# Patient Record
Sex: Female | Born: 1950 | Race: Black or African American | Hispanic: No | Marital: Single | Smoking: Never smoker
Health system: Southern US, Community
[De-identification: ages and names within clinical notes are randomized; demographics above are authoritative.]

## PROBLEM LIST (undated history)

## (undated) DIAGNOSIS — Z923 Personal history of irradiation: Secondary | ICD-10-CM

## (undated) DIAGNOSIS — E7800 Pure hypercholesterolemia, unspecified: Secondary | ICD-10-CM

## (undated) DIAGNOSIS — I10 Essential (primary) hypertension: Secondary | ICD-10-CM

## (undated) DIAGNOSIS — D649 Anemia, unspecified: Secondary | ICD-10-CM

## (undated) DIAGNOSIS — M8580 Other specified disorders of bone density and structure, unspecified site: Secondary | ICD-10-CM

## (undated) DIAGNOSIS — Z803 Family history of malignant neoplasm of breast: Secondary | ICD-10-CM

## (undated) DIAGNOSIS — Z87898 Personal history of other specified conditions: Secondary | ICD-10-CM

## (undated) DIAGNOSIS — F419 Anxiety disorder, unspecified: Secondary | ICD-10-CM

## (undated) DIAGNOSIS — C801 Malignant (primary) neoplasm, unspecified: Secondary | ICD-10-CM

## (undated) DIAGNOSIS — M1990 Unspecified osteoarthritis, unspecified site: Secondary | ICD-10-CM

## (undated) DIAGNOSIS — N809 Endometriosis, unspecified: Secondary | ICD-10-CM

## (undated) DIAGNOSIS — T7840XA Allergy, unspecified, initial encounter: Secondary | ICD-10-CM

## (undated) DIAGNOSIS — K529 Noninfective gastroenteritis and colitis, unspecified: Secondary | ICD-10-CM

## (undated) DIAGNOSIS — IMO0002 Reserved for concepts with insufficient information to code with codable children: Secondary | ICD-10-CM

## (undated) DIAGNOSIS — R32 Unspecified urinary incontinence: Secondary | ICD-10-CM

## (undated) DIAGNOSIS — K219 Gastro-esophageal reflux disease without esophagitis: Secondary | ICD-10-CM

## (undated) HISTORY — DX: Endometriosis, unspecified: N80.9

## (undated) HISTORY — DX: Reserved for concepts with insufficient information to code with codable children: IMO0002

## (undated) HISTORY — DX: Gastro-esophageal reflux disease without esophagitis: K21.9

## (undated) HISTORY — DX: Noninfective gastroenteritis and colitis, unspecified: K52.9

## (undated) HISTORY — DX: Other specified disorders of bone density and structure, unspecified site: M85.80

## (undated) HISTORY — DX: Malignant (primary) neoplasm, unspecified: C80.1

## (undated) HISTORY — DX: Allergy, unspecified, initial encounter: T78.40XA

## (undated) HISTORY — DX: Anemia, unspecified: D64.9

## (undated) HISTORY — DX: Personal history of other specified conditions: Z87.898

## (undated) HISTORY — DX: Family history of malignant neoplasm of breast: Z80.3

## (undated) HISTORY — DX: Unspecified urinary incontinence: R32

## (undated) HISTORY — DX: Anxiety disorder, unspecified: F41.9

## (undated) HISTORY — PX: BILATERAL SALPINGOOPHORECTOMY: SHX1223

---

## 1978-11-11 HISTORY — PX: CERVIX LESION DESTRUCTION: SHX591

## 1979-07-13 DIAGNOSIS — Z87898 Personal history of other specified conditions: Secondary | ICD-10-CM

## 1979-07-13 HISTORY — DX: Personal history of other specified conditions: Z87.898

## 2005-06-06 ENCOUNTER — Emergency Department (HOSPITAL_COMMUNITY): Admission: EM | Admit: 2005-06-06 | Discharge: 2005-06-06 | Payer: Self-pay | Admitting: Emergency Medicine

## 2006-01-01 ENCOUNTER — Encounter: Admission: RE | Admit: 2006-01-01 | Discharge: 2006-01-01 | Payer: Self-pay | Admitting: Obstetrics and Gynecology

## 2006-01-16 ENCOUNTER — Other Ambulatory Visit: Admission: RE | Admit: 2006-01-16 | Discharge: 2006-01-16 | Payer: Self-pay | Admitting: Obstetrics and Gynecology

## 2006-03-10 ENCOUNTER — Encounter: Admission: RE | Admit: 2006-03-10 | Discharge: 2006-03-10 | Payer: Self-pay | Admitting: Family Medicine

## 2007-08-11 ENCOUNTER — Other Ambulatory Visit: Admission: RE | Admit: 2007-08-11 | Discharge: 2007-08-11 | Payer: Self-pay | Admitting: Family Medicine

## 2007-08-17 ENCOUNTER — Encounter: Admission: RE | Admit: 2007-08-17 | Discharge: 2007-08-17 | Payer: Self-pay | Admitting: Family Medicine

## 2007-12-10 ENCOUNTER — Encounter: Admission: RE | Admit: 2007-12-10 | Discharge: 2007-12-10 | Payer: Self-pay | Admitting: Family Medicine

## 2008-09-07 ENCOUNTER — Encounter: Admission: RE | Admit: 2008-09-07 | Discharge: 2008-09-07 | Payer: Self-pay | Admitting: Obstetrics and Gynecology

## 2008-09-11 HISTORY — PX: BLADDER SUSPENSION: SHX72

## 2008-09-11 HISTORY — PX: ABDOMINAL HYSTERECTOMY: SHX81

## 2008-09-14 ENCOUNTER — Encounter: Admission: RE | Admit: 2008-09-14 | Discharge: 2008-09-14 | Payer: Self-pay | Admitting: Obstetrics and Gynecology

## 2008-09-20 ENCOUNTER — Ambulatory Visit (HOSPITAL_COMMUNITY): Admission: RE | Admit: 2008-09-20 | Discharge: 2008-09-21 | Payer: Self-pay | Admitting: Obstetrics and Gynecology

## 2008-09-20 ENCOUNTER — Encounter (INDEPENDENT_AMBULATORY_CARE_PROVIDER_SITE_OTHER): Payer: Self-pay | Admitting: Obstetrics and Gynecology

## 2008-11-17 ENCOUNTER — Encounter: Admission: RE | Admit: 2008-11-17 | Discharge: 2008-11-17 | Payer: Self-pay | Admitting: Family Medicine

## 2008-12-01 ENCOUNTER — Ambulatory Visit: Payer: Self-pay | Admitting: Diagnostic Radiology

## 2008-12-01 ENCOUNTER — Emergency Department (HOSPITAL_BASED_OUTPATIENT_CLINIC_OR_DEPARTMENT_OTHER): Admission: EM | Admit: 2008-12-01 | Discharge: 2008-12-01 | Payer: Self-pay | Admitting: Emergency Medicine

## 2009-03-17 ENCOUNTER — Encounter: Admission: RE | Admit: 2009-03-17 | Discharge: 2009-03-17 | Payer: Self-pay | Admitting: Family Medicine

## 2009-03-27 ENCOUNTER — Emergency Department (HOSPITAL_COMMUNITY): Admission: EM | Admit: 2009-03-27 | Discharge: 2009-03-27 | Payer: Self-pay | Admitting: Emergency Medicine

## 2009-12-05 ENCOUNTER — Encounter: Admission: RE | Admit: 2009-12-05 | Discharge: 2009-12-05 | Payer: Self-pay | Admitting: Family Medicine

## 2010-11-11 LAB — HM COLONOSCOPY

## 2010-11-11 LAB — HM PAP SMEAR

## 2010-12-02 ENCOUNTER — Encounter: Payer: Self-pay | Admitting: Family Medicine

## 2011-01-15 ENCOUNTER — Emergency Department (HOSPITAL_COMMUNITY)
Admission: EM | Admit: 2011-01-15 | Discharge: 2011-01-15 | Disposition: A | Discharge status: Home / Self Care | Payer: Federal, State, Local not specified - PPO | Attending: Emergency Medicine | Admitting: Emergency Medicine

## 2011-01-15 DIAGNOSIS — R197 Diarrhea, unspecified: Secondary | ICD-10-CM | POA: Insufficient documentation

## 2011-01-15 DIAGNOSIS — R112 Nausea with vomiting, unspecified: Secondary | ICD-10-CM | POA: Insufficient documentation

## 2011-01-15 DIAGNOSIS — R42 Dizziness and giddiness: Secondary | ICD-10-CM | POA: Insufficient documentation

## 2011-01-15 LAB — CBC
HCT: 36.9 % (ref 36.0–46.0)
Hemoglobin: 11.2 g/dL — ABNORMAL LOW (ref 12.0–15.0)
MCH: 25.7 pg — ABNORMAL LOW (ref 26.0–34.0)
MCHC: 30.4 g/dL (ref 30.0–36.0)
MCV: 84.6 fL (ref 78.0–100.0)
Platelets: 218 10*3/uL (ref 150–400)
RBC: 4.36 MIL/uL (ref 3.87–5.11)
RDW: 15 % (ref 11.5–15.5)
WBC: 6.5 10*3/uL (ref 4.0–10.5)

## 2011-01-15 LAB — BASIC METABOLIC PANEL
BUN: 7 mg/dL (ref 6–23)
CO2: 29 mEq/L (ref 19–32)
Calcium: 8.8 mg/dL (ref 8.4–10.5)
Chloride: 103 mEq/L (ref 96–112)
Creatinine, Ser: 0.55 mg/dL (ref 0.4–1.2)
GFR calc Af Amer: >60 mL/min (ref >60)
GFR calc non Af Amer: >60 mL/min (ref >60)
Glucose, Bld: 101 mg/dL — ABNORMAL HIGH (ref 70–99)
Potassium: 4 mEq/L (ref 3.5–5.1)
Sodium: 137 mEq/L (ref 135–145)

## 2011-02-19 LAB — COMPREHENSIVE METABOLIC PANEL
ALT: 12 U/L (ref 0–35)
AST: 20 U/L (ref 0–37)
Albumin: 3.6 g/dL (ref 3.5–5.2)
Alkaline Phosphatase: 99 U/L (ref 39–117)
BUN: 5 mg/dL — ABNORMAL LOW (ref 6–23)
CO2: 31 mEq/L (ref 19–32)
Calcium: 9.6 mg/dL (ref 8.4–10.5)
Chloride: 105 mEq/L (ref 96–112)
Creatinine, Ser: 0.64 mg/dL (ref 0.4–1.2)
GFR calc Af Amer: >60 mL/min (ref >60)
GFR calc non Af Amer: >60 mL/min (ref >60)
Glucose, Bld: 98 mg/dL (ref 70–99)
Potassium: 4.2 mEq/L (ref 3.5–5.1)
Sodium: 142 mEq/L (ref 135–145)
Total Bilirubin: 0.3 mg/dL (ref 0.3–1.2)
Total Protein: 7.3 g/dL (ref 6.0–8.3)

## 2011-02-19 LAB — CBC
HCT: 35.3 % — ABNORMAL LOW (ref 36.0–46.0)
Hemoglobin: 11.6 g/dL — ABNORMAL LOW (ref 12.0–15.0)
MCHC: 33 g/dL (ref 30.0–36.0)
MCV: 82 fL (ref 78.0–100.0)
Platelets: 217 10*3/uL (ref 150–400)
RBC: 4.31 MIL/uL (ref 3.87–5.11)
RDW: 15.1 % (ref 11.5–15.5)
WBC: 4.4 10*3/uL (ref 4.0–10.5)

## 2011-02-19 LAB — POCT CARDIAC MARKERS
CKMB, poc: <1 ng/mL — ABNORMAL LOW (ref 1.0–8.0)
Myoglobin, poc: 52.5 ng/mL (ref 12–200)
Troponin i, poc: <0.05 ng/mL (ref 0.00–0.09)

## 2011-02-19 LAB — MAGNESIUM: Magnesium: 2.2 mg/dL (ref 1.5–2.5)

## 2011-02-19 LAB — LIPASE, BLOOD: Lipase: 28 U/L (ref 11–59)

## 2011-02-25 LAB — CBC
HCT: 37.5 % (ref 36.0–46.0)
Hemoglobin: 12.1 g/dL (ref 12.0–15.0)
MCHC: 32.1 g/dL (ref 30.0–36.0)
MCV: 83 fL (ref 78.0–100.0)
Platelets: 226 10*3/uL (ref 150–400)
RBC: 4.52 MIL/uL (ref 3.87–5.11)
RDW: 14.1 % (ref 11.5–15.5)
WBC: 6.5 10*3/uL (ref 4.0–10.5)

## 2011-02-25 LAB — DIFFERENTIAL
Basophils Absolute: 0.1 10*3/uL (ref 0.0–0.1)
Basophils Relative: 1 % (ref 0–1)
Eosinophils Absolute: 0.2 10*3/uL (ref 0.0–0.7)
Eosinophils Relative: 4 % (ref 0–5)
Lymphocytes Relative: 61 % — ABNORMAL HIGH (ref 12–46)
Lymphs Abs: 3.9 10*3/uL (ref 0.7–4.0)
Monocytes Absolute: 0.5 10*3/uL (ref 0.1–1.0)
Monocytes Relative: 7 % (ref 3–12)
Neutro Abs: 1.8 10*3/uL (ref 1.7–7.7)
Neutrophils Relative %: 28 % — ABNORMAL LOW (ref 43–77)

## 2011-02-25 LAB — BASIC METABOLIC PANEL
BUN: 10 mg/dL (ref 6–23)
CO2: 32 mEq/L (ref 19–32)
Calcium: 10 mg/dL (ref 8.4–10.5)
Chloride: 100 mEq/L (ref 96–112)
Creatinine, Ser: 0.7 mg/dL (ref 0.4–1.2)
GFR calc Af Amer: >60 mL/min (ref >60)
GFR calc non Af Amer: >60 mL/min (ref >60)
Glucose, Bld: 93 mg/dL (ref 70–99)
Potassium: 3.6 mEq/L (ref 3.5–5.1)
Sodium: 141 mEq/L (ref 135–145)

## 2011-02-25 LAB — POCT CARDIAC MARKERS
CKMB, poc: <1 ng/mL — ABNORMAL LOW (ref 1.0–8.0)
CKMB, poc: <1 ng/mL — ABNORMAL LOW (ref 1.0–8.0)
Myoglobin, poc: 21.6 ng/mL (ref 12–200)
Myoglobin, poc: 28.9 ng/mL (ref 12–200)
Troponin i, poc: <0.05 ng/mL (ref 0.00–0.09)
Troponin i, poc: <0.05 ng/mL (ref 0.00–0.09)

## 2011-02-25 LAB — D-DIMER, QUANTITATIVE: D-Dimer, Quant: 0.83 ug/mL-FEU — ABNORMAL HIGH (ref 0.00–0.48)

## 2011-03-26 NOTE — Op Note (Signed)
NAME:  Margaret [name], Margaret [name] NO.:  [id number]   MEDICAL RECORD NO.:  [id number]          PATIENT TYPE:  OIB   LOCATION:  9318                          FACILITY:  [facility]   PHYSICIAN:  Randye Lobo, M.D.   DATE OF BIRTH:  1950/11/22   DATE OF PROCEDURE:  09/20/2008  DATE OF DISCHARGE:                               OPERATIVE REPORT   PREOPERATIVE DIAGNOSIS:  Incomplete uterovaginal prolapse.   POSTOPERATIVE DIAGNOSIS:  Incomplete uterovaginal prolapse.   PROCEDURE:  Laparoscopically-assisted vaginal hysterectomy with  bilateral salpingo-oophorectomy and McCall culdoplasty.   SURGEON:  Randye Lobo, MD   ASSISTANT:  Gretchen Short, PA-C   ANESTHESIA:  General endotracheal, local with 0.25 Marcaine and 0.5%  lidocaine with epinephrine 1:200,000.   ESTIMATED BLOOD LOSS:  50 mL.   URINE OUTPUT:  Quantity sufficient.   COMPLICATIONS:  None.   INDICATIONS FOR THE PROCEDURE:  The patient is a 60 year old gravida 3,  para 3 African American female, status post bilateral tubal ligation,  who presents with a 3-year history of bladder prolapse and vaginal  heaviness.  The patient has had progressive problems with this prolapse  which was documentable over the last 3 years' time.  She has done  physical therapy which has not been adequate for her and she declines a  pessary use.  She has also consulted with Dr. Alfredo Martinez, who will  be performing an anterior colporrhaphy and a possible posterior  colporrhaphy.  The plan is made to proceed with a concurrent  hysterectomy with removal of tubes and ovaries.  A plan is now made to  proceed with a laparoscopically-assisted vaginal hysterectomy with  bilateral salpingo-oophorectomy and vaginal cuff suspension after risks,  benefits, and alternatives are reviewed.   FINDINGS:  Examination under anesthesia revealed a second-degree  cystocele.  There is first-degree uterine prolapse.  There is no  appreciable rectocele.   Laparoscopy demonstrated normal uterus, tubes, and ovaries.  There were  some small adhesions which were located in the left posterior cul-de-sac  and the left pelvic sidewall where the rectosigmoid colon was attached  with filmy adhesions.  The appendix and liver and bowel surfaces  appeared to be unremarkable.  The gallbladder was not visualized.  There  was no evidence of any adhesive disease in the upper abdomen.   The uterus, tubes, and ovaries were unremarkable with the exception of  evidence of a prior bilateral tubal ligation.   SPECIMENS:  The uterus, tubes, and ovaries were sent to pathology  together.   PROCEDURE:  The patient was re-identified in the preoperative hold area.  The patient did receive IV antibiotics prior to the procedure.  She did  receive both TED hose and PAS stockings for DVT prophylaxis.   In the operating room, general endotracheal anesthesia was induced and  the patient was then placed in the dorsal lithotomy position.  The  abdomen and vagina were sterilely prepped and draped.  A Foley catheter  was placed inside the bladder and left to gravity drainage throughout  the procedure.   The patient was noted to have evidence of cervical stenosis.  A small  dilator  could not pass through the cervical os, and a sponge stick was  therefore placed in the vagina during the hysterectomy portion of the  procedure.   The procedure began by making a 1-cm umbilical incision with the  scalpel.  The 10-mm trocar was inserted directly into the peritoneal  cavity without difficulty.  The laparoscope confirmed proper placement.  A CO2 pneumoperitoneum was achieved and the patient was then placed in  the Trendelenburg position.  The 5-mm incisions were created in the  right and left lower quadrants and 5-mm trocars were placed under direct  visualization of the laparoscope.   An inspection of the pelvic and abdominal organs was performed and the  findings were as  noted above.  Sharp dissection was used to take down  the adhesions of the left pelvic sidewall and this was performed without  difficulty.   The EndoSeal instrument by [employer] was then used to perform the  laparoscopic portion of the hysterectomy procedure.  The left ureter was  identified.  The left infundibulopelvic ligament was then cauterized 3  times sequentially prior to bisecting the pedicles.  The dissection was  then carried through the left broad ligament using the same instrument.  The left round ligament was cauterized and cut.  The dissection was  carried to the anterior leaf of the left broad ligament.  The same  procedure that was performed on the left-hand side was then repeated on  the right-hand side after the right ureter was identified.  All of the  pedicle sites were examined.  There was a small amount of bleeding noted  along the anterior leaves of the broad ligament on the patient's left-  hand side and this was cauterized again with the EndoSeal instrument.  There was satisfactory hemostasis.  The rest of the procedure was then  performed vaginally.   The patient was placed in the high Trendelenburg position.  The  tenaculum was placed on the anterior and posterior cervical lips.  The  cervix was circumferentially injected with 0.5% lidocaine with 1:200,000  of epinephrine.  The cervix was then scored with a scalpel.  The  posterior cul-de-sac was identified and the Mayo scissors was used to  enter the posterior cul-de-sac sharply.  This was performed without  difficulty.  Digital exam confirmed proper entry into this location.  A  long-weighted speculum was then placed inside the posterior cul-de-sac.  The uterosacral ligaments were sequentially clamped, sharply divided,  and suture ligated with transfixing sutures of 0 Vicryl.  The bladder  was dissected off of the anterior lower uterine segment.  The bladder  pillars were then each clamped, cut, and sharply  divided, and suture  ligated with 0 Vicryl bilaterally.  The anterior cul-de-sac was entered  sharply at this time and without difficulty.  The cardinal ligaments  were then sequentially clamped, sharply divided, and suture ligated with  0 Vicryl suture.  There were 2 bites which were taken along each of the  cardinal ligaments bilaterally.  The uterine fundus was then flipped and  the remaining broad ligament tissue on each side was clamped and the  specimen was sharply excised and sent to pathology.  The remaining  pedicles were then each tied with a free tie of 0 Vicryl followed by a  suture ligature of the same.   The posterior vaginal cuff was then whipstitched with a running locked  suture of 0 Vicryl to create good hemostasis.  There was a small amount  of bleeding noted along the right uterosacral ligament and a figure-of-  eight suture of 0 Vicryl was placed in this region which provided good  hemostasis.   At this time, the case was turned over to Dr. Alfredo Martinez, who  performed the anterior colporrhaphy and cystoscopy.  Please refer to  this dictation separately.   When Dr. Sherron Monday completed his portion of the procedure, I then  performed a McCall culdoplasty.  The suture of 0 Vicryl was brought  through the posterior cul-de-sac at the 6 o'clock position, down through  the distal left uterosacral ligament, across the posterior cul-de-sac in  a pursestring fashion, down to the distal right uterosacral ligament and  then out through the cul-de-sac and into the vagina again at the 6  o'clock position.   The vaginal cuff was then closed vertically with a running locked suture  of 0 Vicryl.  The McCall culdoplasty suture was tied and there was good  elevation and support of the posterior vaginal cuff.   Dr. Sherron Monday placed a vaginal packing and I then performed final  laparoscopy.  The CO2 pneumoperitoneum was re-achieved and the pelvis  was irrigated and suctioned.   All of the pedicle sites in the vaginal  cuff were re-examined and hemostasis was excellent.  The procedure was  therefore concluded.   The lower 5-mm trocars were removed under visualization of a  laparoscope.  Pneumoperitoneum was released, and the 10-mm laparoscope  and the umbilical trocar were removed simultaneously.   All skin incisions were closed with subcuticular sutures of 4-0 Vicryl  and Dermabond was placed over the abdominal incisions.   The skin incisions were all injected locally with 0.25% Marcaine.   This concluded the patient's procedure.  There were no complications.  All needle, instrument, and sponge counts were correct.  The patient was  escorted to the recovery room in stable and awake condition.      Randye Lobo, M.D.  Electronically Signed     BES/MEDQ  D:  09/20/2008  T:  09/21/2008  Job:  [id number]   cc:   Martina Sinner, MD  Fax: [phone]

## 2011-03-26 NOTE — Op Note (Signed)
NAME:  Margaret [name], WAHLER NO.:  [id number]   MEDICAL RECORD NO.:  [id number]          PATIENT TYPE:  OIB   LOCATION:  9318                          FACILITY:  [facility]   PHYSICIAN:  Martina Sinner, MD DATE OF BIRTH:  1950-12-23   DATE OF PROCEDURE:  09/20/2008  DATE OF DISCHARGE:                               OPERATIVE REPORT   PREOPERATIVE DIAGNOSIS:  Cystocele.   POSTOPERATIVE DIAGNOSIS:  Cystocele.   SURGERY:  Cystocele repair plus graft plus cystoscopy.   [name] has mild loss of uterine support, a mild rectocele,  and a small grade 3 or large grade 2 cystocele primarily with a central  defect.  These were the findings also under anesthesia.  It was obvious  that she did not require a rectocele repair at the beginning of the case  and also at the end of the case.   SURGEON:  Martina Sinner, MD   ASSISTANT:  Randye Lobo, MD   Dr. Edward Jolly performed the laparoscopically assisted transvaginal  hysterectomy and ran the posterior cuff with a running suture.  At this  point, I entered the room.  The uterosacral ligaments had been tagged.  She had been given preoperative antibiotics.  Preoperative laboratory  tests were normal.  Her leg position was good at the beginning of the  case.   I initially instilled 20 mL of lidocaine epinephrine mixture  submucosally to help with hemostasis in my dissection plane.  I used the  lidocaine all the way to pelvic sidewall.  I placed two Allis clamps at  the vaginal cuff anteriorly after inserting the weighted vaginal  speculum and dissected sharply using my lead Allis just to the proximal  urethra.  I dissected sharply and bluntly to the pelvic sidewall  bilaterally.  I was able to easily dissect the pubocervical fascia from  the vaginal wall nearly to the white line bilaterally.   I did anterior repair with a running 2-0 Vicryl not encroaching up on  the bladder neck.  I imbricated two layers of central  defect.   The patient was then cystoscoped with a 21-French scope.  She had  excellent blue jets bilaterally.  There is no distortion of anatomy.  There was nice reduction of the cystocele.   I dissected little bit further up, almost to the white line, but I did  not break through it.  I placed a 0-Vicryl on a CT-2 needle in the  direction of the ischial spine to the ileococcygeus muscle and also near  the urethrovesical angle.  I was very happy with the sturdiness of the  four sutures.  I tailored a 4 x 7 graft to approximately 5 x 3 graft.  It somewhat cut like a trapezoid.  It sewed in very nicely, not under  tension.  I then trimmed approximately 1 cm of anterior vaginal wall  bilaterally.  I closed the anterior vaginal wall with running 0-Vicryl  on a CT-1 needle.   Following this, Dr. Edward Jolly did a Rogelio Seen culdoplasty at the level of the  vaginal cuff.  She then closed the cuff.   At the end  of the case, there was very good reduction of her cystocele  with good vaginal length.  She had very minimal posterior defect.  Hemostasis was good.   Leg position was good.  Vaginal pack with Estrace cream was inserted.   Dr. Edward Jolly then checked laparoscopically the cuff to make certain there  was no bleeding.  The patient was taken to recovery room.  Hopefully,  this procedure will reach her treatment goal.           ______________________________  Martina Sinner, MD  Electronically Signed     SAM/MEDQ  D:  09/20/2008  T:  09/21/2008  Job:  [id number]

## 2011-08-13 LAB — BASIC METABOLIC PANEL
BUN: 6
BUN: 8
CO2: 27
CO2: 29
Calcium: 8.3 — ABNORMAL LOW
Calcium: 9.2
Chloride: 103
Chloride: 104
Creatinine, Ser: 0.62
Creatinine, Ser: 0.63
GFR calc Af Amer: >60
GFR calc Af Amer: >60
GFR calc non Af Amer: >60
GFR calc non Af Amer: >60
Glucose, Bld: 134 — ABNORMAL HIGH
Glucose, Bld: 95
Potassium: 3.5
Potassium: 4.1
Sodium: 135
Sodium: 139

## 2011-08-13 LAB — URINE MICROSCOPIC-ADD ON

## 2011-08-13 LAB — CBC
HCT: 30.2 — ABNORMAL LOW
HCT: 37.5
Hemoglobin: 11.9 — ABNORMAL LOW
Hemoglobin: 9.6 — ABNORMAL LOW
MCHC: 31.8
MCHC: 31.9
MCV: 83.2
MCV: 83.3
Platelets: 191
Platelets: 248
RBC: 3.63 — ABNORMAL LOW
RBC: 4.5
RDW: 14.8
RDW: 15.3
WBC: 5.5
WBC: 7.5

## 2011-08-13 LAB — URINALYSIS, ROUTINE W REFLEX MICROSCOPIC
Glucose, UA: NEGATIVE
Hgb urine dipstick: NEGATIVE
Ketones, ur: 15 — AB
Nitrite: NEGATIVE
Protein, ur: NEGATIVE
Specific Gravity, Urine: 1.025
Urobilinogen, UA: 0.2
pH: 6

## 2011-08-13 LAB — PROTIME-INR
INR: 1
Prothrombin Time: 13.1

## 2011-08-13 LAB — RENAL FUNCTION PANEL
Albumin: 3.1 — ABNORMAL LOW
BUN: 7
CO2: 28
Calcium: 8.3 — ABNORMAL LOW
Chloride: 97
Creatinine, Ser: 0.61
GFR calc Af Amer: >60
GFR calc non Af Amer: >60
Glucose, Bld: 112 — ABNORMAL HIGH
Phosphorus: 2.2 — ABNORMAL LOW
Potassium: 3.3 — ABNORMAL LOW
Sodium: 131 — ABNORMAL LOW

## 2011-08-13 LAB — TYPE AND SCREEN
ABO/RH(D): A POS
Antibody Screen: NEGATIVE

## 2011-08-13 LAB — ABO/RH: ABO/RH(D): A POS

## 2011-08-13 LAB — APTT: aPTT: 25

## 2011-11-12 LAB — HM DEXA SCAN

## 2012-02-19 ENCOUNTER — Ambulatory Visit: Payer: Federal, State, Local not specified - PPO | Attending: Family Medicine | Admitting: Physical Therapy

## 2012-02-19 DIAGNOSIS — M6281 Muscle weakness (generalized): Secondary | ICD-10-CM | POA: Insufficient documentation

## 2012-02-19 DIAGNOSIS — M25569 Pain in unspecified knee: Secondary | ICD-10-CM | POA: Insufficient documentation

## 2012-02-19 DIAGNOSIS — IMO0001 Reserved for inherently not codable concepts without codable children: Secondary | ICD-10-CM | POA: Insufficient documentation

## 2012-02-19 DIAGNOSIS — R609 Edema, unspecified: Secondary | ICD-10-CM | POA: Insufficient documentation

## 2012-02-19 DIAGNOSIS — R262 Difficulty in walking, not elsewhere classified: Secondary | ICD-10-CM | POA: Insufficient documentation

## 2012-02-26 ENCOUNTER — Ambulatory Visit: Payer: Federal, State, Local not specified - PPO | Admitting: Physical Therapy

## 2012-02-28 ENCOUNTER — Encounter: Payer: Federal, State, Local not specified - PPO | Admitting: Physical Therapy

## 2012-03-04 ENCOUNTER — Encounter: Payer: Federal, State, Local not specified - PPO | Admitting: Physical Therapy

## 2012-03-06 ENCOUNTER — Ambulatory Visit: Payer: Federal, State, Local not specified - PPO | Admitting: Physical Therapy

## 2012-03-10 ENCOUNTER — Ambulatory Visit: Payer: Federal, State, Local not specified - PPO | Admitting: Physical Therapy

## 2012-03-12 ENCOUNTER — Ambulatory Visit: Payer: Federal, State, Local not specified - PPO | Attending: Family Medicine | Admitting: Physical Therapy

## 2012-03-12 DIAGNOSIS — IMO0001 Reserved for inherently not codable concepts without codable children: Secondary | ICD-10-CM | POA: Insufficient documentation

## 2012-03-12 DIAGNOSIS — M6281 Muscle weakness (generalized): Secondary | ICD-10-CM | POA: Insufficient documentation

## 2012-03-12 DIAGNOSIS — R262 Difficulty in walking, not elsewhere classified: Secondary | ICD-10-CM | POA: Insufficient documentation

## 2012-03-12 DIAGNOSIS — R609 Edema, unspecified: Secondary | ICD-10-CM | POA: Insufficient documentation

## 2012-03-12 DIAGNOSIS — M25569 Pain in unspecified knee: Secondary | ICD-10-CM | POA: Insufficient documentation

## 2012-03-17 ENCOUNTER — Ambulatory Visit: Payer: Federal, State, Local not specified - PPO | Admitting: Physical Therapy

## 2012-03-20 ENCOUNTER — Ambulatory Visit: Payer: Federal, State, Local not specified - PPO | Admitting: Physical Therapy

## 2012-03-20 ENCOUNTER — Encounter: Payer: Federal, State, Local not specified - PPO | Admitting: Physical Therapy

## 2012-03-24 ENCOUNTER — Encounter: Payer: Federal, State, Local not specified - PPO | Admitting: Physical Therapy

## 2012-03-25 ENCOUNTER — Encounter: Payer: Federal, State, Local not specified - PPO | Admitting: Physical Therapy

## 2012-03-27 ENCOUNTER — Encounter: Payer: Federal, State, Local not specified - PPO | Admitting: Physical Therapy

## 2012-03-27 ENCOUNTER — Ambulatory Visit: Payer: Federal, State, Local not specified - PPO | Admitting: Physical Therapy

## 2012-04-01 ENCOUNTER — Ambulatory Visit: Payer: Federal, State, Local not specified - PPO | Admitting: Physical Therapy

## 2012-04-03 ENCOUNTER — Ambulatory Visit: Payer: Federal, State, Local not specified - PPO | Admitting: Physical Therapy

## 2012-04-08 ENCOUNTER — Ambulatory Visit: Payer: Federal, State, Local not specified - PPO | Admitting: Physical Therapy

## 2012-04-10 ENCOUNTER — Ambulatory Visit: Payer: Federal, State, Local not specified - PPO | Admitting: Physical Therapy

## 2012-07-16 ENCOUNTER — Ambulatory Visit (INDEPENDENT_AMBULATORY_CARE_PROVIDER_SITE_OTHER): Payer: Federal, State, Local not specified - PPO | Admitting: Orthopedic Surgery

## 2012-07-16 ENCOUNTER — Encounter: Payer: Self-pay | Admitting: Orthopedic Surgery

## 2012-07-16 VITALS — BP 140/80 | Ht 66.5 in | Wt 172.0 lb

## 2012-07-16 DIAGNOSIS — IMO0002 Reserved for concepts with insufficient information to code with codable children: Secondary | ICD-10-CM

## 2012-07-16 DIAGNOSIS — M1712 Unilateral primary osteoarthritis, left knee: Secondary | ICD-10-CM | POA: Insufficient documentation

## 2012-07-16 DIAGNOSIS — M1710 Unilateral primary osteoarthritis, unspecified knee: Secondary | ICD-10-CM

## 2012-07-16 NOTE — Patient Instructions (Addendum)
GLUCOSAMINE  CHONDROITIN   Chondroitin; Glucosamine tablets or capsules What is this medicine? CHONDROITIN; GLUCOSAMINE is a dietary supplement. It is promoted for its ability to reduce the symptoms of osteoarthritis by maintaining healthy joint cartilage. The FDA has not approved this supplement for any medical use. This medicine may be used for other purposes; ask your health care provider or pharmacist if you have questions. What should I tell my health care provider before I take this medicine? They need to know if you have any of these conditions: -diabetes -heart disease -kidney disease -liver disease -stomach or intestinal problems -an unusual or allergic reaction to chondroitin, glucosamine, sulfonamides, other medicines, foods, dyes, or preservatives -pregnant or trying to get pregnant -breast-feeding How should I use this medicine? Take this supplement by mouth with a glass of water. Follow the directions on the package labeling, or take as directed by your health care professional. Take your medicine at regular intervals. Do not take this supplement more often than directed. Talk to your pediatrician regarding the use of this medicine in children. Special care may be needed. Overdosage: If you think you have taken too much of this medicine contact a poison control center or emergency room at once. NOTE: This medicine is only for you. Do not share this medicine with others. What if I miss a dose? If you miss a dose, take it as soon as you can. If it is almost time for your next dose, take only that dose. Do not take double or extra doses. What may interact with this medicine? Check with your doctor or healthcare professional if you are taking any of the following medications: -warfarin This list may not describe all possible interactions. Give your health care provider a list of all the medicines, herbs, non-prescription drugs, or dietary supplements you use. Also tell them if  you smoke, drink alcohol, or use illegal drugs. Some items may interact with your medicine. What should I watch for while using this medicine? Tell your doctor or healthcare professional if your symptoms do not start to get better or if they get worse. This supplement may take several weeks to work for you. If you are scheduled for any medical or dental procedure, tell your healthcare provider that you are taking this supplement. You may need to stop taking this supplement before the procedure. This supplement may affect blood sugar levels. If you have diabetes, check with your doctor or health care professional before you change your diet or the dose of your diabetic medicine. Herbal or dietary supplements are not regulated like medicines. Rigid quality control standards are not required for dietary supplements. The purity and strength of these products can vary. The safety and effect of this dietary supplement for a certain disease or illness is not well known. This product is not intended to diagnose, treat, cure or prevent any disease. The Food and Drug Administration suggests the following to help consumers protect themselves: -Always read product labels and follow directions. -Natural does not mean a product is safe for humans to take. -Look for products that include USP after the ingredient name. This means that the manufacturer followed the standards of the [place] Pharmacopoeia. -Supplements made or sold by a nationally known food or drug company are more likely to be made under tight controls. You can write to the company for more information about how the product was made. What side effects may I notice from receiving this medicine? Side effects that you should report to your  doctor or health care professional as soon as possible: -allergic reactions like skin rash, itching or hives, swelling of the face, lips, or tongue -breathing problems -constipation -diarrhea -difficulty  sleeping -drowsiness -hair loss -headache -loss of appetite -stomach pain -swelling of the ankles or feet Side effects that usually do not require medical attention (report to your doctor or health care professional if they continue or are bothersome): -gas -nausea -upset stomach This list may not describe all possible side effects. Call your doctor for medical advice about side effects. You may report side effects to FDA at 1-800-FDA-1088. Where should I keep my medicine? Keep out of the reach of children. Store at room temperature between 15 and 30 degrees C (59 and 86 degrees F) or as directed on the package label. Protect from moisture. Throw away any unused supplement after the expiration date. NOTE: This sheet is a summary. It may not cover all possible information. If you have questions about this medicine, talk to your doctor, pharmacist, or health care provider.  2012, Elsevier/Gold Standard. (04/29/2006 6:23:00 PM)

## 2012-07-16 NOTE — Progress Notes (Signed)
Subjective:     Patient ID: Margaret Margaret [name], female   DOB: [birth year], 61 y.o.   MRN: [id number]  HPI Comments: The patient has transferred her care from the [facility]. They have given excellent care of reviewed their notes. She's had an injection of cortisone she's had an x-ray she's had an MRI which showed osteoarthritis primarily of the medial compartment.    Knee Pain  Incident onset: 6 months. There was no injury mechanism. The pain is present in the Margaret [name] knee. The pain is mild. The pain has been intermittent since onset. Associated symptoms include a loss of motion. Associated symptoms comments: Swelling.   History recorded and reviewed  Review of Systems Fatigue watering of the eyes occasional tingling in the lower extremity seasonal allergies all other systems were reviewed and were normal    Objective:   Physical Exam BP 140/80  Ht 5' 6.5" (1.689 m)  Wt 172 lb (78.019 kg)  BMI 27.35 kg/m2  Her overall appearance is normal she has a fairly normal body mass index. She is oriented x3 Her mood and affect are normal She is angulatory without assistive device and there is no evidence of antalgic gait  Her upper extremities are without deformity, without contracture, without subluxation, without atrophy or tremor and the skin is normal  Her lower extremities on the right normal range. Ligaments normal. No atrophy or tremor skin normal. No deformity.  Margaret [name] knee full range of motion, moderate medial joint line tenderness, negative McMurray sign. No deformity. No joint effusion. Ligament stable. Muscle strength and muscle tone normal. Skin normal.  Distal pulses are intact there is no lymphadenopathy reflexes were excellent sensation was normal and balance was normal  X-rays and MRI including reports have been included with the patient's data from the other group and I agree with their treatment I agree with the reports she has osteoarthritis and she  does not need surgery at this time      Assessment:     Osteoarthritis Margaret [name] knee  We discussed multiple treatment options and long-term history of osteoarthritis of the Margaret [name] knee. She will take cartilage supplements keep her weight under control. She will take over-the-counter anti-inflammatories. When her knee and cannot flex past 90 she will come in for aspiration and injection. We will x-ray her sometime in the spring and then every 6-12 months as deemed necessary depending on the progression of her disease.    Plan:     As stated above

## 2012-11-11 LAB — HM MAMMOGRAPHY

## 2012-11-27 ENCOUNTER — Other Ambulatory Visit: Payer: Self-pay | Admitting: Family Medicine

## 2012-11-27 DIAGNOSIS — Z1231 Encounter for screening mammogram for malignant neoplasm of breast: Secondary | ICD-10-CM

## 2012-12-25 ENCOUNTER — Ambulatory Visit: Payer: Federal, State, Local not specified - PPO

## 2013-01-22 ENCOUNTER — Ambulatory Visit: Payer: Federal, State, Local not specified - PPO

## 2013-02-12 ENCOUNTER — Ambulatory Visit: Payer: Federal, State, Local not specified - PPO

## 2013-02-15 ENCOUNTER — Ambulatory Visit: Payer: Federal, State, Local not specified - PPO | Attending: Orthopedic Surgery | Admitting: Physical Therapy

## 2013-02-15 DIAGNOSIS — M542 Cervicalgia: Secondary | ICD-10-CM | POA: Insufficient documentation

## 2013-02-15 DIAGNOSIS — M25519 Pain in unspecified shoulder: Secondary | ICD-10-CM | POA: Insufficient documentation

## 2013-02-15 DIAGNOSIS — IMO0001 Reserved for inherently not codable concepts without codable children: Secondary | ICD-10-CM | POA: Insufficient documentation

## 2013-02-17 ENCOUNTER — Ambulatory Visit: Payer: Federal, State, Local not specified - PPO | Admitting: Physical Therapy

## 2013-02-19 ENCOUNTER — Ambulatory Visit
Admission: RE | Admit: 2013-02-19 | Discharge: 2013-02-19 | Disposition: A | Discharge status: Home / Self Care | Payer: Federal, State, Local not specified - PPO | Source: Ambulatory Visit | Attending: Family Medicine | Admitting: Family Medicine

## 2013-02-19 DIAGNOSIS — Z1231 Encounter for screening mammogram for malignant neoplasm of breast: Secondary | ICD-10-CM

## 2013-02-23 ENCOUNTER — Ambulatory Visit: Payer: Federal, State, Local not specified - PPO | Admitting: Physical Therapy

## 2013-02-24 ENCOUNTER — Ambulatory Visit: Payer: Federal, State, Local not specified - PPO | Admitting: Orthopedic Surgery

## 2013-03-02 ENCOUNTER — Ambulatory Visit: Payer: Federal, State, Local not specified - PPO | Admitting: Physical Therapy

## 2013-03-04 ENCOUNTER — Ambulatory Visit: Payer: Federal, State, Local not specified - PPO | Admitting: Orthopedic Surgery

## 2013-03-05 ENCOUNTER — Ambulatory Visit: Payer: Federal, State, Local not specified - PPO | Admitting: Physical Therapy

## 2013-03-08 ENCOUNTER — Ambulatory Visit: Payer: Federal, State, Local not specified - PPO | Admitting: Physical Therapy

## 2013-03-12 ENCOUNTER — Ambulatory Visit: Payer: Federal, State, Local not specified - PPO | Admitting: Physical Therapy

## 2013-03-17 ENCOUNTER — Ambulatory Visit: Payer: Federal, State, Local not specified - PPO | Attending: Orthopedic Surgery | Admitting: Physical Therapy

## 2013-03-17 DIAGNOSIS — M542 Cervicalgia: Secondary | ICD-10-CM | POA: Insufficient documentation

## 2013-03-17 DIAGNOSIS — M25519 Pain in unspecified shoulder: Secondary | ICD-10-CM | POA: Insufficient documentation

## 2013-03-17 DIAGNOSIS — IMO0001 Reserved for inherently not codable concepts without codable children: Secondary | ICD-10-CM | POA: Insufficient documentation

## 2013-03-22 ENCOUNTER — Ambulatory Visit: Payer: Federal, State, Local not specified - PPO | Admitting: Physical Therapy

## 2013-03-23 ENCOUNTER — Ambulatory Visit (INDEPENDENT_AMBULATORY_CARE_PROVIDER_SITE_OTHER): Payer: Federal, State, Local not specified - PPO

## 2013-03-23 ENCOUNTER — Encounter: Payer: Self-pay | Admitting: Orthopedic Surgery

## 2013-03-23 ENCOUNTER — Ambulatory Visit (INDEPENDENT_AMBULATORY_CARE_PROVIDER_SITE_OTHER): Payer: Federal, State, Local not specified - PPO | Admitting: Orthopedic Surgery

## 2013-03-23 VITALS — BP 138/68 | Ht 66.5 in | Wt 172.0 lb

## 2013-03-23 DIAGNOSIS — M25562 Pain in left knee: Secondary | ICD-10-CM

## 2013-03-23 DIAGNOSIS — M1710 Unilateral primary osteoarthritis, unspecified knee: Secondary | ICD-10-CM

## 2013-03-23 DIAGNOSIS — M25569 Pain in unspecified knee: Secondary | ICD-10-CM

## 2013-03-23 DIAGNOSIS — IMO0002 Reserved for concepts with insufficient information to code with codable children: Secondary | ICD-10-CM

## 2013-03-23 DIAGNOSIS — M1712 Unilateral primary osteoarthritis, left knee: Secondary | ICD-10-CM

## 2013-03-23 NOTE — Progress Notes (Signed)
Patient ID: Margaret [name], female   DOB: [birth year], 62 y.o.   MRN: [id number] Chief Complaint  Patient presents with  . Follow-up    7 month recheck left knee    Followup x-ray status post evaluation for left knee with MRI and x-rays which showed mild early degenerative arthrosis and some subchondral bone edema in the proximal tibia of the left knee  The patient is doing well has some mild anterior knee pain bilaterally is questioning whether she can take supplements and or needs medication  She denies any GI reflux type disease does complain of some occasional constipation  General appearance is normal, the patient is alert and oriented x3 with normal mood and affect. BP 138/68  Ht 5' 6.5" (1.689 m)  Wt 172 lb (78.019 kg)  BMI 27.35 kg/m2  She is ambulatory without assistive device there is no limp her right knee shows some mild tenderness over the patellar tendon with no joint effusion full range of motion stability tests are normal muscle strength and tone are normal as well  Left knee looks normal as well she does have some tenderness over the patellar tendon no crepitance on the patellofemoral range of motion test. Overall knee range of motion is normal stability tests normal strength normal  X-ray left knee shows mild degenerative change over the medial compartment otherwise normal  Impression osteoarthritis plan over-the-counter medication at this time Aleve should be fine. We recommended glucosamine and chondroitin as well  Followup in a year for x-ray

## 2013-03-23 NOTE — Patient Instructions (Signed)
Chondroitin and glucosamin supplements   Over the counter aleve for pain

## 2013-03-29 ENCOUNTER — Ambulatory Visit: Payer: Federal, State, Local not specified - PPO | Admitting: Physical Therapy

## 2013-04-02 ENCOUNTER — Ambulatory Visit: Payer: Federal, State, Local not specified - PPO | Admitting: Physical Therapy

## 2013-05-07 ENCOUNTER — Encounter: Payer: Self-pay | Admitting: Obstetrics and Gynecology

## 2013-05-07 ENCOUNTER — Ambulatory Visit (INDEPENDENT_AMBULATORY_CARE_PROVIDER_SITE_OTHER): Payer: Federal, State, Local not specified - PPO | Admitting: Obstetrics and Gynecology

## 2013-05-07 VITALS — BP 122/84 | HR 68 | Ht 66.0 in | Wt 176.0 lb

## 2013-05-07 DIAGNOSIS — N952 Postmenopausal atrophic vaginitis: Secondary | ICD-10-CM

## 2013-05-07 NOTE — Patient Instructions (Signed)
Atrophic Vaginitis Atrophic vaginitis is a problem of low levels of estrogen in women. This problem can happen at any age. It is most common in women who have gone through menopause ("the change").  HOW WILL I KNOW IF I HAVE THIS PROBLEM? You may have:  Trouble with peeing (urinating), such as:  Going to the bathroom often.  A hard time holding your pee until you reach a bathroom.  Leaking pee.  Having pain when you pee.  Itching or a burning feeling.  Vaginal bleeding and spotting.  Pain during sex.  Dryness of the vagina.  A yellow, bad-smelling fluid (discharge) coming from the vagina. HOW WILL MY DOCTOR CHECK FOR THIS PROBLEM?  During your exam, your doctor will likely find the problem.  If there is a vaginal fluid, it may be checked for infection. HOW WILL THIS PROBLEM BE TREATED? Keep the vulvar skin as clean as possible. Moisturizers and lubricants can help with some of the symptoms. Estrogen replacement can help. There are 2 ways to take estrogen:  Systemic estrogen gets estrogen to your whole body. It takes many weeks or months before the symptoms get better.  You take an estrogen pill.  You use a skin patch. This is a patch that you put on your skin.  If you still have your uterus, your doctor may ask you to take a hormone. Talk to your doctor about the right medicine for you.  Estrogen cream.  This puts estrogen only at the part of your body where you apply it. The cream is put into the vagina or put on the vulvar skin. For some women, estrogen cream works faster than pills or the patch. CAN ALL WOMEN WITH THIS PROBLEM USE ESTROGEN? No. Women with certain types of cancer, liver problems, or problems with blood clots should not take estrogen. Your doctor can help you decide the best treatment for your symptoms. Document Released: 04/15/2008 Document Revised: 01/20/2012 Document Reviewed: 04/15/2008 [employer] Patient Information 2014 [employer].  

## 2013-05-07 NOTE — Progress Notes (Signed)
Patient ID: Margaret [name], female   DOB: [birth year], 62 y.o.   MRN: [id number]  61 y.o.   Divorced    [place]   female   [phone]   here for annual exam.   Status post TVH/BSO/TVT November 2009 with Dr. Edward Jolly doing the hysterectomy and Dr. Sherron Monday doing the anterior colporrhaphy  With a biological mesh and the TVT. Patient with vaginal pain and itching. Patient feels like there is a small pocket that is painful during intercourse.  Dr. Uvaldo Rising prescribed Estrace cream for patient. Feels breast sensitivity.  Some tinglingin the back of left left. Has a history of joint tap due to fluid on the left side.  No consistent leakage with cough, laugh or sneeze.  This is quite rare.   No pad use.  Has UTI currently, started Macrobid last week.  Feels better now.     No LMP recorded. Patient has had a hysterectomy.          Sexually active: yes  The current method of family planning is status post hysterectomy.    Exercising:  Last [facility]:  [facility] Last pap smear: History of abnormal pap:  Smoking: Alcohol: Last colonoscopy: Last Bone Density:   Last tetanus shot: Last cholesterol check:   Hgb:                Urine:   Family History  Problem Relation Age of Onset  . Arthritis    . Diabetes    . Diabetes Mother   . Hypertension Mother   . Thyroid disease Sister   . Seizures Brother   . Diabetes Brother   . Hypertension Brother   . Diabetes Brother     Patient Active Problem List   Diagnosis Date Noted  . Osteoarthritis of left knee 07/16/2012    Past Medical History  Diagnosis Date  . Anemia   . Dyspareunia   . Endometriosis   . Osteopenia   . Urinary incontinence   . Hx of abnormal Pap smear 1980's    Past Surgical History  Procedure Laterality Date  . Abdominal hysterectomy  09/2008    TVH/BSO--Dr. Edward Jolly with TVT  . Bladder suspension  09/2008    Dr. Edward Jolly  . Cervix lesion destruction  1980    hx abnormal  pap--dysplasia    Allergies: Ciprofloxacin and Sulfa antibiotics  Current Outpatient Prescriptions  Medication Sig Dispense Refill  . Cetirizine HCl (ZYRTEC PO) Take by mouth.      . Cholecalciferol (VITAMIN D PO) Take by mouth.      . estradiol (ESTRACE) 0.1 MG/GM vaginal cream Place 2 g vaginally 3 (three) times a week.      . nitrofurantoin, macrocrystal-monohydrate, (MACROBID) 100 MG capsule Take 100 mg by mouth 2 (two) times daily.       No current facility-administered medications for this visit.    ROS: Pertinent items are noted in HPI.  Social Hx:    Exam:    BP 122/84  Pulse 68  Ht 5\' 6"  (1.676 m)  Wt 176 lb (79.833 kg)  BMI 28.42 kg/m2   Wt Readings from Last 3 Encounters:  05/07/13 176 lb (79.833 kg)  03/23/13 172 lb (78.019 kg)  07/16/12 172 lb (78.019 kg)     Ht Readings from Last 3 Encounters:  05/07/13 5\' 6"  (1.676 m)  03/23/13 5' 6.5" (1.689 m)  07/16/12 5' 6.5" (1.689 m)     Neurologic: Grossly normal   Pelvic: External genitalia:  no lesions              Urethra:  normal appearing urethra with no masses, tenderness or lesions              Bartholins and Skenes: normal                 Vagina: normal appearing vagina with normal color and discharge, no lesions.  Excellent support.  No mesh or graft visible.              Cervix: absent                    Bimanual Exam:  Uterus:   Absent.  Anterior vaginal wall - outline of graft palpable, nontender to touch.                                      Adnexa:  no masses                                      Rectovaginal: Confirms                                      Anus:  normal sphincter tone, no lesions  A: Atrophic vaginitis Status post TVH Status post cystocele repair with biological graft, status post midurethral sling with permanent mesh Recent UTI.  Negative urine dip today.     P:  Continue vagina estrogen 2 grams twice a week at bedtime Discussed vaginal vitamin E suppositories twice a  week Finish abx Rx. Will follow up with Dr. Sherron Monday as well. I do not recommend removal of the graft at this time.  I believe that atrophy of the vagina is causing a significant increase in symptoms currently. Follow up in 4 - 6 weeks for recheck   An After Visit Summary was printed and given to the patient.

## 2013-05-13 ENCOUNTER — Ambulatory Visit (INDEPENDENT_AMBULATORY_CARE_PROVIDER_SITE_OTHER): Payer: Federal, State, Local not specified - PPO | Admitting: Family Medicine

## 2013-05-13 ENCOUNTER — Encounter: Payer: Self-pay | Admitting: Obstetrics and Gynecology

## 2013-05-13 VITALS — BP 180/98 | HR 96 | Temp 98.5°F | Resp 16 | Ht 66.5 in | Wt 172.0 lb

## 2013-05-13 DIAGNOSIS — L299 Pruritus, unspecified: Secondary | ICD-10-CM

## 2013-05-13 DIAGNOSIS — R209 Unspecified disturbances of skin sensation: Secondary | ICD-10-CM

## 2013-05-13 DIAGNOSIS — Z87440 Personal history of urinary (tract) infections: Secondary | ICD-10-CM

## 2013-05-13 DIAGNOSIS — D709 Neutropenia, unspecified: Secondary | ICD-10-CM

## 2013-05-13 DIAGNOSIS — R202 Paresthesia of skin: Secondary | ICD-10-CM

## 2013-05-13 LAB — POCT UA - MICROSCOPIC ONLY
Casts, Ur, LPF, POC: NEGATIVE
Crystals, Ur, HPF, POC: NEGATIVE
Mucus, UA: POSITIVE
Yeast, UA: NEGATIVE

## 2013-05-13 LAB — POCT URINALYSIS DIPSTICK
Bilirubin, UA: NEGATIVE
Blood, UA: NEGATIVE
Glucose, UA: NEGATIVE
Ketones, UA: NEGATIVE
Leukocytes, UA: NEGATIVE
Nitrite, UA: NEGATIVE
Protein, UA: NEGATIVE
Spec Grav, UA: 1.02
Urobilinogen, UA: 0.2
pH, UA: 6

## 2013-05-13 LAB — POCT CBC
Granulocyte percent: 32.3 %G — AB (ref 37–80)
HCT, POC: 41.9 % (ref 37.7–47.9)
Hemoglobin: 12.8 g/dL (ref 12.2–16.2)
Lymph, poc: 1.5 (ref 0.6–3.4)
MCH, POC: 26.4 pg — AB (ref 27–31.2)
MCHC: 30.5 g/dL — AB (ref 31.8–35.4)
MCV: 86.4 fL (ref 80–97)
MID (cbc): 0.3 (ref 0–0.9)
MPV: 8.3 fL (ref 0–99.8)
POC Granulocyte: 0.8 — AB (ref 2–6.9)
POC LYMPH PERCENT: 56.6 %L — AB (ref 10–50)
POC MID %: 11.1 %M (ref 0–12)
Platelet Count, POC: 228 10*3/uL (ref 142–424)
RBC: 4.85 M/uL (ref 4.04–5.48)
RDW, POC: 15.8 %
WBC: 2.6 10*3/uL — AB (ref 4.6–10.2)

## 2013-05-13 LAB — POCT SEDIMENTATION RATE: POCT SED RATE: 25 mm/hr — AB (ref 0–22)

## 2013-05-13 MED ORDER — CYPROHEPTADINE HCL 4 MG PO TABS: ORAL_TABLET | ORAL | Status: DC

## 2013-05-13 NOTE — Addendum Note (Signed)
Addended by: Sheppard Plumber A on: 05/13/2013 02:03 PM   Modules accepted: Orders

## 2013-05-13 NOTE — Patient Instructions (Signed)
Try Periactin if needed for itching and burning sensation.  Repeat CBC in one week  Return if worse or not improving

## 2013-05-13 NOTE — Progress Notes (Signed)
Subjective: The last several days patient has had a burning and itching sensation in her back and trunk. It has gone down into a feeling that way into her thighs. Little discomfort in her left leg. She feels like she has a rash but she doesn't. She's worried about shingles. She has not been under any undue stresses. Has not had this before. She has not any regular medicines changed lately. She took a hydroxyzine last night and felt like it made things worse.  Objective: Pleasant lady in no major distress. Skin of trunk looks fine. Chest is clear. Heart regular without murmurs. Abdomen soft nontender. No back tenderness. Back full range of motion. Straight leg raise test negative.  Assessment: Back itching and burning sensation, unexplained paresthesias Recent UTI  Plan: Check a UA, CBC, sedimentation rate, C. met on her. I do not know what is going on.  Results for orders placed in visit on 05/13/13  POCT CBC      Result Value Range   WBC 2.6 (*) 4.6 - 10.2 K/uL   Lymph, poc 1.5  0.6 - 3.4   POC LYMPH PERCENT 56.6 (*) 10 - 50 %L   MID (cbc) 0.3  0 - 0.9   POC MID % 11.1  0 - 12 %M   POC Granulocyte 0.8 (*) 2 - 6.9   Granulocyte percent 32.3 (*) 37 - 80 %G   RBC 4.85  4.04 - 5.48 M/uL   Hemoglobin 12.8  12.2 - 16.2 g/dL   HCT, POC 16.1  09.6 - 47.9 %   MCV 86.4  80 - 97 fL   MCH, POC 26.4 (*) 27 - 31.2 pg   MCHC 30.5 (*) 31.8 - 35.4 g/dL   RDW, POC 04.5     Platelet Count, POC 228  142 - 424 K/uL   MPV 8.3  0 - 99.8 fL  POCT UA - MICROSCOPIC ONLY      Result Value Range   WBC, Ur, HPF, POC 2-5     RBC, urine, microscopic 0-1     Bacteria, U Microscopic 1+     Mucus, UA positive     Epithelial cells, urine per micros 3-6     Crystals, Ur, HPF, POC neg     Casts, Ur, LPF, POC neg     Yeast, UA neg    POCT URINALYSIS DIPSTICK      Result Value Range   Color, UA yellow     Clarity, UA clear     Glucose, UA neg     Bilirubin, UA neg     Ketones, UA neg     Spec Grav, UA  1.020     Blood, UA neg     pH, UA 6.0     Protein, UA neg     Urobilinogen, UA 0.2     Nitrite, UA neg     Leukocytes, UA Negative     I do not know why she had a little bit of a low white blood count. Will repeat that in one week.  Try Periactin for the itching and burning sensation  Return if worse

## 2013-05-14 LAB — COMPREHENSIVE METABOLIC PANEL
ALT: 14 U/L (ref 0–35)
AST: 19 U/L (ref 0–37)
Albumin: 4.2 g/dL (ref 3.5–5.2)
Alkaline Phosphatase: 117 U/L (ref 39–117)
BUN: 8 mg/dL (ref 6–23)
CO2: 30 mEq/L (ref 19–32)
Calcium: 9.2 mg/dL (ref 8.4–10.5)
Chloride: 101 mEq/L (ref 96–112)
Creat: 0.69 mg/dL (ref 0.50–1.10)
Glucose, Bld: 90 mg/dL (ref 70–99)
Potassium: 3.8 mEq/L (ref 3.5–5.3)
Sodium: 137 mEq/L (ref 135–145)
Total Bilirubin: 0.3 mg/dL (ref 0.3–1.2)
Total Protein: 7.5 g/dL (ref 6.0–8.3)

## 2013-05-17 ENCOUNTER — Encounter: Payer: Self-pay | Admitting: Family Medicine

## 2013-05-21 ENCOUNTER — Other Ambulatory Visit (INDEPENDENT_AMBULATORY_CARE_PROVIDER_SITE_OTHER): Payer: Federal, State, Local not specified - PPO

## 2013-05-21 DIAGNOSIS — D709 Neutropenia, unspecified: Secondary | ICD-10-CM

## 2013-05-21 LAB — CBC
HCT: 36 % (ref 36.0–46.0)
Hemoglobin: 11.8 g/dL — ABNORMAL LOW (ref 12.0–15.0)
MCH: 25.3 pg — ABNORMAL LOW (ref 26.0–34.0)
MCHC: 32.8 g/dL (ref 30.0–36.0)
MCV: 77.1 fL — ABNORMAL LOW (ref 78.0–100.0)
Platelets: 217 10*3/uL (ref 150–400)
RBC: 4.67 MIL/uL (ref 3.87–5.11)
RDW: 15.8 % — ABNORMAL HIGH (ref 11.5–15.5)
WBC: 3.2 10*3/uL — ABNORMAL LOW (ref 4.0–10.5)

## 2013-06-11 ENCOUNTER — Ambulatory Visit: Payer: Self-pay | Admitting: Obstetrics and Gynecology

## 2013-06-21 ENCOUNTER — Ambulatory Visit: Payer: Federal, State, Local not specified - PPO

## 2013-06-21 ENCOUNTER — Ambulatory Visit (INDEPENDENT_AMBULATORY_CARE_PROVIDER_SITE_OTHER): Payer: Federal, State, Local not specified - PPO | Admitting: Family Medicine

## 2013-06-21 VITALS — BP 132/74 | HR 79 | Temp 97.8°F | Resp 18 | Ht 66.0 in | Wt 174.0 lb

## 2013-06-21 DIAGNOSIS — J302 Other seasonal allergic rhinitis: Secondary | ICD-10-CM

## 2013-06-21 DIAGNOSIS — R079 Chest pain, unspecified: Secondary | ICD-10-CM

## 2013-06-21 DIAGNOSIS — J309 Allergic rhinitis, unspecified: Secondary | ICD-10-CM

## 2013-06-21 DIAGNOSIS — R0602 Shortness of breath: Secondary | ICD-10-CM

## 2013-06-21 LAB — POCT CBC
Granulocyte percent: 32.2 % — AB (ref 37–80)
HCT, POC: 37.1 % — AB (ref 37.7–47.9)
Hemoglobin: 11.5 g/dL — AB (ref 12.2–16.2)
Lymph, poc: 2.6 (ref 0.6–3.4)
MCH, POC: 26 pg — AB (ref 27–31.2)
MCHC: 31 g/dL — AB (ref 31.8–35.4)
MCV: 83.8 fL (ref 80–97)
MID (cbc): 0.3 (ref 0–0.9)
MPV: 8.1 fL (ref 0–99.8)
POC Granulocyte: 1.4 — AB (ref 2–6.9)
POC LYMPH PERCENT: 60 % — AB (ref 10–50)
POC MID %: 7.8 % (ref 0–12)
Platelet Count, POC: 230 10*3/uL (ref 142–424)
RBC: 4.43 M/uL (ref 4.04–5.48)
RDW, POC: 15.6 %
WBC: 4.3 10*3/uL — AB (ref 4.6–10.2)

## 2013-06-21 MED ORDER — IPRATROPIUM BROMIDE 0.03 % NA SOLN: 2.0000 | Freq: Two times a day (BID) | NASAL | Status: DC

## 2013-06-21 NOTE — Progress Notes (Signed)
[facility]:  Office Visit  Chief Complaint:  Chief Complaint  Patient presents with  . Chest Pain    x2 days   . Follow-up    low wbc     HPI: Margaret [name] is a 62 y.o. female who complains of  Midsubsternal CP x 2 day, labored, she was in the house, thought it was indigestion, has been constant today,  She has been feeling "weird" this past week. She has allergies and throat has been bothering her, when she goes outside she feels her chest is heavy, she feels throat closes [place].  She denies full outright SOB but does feel like it, no wheezing. No history of asthma.  Currently just takes zyrtec intermittently, has flonase but has not tried it  She had some clamminess + tension HA , localized to frontal ha Denies abdominal pain, jaw pain, numbness, weakness or tingling Denies cough , sputum, fevers, chills, URI sxs Has had acid reflux in the past She has taken asa and aleve.  She deneis HTN, XOL, DM, she denies smoking   Past Medical History  Diagnosis Date  . Anemia   . Dyspareunia   . Endometriosis   . Osteopenia   . Urinary incontinence   . Hx of abnormal Pap smear 1980's  . Allergy   . Ulcer    Past Surgical History  Procedure Laterality Date  . Abdominal hysterectomy  09/2008    TVH/BSO--Dr. Edward Jolly with TVT  . Bladder suspension  09/2008    Dr. Edward Jolly  . Cervix lesion destruction  1980    hx abnormal pap--dysplasia   History   Social History  . Marital Status: Divorced    Spouse Name: N/A    Number of Children: N/A  . Years of Education: college   Social History Main Topics  . Smoking status: Never Smoker   . Smokeless tobacco: None  . Alcohol Use: Yes     Comment: occasional  . Drug Use: No  . Sexually Active: Yes    Birth Control/ Protection: Surgical     Comment: TAH/BSO   Other Topics Concern  . None   Social History Narrative  . None   Family History  Problem Relation Age of Onset  . Arthritis    . Diabetes     . Diabetes Mother   . Hypertension Mother   . Thyroid disease Sister   . Seizures Brother   . Diabetes Brother   . Hypertension Brother   . Diabetes Brother    Allergies  Allergen Reactions  . Ciprofloxacin   . Sulfa Antibiotics    Prior to Admission medications   Medication Sig Start Date End Date Taking? Authorizing Provider  Cetirizine HCl (ZYRTEC PO) Take by mouth.   Yes Historical Provider, MD  Cholecalciferol (VITAMIN D PO) Take by mouth.   Yes Historical Provider, MD  cyproheptadine (PERIACTIN) 4 MG tablet Take one half to one 3 times daily as needed for itching and burning sensation 05/13/13  Yes Peyton Najjar, MD  estradiol (ESTRACE) 0.1 MG/GM vaginal cream Place 2 g vaginally 3 (three) times a week.   Yes Historical Provider, MD  nitrofurantoin, macrocrystal-monohydrate, (MACROBID) 100 MG capsule Take 100 mg by mouth 2 (two) times daily.    Historical Provider, MD     ROS: The patient denies fevers, chills, night sweats, unintentional weight loss,  palpitations, wheezing, dyspnea on exertion, nausea, vomiting, abdominal pain, dysuria, hematuria, melena, numbness, weakness, or tingling.   All  other systems have been reviewed and were otherwise negative with the exception of those mentioned in the HPI and as above.    PHYSICAL EXAM: Filed Vitals:   06/21/13 1937  BP: 132/74  Pulse: 79  Temp: 97.8 F (36.6 C)  Resp: 18   Filed Vitals:   06/21/13 1937  Height: 5\' 6"  (1.676 m)  Weight: 174 lb (78.926 kg)   Body mass index is 28.1 kg/(m^2).  General: Alert, no acute distress HEENT:  Normocephalic, atraumatic, oropharynx patent. Tm nl, no sinus tenderness, + boggy nares, + nasal congestion Cardiovascular:  Regular rate and rhythm, no rubs murmurs or gallops.  No Carotid bruits, radial pulse intact. No pedal edema.  Respiratory: Clear to auscultation bilaterally.  No wheezes, rales, or rhonchi.  No cyanosis, no use of accessory musculature GI: No organomegaly,  abdomen is soft and non-tender, positive bowel sounds.  No masses. Skin: No rashes. Neurologic: Facial musculature symmetric. Psychiatric: Patient is appropriate throughout our interaction. Lymphatic: No cervical lymphadenopathy Musculoskeletal: Gait intact.   LABS: Results for orders placed in visit on 06/21/13  POCT CBC      Result Value Range   WBC 4.3 (*) 4.6 - 10.2 K/uL   Lymph, poc 2.6  0.6 - 3.4   POC LYMPH PERCENT 60.0 (*) 10 - 50 %L   MID (cbc) 0.3  0 - 0.9   POC MID % 7.8  0 - 12 %M   POC Granulocyte 1.4 (*) 2 - 6.9   Granulocyte percent 32.2 (*) 37 - 80 %G   RBC 4.43  4.04 - 5.48 M/uL   Hemoglobin 11.5 (*) 12.2 - 16.2 g/dL   HCT, POC 04.5 (*) 40.9 - 47.9 %   MCV 83.8  80 - 97 fL   MCH, POC 26.0 (*) 27 - 31.2 pg   MCHC 31.0 (*) 31.8 - 35.4 g/dL   RDW, POC 81.1     Platelet Count, POC 230  142 - 424 K/uL   MPV 8.1  0 - 99.8 fL     EKG/XRAY:   Primary read interpreted by Dr. Conley Rolls at [facility]. Normal chest  EKG NSR  ASSESSMENT/PLAN: Encounter Diagnoses  Name Primary?  . Chest pain Yes  . SOB (shortness of breath)   . Seasonal allergies   . Allergic rhinitis    CP not cardio or pulmonary related She has a hx of allergies. This may be all allergy related since she has sxs when she goes outside and/or exposed to allergen, she feels her chest gets tight and she has labored breathing I would consider eosniophilic esophagitis if she actually has difficulty swallowing but she does not.  Tropnin I pending Rx Atrovent NS for rhinorrhea, she should try her flonase first and if that does nto help then consider the atrovent Take allegra and Zyrtex PM and AM respectively I would consider a trial of singulair if the anithistamines do not work. F/u with allergist We discussed her CBC and how her numbers are looking better than they did in the past.  Go to ER prn   Damel Querry PHUONG, DO 06/21/2013 9:29 PM

## 2013-06-22 ENCOUNTER — Telehealth: Payer: Self-pay | Admitting: Family Medicine

## 2013-06-22 LAB — COMPREHENSIVE METABOLIC PANEL
ALT: 24 U/L (ref 0–35)
AST: 28 U/L (ref 0–37)
Albumin: 4.3 g/dL (ref 3.5–5.2)
Alkaline Phosphatase: 100 U/L (ref 39–117)
BUN: 11 mg/dL (ref 6–23)
CO2: 29 mEq/L (ref 19–32)
Calcium: 9.2 mg/dL (ref 8.4–10.5)
Chloride: 103 mEq/L (ref 96–112)
Creat: 0.74 mg/dL (ref 0.50–1.10)
Glucose, Bld: 78 mg/dL (ref 70–99)
Potassium: 3.8 mEq/L (ref 3.5–5.3)
Sodium: 138 mEq/L (ref 135–145)
Total Bilirubin: 0.4 mg/dL (ref 0.3–1.2)
Total Protein: 7.4 g/dL (ref 6.0–8.3)

## 2013-06-22 LAB — TROPONIN I: Troponin I: 0.02 ng/mL (ref <0.06)

## 2013-06-22 NOTE — Telephone Encounter (Signed)
Spoke with patient regardig labs. Sh is doing th esame, she went outside and had siumilar sxs. I advise her to do the trial of allegra, zyrtec and flonase consistently. See her allergist.

## 2013-07-14 ENCOUNTER — Ambulatory Visit: Payer: Federal, State, Local not specified - PPO | Admitting: Obstetrics and Gynecology

## 2013-07-26 ENCOUNTER — Other Ambulatory Visit: Payer: Self-pay | Admitting: Gastroenterology

## 2013-07-26 DIAGNOSIS — R1310 Dysphagia, unspecified: Secondary | ICD-10-CM

## 2013-07-30 ENCOUNTER — Ambulatory Visit
Admission: RE | Admit: 2013-07-30 | Discharge: 2013-07-30 | Disposition: A | Discharge status: Home / Self Care | Payer: Federal, State, Local not specified - PPO | Source: Ambulatory Visit | Attending: Gastroenterology | Admitting: Gastroenterology

## 2013-07-30 DIAGNOSIS — R1310 Dysphagia, unspecified: Secondary | ICD-10-CM

## 2013-09-02 ENCOUNTER — Other Ambulatory Visit: Payer: Self-pay | Admitting: Gastroenterology

## 2013-11-27 ENCOUNTER — Emergency Department (HOSPITAL_COMMUNITY)
Admission: EM | Admit: 2013-11-27 | Discharge: 2013-11-27 | Disposition: A | Discharge status: Home / Self Care | Payer: Federal, State, Local not specified - PPO | Attending: Emergency Medicine | Admitting: Emergency Medicine

## 2013-11-27 ENCOUNTER — Ambulatory Visit (INDEPENDENT_AMBULATORY_CARE_PROVIDER_SITE_OTHER): Payer: Federal, State, Local not specified - PPO | Admitting: Emergency Medicine

## 2013-11-27 ENCOUNTER — Emergency Department (HOSPITAL_COMMUNITY): Payer: Federal, State, Local not specified - PPO

## 2013-11-27 ENCOUNTER — Encounter (HOSPITAL_COMMUNITY): Payer: Self-pay | Admitting: Emergency Medicine

## 2013-11-27 VITALS — BP 162/62 | HR 88 | Temp 99.1°F | Resp 18 | Ht 65.5 in | Wt 172.0 lb

## 2013-11-27 DIAGNOSIS — IMO0002 Reserved for concepts with insufficient information to code with codable children: Secondary | ICD-10-CM | POA: Insufficient documentation

## 2013-11-27 DIAGNOSIS — Z8742 Personal history of other diseases of the female genital tract: Secondary | ICD-10-CM | POA: Insufficient documentation

## 2013-11-27 DIAGNOSIS — N809 Endometriosis, unspecified: Secondary | ICD-10-CM | POA: Insufficient documentation

## 2013-11-27 DIAGNOSIS — L98499 Non-pressure chronic ulcer of skin of other sites with unspecified severity: Secondary | ICD-10-CM | POA: Insufficient documentation

## 2013-11-27 DIAGNOSIS — D649 Anemia, unspecified: Secondary | ICD-10-CM | POA: Insufficient documentation

## 2013-11-27 DIAGNOSIS — M949 Disorder of cartilage, unspecified: Secondary | ICD-10-CM

## 2013-11-27 DIAGNOSIS — R079 Chest pain, unspecified: Secondary | ICD-10-CM

## 2013-11-27 DIAGNOSIS — Z882 Allergy status to sulfonamides status: Secondary | ICD-10-CM | POA: Insufficient documentation

## 2013-11-27 DIAGNOSIS — R0789 Other chest pain: Secondary | ICD-10-CM

## 2013-11-27 DIAGNOSIS — M899 Disorder of bone, unspecified: Secondary | ICD-10-CM | POA: Insufficient documentation

## 2013-11-27 DIAGNOSIS — I10 Essential (primary) hypertension: Secondary | ICD-10-CM

## 2013-11-27 DIAGNOSIS — Z881 Allergy status to other antibiotic agents status: Secondary | ICD-10-CM | POA: Insufficient documentation

## 2013-11-27 DIAGNOSIS — M25519 Pain in unspecified shoulder: Secondary | ICD-10-CM

## 2013-11-27 HISTORY — DX: Essential (primary) hypertension: I10

## 2013-11-27 LAB — URINALYSIS, ROUTINE W REFLEX MICROSCOPIC
Bilirubin Urine: NEGATIVE
Glucose, UA: NEGATIVE mg/dL
Hgb urine dipstick: NEGATIVE
Ketones, ur: NEGATIVE mg/dL
Leukocytes, UA: NEGATIVE
Nitrite: NEGATIVE
Protein, ur: NEGATIVE mg/dL
Specific Gravity, Urine: 1.006 (ref 1.005–1.030)
Urobilinogen, UA: 0.2 mg/dL (ref 0.0–1.0)
pH: 7 (ref 5.0–8.0)

## 2013-11-27 LAB — POCT I-STAT, CHEM 8
BUN: 8 mg/dL (ref 6–23)
Calcium, Ion: 1.24 mmol/L (ref 1.13–1.30)
Chloride: 103 mEq/L (ref 96–112)
Creatinine, Ser: 0.7 mg/dL (ref 0.50–1.10)
Glucose, Bld: 87 mg/dL (ref 70–99)
HCT: 38 % (ref 36.0–46.0)
Hemoglobin: 12.9 g/dL (ref 12.0–15.0)
Potassium: 4 mEq/L (ref 3.7–5.3)
Sodium: 142 mEq/L (ref 137–147)
TCO2: 28 mmol/L (ref 0–100)

## 2013-11-27 LAB — CBC WITH DIFFERENTIAL/PLATELET
Basophils Absolute: 0 10*3/uL (ref 0.0–0.1)
Basophils Relative: 0 % (ref 0–1)
Eosinophils Absolute: 0.1 10*3/uL (ref 0.0–0.7)
Eosinophils Relative: 3 % (ref 0–5)
HCT: 35.8 % — ABNORMAL LOW (ref 36.0–46.0)
Hemoglobin: 11.4 g/dL — ABNORMAL LOW (ref 12.0–15.0)
Lymphocytes Relative: 56 % — ABNORMAL HIGH (ref 12–46)
Lymphs Abs: 2 10*3/uL (ref 0.7–4.0)
MCH: 26.1 pg (ref 26.0–34.0)
MCHC: 31.8 g/dL (ref 30.0–36.0)
MCV: 82.1 fL (ref 78.0–100.0)
Monocytes Absolute: 0.4 10*3/uL (ref 0.1–1.0)
Monocytes Relative: 11 % (ref 3–12)
Neutro Abs: 1.1 10*3/uL — ABNORMAL LOW (ref 1.7–7.7)
Neutrophils Relative %: 30 % — ABNORMAL LOW (ref 43–77)
Platelets: 238 10*3/uL (ref 150–400)
RBC: 4.36 MIL/uL (ref 3.87–5.11)
RDW: 14.4 % (ref 11.5–15.5)
WBC: 3.6 10*3/uL — ABNORMAL LOW (ref 4.0–10.5)

## 2013-11-27 LAB — POCT I-STAT TROPONIN I: Troponin i, poc: 0 ng/mL (ref 0.00–0.08)

## 2013-11-27 NOTE — ED Notes (Signed)
Pt transported from [facility] with reported CP since Thursday. Pt reports a tightness in chest and SHOB . Pt took 324 of ASA today . Pt is Pain free on arrival to [facility].

## 2013-11-27 NOTE — Discharge Instructions (Signed)
Chest Pain (Nonspecific) Stop doxycycline. Contact Dr. Addison Lank in 3 days to arrange for an outpatient cardiac evaluation as soon as possible. Tell her office that you were seen here today. Return if your condition worsens for any reason. It is often hard to give a specific diagnosis for the cause of chest pain. There is always a chance that your pain could be related to something serious, such as a heart attack or a blood clot in the lungs. You need to follow up with your caregiver for further evaluation. CAUSES   Heartburn.  Pneumonia or bronchitis.  Anxiety or stress.  Inflammation around your heart (pericarditis) or lung (pleuritis or pleurisy).  A blood clot in the lung.  A collapsed lung (pneumothorax). It can develop suddenly on its own (spontaneous pneumothorax) or from injury (trauma) to the chest.  Shingles infection (herpes zoster virus). The chest wall is composed of bones, muscles, and cartilage. Any of these can be the source of the pain.  The bones can be bruised by injury.  The muscles or cartilage can be strained by coughing or overwork.  The cartilage can be affected by inflammation and become sore (costochondritis). DIAGNOSIS  Lab tests or other studies, such as X-rays, electrocardiography, stress testing, or cardiac imaging, may be needed to find the cause of your pain.  TREATMENT   Treatment depends on what may be causing your chest pain. Treatment may include:  Acid blockers for heartburn.  Anti-inflammatory medicine.  Pain medicine for inflammatory conditions.  Antibiotics if an infection is present.  You may be advised to change lifestyle habits. This includes stopping smoking and avoiding alcohol, caffeine, and chocolate.  You may be advised to keep your head raised (elevated) when sleeping. This reduces the chance of acid going backward from your stomach into your esophagus.  Most of the time, nonspecific chest pain will improve within 2 to 3 days  with rest and mild pain medicine. HOME CARE INSTRUCTIONS   If antibiotics were prescribed, take your antibiotics as directed. Finish them even if you start to feel better.  For the next few days, avoid physical activities that bring on chest pain. Continue physical activities as directed.  Do not smoke.  Avoid drinking alcohol.  Only take over-the-counter or prescription medicine for pain, discomfort, or fever as directed by your caregiver.  Follow your caregiver's suggestions for further testing if your chest pain does not go away.  Keep any follow-up appointments you made. If you do not go to an appointment, you could develop lasting (chronic) problems with pain. If there is any problem keeping an appointment, you must call to reschedule. SEEK MEDICAL CARE IF:   You think you are having problems from the medicine you are taking. Read your medicine instructions carefully.  Your chest pain does not go away, even after treatment.  You develop a rash with blisters on your chest. SEEK IMMEDIATE MEDICAL CARE IF:   You have increased chest pain or pain that spreads to your arm, neck, jaw, back, or abdomen.  You develop shortness of breath, an increasing cough, or you are coughing up blood.  You have severe back or abdominal pain, feel nauseous, or vomit.  You develop severe weakness, fainting, or chills.  You have a fever. THIS IS AN EMERGENCY. Do not wait to see if the pain will go away. Get medical help at once. Call your local emergency services (911 in [place] Do not drive yourself to the hospital. MAKE SURE YOU:   Understand  these instructions.  Will watch your condition.  Will get help right away if you are not doing well or get worse. Document Released: 08/07/2005 Document Revised: 01/20/2012 Document Reviewed: 06/02/2008 [facility] Patient Information 2014 [place].

## 2013-11-27 NOTE — Progress Notes (Addendum)
Subjective:   This chart was scribed for Margaret [name] MD by Forrestine Him, ED Scribe. This patient was seen in room Room 11 and the patient's care was started 8:48 AM. '   Patient ID: Margaret [name], female    DOB: July 30, 1951, 63 y.o.   MRN: [id number]  HPI HPI Comments: Margaret [name] is a 63 y.o. female who presents to [facility] complaining of gradual onset, intermittent, mild CP with mild radiation into her neck that initially started 2 days ago. She describes the pain as "tight", and states her first episode lasted for about 15-20 minutes. She says she experienced a second episode last night around midnight with similar symptoms. However, she states she also experienced pain into her shoulder and says it lasted a little longer. She says she took an Aleve at the onset of her symptoms with improvement for her shoulder pain. She denies any diaphoresis, SOB, nausea, or emesis at the onset of her CP episodes. She reports taking 2 baby aspirin this morning. She denies currently being a smoker. She states her mother and brother have a family history of HTN. She is currently on her second dose of doxycycline.  PCP: Dr. Cari Caraway   Review of Systems  Respiratory: Negative for shortness of breath.   Cardiovascular: Positive for chest pain.  Gastrointestinal: Negative for nausea and vomiting.    Past Medical History  Diagnosis Date  . Anemia   . Dyspareunia   . Endometriosis   . Osteopenia   . Urinary incontinence   . Hx of abnormal Pap smear 1980's  . Allergy   . Ulcer     History   Social History  . Marital Status: Divorced    Spouse Name: N/A    Number of Children: N/A  . Years of Education: college   Occupational History  . Not on file.   Social History Main Topics  . Smoking status: Never Smoker   . Smokeless tobacco: Not on file  . Alcohol Use: Yes     Comment: occasional  . Drug Use: No  . Sexual Activity: Yes    Birth Control/ Protection: Surgical   Comment: TAH/BSO   Other Topics Concern  . Not on file   Social History Narrative  . No narrative on file    Past Surgical History  Procedure Laterality Date  . Abdominal hysterectomy  09/2008    TVH/BSO--Dr. Quincy Simmonds with TVT  . Bladder suspension  09/2008    Dr. Quincy Simmonds  . Cervix lesion destruction  1980    hx abnormal pap--dysplasia    Triage Vitals: BP 162/62  Pulse 88  Temp(Src) 99.1 F (37.3 C) (Oral)  Resp 18  Ht 5' 5.5" (1.664 m)  Wt 172 lb (78.019 kg)  BMI 28.18 kg/m2  SpO2 99%   Objective:   Physical Exam CONSTITUTIONAL: Well developed/well nourished HEAD: Normocephalic/atraumatic EYES: EOMI/PERRL ENMT: Mucous membranes moist NECK: supple no meningeal signs SPINE:entire spine nontender CV: S1/S2 noted, no murmurs/rubs/gallops noted LUNGS: Lungs are clear to auscultation bilaterally, no apparent distress ABDOMEN: soft, nontender, no rebound or guarding GU:no cva tenderness NEURO: Pt is awake/alert, moves all extremitiesx4 EXTREMITIES: pulses normal, full ROM SKIN: warm, color normal PSYCH: no abnormalities of mood noted   EKG normal sinus rhythm there is about 1 mm of ST elevation in aVL very similar from previous Assessment & Plan:  Patient having intermittent episodes of chest tightness. This could be GI related since she is on doxycycline. The computer reading on her EKG  is normal but there is about 1 mm of ST elevation in aVL and her pressure is elevated in a  woman greater than  60.  (2) 81mg  Aspirin given to pt at 9 am

## 2013-11-27 NOTE — ED Provider Notes (Signed)
CSN: [id number]     Arrival date & time 11/27/13  1761 History   First MD Initiated Contact with Patient 11/27/13 1000     Chief Complaint  Patient presents with  . Chest Pain   (Consider location/radiation/quality/duration/timing/severity/associated sxs/prior Treatment) HPI Presents with anterior chest pressure onset 2 days ago. Initial episode lasted 10 minutes. She also had an episode yesterday lasting approximate 10 minutes at 2 AM today the episode awakened her from sleep. She treated herself with 2 baby aspirin with posturally. Presently discomfort is minimal. Nothing makes discomfort worse.exertional does not affect the discomfort. Symptoms improved after treatment she treated herself with 2 baby aspirins this morning no associated shortness of breath nausea or sweatiness. She was seen at [facility] urgent care center this morning sent here for further evaluation. Past Medical History  Diagnosis Date  . Anemia   . Dyspareunia   . Endometriosis   . Osteopenia   . Urinary incontinence   . Hx of abnormal Pap smear 1980's  . Allergy   . Ulcer    Past Surgical History  Procedure Laterality Date  . Abdominal hysterectomy  09/2008    TVH/BSO--Dr. Quincy Simmonds with TVT  . Bladder suspension  09/2008    Dr. Quincy Simmonds  . Cervix lesion destruction  1980    hx abnormal pap--dysplasia   Family History  Problem Relation Age of Onset  . Arthritis    . Diabetes    . Diabetes Mother   . Hypertension Mother   . Thyroid disease Sister   . Seizures Brother   . Diabetes Brother   . Hypertension Brother   . Diabetes Brother    History  Substance Use Topics  . Smoking status: Never Smoker   . Smokeless tobacco: Not on file  . Alcohol Use: Yes     Comment: occasional   OB History   Grav Para Term Preterm Abortions TAB SAB Ect Mult Living   3 3 3       3      Review of Systems  Constitutional: Negative.   HENT: Negative.   Respiratory: Negative.   Cardiovascular: Positive for chest pain.   Gastrointestinal: Negative.   Genitourinary: Positive for dysuria.       Patient had dysuria several days ago for which he was placed on doxycycline. Dysuria has resolved.  Musculoskeletal: Negative.   Skin: Negative.   Neurological: Negative.   Psychiatric/Behavioral: Negative.   All other systems reviewed and are negative.    Allergies  Ciprofloxacin and Sulfa antibiotics  Home Medications   Current Outpatient Rx  Name  Route  Sig  Dispense  Refill  . cetirizine (ZYRTEC) 10 MG tablet   Oral   Take 10 mg by mouth daily.         . Cholecalciferol (VITAMIN D) 2000 UNITS CAPS   Oral   Take 4,000 Units by mouth daily.         . cyproheptadine (PERIACTIN) 4 MG tablet      Take one half to one 3 times daily as needed for itching and burning sensation   30 tablet   0   . doxycycline (VIBRA-TABS) 100 MG tablet   Oral   Take 100 mg by mouth 2 (two) times daily. For 10 days. Started 11/19/13         . ipratropium (ATROVENT) 0.03 % nasal spray   Nasal   Place 2 sprays into the nose every 12 (twelve) hours.   30 mL   2   .  meloxicam (MOBIC) 7.5 MG tablet   Oral   Take 7.5 mg by mouth daily.          BP 168/95  Pulse 76  Temp(Src) 98.3 F (36.8 C) (Oral)  Resp 11  SpO2 100% Physical Exam  Nursing note and vitals reviewed. Constitutional: She appears well-developed and well-nourished.  HENT:  Head: Normocephalic and atraumatic.  Eyes: Conjunctivae are normal. Pupils are equal, round, and reactive to light.  Neck: Neck supple. No tracheal deviation present. No thyromegaly present.  Cardiovascular: Normal rate and regular rhythm.   No murmur heard. Pulmonary/Chest: Effort normal and breath sounds normal.  Abdominal: Soft. Bowel sounds are normal. She exhibits no distension. There is no tenderness.  Musculoskeletal: Normal range of motion. She exhibits no edema and no tenderness.  Neurological: She is alert. Coordination normal.  Skin: Skin is warm and dry.  No rash noted.  Psychiatric: She has a normal mood and affect.    ED Course  Procedures (including critical care time) Labs Review Labs Reviewed  CBC WITH DIFFERENTIAL  TROPONIN I  URINALYSIS, ROUTINE W REFLEX MICROSCOPIC   Imaging Review No results found.  EKG Interpretation   None       Date: 11/27/2013  Rate: 70  Rhythm: normal sinus rhythm  QRS Axis: normal  Intervals: normal  ST/T Wave abnormalities: early repolarization  Conduction Disutrbances:none  Narrative Interpretation:   Old EKG Reviewed: No significant change from 06/21/2013 interpreted by me Chest xray viewed by me Results for orders placed during the hospital encounter of 11/27/13  CBC WITH DIFFERENTIAL      Result Value Range   WBC 3.6 (*) 4.0 - 10.5 K/uL   RBC 4.36  3.87 - 5.11 MIL/uL   Hemoglobin 11.4 (*) 12.0 - 15.0 g/dL   HCT 35.8 (*) 36.0 - 46.0 %   MCV 82.1  78.0 - 100.0 fL   MCH 26.1  26.0 - 34.0 pg   MCHC 31.8  30.0 - 36.0 g/dL   RDW 14.4  11.5 - 15.5 %   Platelets 238  150 - 400 K/uL   Neutrophils Relative % 30 (*) 43 - 77 %   Neutro Abs 1.1 (*) 1.7 - 7.7 K/uL   Lymphocytes Relative 56 (*) 12 - 46 %   Lymphs Abs 2.0  0.7 - 4.0 K/uL   Monocytes Relative 11  3 - 12 %   Monocytes Absolute 0.4  0.1 - 1.0 K/uL   Eosinophils Relative 3  0 - 5 %   Eosinophils Absolute 0.1  0.0 - 0.7 K/uL   Basophils Relative 0  0 - 1 %   Basophils Absolute 0.0  0.0 - 0.1 K/uL  URINALYSIS, ROUTINE W REFLEX MICROSCOPIC      Result Value Range   Color, Urine YELLOW  YELLOW   APPearance CLEAR  CLEAR   Specific Gravity, Urine 1.006  1.005 - 1.030   pH 7.0  5.0 - 8.0   Glucose, UA NEGATIVE  NEGATIVE mg/dL   Hgb urine dipstick NEGATIVE  NEGATIVE   Bilirubin Urine NEGATIVE  NEGATIVE   Ketones, ur NEGATIVE  NEGATIVE mg/dL   Protein, ur NEGATIVE  NEGATIVE mg/dL   Urobilinogen, UA 0.2  0.0 - 1.0 mg/dL   Nitrite NEGATIVE  NEGATIVE   Leukocytes, UA NEGATIVE  NEGATIVE  POCT I-STAT, CHEM 8      Result Value Range    Sodium 142  137 - 147 mEq/L   Potassium 4.0  3.7 - 5.3 mEq/L  Chloride 103  96 - 112 mEq/L   BUN 8  6 - 23 mg/dL   Creatinine, Ser 0.70  0.50 - 1.10 mg/dL   Glucose, Bld 87  70 - 99 mg/dL   Calcium, Ion 1.24  1.13 - 1.30 mmol/L   TCO2 28  0 - 100 mmol/L   Hemoglobin 12.9  12.0 - 15.0 g/dL   HCT 38.0  36.0 - 46.0 %  POCT I-STAT TROPONIN I      Result Value Range   Troponin i, poc 0.00  0.00 - 0.08 ng/mL   Comment 3            Dg Chest Portable 1 View  11/27/2013   CLINICAL DATA:  Chest pressure behind the sternum for 3 days  EXAM: PORTABLE CHEST - 1 VIEW  COMPARISON:  06/21/2013  FINDINGS: The heart size and mediastinal contours are within normal limits. Both lungs are clear. The visualized skeletal structures are unremarkable.  IMPRESSION: No active disease.   Electronically Signed   By: Skipper Cliche M.D.   On: 11/27/2013 10:58    MDM  No diagnosis found. Pain felt to be atypical for acute coronary syndrome, nonexertional, nonacute EKG, negative troponin after several hours of symptoms. We will discontinue doxycycline as urine is clear patient no longer has urinary symptoms. Patient advised contact Dr.Ncneill in 3 days for followup and outpatient cardiac workup Diagnosis atypical chest pain    Orlie Dakin, MD 11/27/13 1308

## 2013-12-03 ENCOUNTER — Other Ambulatory Visit (HOSPITAL_COMMUNITY): Payer: Self-pay | Admitting: Family Medicine

## 2013-12-03 DIAGNOSIS — E0590 Thyrotoxicosis, unspecified without thyrotoxic crisis or storm: Secondary | ICD-10-CM

## 2013-12-27 ENCOUNTER — Encounter (HOSPITAL_COMMUNITY): Payer: Federal, State, Local not specified - PPO

## 2013-12-28 ENCOUNTER — Encounter (HOSPITAL_COMMUNITY): Payer: Federal, State, Local not specified - PPO

## 2014-02-03 ENCOUNTER — Other Ambulatory Visit: Payer: Self-pay

## 2014-02-03 DIAGNOSIS — Z1231 Encounter for screening mammogram for malignant neoplasm of breast: Secondary | ICD-10-CM

## 2014-03-04 ENCOUNTER — Ambulatory Visit: Payer: Federal, State, Local not specified - PPO

## 2014-03-22 ENCOUNTER — Ambulatory Visit: Payer: Federal, State, Local not specified - PPO | Admitting: Orthopedic Surgery

## 2014-03-25 ENCOUNTER — Ambulatory Visit
Admission: RE | Admit: 2014-03-25 | Discharge: 2014-03-25 | Disposition: A | Discharge status: Home / Self Care | Payer: Federal, State, Local not specified - PPO | Source: Ambulatory Visit

## 2014-03-25 ENCOUNTER — Encounter (INDEPENDENT_AMBULATORY_CARE_PROVIDER_SITE_OTHER): Payer: Self-pay

## 2014-03-25 DIAGNOSIS — Z1231 Encounter for screening mammogram for malignant neoplasm of breast: Secondary | ICD-10-CM

## 2014-03-29 ENCOUNTER — Other Ambulatory Visit: Payer: Self-pay | Admitting: Family Medicine

## 2014-03-29 DIAGNOSIS — R928 Other abnormal and inconclusive findings on diagnostic imaging of breast: Secondary | ICD-10-CM

## 2014-03-31 ENCOUNTER — Ambulatory Visit: Payer: Federal, State, Local not specified - PPO | Admitting: Orthopedic Surgery

## 2014-04-12 ENCOUNTER — Ambulatory Visit (INDEPENDENT_AMBULATORY_CARE_PROVIDER_SITE_OTHER): Payer: Federal, State, Local not specified - PPO

## 2014-04-12 ENCOUNTER — Encounter: Payer: Self-pay | Admitting: Orthopedic Surgery

## 2014-04-12 ENCOUNTER — Ambulatory Visit (INDEPENDENT_AMBULATORY_CARE_PROVIDER_SITE_OTHER): Payer: Federal, State, Local not specified - PPO | Admitting: Orthopedic Surgery

## 2014-04-12 VITALS — BP 172/93 | Ht 65.5 in | Wt 175.0 lb

## 2014-04-12 DIAGNOSIS — M549 Dorsalgia, unspecified: Secondary | ICD-10-CM

## 2014-04-12 DIAGNOSIS — M129 Arthropathy, unspecified: Secondary | ICD-10-CM

## 2014-04-12 DIAGNOSIS — M25569 Pain in unspecified knee: Secondary | ICD-10-CM | POA: Insufficient documentation

## 2014-04-12 DIAGNOSIS — M1990 Unspecified osteoarthritis, unspecified site: Secondary | ICD-10-CM | POA: Insufficient documentation

## 2014-04-12 MED ORDER — DICLOFENAC POTASSIUM 50 MG PO TABS: 50.0000 mg | ORAL_TABLET | Freq: Two times a day (BID) | ORAL | Status: DC

## 2014-04-12 NOTE — Patient Instructions (Signed)
Call to arrange therapy 

## 2014-04-12 NOTE — Progress Notes (Signed)
Patient ID: Margaret [name], female   DOB: Mar 04, 1951, 63 y.o.   MRN: [id number]  Chief Complaint  Patient presents with  . Follow-up    one year follow up left knee osteoarthritis    63-year-old female for reevaluation left knee pain and arthritis after x-rays show degenerative changes and MRI shows no meniscal tear  She is having some discomfort in her left knee at this time which is diffuse. She also has tingling and numbness in the anterior compartment of her left leg with radiating pain from her knee up towards her hip she denies any back pain. She says she sits a lot at work and it hurts and aches in the knee after long periods of sitting.  She denies any weakness in the limb at this time. There are no red flags on review of systems  BP 172/93  Ht 5' 5.5" (1.664 m)  Wt 175 lb (79.379 kg)  BMI 28.67 kg/m2 General appearance is normal, the patient is alert and oriented x3 with normal mood and affect. She still ambulates without assistive devices  Mild medial joint line tenderness in the left knee no effusion full range of motion ligaments are stable skin is warm dry and intact distal pulses are normal shows good reflexes at the ankle and knee straight leg raise is negative she has tenderness in her lower back and in the left buttock  Balance is normal  Impression.dx Encounter Diagnoses  Name Primary?  . Knee pain   . Arthritis   . Back pain Yes   New-onset back pain continued knee pain  Pertinent physical therapy for her back come back in 6 weeks  Recommend anti-inflammatories for her knee milligrams twice day diclofenac.  Meds ordered this encounter  Medications  . diclofenac (CATAFLAM) 50 MG tablet    Sig: Take 1 tablet (50 mg total) by mouth 2 (two) times daily.    Dispense:  90 tablet    Refill:  3    Orders Placed This Encounter  Procedures  . DG Knee AP/LAT W/Sunrise Left    Standing Status: Future     Number of Occurrences: 1     Standing Expiration  Date: 06/13/2015    Order Specific Question:  Reason for Exam (SYMPTOM  OR DIAGNOSIS REQUIRED)    Answer:  knee pain    Order Specific Question:  Is the patient pregnant?    Answer:  No    Order Specific Question:  Preferred imaging location?    Answer:  External  . Ambulatory referral to Physical Therapy    Referral Priority:  Routine    Referral Type:  Physical Medicine    Referral Reason:  Specialty Services Required    Requested Specialty:  Physical Therapy    Number of Visits Requested:  1

## 2014-04-22 ENCOUNTER — Ambulatory Visit: Payer: Federal, State, Local not specified - PPO | Attending: Orthopedic Surgery | Admitting: Physical Therapy

## 2014-04-22 DIAGNOSIS — M545 Low back pain, unspecified: Secondary | ICD-10-CM | POA: Insufficient documentation

## 2014-04-22 DIAGNOSIS — IMO0001 Reserved for inherently not codable concepts without codable children: Secondary | ICD-10-CM | POA: Insufficient documentation

## 2014-04-27 ENCOUNTER — Ambulatory Visit: Payer: Federal, State, Local not specified - PPO | Admitting: Physical Therapy

## 2014-04-28 ENCOUNTER — Encounter (INDEPENDENT_AMBULATORY_CARE_PROVIDER_SITE_OTHER): Payer: Self-pay

## 2014-04-28 ENCOUNTER — Ambulatory Visit
Admission: RE | Admit: 2014-04-28 | Discharge: 2014-04-28 | Disposition: A | Discharge status: Home / Self Care | Payer: Federal, State, Local not specified - PPO | Source: Ambulatory Visit | Attending: Family Medicine | Admitting: Family Medicine

## 2014-04-28 DIAGNOSIS — R928 Other abnormal and inconclusive findings on diagnostic imaging of breast: Secondary | ICD-10-CM

## 2014-05-02 ENCOUNTER — Ambulatory Visit: Payer: Federal, State, Local not specified - PPO | Admitting: Physical Therapy

## 2014-05-02 ENCOUNTER — Encounter: Payer: Federal, State, Local not specified - PPO | Admitting: Physical Therapy

## 2014-05-04 ENCOUNTER — Ambulatory Visit: Payer: Federal, State, Local not specified - PPO | Admitting: Physical Therapy

## 2014-05-06 ENCOUNTER — Encounter: Payer: Federal, State, Local not specified - PPO | Admitting: Physical Therapy

## 2014-05-06 ENCOUNTER — Ambulatory Visit: Payer: Federal, State, Local not specified - PPO | Admitting: Physical Therapy

## 2014-05-10 ENCOUNTER — Ambulatory Visit: Payer: Federal, State, Local not specified - PPO | Admitting: Physical Therapy

## 2014-05-17 ENCOUNTER — Ambulatory Visit: Payer: Federal, State, Local not specified - PPO | Attending: Orthopedic Surgery | Admitting: Physical Therapy

## 2014-05-17 DIAGNOSIS — IMO0001 Reserved for inherently not codable concepts without codable children: Secondary | ICD-10-CM | POA: Insufficient documentation

## 2014-05-17 DIAGNOSIS — M545 Low back pain, unspecified: Secondary | ICD-10-CM | POA: Insufficient documentation

## 2014-06-02 ENCOUNTER — Ambulatory Visit (INDEPENDENT_AMBULATORY_CARE_PROVIDER_SITE_OTHER): Payer: Federal, State, Local not specified - PPO | Admitting: Orthopedic Surgery

## 2014-06-02 VITALS — BP 152/87 | Ht 65.5 in | Wt 175.0 lb

## 2014-06-02 DIAGNOSIS — M545 Low back pain, unspecified: Secondary | ICD-10-CM

## 2014-06-02 DIAGNOSIS — M25569 Pain in unspecified knee: Secondary | ICD-10-CM

## 2014-06-02 DIAGNOSIS — M129 Arthropathy, unspecified: Secondary | ICD-10-CM

## 2014-06-02 DIAGNOSIS — M1990 Unspecified osteoarthritis, unspecified site: Secondary | ICD-10-CM

## 2014-06-02 DIAGNOSIS — M25562 Pain in left knee: Secondary | ICD-10-CM

## 2014-06-02 MED ORDER — OMEPRAZOLE 20 MG PO CPDR: 20.0000 mg | DELAYED_RELEASE_CAPSULE | Freq: Every day | ORAL | Status: DC

## 2014-06-02 NOTE — Progress Notes (Signed)
Subjective:     Patient ID: Margaret [name], female   DOB: [birth year], 63 y.o.   MRN: [id number]  HPI Chief Complaint  Patient presents with  . Follow-up    recheck back following therapy   The patient in physical therapy her lumbar spine and that seems to have improved along with her exercises at home. She finally got her chair to help with her back pain and it would be beneficial for her to get ergonomic keyboard and keyboard tray.  We discussed the ongoing problems with her knee she's had an injection, she's had anti-inflammatories. She's concerned about her history of ulcer long time ago she's not on any proton pump inhibitor she is on diclofenac.  Review of Systems Nothing other than stated    Objective:   Physical Exam No new findings on physical exam. 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

## 2014-06-02 NOTE — Patient Instructions (Signed)
Medication has been sent to your pharmacy Call us when you are ready to start series of injections (orthovisc)

## 2014-07-25 ENCOUNTER — Telehealth: Payer: Self-pay | Admitting: Orthopedic Surgery

## 2014-07-25 NOTE — Telephone Encounter (Signed)
ADVISED PATIENT, SHE WOULD LIKE TO SCHEDULE APPOINTMENT TO DISCUSS ORTHOVISC BEFORE I ORDER, PLEASE CALL HER WITH APPOINTMENT

## 2014-07-25 NOTE — Telephone Encounter (Signed)
Routing to dr harrison 

## 2014-07-25 NOTE — Telephone Encounter (Signed)
When she comes back from [place] she will call us if the knee is still hurting and we order Orthovisc injection       This is what I saw on her on the last note so  if she still having difficulty  order Orthovisc for her

## 2014-07-25 NOTE — Telephone Encounter (Signed)
Margaret [name] says for the last week her left knee has been hurting worse.  She is asking if you need to see her or do you think a knee sleeve or knee brace would help?  Please advise

## 2014-07-27 NOTE — Telephone Encounter (Signed)
Left a message to call to schedule appointment

## 2014-08-11 NOTE — Telephone Encounter (Signed)
No response from patient to messages left to offer appointment.

## 2014-09-12 ENCOUNTER — Encounter: Payer: Self-pay | Admitting: Orthopedic Surgery

## 2014-09-16 ENCOUNTER — Telehealth: Payer: Self-pay

## 2014-09-16 NOTE — Telephone Encounter (Signed)
Pt states she is having vaginal pain that has been going on for about a week and a half. She has set up an appt for 11/19 but canceled it due to her pain increasing. Pt would like a nurse to call her.

## 2014-09-16 NOTE — Telephone Encounter (Signed)
Spoke with patient. Patient states that she is having vaginal itching and irritation that is painful. States that it has progressively gotten worse over the last week. Patient has tried OTC Monistat with slight relief. Advised patient will need to be seen for evaluation. Patient is agreeable. Patient is agreeable to seeing a NP if Dr.Silva is available in the office if anything is needed. Appointment scheduled for Monday 11/9 at 2:30pm with Regina Eck CNM. Patient is agreeable to date and time. Advised patient to use OTC hydrocortisone ointment and Aveeno Sitz bath for relief of symptoms until appointment. Patient is agreeable. Patient will call back if she would like an earlier appointment on Monday.  Routing to provider for final review. Patient agreeable to disposition. Will close encounter

## 2014-09-16 NOTE — Telephone Encounter (Signed)
Spoke with patient. Patient states that she thought she had a yeast infection which she treated with OTC Monistat. Patient had slight relief with treatment. Patient's phone disconnected. Returned call to patient. No answer. Left message to call [place] at [phone].

## 2014-09-19 ENCOUNTER — Encounter: Payer: Self-pay | Admitting: Certified Nurse Midwife

## 2014-09-19 ENCOUNTER — Ambulatory Visit: Payer: Federal, State, Local not specified - PPO | Admitting: Certified Nurse Midwife

## 2014-09-23 ENCOUNTER — Telehealth: Payer: Self-pay | Admitting: Orthopedic Surgery

## 2014-09-23 NOTE — Telephone Encounter (Signed)
Patient called, states had received our messages but had been away, offering appointment for knee; she states knee is better, as she has been going through some massage therapy.  She is asking if you can recommend a chiropractor in the [place] area, regarding her gait, which had helped her "in the past a long time ago."  Please advise.  Her ph# is [phone].

## 2014-09-26 NOTE — Telephone Encounter (Signed)
Called back to patient; left voice mail message; was going to also mention that [facility] referral line at [phone] may be able to assist, as per Dr Aline Brochure, he does not know any personally in [place],

## 2014-09-26 NOTE — Telephone Encounter (Signed)
i dont know of chiropractors in [place]   sorry

## 2014-09-29 ENCOUNTER — Ambulatory Visit: Payer: Self-pay | Admitting: Obstetrics and Gynecology

## 2014-09-29 NOTE — Telephone Encounter (Signed)
Patient called back and is aware of the message.

## 2014-10-24 ENCOUNTER — Ambulatory Visit (INDEPENDENT_AMBULATORY_CARE_PROVIDER_SITE_OTHER): Payer: Federal, State, Local not specified - PPO | Admitting: Physician Assistant

## 2014-10-24 VITALS — BP 138/84 | HR 60 | Temp 98.3°F | Resp 18

## 2014-10-24 DIAGNOSIS — M62830 Muscle spasm of back: Secondary | ICD-10-CM

## 2014-10-24 DIAGNOSIS — R079 Chest pain, unspecified: Secondary | ICD-10-CM

## 2014-10-24 MED ORDER — CYCLOBENZAPRINE HCL 10 MG PO TABS: 5.0000 mg | ORAL_TABLET | Freq: Three times a day (TID) | ORAL | Status: DC | PRN

## 2014-10-24 NOTE — Progress Notes (Signed)
Subjective:    Patient ID: Margaret [name], female    DOB: [birth year], 63 y.o.   MRN: [id number]  PCP: Cari Caraway, MD  Chief Complaint  Patient presents with  . Chest Pain    x 2days   Patient Active Problem List   Diagnosis Date Noted  . Back pain 04/12/2014  . Arthritis 04/12/2014  . Knee pain 04/12/2014  . Unspecified essential hypertension 11/27/2013  . Osteoarthritis of left knee 07/16/2012   Prior to Admission medications   Medication Sig Start Date End Date Taking? Authorizing Provider  cetirizine (ZYRTEC) 10 MG tablet Take 10 mg by mouth daily.   Yes Historical Provider, MD  Cholecalciferol (VITAMIN D) 2000 UNITS CAPS Take 4,000 Units by mouth daily.   Yes Historical Provider, MD  cyproheptadine (PERIACTIN) 4 MG tablet Take one half to one 3 times daily as needed for itching and burning sensation 05/13/13  Yes Posey Boyer, MD  hydrochlorothiazide (MICROZIDE) 12.5 MG capsule  09/19/14  Yes Historical Provider, MD  ipratropium (ATROVENT) 0.03 % nasal spray Place 2 sprays into the nose every 12 (twelve) hours. 06/21/13  Yes Thao P Le, DO   Medications, allergies, past medical history, surgical history, family history, social history and problem list reviewed and updated.  HPI  15 yof with PMH gerd and allergies presents with 5 day h/o chest pain, shoulder pain, upper back pain, and occasional lightheadedness.   She was seen by Margaret [name] in 8/14 with chest pain. CP was attributed to allergies at that time. She was then seen again in 1/15 for chest pain and sent to the ED at that time. Work up in ED was negative. She states that at one point in the past couple yrs she had a stress test which she states was negative.  Today pt presents with chest heaviness over past 5 days. CP has been intermittent over this time. It is typically worst in the morning when she wakes and improves as she gets ready for work. The CP gets worse with stretching. She has a sedentary job and has the  CP intermittently while seated at work. CP can last from 2-15 mins when it comes on, resolves on its own. She occasionally also has chest tightness with exertion over past 5 days, but not always. When she does get the pain it is substernal and 6/10.   She denies any recent leg pain or swelling. Denies any recent immobilization or hx clots.   She also c/o neck and upper back pain over past 5 days. This is worst with certain movements or positions. She cannot recall any recent increase in manual work or trauma. She was working in the yard raking 2 wks ago.   She denies any uri sx such as cough, rhinorrhea, otalgia, sore throat. She has had occasional episodes sob over past few days, but thinks she mostly gets these while she is seated and thinking about the chest pain.   Also has had occasional episodes LH over past few days. She denies syncope. Thinks that these episodes moreso occur when she is feeling the soreness in her shoulders and upper back.    Has hx gerd. Not on any current medications for it. She is not having any bad taste in her mouth at night but has had occasional heartburn sx after meals past few months.   Review of Systems No fever, chills.     Objective:   Physical Exam  Constitutional: She is oriented to person, place, and time.  She appears well-developed and well-nourished.  Non-toxic appearance. She does not have a sickly appearance. She does not appear ill. No distress.  BP 138/84 mmHg  Pulse 60  Temp(Src) 98.3 F (36.8 C) (Oral)  Resp 18  SpO2 100%   Neck: Muscular tenderness present. No spinous process tenderness present. Decreased range of motion present. No Brudzinski's sign noted.  Cardiovascular: Normal rate, regular rhythm, S1 normal, S2 normal and normal heart sounds.  Exam reveals no gallop.   No murmur heard. Pulmonary/Chest: Effort normal and breath sounds normal. No tachypnea. She has no decreased breath sounds. She has no wheezes. She has no rhonchi. She  has no rales. She exhibits tenderness.    TTP over left sternal chest wall. Pt describes pain as similar to the discomfort she's been feeling.   Musculoskeletal:       Arms: Muscle spasm located left cervical paraspinal muscles. Very TTP. No cervical spinous TTP. Tenderness with neck flexion and extension.   Negative homan's sign bilaterally.   Neurological: She is alert and oriented to person, place, and time.  Psychiatric: She has a normal mood and affect. Her speech is normal.   EKG read by Dr Ouida Sills. Findings: Normal sinus rhythm with mild ST elevations that are unchanged from ekg on 1/15.     Assessment & Plan:   13 yof with PMH gerd and allergies presents with 5 day h/o chest pain, shoulder pain, upper back pain, and occasional lightheadedness.   Chest pain, unspecified chest pain type - Plan: EKG 12-Lead --unlikely to be cardiac in nature with unchanged ekg after 5 days of intermittent cp, chest pain occurs at rest, also occurs 1st thing in morning and eases off with movement, stretching makes cp worse, and chest wall tenderness on exam --unlikely to be pulm embolism as vitals are normal, no hemoptysis, and no pleuritic component to cp, no recent immobilization, no recent leg pain or swelling, and negative homan's on exam --pt declines ER this evening, stating she does not want to go there --referral to cardiology to be seen asap as this is the 3rd time we've seen pt for cp in the past 1.5 yrs.   Muscle spasm of back - Plan: cyclobenzaprine (FLEXERIL) 10 MG tablet --could be contributing to chest wall tenderness as she is sore and tense across neck and upper back --flexeril tid prn  Julieta Gutting, PA-C Physician Assistant-Certified [facility]  10/24/2014 8:46 PM

## 2014-10-24 NOTE — Patient Instructions (Addendum)
Your symptoms are most likely not due to a heart issue. The fact that the pain is present when you wake and improves later, also the fact that it is intermittent throughout the day even while you're seated are all reassuring. Your EKG also looks ok today.  Since this is your 3rd chest pain episode in the past 1.5 years, we've referred you to see a cardiologist. They will be in contact with you to get that scheduled. We would like for you to be seen tomorrow if that's possible.  In the meantime, if your chest pain worsens, if you feel like your heart is racing, if you get short of breath or otherwise just don't feel right, please go to the ED asap to be evaluated.  You do have a muscle spasm along your upper back which could be contributing to these symptoms. Please take the flexeril 5mg  up to three times per day as needed for the spasm.

## 2015-01-19 ENCOUNTER — Ambulatory Visit
Admission: RE | Admit: 2015-01-19 | Discharge: 2015-01-19 | Disposition: A | Discharge status: Home / Self Care | Payer: Federal, State, Local not specified - PPO | Source: Ambulatory Visit | Attending: Cardiology | Admitting: Cardiology

## 2015-01-19 ENCOUNTER — Other Ambulatory Visit: Payer: Self-pay | Admitting: Cardiology

## 2015-01-19 DIAGNOSIS — R0602 Shortness of breath: Secondary | ICD-10-CM

## 2015-03-31 ENCOUNTER — Other Ambulatory Visit: Payer: Self-pay

## 2015-03-31 DIAGNOSIS — Z1231 Encounter for screening mammogram for malignant neoplasm of breast: Secondary | ICD-10-CM

## 2015-05-01 ENCOUNTER — Ambulatory Visit
Admission: RE | Admit: 2015-05-01 | Discharge: 2015-05-01 | Disposition: A | Discharge status: Home / Self Care | Payer: Federal, State, Local not specified - PPO | Source: Ambulatory Visit

## 2015-05-01 DIAGNOSIS — Z1231 Encounter for screening mammogram for malignant neoplasm of breast: Secondary | ICD-10-CM

## 2015-05-03 ENCOUNTER — Other Ambulatory Visit: Payer: Self-pay | Admitting: Family Medicine

## 2015-05-03 DIAGNOSIS — R928 Other abnormal and inconclusive findings on diagnostic imaging of breast: Secondary | ICD-10-CM

## 2015-05-05 ENCOUNTER — Other Ambulatory Visit: Payer: Federal, State, Local not specified - PPO

## 2015-05-05 ENCOUNTER — Other Ambulatory Visit: Payer: Self-pay | Admitting: Family Medicine

## 2015-05-05 DIAGNOSIS — R928 Other abnormal and inconclusive findings on diagnostic imaging of breast: Secondary | ICD-10-CM

## 2015-05-08 ENCOUNTER — Ambulatory Visit
Admission: RE | Admit: 2015-05-08 | Discharge: 2015-05-08 | Disposition: A | Discharge status: Home / Self Care | Payer: Federal, State, Local not specified - PPO | Source: Ambulatory Visit | Attending: Family Medicine | Admitting: Family Medicine

## 2015-05-08 DIAGNOSIS — R928 Other abnormal and inconclusive findings on diagnostic imaging of breast: Secondary | ICD-10-CM

## 2015-05-29 ENCOUNTER — Emergency Department (HOSPITAL_COMMUNITY)
Admission: EM | Admit: 2015-05-29 | Discharge: 2015-05-29 | Disposition: A | Discharge status: Home / Self Care | Payer: Federal, State, Local not specified - PPO | Attending: Emergency Medicine | Admitting: Emergency Medicine

## 2015-05-29 ENCOUNTER — Encounter (HOSPITAL_COMMUNITY): Payer: Self-pay | Admitting: Emergency Medicine

## 2015-05-29 DIAGNOSIS — S80861A Insect bite (nonvenomous), right lower leg, initial encounter: Secondary | ICD-10-CM | POA: Diagnosis not present

## 2015-05-29 DIAGNOSIS — I10 Essential (primary) hypertension: Secondary | ICD-10-CM | POA: Insufficient documentation

## 2015-05-29 DIAGNOSIS — S80862A Insect bite (nonvenomous), left lower leg, initial encounter: Secondary | ICD-10-CM | POA: Diagnosis not present

## 2015-05-29 DIAGNOSIS — Z8639 Personal history of other endocrine, nutritional and metabolic disease: Secondary | ICD-10-CM | POA: Insufficient documentation

## 2015-05-29 DIAGNOSIS — Y9389 Activity, other specified: Secondary | ICD-10-CM | POA: Insufficient documentation

## 2015-05-29 DIAGNOSIS — Z8742 Personal history of other diseases of the female genital tract: Secondary | ICD-10-CM | POA: Diagnosis not present

## 2015-05-29 DIAGNOSIS — Y92007 Garden or yard of unspecified non-institutional (private) residence as the place of occurrence of the external cause: Secondary | ICD-10-CM | POA: Insufficient documentation

## 2015-05-29 DIAGNOSIS — Z8739 Personal history of other diseases of the musculoskeletal system and connective tissue: Secondary | ICD-10-CM | POA: Diagnosis not present

## 2015-05-29 DIAGNOSIS — Z862 Personal history of diseases of the blood and blood-forming organs and certain disorders involving the immune mechanism: Secondary | ICD-10-CM | POA: Diagnosis not present

## 2015-05-29 DIAGNOSIS — W57XXXA Bitten or stung by nonvenomous insect and other nonvenomous arthropods, initial encounter: Secondary | ICD-10-CM | POA: Insufficient documentation

## 2015-05-29 DIAGNOSIS — Z79899 Other long term (current) drug therapy: Secondary | ICD-10-CM | POA: Diagnosis not present

## 2015-05-29 DIAGNOSIS — Y999 Unspecified external cause status: Secondary | ICD-10-CM | POA: Diagnosis not present

## 2015-05-29 MED ORDER — HYDROCORTISONE 2.5 % EX LOTN: TOPICAL_LOTION | Freq: Two times a day (BID) | CUTANEOUS | Status: DC

## 2015-05-29 MED ORDER — DIPHENHYDRAMINE HCL 25 MG PO TABS: 25.0000 mg | ORAL_TABLET | Freq: Four times a day (QID) | ORAL | Status: DC

## 2015-05-29 MED ORDER — CEPHALEXIN 250 MG PO CAPS
500.0000 mg | ORAL_CAPSULE | Freq: Once | ORAL | Status: AC
  Administered 2015-05-29: 500 mg via ORAL
  Filled 2015-05-29: qty 2

## 2015-05-29 MED ORDER — CEPHALEXIN 500 MG PO CAPS: 500.0000 mg | ORAL_CAPSULE | Freq: Four times a day (QID) | ORAL | Status: DC

## 2015-05-29 NOTE — Discharge Instructions (Signed)

## 2015-05-29 NOTE — ED Provider Notes (Signed)
CSN: [id number]     Arrival date & time 05/29/15  0003 History   First MD Initiated Contact with Patient 05/29/15 0022     Chief Complaint  Patient presents with  . Insect Bite     (Consider location/radiation/quality/duration/timing/severity/associated sxs/prior Treatment) The history is provided by the patient. No language interpreter was used.  [name] is a 64 year old year-old female with a history of hypertension and hyperlipidemia who states she was cutting the grass yesterday and was bitten by some insects on both of her legs. She states she put alcohol lidocaine on the red areas but it did not help and is continuing to itch. She states the pain is worse and now her legs are red and swollen. She denies any fever, chills.  Past Medical History  Diagnosis Date  . Anemia   . Dyspareunia   . Endometriosis   . Osteopenia   . Urinary incontinence   . Hx of abnormal Pap smear 1980's  . Allergy   . Ulcer    Past Surgical History  Procedure Laterality Date  . Abdominal hysterectomy  09/2008    TVH/BSO--Dr. Quincy Simmonds with TVT  . Bladder suspension  09/2008    Dr. Quincy Simmonds  . Cervix lesion destruction  1980    hx abnormal pap--dysplasia   Family History  Problem Relation Age of Onset  . Arthritis    . Diabetes    . Diabetes Mother   . Hypertension Mother   . Thyroid disease Sister   . Seizures Brother   . Diabetes Brother   . Hypertension Brother   . Diabetes Brother    History  Substance Use Topics  . Smoking status: Never Smoker   . Smokeless tobacco: Not on file  . Alcohol Use: Yes     Comment: occasional   OB History    Gravida Para Term Preterm AB TAB SAB Ectopic Multiple Living   3 3 3       3      Review of Systems  Constitutional: Negative for fever.  Skin: Positive for rash.      Allergies  Ciprofloxacin and Sulfa antibiotics  Home Medications   Prior to Admission medications   Medication Sig Start Date End Date Taking? Authorizing  Provider  cephALEXin (KEFLEX) 500 MG capsule Take 1 capsule (500 mg total) by mouth 4 (four) times daily. 05/29/15   Fitzgerald Dunne Patel-Mills, PA-C  cetirizine (ZYRTEC) 10 MG tablet Take 10 mg by mouth daily.    Historical Provider, MD  Cholecalciferol (VITAMIN D) 2000 UNITS CAPS Take 4,000 Units by mouth daily.    Historical Provider, MD  cyclobenzaprine (FLEXERIL) 10 MG tablet Take 0.5 tablets (5 mg total) by mouth 3 (three) times daily as needed for muscle spasms. 10/24/14   Araceli Bouche, PA  cyproheptadine (PERIACTIN) 4 MG tablet Take one half to one 3 times daily as needed for itching and burning sensation 05/13/13   Posey Boyer, MD  diphenhydrAMINE (BENADRYL) 25 MG tablet Take 1 tablet (25 mg total) by mouth every 6 (six) hours. 05/29/15   Harpreet Pompey Patel-Mills, PA-C  hydrochlorothiazide (MICROZIDE) 12.5 MG capsule  09/19/14   Historical Provider, MD  hydrocortisone 2.5 % lotion Apply topically 2 (two) times daily. 05/29/15   Chaley Castellanos Patel-Mills, PA-C  ipratropium (ATROVENT) 0.03 % nasal spray Place 2 sprays into the nose every 12 (twelve) hours. 06/21/13   Thao P Le, DO   BP 104/68 mmHg  Pulse 75  Temp(Src) 98.2 F (36.8 C)  Resp 16  Ht 5\' 6"  (1.676 m)  Wt 169 lb (76.658 kg)  BMI 27.29 kg/m2  SpO2 98% Physical Exam  Constitutional: She is oriented to person, place, and time. She appears well-developed and well-nourished.  HENT:  Head: Normocephalic and atraumatic.  Eyes: Conjunctivae are normal.  Neck: Normal range of motion. Neck supple.  Cardiovascular: Normal rate.   Pulmonary/Chest: Effort normal. No respiratory distress.  Musculoskeletal: Normal range of motion.  Neurological: She is alert and oriented to person, place, and time.  Skin: Skin is warm and dry. Rash noted.  2 large areas of erythema, edema, warmth to the right lower extremity without fluctuance or drainage. She also has one small 2 cm erythematous area on the left lower leg without signs of infection.  Psychiatric: She has a  normal mood and affect. Her behavior is normal.    ED Course  Procedures (including critical care time) Labs Review Labs Reviewed - No data to display  Imaging Review No results found.   EKG Interpretation None      MDM   Final diagnoses:  Insect bites   Rash is consistent with insect bites. Patient states she has been scratching them which may be the cause of early right leg infection. This does not appear to be cellulitis. I placed the patient on Keflex, Benadryl, and hydrocortisone cream. I discussed return precautions with her. She can follow-up with her primary care physician and verbally agrees with the plan.     Ottie Glazier, PA-C 05/29/15 [place], MD 05/29/15 [phone]

## 2015-05-29 NOTE — ED Notes (Signed)
Patient here with complaint of bilateral lower leg insect bites. States she was mowing the yard last night and felt some bites, but didn't see what caused them. Currently has several red swollen areas on right leg. Areas are itchy and painful. Left leg has some red areas, but they're not bothering her as much.

## 2015-08-03 ENCOUNTER — Encounter (HOSPITAL_COMMUNITY): Payer: Self-pay | Admitting: Emergency Medicine

## 2015-08-03 ENCOUNTER — Emergency Department (HOSPITAL_COMMUNITY)
Admission: EM | Admit: 2015-08-03 | Discharge: 2015-08-04 | Disposition: A | Discharge status: Home / Self Care | Payer: Federal, State, Local not specified - PPO | Attending: Emergency Medicine | Admitting: Emergency Medicine

## 2015-08-03 ENCOUNTER — Emergency Department (HOSPITAL_COMMUNITY): Payer: Federal, State, Local not specified - PPO

## 2015-08-03 DIAGNOSIS — Y9389 Activity, other specified: Secondary | ICD-10-CM | POA: Insufficient documentation

## 2015-08-03 DIAGNOSIS — Y998 Other external cause status: Secondary | ICD-10-CM | POA: Diagnosis not present

## 2015-08-03 DIAGNOSIS — Z79899 Other long term (current) drug therapy: Secondary | ICD-10-CM | POA: Diagnosis not present

## 2015-08-03 DIAGNOSIS — M25562 Pain in left knee: Secondary | ICD-10-CM

## 2015-08-03 DIAGNOSIS — Y9289 Other specified places as the place of occurrence of the external cause: Secondary | ICD-10-CM | POA: Insufficient documentation

## 2015-08-03 DIAGNOSIS — S299XXA Unspecified injury of thorax, initial encounter: Secondary | ICD-10-CM | POA: Insufficient documentation

## 2015-08-03 DIAGNOSIS — Z7951 Long term (current) use of inhaled steroids: Secondary | ICD-10-CM | POA: Diagnosis not present

## 2015-08-03 DIAGNOSIS — M25532 Pain in left wrist: Secondary | ICD-10-CM

## 2015-08-03 DIAGNOSIS — S6992XA Unspecified injury of left wrist, hand and finger(s), initial encounter: Secondary | ICD-10-CM | POA: Insufficient documentation

## 2015-08-03 DIAGNOSIS — Z8742 Personal history of other diseases of the female genital tract: Secondary | ICD-10-CM | POA: Diagnosis not present

## 2015-08-03 DIAGNOSIS — S8992XA Unspecified injury of left lower leg, initial encounter: Secondary | ICD-10-CM | POA: Insufficient documentation

## 2015-08-03 DIAGNOSIS — R0789 Other chest pain: Secondary | ICD-10-CM

## 2015-08-03 DIAGNOSIS — Z862 Personal history of diseases of the blood and blood-forming organs and certain disorders involving the immune mechanism: Secondary | ICD-10-CM | POA: Diagnosis not present

## 2015-08-03 DIAGNOSIS — Z8739 Personal history of other diseases of the musculoskeletal system and connective tissue: Secondary | ICD-10-CM | POA: Diagnosis not present

## 2015-08-03 MED ORDER — HYDROCODONE-ACETAMINOPHEN 5-325 MG PO TABS: 1.0000 | ORAL_TABLET | Freq: Four times a day (QID) | ORAL | Status: DC | PRN

## 2015-08-03 MED ORDER — HYDROCODONE-ACETAMINOPHEN 5-325 MG PO TABS
2.0000 | ORAL_TABLET | Freq: Once | ORAL | Status: AC
  Administered 2015-08-03: 2 via ORAL
  Filled 2015-08-03: qty 2

## 2015-08-03 MED ORDER — METHOCARBAMOL 500 MG PO TABS: 500.0000 mg | ORAL_TABLET | Freq: Two times a day (BID) | ORAL | Status: DC
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
## 2015-08-03 NOTE — ED Notes (Signed)
EKG given to EDP,Mesner,MD., for review. 

## 2015-08-04 DIAGNOSIS — S6992XA Unspecified injury of left wrist, hand and finger(s), initial encounter: Secondary | ICD-10-CM | POA: Diagnosis not present

## 2015-08-22 ENCOUNTER — Ambulatory Visit: Payer: Federal, State, Local not specified - PPO | Admitting: Physical Therapy

## 2015-08-22 ENCOUNTER — Encounter (HOSPITAL_COMMUNITY): Payer: Self-pay | Admitting: Emergency Medicine

## 2015-08-22 ENCOUNTER — Emergency Department (HOSPITAL_COMMUNITY)
Admission: EM | Admit: 2015-08-22 | Discharge: 2015-08-22 | Disposition: A | Discharge status: Home / Self Care | Payer: Federal, State, Local not specified - PPO | Attending: Emergency Medicine | Admitting: Emergency Medicine

## 2015-08-22 ENCOUNTER — Emergency Department (HOSPITAL_COMMUNITY): Payer: Federal, State, Local not specified - PPO

## 2015-08-22 DIAGNOSIS — Z7951 Long term (current) use of inhaled steroids: Secondary | ICD-10-CM | POA: Insufficient documentation

## 2015-08-22 DIAGNOSIS — Z87448 Personal history of other diseases of urinary system: Secondary | ICD-10-CM | POA: Insufficient documentation

## 2015-08-22 DIAGNOSIS — R197 Diarrhea, unspecified: Secondary | ICD-10-CM

## 2015-08-22 DIAGNOSIS — Z8742 Personal history of other diseases of the female genital tract: Secondary | ICD-10-CM | POA: Insufficient documentation

## 2015-08-22 DIAGNOSIS — Z862 Personal history of diseases of the blood and blood-forming organs and certain disorders involving the immune mechanism: Secondary | ICD-10-CM | POA: Insufficient documentation

## 2015-08-22 DIAGNOSIS — K529 Noninfective gastroenteritis and colitis, unspecified: Secondary | ICD-10-CM

## 2015-08-22 DIAGNOSIS — Z8739 Personal history of other diseases of the musculoskeletal system and connective tissue: Secondary | ICD-10-CM | POA: Insufficient documentation

## 2015-08-22 DIAGNOSIS — Z79899 Other long term (current) drug therapy: Secondary | ICD-10-CM | POA: Insufficient documentation

## 2015-08-22 DIAGNOSIS — R42 Dizziness and giddiness: Secondary | ICD-10-CM | POA: Diagnosis not present

## 2015-08-22 LAB — CBG MONITORING, ED: Glucose-Capillary: 91 mg/dL (ref 65–99)

## 2015-08-22 LAB — URINALYSIS, ROUTINE W REFLEX MICROSCOPIC
Bilirubin Urine: NEGATIVE
Glucose, UA: NEGATIVE mg/dL
Hgb urine dipstick: NEGATIVE
Ketones, ur: NEGATIVE mg/dL
Leukocytes, UA: NEGATIVE
Nitrite: NEGATIVE
Protein, ur: NEGATIVE mg/dL
Specific Gravity, Urine: 1.015 (ref 1.005–1.030)
Urobilinogen, UA: 1 mg/dL (ref 0.0–1.0)
pH: 5.5 (ref 5.0–8.0)

## 2015-08-22 LAB — I-STAT TROPONIN, ED: Troponin i, poc: 0 ng/mL (ref 0.00–0.08)

## 2015-08-22 LAB — COMPREHENSIVE METABOLIC PANEL
ALT: 17 U/L (ref 14–54)
AST: 21 U/L (ref 15–41)
Albumin: 4 g/dL (ref 3.5–5.0)
Alkaline Phosphatase: 101 U/L (ref 38–126)
Anion gap: 9 (ref 5–15)
BUN: 14 mg/dL (ref 6–20)
CO2: 26 mmol/L (ref 22–32)
Calcium: 9 mg/dL (ref 8.9–10.3)
Chloride: 104 mmol/L (ref 101–111)
Creatinine, Ser: 0.72 mg/dL (ref 0.44–1.00)
GFR calc Af Amer: >60 mL/min (ref >60)
GFR calc non Af Amer: >60 mL/min (ref >60)
Glucose, Bld: 104 mg/dL — ABNORMAL HIGH (ref 65–99)
Potassium: 3.3 mmol/L — ABNORMAL LOW (ref 3.5–5.1)
Sodium: 139 mmol/L (ref 135–145)
Total Bilirubin: 0.6 mg/dL (ref 0.3–1.2)
Total Protein: 7.3 g/dL (ref 6.5–8.1)

## 2015-08-22 LAB — CBC
HCT: 36.3 % (ref 36.0–46.0)
Hemoglobin: 11.2 g/dL — ABNORMAL LOW (ref 12.0–15.0)
MCH: 26.2 pg (ref 26.0–34.0)
MCHC: 30.9 g/dL (ref 30.0–36.0)
MCV: 85 fL (ref 78.0–100.0)
Platelets: 235 10*3/uL (ref 150–400)
RBC: 4.27 MIL/uL (ref 3.87–5.11)
RDW: 15.1 % (ref 11.5–15.5)
WBC: 5 10*3/uL (ref 4.0–10.5)

## 2015-08-22 LAB — LIPASE, BLOOD: Lipase: 26 U/L (ref 22–51)

## 2015-08-22 MED ORDER — ONDANSETRON HCL 4 MG PO TABS: 4.0000 mg | ORAL_TABLET | Freq: Three times a day (TID) | ORAL | Status: DC | PRN

## 2015-08-22 MED ORDER — SODIUM CHLORIDE 0.9 % IV BOLUS (SEPSIS)
1000.0000 mL | Freq: Once | INTRAVENOUS | Status: AC
  Administered 2015-08-22: 1000 mL via INTRAVENOUS

## 2015-08-22 MED ORDER — IOHEXOL 300 MG/ML  SOLN
100.0000 mL | Freq: Once | INTRAMUSCULAR | Status: AC | PRN
  Administered 2015-08-22: 100 mL via INTRAVENOUS

## 2015-08-22 MED ORDER — ONDANSETRON HCL 4 MG/2ML IJ SOLN
4.0000 mg | Freq: Once | INTRAMUSCULAR | Status: AC
  Administered 2015-08-22: 4 mg via INTRAVENOUS
  Filled 2015-08-22: qty 2

## 2015-08-22 MED ORDER — IOHEXOL 300 MG/ML  SOLN
25.0000 mL | Freq: Once | INTRAMUSCULAR | Status: AC | PRN
  Administered 2015-08-22: 25 mL via ORAL

## 2015-08-22 MED ORDER — ACETAMINOPHEN 325 MG PO TABS
650.0000 mg | ORAL_TABLET | Freq: Once | ORAL | Status: AC
  Administered 2015-08-22: 650 mg via ORAL
  Filled 2015-08-22: qty 2
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
## 2015-08-22 NOTE — ED Notes (Signed)
Patient was alert, oriented and stable upon discharge. RN went over AVS and patient had no further questions.  

## 2015-08-22 NOTE — ED Notes (Signed)
Pt able to keep down crackers and peanut butter with no complaint at this time.

## 2015-08-22 NOTE — ED Notes (Signed)
UNABLE TO COLLECT LABS AT THIS TIME PATIENT IS IN THE BATHROOM.

## 2015-08-22 NOTE — Discharge Instructions (Signed)
Return without fail for worsening symptoms, including worsening pain, fever, vomiting unable to keep down food or fluids, bloody stools, or any other symptoms concerning to you.  Diarrhea Diarrhea is frequent loose and watery bowel movements. It can cause you to feel weak and dehydrated. Dehydration can cause you to become tired and thirsty, have a dry mouth, and have decreased urination that often is dark yellow. Diarrhea is a sign of another problem, most often an infection that will not last long. In most cases, diarrhea typically lasts 2-3 days. However, it can last longer if it is a sign of something more serious. It is important to treat your diarrhea as directed by your caregiver to lessen or prevent future episodes of diarrhea. CAUSES  Some common causes include:  Gastrointestinal infections caused by viruses, bacteria, or parasites.  Food poisoning or food allergies.  Certain medicines, such as antibiotics, chemotherapy, and laxatives.  Artificial sweeteners and fructose.  Digestive disorders. HOME CARE INSTRUCTIONS  Ensure adequate fluid intake (hydration): Have 1 cup (8 oz) of fluid for each diarrhea episode. Avoid fluids that contain simple sugars or sports drinks, fruit juices, whole milk products, and sodas. Your urine should be clear or pale yellow if you are drinking enough fluids. Hydrate with an oral rehydration solution that you can purchase at pharmacies, retail stores, and online. You can prepare an oral rehydration solution at home by mixing the following ingredients together:   - tsp table salt.   tsp baking soda.   tsp salt substitute containing potassium chloride.  1  tablespoons sugar.  1 L (34 oz) of water.  Certain foods and beverages may increase the speed at which food moves through the gastrointestinal (GI) tract. These foods and beverages should be avoided and include:  Caffeinated and alcoholic beverages.  High-fiber foods, such as raw fruits and  vegetables, nuts, seeds, and whole grain breads and cereals.  Foods and beverages sweetened with sugar alcohols, such as xylitol, sorbitol, and mannitol.  Some foods may be well tolerated and may help thicken stool including:  Starchy foods, such as rice, toast, pasta, low-sugar cereal, oatmeal, grits, baked potatoes, crackers, and bagels.  Bananas.  Applesauce.  Add probiotic-rich foods to help increase healthy bacteria in the GI tract, such as yogurt and fermented milk products.  Wash your hands well after each diarrhea episode.  Only take over-the-counter or prescription medicines as directed by your caregiver.  Take a warm bath to relieve any burning or pain from frequent diarrhea episodes. SEEK IMMEDIATE MEDICAL CARE IF:   You are unable to keep fluids down.  You have persistent vomiting.  You have blood in your stool, or your stools are black and tarry.  You do not urinate in 6-8 hours, or there is only a small amount of very dark urine.  You have abdominal pain that increases or localizes.  You have weakness, dizziness, confusion, or light-headedness.  You have a severe headache.  Your diarrhea gets worse or does not get better.  You have a fever or persistent symptoms for more than 2-3 days.  You have a fever and your symptoms suddenly get worse. MAKE SURE YOU:   Understand these instructions.  Will watch your condition.  Will get help right away if you are not doing well or get worse.   This information is not intended to replace advice given to you by your health care provider. Make sure you discuss any questions you have with your health care provider.  Document Released: 10/18/2002 Document Revised: 11/18/2014 Document Reviewed: 07/05/2012 Elsevier Interactive Patient Education 2016 Else[employer]  Colitis Colitis is inflammation of the colon. Colitis may last a short time (acute) or it may last a long time (chronic). CAUSES This condition may be  caused by:  Viruses.  Bacteria.  Reactions to medicine.  Certain autoimmune diseases, such as Crohn disease or ulcerative colitis. SYMPTOMS Symptoms of this condition include:  Diarrhea.  Passing bloody or tarry stool.  Pain.  Fever.  Vomiting.  Tiredness (fatigue).  Weight loss.  Bloating.  Sudden increase in abdominal pain.  Having fewer bowel movements than usual. DIAGNOSIS This condition is diagnosed with a stool test or a blood test. You may also have other tests, including X-rays, a CT scan, or a colonoscopy. TREATMENT Treatment may include:  Resting the bowel. This involves not eating or drinking for a period of time.  Fluids that are given through an IV tube.  Medicine for pain and diarrhea.  Antibiotic medicines.  Cortisone medicines.  Surgery. HOME CARE INSTRUCTIONS Eating and Drinking  Follow instructions from your health care provider about eating or drinking restrictions.  Drink enough fluid to keep your urine clear or pale yellow.  Work with a dietitian to determine which foods cause your condition to flare up.  Avoid foods that cause flare-ups.  Eat a well-balanced diet. Medicines  Take over-the-counter and prescription medicines only as told by your health care provider.  If you were prescribed an antibiotic medicine, take it as told by your health care provider. Do not stop taking the antibiotic even if you start to feel better. General Instructions  Keep all follow-up visits as told by your health care provider. This is important. SEEK MEDICAL CARE IF:  Your symptoms do not go away.  You develop new symptoms. SEEK IMMEDIATE MEDICAL CARE IF:  You have a fever that does not go away with treatment.  You develop chills.  You have extreme weakness, fainting, or dehydration.  You have repeated vomiting.  You develop severe pain in your abdomen.  You pass bloody or tarry stool.   This information is not intended to  replace advice given to you by your health care provider. Make sure you discuss any questions you have with your health care provider.   Document Released: 12/05/2004 Document Revised: 07/19/2015 Document Reviewed: 02/20/2015 Elsevier Interactive Patient Education [employer].

## 2015-08-22 NOTE — ED Notes (Signed)
Patient transported to CT 

## 2015-08-22 NOTE — ED Notes (Signed)
Bed: IR67 Expected date:  Expected time:  Means of arrival:  Comments: Hold triage

## 2015-08-22 NOTE — ED Notes (Addendum)
Per EMS pt began yesterday with chills and massive amounts of diarrhea. Today had a witnessed syncopal episode where she fell back onto a couch and was out for a couple seconds. EMS states they gave her 500 ml IVF in route. Pt only complaint of lower/upper abdominal pain, weakness, and nausea.

## 2015-09-05 ENCOUNTER — Encounter: Payer: Self-pay | Admitting: Physical Therapy

## 2015-09-05 ENCOUNTER — Ambulatory Visit: Payer: Federal, State, Local not specified - PPO | Attending: Family Medicine | Admitting: Physical Therapy

## 2015-09-05 DIAGNOSIS — M542 Cervicalgia: Secondary | ICD-10-CM | POA: Diagnosis not present

## 2015-09-05 DIAGNOSIS — R0789 Other chest pain: Secondary | ICD-10-CM | POA: Diagnosis present

## 2015-09-05 DIAGNOSIS — M545 Low back pain, unspecified: Secondary | ICD-10-CM
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
## 2015-09-05 NOTE — Patient Instructions (Signed)
AROM: Lateral Neck Flexion   Slowly tilt head toward one shoulder, then the other. Hold each position _3__ seconds. Repeat __10__ times per set. Do _2___ sets per session. Do _2___ sessions per day.  AROM: Neck Flexion   Bend head forward. Hold _3___ seconds. Repeat _10___ times per set. Do _2___ sets per session. Do _2___ sessions per day.  Levator Stretch   Grasp seat or sit on hand on side to be stretched. Turn head toward other side and look down. Use hand on head to gently stretch neck in that position. Hold _20___ seconds. Repeat on other side. Repeat __3__ times. Do __2__ sessions per day.  Elevation / Depression   While slowly inhaling, bring shoulders toward ears. Hold _2__ seconds. Slowly exhale while relaxing shoulders backward and downward. Repeat _10__ times. Do _2__ times per day.SCAPULA: Retraction   Hold cane with both hands. Pinch shoulder blades together. Do not shrug shoulders. Hold ___ seconds. Use___ lb weight on cane. ___ reps per set, ___ sets per day, ___ days per week  NECK TENSION: Assisted Stretch   Reach right arm around head and hold slightly above ear. Gently bring right ear toward right shoulder. Hold position for ___ breaths. Repeat with other arm. Repeat ___ times, alternating arms. Do ___ times per day.  Flexibility: Neck Retraction   Pull head straight back, keeping eyes and jaw level. Repeat ____ times per set. Do ____ sets per session. Do ____ sessions per day.  

## 2015-09-07 ENCOUNTER — Encounter: Payer: Self-pay | Admitting: Physical Therapy

## 2015-09-07 ENCOUNTER — Ambulatory Visit: Payer: Federal, State, Local not specified - PPO | Admitting: Physical Therapy

## 2015-09-07 DIAGNOSIS — M545 Low back pain, unspecified: Secondary | ICD-10-CM

## 2015-09-07 DIAGNOSIS — R0789 Other chest pain: Secondary | ICD-10-CM

## 2015-09-07 DIAGNOSIS — M542 Cervicalgia: Secondary | ICD-10-CM | POA: Diagnosis not present

## 2015-09-07 NOTE — Therapy (Signed)
[address] [place], [place], [address] Phone: [phone]   Fax:  [phone]  Physical Therapy Treatment  Patient Details  Name: Margaret [name] MRN: [id number] Date of Birth: 1950/12/05 Referring Provider: Cari Caraway  Encounter Date: 09/07/2015      PT End of Session - 09/07/15 0842    Visit Number 2   Date for PT Re-Evaluation 11/05/15   PT Start Time 0804   PT Stop Time 0852   PT Time Calculation (min) 48 min      Past Medical History  Diagnosis Date  . Anemia   . Dyspareunia   . Endometriosis   . Osteopenia   . Urinary incontinence   . Hx of abnormal Pap smear 1980's  . Allergy   . Ulcer     Past Surgical History  Procedure Laterality Date  . Abdominal hysterectomy  09/2008    TVH/BSO--Dr. Quincy Simmonds with TVT  . Bladder suspension  09/2008    Dr. Quincy Simmonds  . Cervix lesion destruction  1980    hx abnormal pap--dysplasia    There were no vitals filed for this visit.  Visit Diagnosis:  Neck pain  Chest wall pain  Midline low back pain without sciatica      Subjective Assessment - 09/07/15 0845    Subjective Patient reports that she has felt a little more pain in her chest since the last visit, only because she was guarding herself with her rounded shoulders. Her left side hurts a little worse than the right.  Felt like the E stim did help, but would like to try it on the chest.  Pt reported she returned to work yesterday after being off for a month,  noticed increased stiffness in shoulders and low back.                           [facility] Adult PT Treatment/Exercise - 09/07/15 0001    Exercises   Exercises Shoulder;Neck   Neck Exercises: Standing   Other Standing Exercises Chin tucks 2 sets of 10 with over pressure   Shoulder Exercises: ROM/Strengthening   UBE (Upper Arm Bike) Level 2 4 mins 2 forward/2back   Cybex Row Limitations 15lbs 2 sets 15   "W" Arms  red theraband 2 sets 15   Other ROM/Strengthening Exercises Yellow Theraband 3 ways 2 sets 10   Modalities   Modalities Moist Heat;Electrical Stimulation   Moist Heat Therapy   Number Minutes Moist Heat 15 Minutes   Moist Heat Location Lumbar Spine;Cervical;Other (comment)   Electrical Stimulation   Electrical Stimulation Location chest, lumbar   Electrical Stimulation Action premod    Electrical Stimulation Parameters tolerance    Electrical Stimulation Goals Pain                PT Education - 09/07/15 [phone]    Education provided Yes   Education Details Postural education, Guarding    Person(s) Educated Patient   Methods Explanation;Demonstration;Tactile cues;Verbal cues   Comprehension Verbalized understanding;Returned demonstration          PT Short Term Goals - 09/07/15 0846    PT SHORT TERM GOAL #1   Title independent with initial HEP   Status Achieved           PT Long Term Goals - 09/05/15 1644    PT LONG TERM GOAL #1   Title understand proper posture and body mechanics   Time  8   Period Weeks   Status New   PT LONG TERM GOAL #2   Title decrease pain 50%   Time 8   Period Weeks   Status New   PT LONG TERM GOAL #3   Title increase ROM 50%   Time 8   Period Weeks   Status New   PT LONG TERM GOAL #4   Title tolerate work > 6 hours   Time 8   Period Weeks   Status New               Plan - 09/07/15 0177    Clinical Impression Statement Patient tolerated exercises well. Did complain of increased strain in chest with scapular exercises.  Pt required verbal and tactile cue to maintain scapular retraction with exercises.    PT Next Visit Plan Continue to work on scapular stabilizer exercises, increase neck stretching and strengthening.         Problem List Patient Active Problem List   Diagnosis Date Noted  . Back pain 04/12/2014  . Arthritis 04/12/2014  . Knee pain 04/12/2014  . Unspecified essential hypertension 11/27/2013  .  Osteoarthritis of left knee 07/16/2012    Crist Fat, SPTA 09/07/2015, 8:47 AM  [address] [place], [place], [address] Phone: [phone]   Fax:  [phone]  Name: Admire Bunnell MRN: [id number] Date of Birth: 1951-02-02

## 2015-09-12 ENCOUNTER — Encounter: Payer: Self-pay | Admitting: Physical Therapy

## 2015-09-12 ENCOUNTER — Ambulatory Visit: Payer: Federal, State, Local not specified - PPO | Attending: Family Medicine | Admitting: Physical Therapy

## 2015-09-12 DIAGNOSIS — M542 Cervicalgia: Secondary | ICD-10-CM | POA: Diagnosis not present

## 2015-09-12 DIAGNOSIS — M545 Low back pain: Secondary | ICD-10-CM | POA: Diagnosis present

## 2015-09-12 DIAGNOSIS — R0789 Other chest pain: Secondary | ICD-10-CM | POA: Insufficient documentation

## 2015-09-12 NOTE — Therapy (Signed)
[address] [place], [place], [address] Phone: [phone]   Fax:  [phone]  Physical Therapy Treatment  Patient Details  Name: Margaret [name] MRN: [id number] Date of Birth: [birth year] Referring Provider: Cari Caraway  Encounter Date: 09/12/2015      PT End of Session - 09/12/15 1657    Visit Number 3   Date for PT Re-Evaluation 11/05/15   PT Start Time 1615   PT Stop Time 7628   PT Time Calculation (min) 60 min      Past Medical History  Diagnosis Date  . Anemia   . Dyspareunia   . Endometriosis   . Osteopenia   . Urinary incontinence   . Hx of abnormal Pap smear 1980's  . Allergy   . Ulcer     Past Surgical History  Procedure Laterality Date  . Abdominal hysterectomy  09/2008    TVH/BSO--Dr. Quincy Simmonds with TVT  . Bladder suspension  09/2008    Dr. Quincy Simmonds  . Cervix lesion destruction  1980    hx abnormal pap--dysplasia    There were no vitals filed for this visit.  Visit Diagnosis:  Neck pain  Chest wall pain      Subjective Assessment - 09/12/15 1616    Subjective overall feeling much better esp neck and back, chest still painful esp with certain mvmts   Currently in Pain? Yes   Pain Score 3    Pain Location Neck   Multiple Pain Sites Yes   Pain Score 6   Pain Location Chest                         [facility] Adult PT Treatment/Exercise - 09/12/15 0001    Neck Exercises: Machines for Strengthening   UBE (Upper Arm Bike) 3 fwd/3 back L 2   Cybex Row 15# 2 sets 10   Cybex Chest Press 10# 10 times  5# 10 times   Other Machines for Strengthening lat pull 15# 2 sets 10   Neck Exercises: Standing   Wall Push Ups 15 reps   Other Standing Exercises Chin tucks 2 sets of 10 with over pressure   Shoulder Exercises: Standing   Other Standing Exercises 5# pulley 3 way scap stab 10 reps   Moist Heat Therapy   Number Minutes Moist Heat 15 Minutes   Moist Heat  Location Lumbar Spine;Cervical;Other (comment)   Electrical Stimulation   Electrical Stimulation Location chest wall   Electrical Stimulation Action IFC   Electrical Stimulation Goals Pain                  PT Short Term Goals - 09/07/15 0846    PT SHORT TERM GOAL #1   Title independent with initial HEP   Status Achieved           PT Long Term Goals - 09/05/15 1644    PT LONG TERM GOAL #1   Title understand proper posture and body mechanics   Time 8   Period Weeks   Status New   PT LONG TERM GOAL #2   Title decrease pain 50%   Time 8   Period Weeks   Status New   PT LONG TERM GOAL #3   Title increase ROM 50%   Time 8   Period Weeks   Status New   PT LONG TERM GOAL #4   Title tolerate work > 6 hours   Time  8   Period Weeks   Status New               Plan - 09/12/15 1657    Clinical Impression Statement pt with increased tolerance to ther ex with improved ROM. Improved posture with decreased rounded shld.   PT Next Visit Plan Continue to work on scapular stabilizer exercises, increase neck stretching and strengthening.         Problem List Patient Active Problem List   Diagnosis Date Noted  . Back pain 04/12/2014  . Arthritis 04/12/2014  . Knee pain 04/12/2014  . Unspecified essential hypertension 11/27/2013  . Osteoarthritis of left knee 07/16/2012    Dawnielle Christiana,ANGIE PTA 09/12/2015, 4:58 PM  [address] [place], [place], [address] Phone: [phone]   Fax:  [phone]  Name: Margaret [name] MRN: [id number] Date of Birth: 1951/09/29

## 2015-09-14 ENCOUNTER — Ambulatory Visit: Payer: Federal, State, Local not specified - PPO | Admitting: Physical Therapy

## 2015-09-14 ENCOUNTER — Encounter: Payer: Federal, State, Local not specified - PPO | Admitting: Physical Therapy

## 2015-09-14 ENCOUNTER — Encounter: Payer: Self-pay | Admitting: Physical Therapy

## 2015-09-14 DIAGNOSIS — M542 Cervicalgia: Secondary | ICD-10-CM

## 2015-09-14 DIAGNOSIS — M545 Low back pain, unspecified: Secondary | ICD-10-CM

## 2015-09-14 DIAGNOSIS — R0789 Other chest pain: Secondary | ICD-10-CM
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
## 2015-09-15 ENCOUNTER — Ambulatory Visit: Payer: Federal, State, Local not specified - PPO | Admitting: Physical Therapy

## 2015-09-18 ENCOUNTER — Encounter: Payer: Self-pay | Admitting: Physical Therapy

## 2015-09-18 ENCOUNTER — Ambulatory Visit: Payer: Federal, State, Local not specified - PPO | Admitting: Physical Therapy

## 2015-09-18 DIAGNOSIS — M542 Cervicalgia: Secondary | ICD-10-CM

## 2015-09-18 DIAGNOSIS — R0789 Other chest pain: Secondary | ICD-10-CM

## 2015-09-18 NOTE — Therapy (Signed)
[place], [place], [address] Phone: [phone]   Fax:  [phone]  Physical Therapy Treatment  Patient Details  Name: Margaret [name] MRN: [id number] Date of Birth: February 04, 1951 Referring Provider: Cari Caraway  Encounter Date: 09/18/2015      PT End of Session - 09/18/15 0856    Visit Number 5   Date for PT Re-Evaluation 11/05/15   PT Start Time 0806   PT Stop Time 0904   PT Time Calculation (min) 58 min   Activity Tolerance Patient tolerated treatment well   Behavior During Therapy [facility] for tasks assessed/performed      Past Medical History  Diagnosis Date  . Anemia   . Dyspareunia   . Endometriosis   . Osteopenia   . Urinary incontinence   . Hx of abnormal Pap smear 1980's  . Allergy   . Ulcer     Past Surgical History  Procedure Laterality Date  . Abdominal hysterectomy  09/2008    TVH/BSO--Dr. Quincy Simmonds with TVT  . Bladder suspension  09/2008    Dr. Quincy Simmonds  . Cervix lesion destruction  1980    hx abnormal pap--dysplasia    There were no vitals filed for this visit.  Visit Diagnosis:  Neck pain  Chest wall pain      Subjective Assessment - 09/18/15 0806    Subjective Pt reports being sore after last PT treatment and a few days afterwards. Once soreness went away she felt fine, still has discomfort in chest area.    Patient Stated Goals have no pain   Currently in Pain? No/denies   Pain Score 0-No pain                         [facility] Adult PT Treatment/Exercise - 09/18/15 0001    Neck Exercises: Machines for Strengthening   UBE (Upper Arm Bike) L3 69fd/2rev   [employer] for Upper Extremity Motion NuStep L4 x6    Cybex Row 20# 2 sets 15   Cybex Chest Press 10# 2x10    Other Machines for Strengthening lat pull 20# 2 sets 15   Neck Exercises: Standing   Wall Push Ups 15 reps  2 sets   Other Standing Exercises Chin tucks 2 sets of 10 against ball     Shoulder Exercises: Standing   Horizontal ABduction 12 reps;Both;Theraband  2 sets    Theraband Level (Shoulder Horizontal ABduction) Level 2 (Red)   External Rotation 15 reps;Theraband;Both  2 sets    Theraband Level (Shoulder External Rotation) Level 1 (Yellow)   Extension Both;Theraband;10 reps  3 sets   Theraband Level (Shoulder Extension) Level 3 (Green)   Moist Heat Therapy   Number Minutes Moist Heat 10 Minutes   Moist Heat Location Lumbar Spine;Cervical;Other (comment)   Electrical Stimulation   Electrical Stimulation Location chest wall   Electrical Stimulation Action IFC   Electrical Stimulation Goals Pain                  PT Short Term Goals - 09/07/15 0846    PT SHORT TERM GOAL #1   Title independent with initial HEP   Status Achieved           PT Long Term Goals - 09/18/15 0856    PT LONG TERM GOAL #1   Title understand proper posture and body mechanics   Status Partially Met   PT LONG TERM GOAL #2  Title decrease pain 50%   Status Partially Met               Plan - 09/18/15 0857    Clinical Impression Statement Pt 6 minutes late for Pt session this date, Reports soreness after last treatment do some interventions adjusted in regards to weights and reps. Can be hypo verbal at times and needs cures to redirect. Does not reports chest pain with seated rows this date.   Pt will benefit from skilled therapeutic intervention in order to improve on the following deficits Decreased mobility;Decreased range of motion;Decreased strength;Increased muscle spasms;Pain   Rehab Potential Good   PT Duration 8 weeks   PT Treatment/Interventions Electrical Stimulation;Moist Heat;Therapeutic exercise;Manual techniques;Ultrasound;Patient/family education   PT Next Visit Plan Continue to work on scapular stabilizer exercises, increase neck stretching and strengthening.         Problem List Patient Active Problem List   Diagnosis Date Noted  . Back  pain 04/12/2014  . Arthritis 04/12/2014  . Knee pain 04/12/2014  . Unspecified essential hypertension 11/27/2013  . Osteoarthritis of left knee 07/16/2012    Scot Jun, PTA  09/18/2015, 8:59 AM  [place], [place], [address] Phone: [phone]   Fax:  [phone]  Name: Margaret [name] MRN: [id number] Date of Birth: 1951-03-29

## 2015-09-19 ENCOUNTER — Encounter: Payer: Self-pay | Admitting: Orthopedic Surgery

## 2015-09-19 ENCOUNTER — Ambulatory Visit (INDEPENDENT_AMBULATORY_CARE_PROVIDER_SITE_OTHER): Payer: Federal, State, Local not specified - PPO | Admitting: Orthopedic Surgery

## 2015-09-19 VITALS — BP 109/66 | Ht 66.0 in | Wt 164.0 lb

## 2015-09-19 DIAGNOSIS — M545 Low back pain, unspecified: Secondary | ICD-10-CM

## 2015-09-19 DIAGNOSIS — S8012XA Contusion of left lower leg, initial encounter: Secondary | ICD-10-CM | POA: Diagnosis not present

## 2015-09-19 NOTE — Patient Instructions (Addendum)
Wear knee sleeve when walking, continue therapy
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
## 2015-09-21 ENCOUNTER — Ambulatory Visit: Payer: Federal, State, Local not specified - PPO | Admitting: Physical Therapy

## 2015-09-21 ENCOUNTER — Encounter: Payer: Self-pay | Admitting: Physical Therapy

## 2015-09-21 DIAGNOSIS — R0789 Other chest pain: Secondary | ICD-10-CM

## 2015-09-21 DIAGNOSIS — M545 Low back pain, unspecified: Secondary | ICD-10-CM

## 2015-09-21 DIAGNOSIS — M542 Cervicalgia: Secondary | ICD-10-CM | POA: Diagnosis not present

## 2015-09-21 NOTE — Therapy (Signed)
[place], [place], [address] Phone: [phone]   Fax:  [phone]  Physical Therapy Treatment  Patient Details  Name: Margaret [name] MRN: [id number] Date of Birth: 1951-02-16 Referring Provider: Cari Caraway  Encounter Date: 09/21/2015      PT End of Session - 09/21/15 1716    Visit Number 6   PT Start Time 0426   PT Stop Time 0524   PT Time Calculation (min) 58 min      Past Medical History  Diagnosis Date  . Anemia   . Dyspareunia   . Endometriosis   . Osteopenia   . Urinary incontinence   . Hx of abnormal Pap smear 1980's  . Allergy   . Ulcer     Past Surgical History  Procedure Laterality Date  . Abdominal hysterectomy  09/2008    TVH/BSO--Dr. Quincy Simmonds with TVT  . Bladder suspension  09/2008    Dr. Quincy Simmonds  . Cervix lesion destruction  1980    hx abnormal pap--dysplasia    There were no vitals filed for this visit.  Visit Diagnosis:  Neck pain  Chest wall pain  Midline low back pain without sciatica      Subjective Assessment - 09/21/15 1631    Subjective Pt reports saw the MD, reported increased soreness in low back and L knee. Still c/o of deep pain in the chest.    Currently in Pain? Yes   Pain Score 4    Pain Location Chest   Multiple Pain Sites Yes   Pain Score 2   Pain Location Back   Pain Orientation Lower;Left   Pain Descriptors / Indicators Aching;Dull                         [facility] Adult PT Treatment/Exercise - 09/21/15 0001    Exercises   Exercises Lumbar   Neck Exercises: Machines for Strengthening   UBE (Upper Arm Bike) L3 26fd/2rev   [employer] for Upper Extremity Motion NuStep L4 x5   Cybex Row 20# 2 sets 15   Other Machines for Strengthening lat pull 20# 2 sets 15   Lumbar Exercises: Machines for Strengthening   Cybex Lumbar Extension 10# Extension and abduction    Lumbar Exercises: Seated   Other Seated Lumbar  Exercises dyna disc lumbar stabilization, flex/ext , CW, CCW    Lumbar Exercises: Supine   Bridge 5 reps;5 seconds;Compliant   Moist Heat Therapy   Number Minutes Moist Heat 15 Minutes   Moist Heat Location Lumbar Spine   Electrical Stimulation   Electrical Stimulation Location chest/ low back    Electrical Stimulation Action premod   Electrical Stimulation Goals Pain                PT Education - 09/21/15 1708    Education Details Educated pt on proper body mechanics, posture, and workspace ergonomics to decrease LBP from sitting for long periods of time    Person(s) Educated Patient   Methods Explanation;Demonstration   Comprehension Verbalized understanding;Returned demonstration          PT Short Term Goals - 09/07/15 0846    PT SHORT TERM GOAL #1   Title independent with initial HEP   Status Achieved           PT Long Term Goals - 09/18/15 0856    PT LONG TERM GOAL #1   Title understand proper posture and body mechanics  Status Partially Met   PT LONG TERM GOAL #2   Title decrease pain 50%   Status Partially Met               Plan - 09/21/15 1657    Clinical Impression Statement Pt tolerated addition of LE and lumbar exercises with c/o of slight increase in pain in low back and hip.  Educated patient on posture, body mechanics, and ergonomics of work station to improve LBP that occurs with sitting for long periods of time.    PT Next Visit Plan continue to add LE and lumbar exercises         Problem List Patient Active Problem List   Diagnosis Date Noted  . Back pain 04/12/2014  . Arthritis 04/12/2014  . Knee pain 04/12/2014  . Unspecified essential hypertension 11/27/2013  . Osteoarthritis of left knee 07/16/2012    Crist Fat, SPTA 09/21/2015, 5:23 PM  [place], [place], [address] Phone: [phone]   Fax:  [phone]  Name: Natisha Trzcinski MRN: [id number] Date of Birth: Jun 14, 1951

## 2015-09-25 ENCOUNTER — Ambulatory Visit: Payer: Federal, State, Local not specified - PPO | Admitting: Physical Therapy

## 2015-09-26 ENCOUNTER — Encounter: Payer: Self-pay | Admitting: Physical Therapy

## 2015-09-26 ENCOUNTER — Ambulatory Visit: Payer: Federal, State, Local not specified - PPO | Admitting: Physical Therapy

## 2015-09-26 DIAGNOSIS — M545 Low back pain, unspecified: Secondary | ICD-10-CM

## 2015-09-26 DIAGNOSIS — R0789 Other chest pain: Secondary | ICD-10-CM

## 2015-09-26 DIAGNOSIS — M542 Cervicalgia: Secondary | ICD-10-CM

## 2015-09-26 NOTE — Therapy (Signed)
[place], [place], [address] Phone: [phone]   Fax:  [phone]  Physical Therapy Treatment  Patient Details  Name: Margaret [name] MRN: [id number] Date of Birth: 02-Oct-1951 Referring Provider: Cari Caraway  Encounter Date: 09/26/2015      PT End of Session - 09/26/15 0845    Visit Number 7   Date for PT Re-Evaluation 11/05/15   PT Start Time 0800   PT Stop Time 0855   PT Time Calculation (min) 55 min   Activity Tolerance Patient tolerated treatment well   Behavior During Therapy [facility] for tasks assessed/performed      Past Medical History  Diagnosis Date  . Anemia   . Dyspareunia   . Endometriosis   . Osteopenia   . Urinary incontinence   . Hx of abnormal Pap smear 1980's  . Allergy   . Ulcer     Past Surgical History  Procedure Laterality Date  . Abdominal hysterectomy  09/2008    TVH/BSO--Dr. Quincy Simmonds with TVT  . Bladder suspension  09/2008    Dr. Quincy Simmonds  . Cervix lesion destruction  1980    hx abnormal pap--dysplasia    There were no vitals filed for this visit.  Visit Diagnosis:  Midline low back pain without sciatica  Chest wall pain  Neck pain                       [facility] Adult PT Treatment/Exercise - 09/26/15 0001    Neck Exercises: Machines for Strengthening   UBE (Upper Arm Bike) L3.5 87frd/2rev   [employer] for Upper Extremity Motion NuStep L4 x6 minutes   Cybex Row 20# 2 sets 15   Other Machines for Strengthening lat pull 20# 2 sets 15   Neck Exercises: Standing   Wall Push Ups 15 reps  2 sets   Shoulder Exercises: Standing   Horizontal ABduction 12 reps;Both;Theraband  2 sets    Theraband Level (Shoulder Horizontal ABduction) Level 2 (Red)   Extension Both;Theraband;15 reps  2 sets   Theraband Level (Shoulder Extension) Level 3 (Green)   Moist Heat Therapy   Number Minutes Moist Heat 10 Minutes   Moist Heat Location Lumbar  Spine   Electrical Stimulation   Electrical Stimulation Location chest/ low back    Electrical Stimulation Action premod   Electrical Stimulation Goals Pain                  PT Short Term Goals - 09/07/15 0846    PT SHORT TERM GOAL #1   Title independent with initial HEP   Status Achieved           PT Long Term Goals - 09/18/15 0856    PT LONG TERM GOAL #1   Title understand proper posture and body mechanics   Status Partially Met   PT LONG TERM GOAL #2   Title decrease pain 50%   Status Partially Met               Plan - 09/26/15 0845    Clinical Impression Statement Continues to report pain in chest area. Pt reports difficulty breathing with seated rows but able to complete all sets. Completed all interventions this date occasional pain in upper traps areas. Elected only 10 minutes of E- Stim and heat because she had to get to work.   Pt will benefit from skilled therapeutic intervention in order to improve on the following  deficits Decreased mobility;Decreased range of motion;Decreased strength;Increased muscle spasms;Pain   Rehab Potential Good   PT Frequency 2x / week   PT Duration 8 weeks   PT Treatment/Interventions Electrical Stimulation;Moist Heat;Therapeutic exercise;Manual techniques;Ultrasound;Patient/family education   PT Next Visit Plan continue to add LE and lumbar exercises         Problem List Patient Active Problem List   Diagnosis Date Noted  . Back pain 04/12/2014  . Arthritis 04/12/2014  . Knee pain 04/12/2014  . Unspecified essential hypertension 11/27/2013  . Osteoarthritis of left knee 07/16/2012    Scot Jun, PTA  09/26/2015, 8:48 AM  [place], [place], [address] Phone: [phone]   Fax:  [phone]  Name: Margaret [name] MRN: [id number] Date of Birth: [birth year]

## 2015-09-28 DIAGNOSIS — L669 Cicatricial alopecia, unspecified: Secondary | ICD-10-CM | POA: Insufficient documentation

## 2015-09-29 ENCOUNTER — Ambulatory Visit: Payer: Federal, State, Local not specified - PPO | Admitting: Physical Therapy

## 2015-10-04 ENCOUNTER — Ambulatory Visit: Payer: Federal, State, Local not specified - PPO | Admitting: Physical Therapy

## 2015-10-04 ENCOUNTER — Encounter: Payer: Self-pay | Admitting: Physical Therapy

## 2015-10-04 DIAGNOSIS — M542 Cervicalgia: Secondary | ICD-10-CM | POA: Diagnosis not present

## 2015-10-04 DIAGNOSIS — M545 Low back pain, unspecified: Secondary | ICD-10-CM

## 2015-10-04 DIAGNOSIS — R0789 Other chest pain: Secondary | ICD-10-CM

## 2015-10-04 NOTE — Therapy (Signed)
[place], [place], [address] Phone: [phone]   Fax:  [phone]  Physical Therapy Treatment  Patient Details  Name: Margaret [name] MRN: [id number] Date of Birth: Jan 24, 1951 Referring Provider: Cari Caraway  Encounter Date: 10/04/2015      PT End of Session - 10/04/15 1554    Visit Number 8   Date for PT Re-Evaluation 11/05/15   PT Start Time 6333   PT Stop Time 1556   PT Time Calculation (min) 39 min   Activity Tolerance Patient tolerated treatment well   Behavior During Therapy [facility] for tasks assessed/performed      Past Medical History  Diagnosis Date  . Anemia   . Dyspareunia   . Endometriosis   . Osteopenia   . Urinary incontinence   . Hx of abnormal Pap smear 1980's  . Allergy   . Ulcer     Past Surgical History  Procedure Laterality Date  . Abdominal hysterectomy  09/2008    TVH/BSO--Dr. Quincy Simmonds with TVT  . Bladder suspension  09/2008    Dr. Quincy Simmonds  . Cervix lesion destruction  1980    hx abnormal pap--dysplasia    There were no vitals filed for this visit.  Visit Diagnosis:  Neck pain  Chest wall pain  Midline low back pain without sciatica      Subjective Assessment - 10/04/15 1517    Subjective Pt reports that her ergonomic has been adjusted at work. Overall improvement still has chest and back pain occasionally.    Currently in Pain? Yes   Pain Score 4    Pain Location Back                         [facility] Adult PT Treatment/Exercise - 10/04/15 0001    Neck Exercises: Machines for Strengthening   UBE (Upper Arm Bike) L3.5 49fd/3rev   Cybex Row 25# 2 sets 15   Other Machines for Strengthening lat pull 25# 2 sets 15   Neck Exercises: Standing   Wall Push Ups 15 reps  2 sets    Shoulder Exercises: Standing   Horizontal ABduction Both;Theraband;15 reps  2 sets    Theraband Level (Shoulder Horizontal ABduction) Level 2 (Red)    External Rotation 15 reps;Theraband;Both  2 sets    Theraband Level (Shoulder External Rotation) Level 2 (Red)   Internal Rotation 15 reps;Theraband   Theraband Level (Shoulder Internal Rotation) Level 2 (Red)   Extension Both;Theraband;15 reps  2 sets    Theraband Level (Shoulder Extension) Level 3 (Nyoka Cowden                  PT Short Term Goals - 09/07/15 0846    PT SHORT TERM GOAL #1   Title independent with initial HEP   Status Achieved           PT Long Term Goals - 09/18/15 0856    PT LONG TERM GOAL #1   Title understand proper posture and body mechanics   Status Partially Met   PT LONG TERM GOAL #2   Title decrease pain 50%   Status Partially Met               Plan - 10/04/15 1555    Clinical Impression Statement Pt reports improvement overall. Able to complete all exercises this date. Does report chest discomfort with seated ROWS. Pt also reports posterior discomfort with horizontal abduction.  Pt will benefit from skilled therapeutic intervention in order to improve on the following deficits Decreased mobility;Decreased range of motion;Decreased strength;Increased muscle spasms;Pain   Rehab Potential Good   PT Frequency 2x / week   PT Duration 8 weeks   PT Treatment/Interventions Electrical Stimulation;Moist Heat;Therapeutic exercise;Manual techniques;Ultrasound;Patient/family education   PT Next Visit Plan continue to add LE and lumbar exercises         Problem List Patient Active Problem List   Diagnosis Date Noted  . Back pain 04/12/2014  . Arthritis 04/12/2014  . Knee pain 04/12/2014  . Unspecified essential hypertension 11/27/2013  . Osteoarthritis of left knee 07/16/2012    Scot Jun, PTA  10/04/2015, 3:57 PM  [place], [place], [address] Phone: [phone]   Fax:  [phone]  Name: Margaret [name] MRN: [id number] Date of  Birth: 11-Feb-1951

## 2015-10-09 ENCOUNTER — Encounter: Payer: Self-pay | Admitting: Physical Therapy

## 2015-10-09 ENCOUNTER — Ambulatory Visit: Payer: Federal, State, Local not specified - PPO | Admitting: Physical Therapy

## 2015-10-09 DIAGNOSIS — R0789 Other chest pain: Secondary | ICD-10-CM

## 2015-10-09 DIAGNOSIS — M542 Cervicalgia: Secondary | ICD-10-CM

## 2015-10-09 DIAGNOSIS — M545 Low back pain, unspecified: Secondary | ICD-10-CM

## 2015-10-09 NOTE — Therapy (Signed)
[place], [place], [address] Phone: [phone]   Fax:  [phone]  Physical Therapy Treatment  Patient Details  Name: Margaret [name] MRN: [id number] Date of Birth: January 15, 1951 Referring Provider: Cari Caraway  Encounter Date: 10/09/2015      PT End of Session - 10/09/15 0843    Visit Number 9   PT Start Time 0806   PT Stop Time 0859   PT Time Calculation (min) 53 min      Past Medical History  Diagnosis Date  . Anemia   . Dyspareunia   . Endometriosis   . Osteopenia   . Urinary incontinence   . Hx of abnormal Pap smear 1980's  . Allergy   . Ulcer     Past Surgical History  Procedure Laterality Date  . Abdominal hysterectomy  09/2008    TVH/BSO--Dr. Quincy Simmonds with TVT  . Bladder suspension  09/2008    Dr. Quincy Simmonds  . Cervix lesion destruction  1980    hx abnormal pap--dysplasia    There were no vitals filed for this visit.  Visit Diagnosis:  Neck pain  Chest wall pain  Midline low back pain without sciatica      Subjective Assessment - 10/09/15 0812    Subjective Pt reports that her chest pain continues to be the most bothersome. Her low back pain comes and goes. Reports she helped her daughter move a tv over the holiday which seemed to irritate her shoulder and back some.     Currently in Pain? Yes   Pain Score 3    Pain Location Chest   Pain Orientation Mid   Pain Descriptors / Indicators Aching   Pain Type Acute pain   Pain Onset 1 to 4 weeks ago                         [facility] Adult PT Treatment/Exercise - 10/09/15 0001    Neck Exercises: Machines for Strengthening   UBE (Upper Arm Bike) L3.5 77frd/3rev   [employer] for Upper Extremity Motion NuStep L6 x4 minutes   Neck Exercises: Standing   Wall Push Ups 15 reps  2 sets    Other Standing Exercises Corner stretch 3 levels 30 sec x 2    Shoulder Exercises: Standing   Horizontal ABduction  Both;Theraband;15 reps  2 sets    Extension Both;Theraband;15 reps  2 sets    Theraband Level (Shoulder Extension) Level 3 (Green)   Other Standing Exercises ball on wall push up CC, CCW, ;ball up wall with lift off    Moist Heat Therapy   Number Minutes Moist Heat 15 Minutes   Moist Heat Location Cervical;Lumbar Spine   Electrical Stimulation   Electrical Stimulation Location chest/ low back    Electrical Stimulation Action premod   Electrical Stimulation Goals Pain                PT Education - 10/09/15 0843    Education Details Educated pt on corner stretch for chest    Person(s) Educated Patient   Methods Explanation;Demonstration;Tactile cues   Comprehension Verbalized understanding;Returned demonstration          PT Short Term Goals - 09/07/15 0846    PT SHORT TERM GOAL #1   Title independent with initial HEP   Status Achieved           PT Long Term Goals - 10/09/15 [id number]    PT  LONG TERM GOAL #1   Title understand proper posture and body mechanics   Status Achieved   PT LONG TERM GOAL #2   Title decrease pain 50%   Status Achieved   PT LONG TERM GOAL #3   Title increase ROM 50%   Status On-going   PT LONG TERM GOAL #4   Title tolerate work > 6 hours   Status On-going               Plan - 10/09/15 0844    Clinical Impression Statement Pt was challenged with corner stretch to open up chest, reported inreased tightness she was able to feel in chest, neck and shoulders. Pt reported increase in low back pain with aerobic exerices that decreased with time, and seemed to "loosen her up." Pt instructed on stretches to add and perform daily.    PT Next Visit Plan Work on lumbar exercises and stretches to open up chest         Problem List Patient Active Problem List   Diagnosis Date Noted  . Back pain 04/12/2014  . Arthritis 04/12/2014  . Knee pain 04/12/2014  . Unspecified essential hypertension 11/27/2013  . Osteoarthritis of left knee  07/16/2012    Crist Fat, SPTA 10/09/2015, 8:50 AM  [place], [place], [address] Phone: [phone]   Fax:  [phone]  Name: Margaret [name] MRN: [id number] Date of Birth: [birth year]

## 2015-10-12 ENCOUNTER — Ambulatory Visit: Payer: Federal, State, Local not specified - PPO | Attending: Family Medicine | Admitting: Physical Therapy

## 2015-10-12 DIAGNOSIS — M542 Cervicalgia: Secondary | ICD-10-CM | POA: Diagnosis not present

## 2015-10-12 DIAGNOSIS — R0789 Other chest pain: Secondary | ICD-10-CM | POA: Insufficient documentation

## 2015-10-12 DIAGNOSIS — M545 Low back pain, unspecified: Secondary | ICD-10-CM

## 2015-10-12 NOTE — Patient Instructions (Signed)
Flexibility: Corner Stretch    Standing in corner with hands just above shoulder level and feet ____ inches from corner, lean forward until a comfortable stretch is felt across chest. Hold ____ seconds. Repeat ____ times per set. Do ____ sets per session. Do ____ sessions per day.  http://orth.exer.us/343   Copyright  VHI. All rights reserved.  Pectoral Stretch, Supine on Foam Roller    Lie on back along length of 6 inch full roller and allow arms to spread wide, keeping elbows bent 90. To increase stretch, reach arms over head. Hold ___ seconds. Repeat ___ times per session. Do ___ sessions per day.  Copyright  VHI. All rights reserved.

## 2015-10-12 NOTE — Therapy (Signed)
[place], [place], [address] Phone: [phone]   Fax:  [phone]  Physical Therapy Treatment  Patient Details  Name: Margaret [name] MRN: [id number] Date of Birth: 05-18-1951 Referring Provider: Cari Caraway  Encounter Date: 10/12/2015      PT End of Session - 10/12/15 1658    Visit Number 10   PT Start Time [id number]   PT Stop Time 1700   PT Time Calculation (min) 41 min      Past Medical History  Diagnosis Date  . Anemia   . Dyspareunia   . Endometriosis   . Osteopenia   . Urinary incontinence   . Hx of abnormal Pap smear 1980's  . Allergy   . Ulcer     Past Surgical History  Procedure Laterality Date  . Abdominal hysterectomy  09/2008    TVH/BSO--Dr. Quincy Simmonds with TVT  . Bladder suspension  09/2008    Dr. Quincy Simmonds  . Cervix lesion destruction  1980    hx abnormal pap--dysplasia    There were no vitals filed for this visit.  Visit Diagnosis:  Neck pain  Chest wall pain  Midline low back pain without sciatica      Subjective Assessment - 10/12/15 1637    Subjective Pt reports pain in her chest is inhibiting her breathing, she has made an appointment next week to meet with her primarcy care physician to look at it. Reports her low back pain comes and goes but her knee is feeling much better.    Currently in Pain? Yes   Pain Score 7    Pain Location Chest   Pain Orientation Mid   Pain Descriptors / Indicators Aching   Pain Type Acute pain   Pain Onset 1 to 4 weeks ago                         [facility] Adult PT Treatment/Exercise - 10/12/15 0001    Neck Exercises: Machines for Strengthening   Cybex Row 25# 2 sets 15   Other Machines for Strengthening lat pull 25# 2 sets 15   Neck Exercises: Standing   Other Standing Exercises Corner stretch 3 levels 30 sec x 2    Other Standing Exercises internal rotation door stretch,  star gazer stretch    Manual  Therapy   Manual Therapy Passive ROM   Manual therapy comments lumbar and chest                 PT Education - 10/12/15 1703    Education Details Provided pt with chest stretch exercises          PT Short Term Goals - 09/07/15 0846    PT SHORT TERM GOAL #1   Title independent with initial HEP   Status Achieved           PT Long Term Goals - 10/09/15 0846    PT LONG TERM GOAL #1   Title understand proper posture and body mechanics   Status Achieved   PT LONG TERM GOAL #2   Title decrease pain 50%   Status Achieved   PT LONG TERM GOAL #3   Title increase ROM 50%   Status On-going   PT LONG TERM GOAL #4   Title tolerate work > 6 hours   Status On-going               Plan - 10/12/15 1703  Clinical Impression Statement Pt arrived late to therapy. Pt reported increased pain with scapular retraction that caused shortness of breath due to pain in the chest. Focused on stretches and exercises to open up chest. Pt declined modalities and asked to continue stretches.    PT Next Visit Plan Work on lumbar and chest flexibiility         Problem List Patient Active Problem List   Diagnosis Date Noted  . Back pain 04/12/2014  . Arthritis 04/12/2014  . Knee pain 04/12/2014  . Unspecified essential hypertension 11/27/2013  . Osteoarthritis of left knee 07/16/2012    Crist Fat, SPTA 10/12/2015, 5:07 PM  [place], [place], [address] Phone: [phone]   Fax:  [phone]  Name: Margaret [name] MRN: [id number] Date of Birth: Nov 29, 1950

## 2015-10-17 ENCOUNTER — Ambulatory Visit: Payer: Federal, State, Local not specified - PPO | Admitting: Physical Therapy

## 2015-10-19 ENCOUNTER — Encounter: Payer: Self-pay | Admitting: Physical Therapy

## 2015-10-19 ENCOUNTER — Ambulatory Visit: Payer: Federal, State, Local not specified - PPO | Admitting: Physical Therapy

## 2015-10-19 DIAGNOSIS — M545 Low back pain, unspecified: Secondary | ICD-10-CM

## 2015-10-19 DIAGNOSIS — R0789 Other chest pain: Secondary | ICD-10-CM

## 2015-10-19 DIAGNOSIS — M542 Cervicalgia: Secondary | ICD-10-CM

## 2015-10-19 NOTE — Therapy (Signed)
[place], [place], [address] Phone: [phone]   Fax:  [phone]  Physical Therapy Treatment  Patient Details  Name: Margaret [name] MRN: [id number] Date of Birth: 09-Apr-1951 Referring Provider: Cari Caraway  Encounter Date: 10/19/2015      PT End of Session - 10/19/15 0928    Visit Number 11   Date for PT Re-Evaluation 11/05/15   PT Start Time 0852   PT Stop Time 0930   PT Time Calculation (min) 38 min   Activity Tolerance Patient tolerated treatment well   Behavior During Therapy [facility] for tasks assessed/performed      Past Medical History  Diagnosis Date  . Anemia   . Dyspareunia   . Endometriosis   . Osteopenia   . Urinary incontinence   . Hx of abnormal Pap smear 1980's  . Allergy   . Ulcer     Past Surgical History  Procedure Laterality Date  . Abdominal hysterectomy  09/2008    TVH/BSO--Dr. Quincy Simmonds with TVT  . Bladder suspension  09/2008    Dr. Quincy Simmonds  . Cervix lesion destruction  1980    hx abnormal pap--dysplasia    There were no vitals filed for this visit.  Visit Diagnosis:  Midline low back pain without sciatica  Chest wall pain  Neck pain      Subjective Assessment - 10/19/15 0854    Subjective Pt reports that she is going to the MD for her chest pain. L shoulder is getting better.    Currently in Pain? No/denies   Pain Score 0-No pain            [facility] PT Assessment - 10/19/15 0001    AROM   Overall AROM Comments Shoulder AROM WNL, Cervical and lumbar ROM WNL, Some pulling in L shoulder area with cervical rotation to R                       [facility] Adult PT Treatment/Exercise - 10/19/15 0001    Neck Exercises: Machines for Strengthening   UBE (Upper Arm Bike) L3.5 9frd/3rev   Cybex Row 25# 2 sets 15   Other Machines for Strengthening lat pull 25# 2 sets 15   Neck Exercises: Standing   Wall Push Ups 20 reps   Other Standing  Exercises Corner stretch 5 reps 10 sec    Lumbar Exercises: Standing   Other Standing Lumbar Exercises Standing shoulder horizontal Abd x 20 yellow Tband; Shoulder ER yellow Tband x20   Shoulder Exercises: Standing   Flexion 10 reps;Both   Shoulder Flexion Weight (lbs) 2   ABduction 10 reps;Weights   Shoulder ABduction Weight (lbs) 2                  PT Short Term Goals - 09/07/15 0846    PT SHORT TERM GOAL #1   Title independent with initial HEP   Status Achieved           PT Long Term Goals - 10/19/15 0929    PT LONG TERM GOAL #3   Title increase ROM 50%   Status Achieved               Plan - 10/19/15 0928    Clinical Impression Statement Pt 7 minutes late fro PT treatment, Reports overall improvement but continues to have chest wall pain. Cervical and lumbar ROM has improved. Hypo verbal during treatment requires min cues to  redirect.    Pt will benefit from skilled therapeutic intervention in order to improve on the following deficits Decreased mobility;Decreased range of motion;Decreased strength;Increased muscle spasms;Pain   Rehab Potential Good   PT Frequency 2x / week   PT Duration 8 weeks   PT Treatment/Interventions Electrical Stimulation;Moist Heat;Therapeutic exercise;Manual techniques;Ultrasound;Patient/family education   PT Next Visit Plan Work on lumbar and chest flexibiility         Problem List Patient Active Problem List   Diagnosis Date Noted  . Back pain 04/12/2014  . Arthritis 04/12/2014  . Knee pain 04/12/2014  . Unspecified essential hypertension 11/27/2013  . Osteoarthritis of left knee 07/16/2012    Scot Jun, PTA  10/19/2015, 9:31 AM  [place], [place], [address] Phone: [phone]   Fax:  [phone]  Name: Margaret [name] MRN: [id number] Date of Birth: Mar 29, 1951

## 2015-10-24 ENCOUNTER — Ambulatory Visit: Payer: Federal, State, Local not specified - PPO | Admitting: Physical Therapy

## 2015-10-24 ENCOUNTER — Encounter: Payer: Self-pay | Admitting: Physical Therapy

## 2015-10-24 DIAGNOSIS — M545 Low back pain, unspecified: Secondary | ICD-10-CM

## 2015-10-24 DIAGNOSIS — R0789 Other chest pain: Secondary | ICD-10-CM

## 2015-10-24 DIAGNOSIS — M542 Cervicalgia: Secondary | ICD-10-CM

## 2015-10-24 NOTE — Therapy (Signed)
[place], [place], [address] Phone: [phone]   Fax:  [phone]  Physical Therapy Treatment  Patient Details  Name: Margaret [name] MRN: [id number] Date of Birth: [birth year] Referring Provider: Cari Caraway  Encounter Date: 10/24/2015      PT End of Session - 10/24/15 0927    Visit Number 12   Date for PT Re-Evaluation 11/05/15   PT Start Time 0850   PT Stop Time 0929   PT Time Calculation (min) 39 min   Activity Tolerance Patient tolerated treatment well   Behavior During Therapy [facility] for tasks assessed/performed      Past Medical History  Diagnosis Date  . Anemia   . Dyspareunia   . Endometriosis   . Osteopenia   . Urinary incontinence   . Hx of abnormal Pap smear 1980's  . Allergy   . Ulcer     Past Surgical History  Procedure Laterality Date  . Abdominal hysterectomy  09/2008    TVH/BSO--Dr. Quincy Simmonds with TVT  . Bladder suspension  09/2008    Dr. Quincy Simmonds  . Cervix lesion destruction  1980    hx abnormal pap--dysplasia    There were no vitals filed for this visit.  Visit Diagnosis:  Neck pain  Chest wall pain  Midline low back pain without sciatica      Subjective Assessment - 10/24/15 0849    Subjective Pt reports that she is feeling better, Her chest pain is not as bad.    Currently in Pain? No/denies   Pain Score 0-No pain                         [facility] Adult PT Treatment/Exercise - 10/24/15 0001    Neck Exercises: Machines for Strengthening   UBE (Upper Arm Bike) L3.5 69fd/3rev   Cybex Row 25# 2 sets 15   Other Machines for Strengthening lat pull 25# 2 sets 15   Neck Exercises: Standing   Wall Push Ups 20 reps   Other Standing Exercises Corner stretch 5 reps 10 sec    Lumbar Exercises: Standing   Other Standing Lumbar Exercises Standing shoulder horizontal Abd x 20 yellow Tband; Shoulder ER yellow Tband x20   Shoulder Exercises:  Standing   Flexion 10 reps;Both  2 sets    Shoulder Flexion Weight (lbs) 2   ABduction 10 reps;Weights   Shoulder ABduction Weight (lbs) 2   Extension 15 reps;Theraband   Theraband Level (Shoulder Extension) Level 3 ([place]                  PT Short Term Goals - 09/07/15 0846    PT SHORT TERM GOAL #1   Title independent with initial HEP   Status Achieved           PT Long Term Goals - 10/24/15 0927    PT LONG TERM GOAL #4   Title tolerate work > 6 hours   Status Partially Met               Plan - 10/24/15 0928    Clinical Impression Statement Pt 5 minutes late for PT treatment, Reports decrease chest pain and able to complete all interventions. Continues to require cues to redirect due to being hypo verbal during treatment.   Pt will benefit from skilled therapeutic intervention in order to improve on the following deficits Decreased mobility;Decreased range of motion;Decreased strength;Increased muscle spasms;Pain  Rehab Potential Good   PT Frequency 2x / week   PT Duration 8 weeks   PT Treatment/Interventions Electrical Stimulation;Moist Heat;Therapeutic exercise;Manual techniques;Ultrasound;Patient/family education   PT Next Visit Plan D/C if pt continues to do well,        Problem List Patient Active Problem List   Diagnosis Date Noted  . Back pain 04/12/2014  . Arthritis 04/12/2014  . Knee pain 04/12/2014  . Unspecified essential hypertension 11/27/2013  . Osteoarthritis of left knee 07/16/2012    Scot Jun 10/24/2015, 9:32 AM  [place], [place], [address] Phone: [phone]   Fax:  [phone]  Name: Margaret [name] MRN: [id number] Date of Birth: [birth year]

## 2015-10-26 ENCOUNTER — Ambulatory Visit: Payer: Federal, State, Local not specified - PPO | Admitting: Physical Therapy

## 2015-10-26 ENCOUNTER — Encounter: Payer: Self-pay | Admitting: Physical Therapy

## 2015-10-26 DIAGNOSIS — M542 Cervicalgia: Secondary | ICD-10-CM

## 2015-10-26 DIAGNOSIS — M545 Low back pain, unspecified: Secondary | ICD-10-CM

## 2015-10-26 DIAGNOSIS — R0789 Other chest pain: Secondary | ICD-10-CM

## 2015-10-26 NOTE — Therapy (Signed)
[place], [place], [address] Phone: [phone]   Fax:  [phone]  Physical Therapy Treatment  Patient Details  Name: Margaret [name] MRN: [id number] Date of Birth: [birth year] Referring Provider: Cari Caraway  Encounter Date: 10/26/2015      PT End of Session - 10/26/15 0927    Visit Number 13   Date for PT Re-Evaluation 11/05/15   PT Start Time 0848   PT Stop Time 0927   PT Time Calculation (min) 39 min   Activity Tolerance Patient tolerated treatment well   Behavior During Therapy [name] for tasks assessed/performed      Past Medical History  Diagnosis Date  . Anemia   . Dyspareunia   . Endometriosis   . Osteopenia   . Urinary incontinence   . Hx of abnormal Pap smear 1980's  . Allergy   . Ulcer     Past Surgical History  Procedure Laterality Date  . Abdominal hysterectomy  09/2008    TVH/BSO--Dr. Quincy Simmonds with TVT  . Bladder suspension  09/2008    Dr. Quincy Simmonds  . Cervix lesion destruction  1980    hx abnormal pap--dysplasia    There were no vitals filed for this visit.  Visit Diagnosis:  Midline low back pain without sciatica  Chest wall pain  Neck pain      Subjective Assessment - 10/26/15 0852    Subjective Pt report that her chest pain comes and goes. Aching pain in upper back area and chest pain.   Currently in Pain? Yes   Pain Score 3    Pain Location Chest  and back                         [facility] Adult PT Treatment/Exercise - 10/26/15 0001    Neck Exercises: Machines for Strengthening   UBE (Upper Arm Bike) L3.5 45fd/3rev   Cybex Row 25# 2 sets 15   Other Machines for Strengthening lat pull 25# 2 sets 15   Neck Exercises: Standing   Wall Push Ups 20 reps   Lumbar Exercises: Standing   Other Standing Lumbar Exercises Standing shoulder horizontal Abd x 20 yellow Tband; Shoulder ER yellow Tband x20   Shoulder Exercises: Standing   Flexion Both;15 reps;Weights   Shoulder Flexion Weight (lbs) 2   ABduction 15 reps;Weights;Both   Shoulder ABduction Weight (lbs) 2   Extension Theraband;20 reps   Theraband Level (Shoulder Extension) Level 3 (Green)   Shoulder Exercises: Stretch   Corner Stretch 5 reps;10 seconds                  PT Short Term Goals - 09/07/15 0846    PT SHORT TERM GOAL #1   Title independent with initial HEP   Status Achieved           PT Long Term Goals - 10/24/15 0927    PT LONG TERM GOAL #4   Title tolerate work > 6 hours   Status Partially Met               Plan - 10/26/15 0927    Clinical Impression Statement Performed all interventions well, does reports increase discomfort compared to previous treatment. Pt reports that she will be seeing the MD next week and will be on vacation.   Pt will benefit from skilled therapeutic intervention in order to improve on the following deficits Decreased mobility;Decreased range of motion;Decreased strength;Increased  muscle spasms;Pain   Rehab Potential Good   PT Frequency 2x / week   PT Duration 8 weeks   PT Treatment/Interventions Electrical Stimulation;Moist Heat;Therapeutic exercise;Manual techniques;Ultrasound;Patient/family education   PT Next Visit Plan Will place pt on a 2 week hold. See MD next week will call back to schedule more appointments if needed.        Problem List Patient Active Problem List   Diagnosis Date Noted  . Back pain 04/12/2014  . Arthritis 04/12/2014  . Knee pain 04/12/2014  . Unspecified essential hypertension 11/27/2013  . Osteoarthritis of left knee 07/16/2012    Scot Jun, PTA   10/26/2015, 9:31 AM  [place], [place], [address] Phone: [phone]   Fax:  [phone]  Name: Margaret [name] MRN: [id number] Date of Birth: [birth year]

## 2015-10-31 ENCOUNTER — Ambulatory Visit (INDEPENDENT_AMBULATORY_CARE_PROVIDER_SITE_OTHER): Payer: Federal, State, Local not specified - PPO | Admitting: Orthopedic Surgery

## 2015-10-31 VITALS — BP 115/68 | Ht 64.0 in | Wt 164.0 lb

## 2015-10-31 DIAGNOSIS — M791 Myalgia: Secondary | ICD-10-CM

## 2015-10-31 DIAGNOSIS — M25562 Pain in left knee: Secondary | ICD-10-CM | POA: Diagnosis not present

## 2015-10-31 DIAGNOSIS — M7918 Myalgia, other site: Secondary | ICD-10-CM
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
## 2015-11-17 ENCOUNTER — Other Ambulatory Visit: Payer: Self-pay | Admitting: Gastroenterology

## 2016-01-04 ENCOUNTER — Ambulatory Visit: Payer: Federal, State, Local not specified - PPO | Admitting: Orthopedic Surgery

## 2016-01-23 ENCOUNTER — Ambulatory Visit (INDEPENDENT_AMBULATORY_CARE_PROVIDER_SITE_OTHER): Payer: Federal, State, Local not specified - PPO | Admitting: Orthopedic Surgery

## 2016-01-23 DIAGNOSIS — M545 Low back pain, unspecified: Secondary | ICD-10-CM

## 2016-01-23 DIAGNOSIS — S8012XA Contusion of left lower leg, initial encounter: Secondary | ICD-10-CM

## 2016-01-23 DIAGNOSIS — M791 Myalgia: Secondary | ICD-10-CM | POA: Diagnosis not present

## 2016-01-23 DIAGNOSIS — M7918 Myalgia, other site: Secondary | ICD-10-CM
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
## 2016-02-20 ENCOUNTER — Other Ambulatory Visit: Payer: Self-pay

## 2016-02-20 DIAGNOSIS — Z1231 Encounter for screening mammogram for malignant neoplasm of breast: Secondary | ICD-10-CM

## 2016-04-03 ENCOUNTER — Emergency Department (HOSPITAL_BASED_OUTPATIENT_CLINIC_OR_DEPARTMENT_OTHER)
Admission: EM | Admit: 2016-04-03 | Discharge: 2016-04-03 | Disposition: A | Discharge status: Home / Self Care | Payer: Federal, State, Local not specified - PPO | Attending: Emergency Medicine | Admitting: Emergency Medicine

## 2016-04-03 ENCOUNTER — Emergency Department (HOSPITAL_BASED_OUTPATIENT_CLINIC_OR_DEPARTMENT_OTHER): Payer: Federal, State, Local not specified - PPO

## 2016-04-03 ENCOUNTER — Encounter (HOSPITAL_BASED_OUTPATIENT_CLINIC_OR_DEPARTMENT_OTHER): Payer: Self-pay | Admitting: Emergency Medicine

## 2016-04-03 DIAGNOSIS — Z79899 Other long term (current) drug therapy: Secondary | ICD-10-CM | POA: Insufficient documentation

## 2016-04-03 DIAGNOSIS — M549 Dorsalgia, unspecified: Secondary | ICD-10-CM | POA: Insufficient documentation

## 2016-04-03 DIAGNOSIS — R0789 Other chest pain: Secondary | ICD-10-CM | POA: Insufficient documentation

## 2016-04-03 DIAGNOSIS — R079 Chest pain, unspecified: Secondary | ICD-10-CM

## 2016-04-03 LAB — CBC
HCT: 38 % (ref 36.0–46.0)
Hemoglobin: 12.1 g/dL (ref 12.0–15.0)
MCH: 26.6 pg (ref 26.0–34.0)
MCHC: 31.8 g/dL (ref 30.0–36.0)
MCV: 83.5 fL (ref 78.0–100.0)
Platelets: 291 10*3/uL (ref 150–400)
RBC: 4.55 MIL/uL (ref 3.87–5.11)
RDW: 14.9 % (ref 11.5–15.5)
WBC: 5.1 10*3/uL (ref 4.0–10.5)

## 2016-04-03 LAB — BASIC METABOLIC PANEL
Anion gap: 8 (ref 5–15)
BUN: 11 mg/dL (ref 6–20)
CO2: 30 mmol/L (ref 22–32)
Calcium: 9.4 mg/dL (ref 8.9–10.3)
Chloride: 100 mmol/L — ABNORMAL LOW (ref 101–111)
Creatinine, Ser: 0.67 mg/dL (ref 0.44–1.00)
GFR calc Af Amer: >60 mL/min (ref >60)
GFR calc non Af Amer: >60 mL/min (ref >60)
Glucose, Bld: 92 mg/dL (ref 65–99)
Potassium: 3.4 mmol/L — ABNORMAL LOW (ref 3.5–5.1)
Sodium: 138 mmol/L (ref 135–145)

## 2016-04-03 LAB — TROPONIN I: Troponin I: <0.03 ng/mL (ref <0.031)

## 2016-04-03 NOTE — Discharge Instructions (Signed)
Nonspecific Chest Pain  °Chest pain can be caused by many different conditions. There is always a chance that your pain could be related to something serious, such as a heart attack or a blood clot in your lungs. Chest pain can also be caused by conditions that are not life-threatening. If you have chest pain, it is very important to follow up with your health care provider. °CAUSES  °Chest pain can be caused by: °· Heartburn. °· Pneumonia or bronchitis. °· Anxiety or stress. °· Inflammation around your heart (pericarditis) or lung (pleuritis or pleurisy). °· A blood clot in your lung. °· A collapsed lung (pneumothorax). It can develop suddenly on its own (spontaneous pneumothorax) or from trauma to the chest. °· Shingles infection (varicella-zoster virus). °· Heart attack. °· Damage to the bones, muscles, and cartilage that make up your chest wall. This can include: °¨ Bruised bones due to injury. °¨ Strained muscles or cartilage due to frequent or repeated coughing or overwork. °¨ Fracture to one or more ribs. °¨ Sore cartilage due to inflammation (costochondritis). °RISK FACTORS  °Risk factors for chest pain may include: °· Activities that increase your risk for trauma or injury to your chest. °· Respiratory infections or conditions that cause frequent coughing. °· Medical conditions or overeating that can cause heartburn. °· Heart disease or family history of heart disease. °· Conditions or health behaviors that increase your risk of developing a blood clot. °· Having had chicken pox (varicella zoster). °SIGNS AND SYMPTOMS °Chest pain can feel like: °· Burning or tingling on the surface of your chest or deep in your chest. °· Crushing, pressure, aching, or squeezing pain. °· Dull or sharp pain that is worse when you move, cough, or take a deep breath. °· Pain that is also felt in your back, neck, shoulder, or arm, or pain that spreads to any of these areas. °Your chest pain may come and go, or it may stay  constant. °DIAGNOSIS °Lab tests or other studies may be needed to find the cause of your pain. Your health care provider may have you take a test called an ambulatory ECG (electrocardiogram). An ECG records your heartbeat patterns at the time the test is performed. You may also have other tests, such as: °· Transthoracic echocardiogram (TTE). During echocardiography, sound waves are used to create a picture of all of the heart structures and to look at how blood flows through your heart. °· Transesophageal echocardiogram (TEE). This is a more advanced imaging test that obtains images from inside your body. It allows your health care provider to see your heart in finer detail. °· Cardiac monitoring. This allows your health care provider to monitor your heart rate and rhythm in real time. °· Holter monitor. This is a portable device that records your heartbeat and can help to diagnose abnormal heartbeats. It allows your health care provider to track your heart activity for several days, if needed. °· Stress tests. These can be done through exercise or by taking medicine that makes your heart beat more quickly. °· Blood tests. °· Imaging tests. °TREATMENT  °Your treatment depends on what is causing your chest pain. Treatment may include: °· Medicines. These may include: °¨ Acid blockers for heartburn. °¨ Anti-inflammatory medicine. °¨ Pain medicine for inflammatory conditions. °¨ Antibiotic medicine, if an infection is present. °¨ Medicines to dissolve blood clots. °¨ Medicines to treat coronary artery disease. °· Supportive care for conditions that do not require medicines. This may include: °¨ Resting. °¨ Applying heat   or cold packs to injured areas. °¨ Limiting activities until pain decreases. °HOME CARE INSTRUCTIONS °· If you were prescribed an antibiotic medicine, finish it all even if you start to feel better. °· Avoid any activities that bring on chest pain. °· Do not use any tobacco products, including  cigarettes, chewing tobacco, or electronic cigarettes. If you need help quitting, ask your health care provider. °· Do not drink alcohol. °· Take medicines only as directed by your health care provider. °· Keep all follow-up visits as directed by your health care provider. This is important. This includes any further testing if your chest pain does not go away. °· If heartburn is the cause for your chest pain, you may be told to keep your head raised (elevated) while sleeping. This reduces the chance that acid will go from your stomach into your esophagus. °· Make lifestyle changes as directed by your health care provider. These may include: °¨ Getting regular exercise. Ask your health care provider to suggest some activities that are safe for you. °¨ Eating a heart-healthy diet. A registered dietitian can help you to learn healthy eating options. °¨ Maintaining a healthy weight. °¨ Managing diabetes, if necessary. °¨ Reducing stress. °SEEK MEDICAL CARE IF: °· Your chest pain does not go away after treatment. °· You have a rash with blisters on your chest. °· You have a fever. °SEEK IMMEDIATE MEDICAL CARE IF:  °· Your chest pain is worse. °· You have an increasing cough, or you cough up blood. °· You have severe abdominal pain. °· You have severe weakness. °· You faint. °· You have chills. °· You have sudden, unexplained chest discomfort. °· You have sudden, unexplained discomfort in your arms, back, neck, or jaw. °· You have shortness of breath at any time. °· You suddenly start to sweat, or your skin gets clammy. °· You feel nauseous or you vomit. °· You suddenly feel light-headed or dizzy. °· Your heart begins to beat quickly, or it feels like it is skipping beats. °These symptoms may represent a serious problem that is an emergency. Do not wait to see if the symptoms will go away. Get medical help right away. Call your local emergency services (911 in the [place] Do not drive yourself to the hospital. °  °This  information is not intended to replace advice given to you by your health care provider. Make sure you discuss any questions you have with your health care provider. °  °Document Released: 08/07/2005 Document Revised: 11/18/2014 Document Reviewed: 06/03/2014 °Elsevier Interactive Patient Education ©2016 [employer] ° °
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
## 2016-04-06 ENCOUNTER — Emergency Department (HOSPITAL_BASED_OUTPATIENT_CLINIC_OR_DEPARTMENT_OTHER)
Admission: EM | Admit: 2016-04-06 | Discharge: 2016-04-06 | Disposition: A | Discharge status: Home / Self Care | Payer: Federal, State, Local not specified - PPO | Attending: Emergency Medicine | Admitting: Emergency Medicine

## 2016-04-06 ENCOUNTER — Encounter (HOSPITAL_BASED_OUTPATIENT_CLINIC_OR_DEPARTMENT_OTHER): Payer: Self-pay | Admitting: Emergency Medicine

## 2016-04-06 ENCOUNTER — Emergency Department (HOSPITAL_BASED_OUTPATIENT_CLINIC_OR_DEPARTMENT_OTHER): Payer: Federal, State, Local not specified - PPO

## 2016-04-06 DIAGNOSIS — R0789 Other chest pain: Secondary | ICD-10-CM | POA: Diagnosis present

## 2016-04-06 DIAGNOSIS — Z7982 Long term (current) use of aspirin: Secondary | ICD-10-CM | POA: Insufficient documentation

## 2016-04-06 DIAGNOSIS — Z79899 Other long term (current) drug therapy: Secondary | ICD-10-CM | POA: Diagnosis not present

## 2016-04-06 DIAGNOSIS — I10 Essential (primary) hypertension: Secondary | ICD-10-CM | POA: Diagnosis not present

## 2016-04-06 DIAGNOSIS — K219 Gastro-esophageal reflux disease without esophagitis: Secondary | ICD-10-CM | POA: Diagnosis not present

## 2016-04-06 HISTORY — DX: Pure hypercholesterolemia, unspecified: E78.00

## 2016-04-06 HISTORY — DX: Essential (primary) hypertension: I10

## 2016-04-06 LAB — COMPREHENSIVE METABOLIC PANEL
ALT: 13 U/L — ABNORMAL LOW (ref 14–54)
AST: 20 U/L (ref 15–41)
Albumin: 4 g/dL (ref 3.5–5.0)
Alkaline Phosphatase: 96 U/L (ref 38–126)
Anion gap: 7 (ref 5–15)
BUN: 11 mg/dL (ref 6–20)
CO2: 30 mmol/L (ref 22–32)
Calcium: 9.3 mg/dL (ref 8.9–10.3)
Chloride: 101 mmol/L (ref 101–111)
Creatinine, Ser: 0.67 mg/dL (ref 0.44–1.00)
GFR calc Af Amer: >60 mL/min (ref >60)
GFR calc non Af Amer: >60 mL/min (ref >60)
Glucose, Bld: 94 mg/dL (ref 65–99)
Potassium: 3.5 mmol/L (ref 3.5–5.1)
Sodium: 138 mmol/L (ref 135–145)
Total Bilirubin: 0.6 mg/dL (ref 0.3–1.2)
Total Protein: 7.9 g/dL (ref 6.5–8.1)

## 2016-04-06 LAB — LIPASE, BLOOD: Lipase: 21 U/L (ref 11–51)

## 2016-04-06 LAB — CBC
HCT: 37.4 % (ref 36.0–46.0)
Hemoglobin: 12 g/dL (ref 12.0–15.0)
MCH: 26.8 pg (ref 26.0–34.0)
MCHC: 32.1 g/dL (ref 30.0–36.0)
MCV: 83.5 fL (ref 78.0–100.0)
Platelets: 278 10*3/uL (ref 150–400)
RBC: 4.48 MIL/uL (ref 3.87–5.11)
RDW: 14.9 % (ref 11.5–15.5)
WBC: 4.7 10*3/uL (ref 4.0–10.5)

## 2016-04-06 LAB — TROPONIN I
Troponin I: <0.03 ng/mL (ref <0.031)
Troponin I: <0.03 ng/mL (ref <0.031)

## 2016-04-06 MED ORDER — PANTOPRAZOLE SODIUM 20 MG PO TBEC
20.0000 mg | DELAYED_RELEASE_TABLET | Freq: Every day | ORAL | Status: DC
Start: 2016-04-06 — End: 2017-06-11

## 2016-04-06 MED ORDER — PANTOPRAZOLE SODIUM 40 MG IV SOLR
40.0000 mg | Freq: Once | INTRAVENOUS | Status: AC
  Administered 2016-04-06: 40 mg via INTRAVENOUS
  Filled 2016-04-06: qty 40

## 2016-04-06 MED ORDER — GI COCKTAIL ~~LOC~~
30.0000 mL | Freq: Once | ORAL | Status: AC
  Administered 2016-04-06: 30 mL via ORAL
  Filled 2016-04-06: qty 30

## 2016-04-06 NOTE — ED Notes (Signed)
EBP made aware that ultrasound is in at 1400.
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
## 2016-04-06 NOTE — ED Provider Notes (Signed)
Complains of intermittent chest pain for the past 3 days points to subxiphoid area pain lasts 1-2 minutes at a time, nonradiating. Is improved with exertion. Other associated symptoms include generalized weakness for 3 days no nausea or sweatiness. Denies shortness of breath. No other associated symptoms. Presently asymptomatic on exam no distress lungs clear auscultation heart regular rate and rhythm abdomen nondistended nontender  Orlie Dakin, MD 04/06/16 1436

## 2016-04-06 NOTE — Discharge Instructions (Signed)
Read the information below.  Use the prescribed medication as directed.  Please discuss all new medications with your pharmacist.  You may return to the Emergency Department at any time for worsening condition or any new symptoms that concern you.    If you develop worsening chest pain, shortness of breath, fever, you pass out, or become weak or dizzy, return to the ER for a recheck.      Nonspecific Chest Pain  Chest pain can be caused by many different conditions. There is always a chance that your pain could be related to something serious, such as a heart attack or a blood clot in your lungs. Chest pain can also be caused by conditions that are not life-threatening. If you have chest pain, it is very important to follow up with your health care provider. CAUSES  Chest pain can be caused by:  Heartburn.  Pneumonia or bronchitis.  Anxiety or stress.  Inflammation around your heart (pericarditis) or lung (pleuritis or pleurisy).  A blood clot in your lung.  A collapsed lung (pneumothorax). It can develop suddenly on its own (spontaneous pneumothorax) or from trauma to the chest.  Shingles infection (varicella-zoster virus).  Heart attack.  Damage to the bones, muscles, and cartilage that make up your chest wall. This can include:  Bruised bones due to injury.  Strained muscles or cartilage due to frequent or repeated coughing or overwork.  Fracture to one or more ribs.  Sore cartilage due to inflammation (costochondritis). RISK FACTORS  Risk factors for chest pain may include:  Activities that increase your risk for trauma or injury to your chest.  Respiratory infections or conditions that cause frequent coughing.  Medical conditions or overeating that can cause heartburn.  Heart disease or family history of heart disease.  Conditions or health behaviors that increase your risk of developing a blood clot.  Having had chicken pox (varicella zoster). SIGNS AND  SYMPTOMS Chest pain can feel like:  Burning or tingling on the surface of your chest or deep in your chest.  Crushing, pressure, aching, or squeezing pain.  Dull or sharp pain that is worse when you move, cough, or take a deep breath.  Pain that is also felt in your back, neck, shoulder, or arm, or pain that spreads to any of these areas. Your chest pain may come and go, or it may stay constant. DIAGNOSIS Lab tests or other studies may be needed to find the cause of your pain. Your health care provider may have you take a test called an ambulatory ECG (electrocardiogram). An ECG records your heartbeat patterns at the time the test is performed. You may also have other tests, such as:  Transthoracic echocardiogram (TTE). During echocardiography, sound waves are used to create a picture of all of the heart structures and to look at how blood flows through your heart.  Transesophageal echocardiogram (TEE).This is a more advanced imaging test that obtains images from inside your body. It allows your health care provider to see your heart in finer detail.  Cardiac monitoring. This allows your health care provider to monitor your heart rate and rhythm in real time.  Holter monitor. This is a portable device that records your heartbeat and can help to diagnose abnormal heartbeats. It allows your health care provider to track your heart activity for several days, if needed.  Stress tests. These can be done through exercise or by taking medicine that makes your heart beat more quickly.  Blood tests.  Imaging  tests. TREATMENT  Your treatment depends on what is causing your chest pain. Treatment may include:  Medicines. These may include:  Acid blockers for heartburn.  Anti-inflammatory medicine.  Pain medicine for inflammatory conditions.  Antibiotic medicine, if an infection is present.  Medicines to dissolve blood clots.  Medicines to treat coronary artery disease.  Supportive  care for conditions that do not require medicines. This may include:  Resting.  Applying heat or cold packs to injured areas.  Limiting activities until pain decreases. HOME CARE INSTRUCTIONS  If you were prescribed an antibiotic medicine, finish it all even if you start to feel better.  Avoid any activities that bring on chest pain.  Do not use any tobacco products, including cigarettes, chewing tobacco, or electronic cigarettes. If you need help quitting, ask your health care provider.  Do not drink alcohol.  Take medicines only as directed by your health care provider.  Keep all follow-up visits as directed by your health care provider. This is important. This includes any further testing if your chest pain does not go away.  If heartburn is the cause for your chest pain, you may be told to keep your head raised (elevated) while sleeping. This reduces the chance that acid will go from your stomach into your esophagus.  Make lifestyle changes as directed by your health care provider. These may include:  Getting regular exercise. Ask your health care provider to suggest some activities that are safe for you.  Eating a heart-healthy diet. A registered dietitian can help you to learn healthy eating options.  Maintaining a healthy weight.  Managing diabetes, if necessary.  Reducing stress. SEEK MEDICAL CARE IF:  Your chest pain does not go away after treatment.  You have a rash with blisters on your chest.  You have a fever. SEEK IMMEDIATE MEDICAL CARE IF:   Your chest pain is worse.  You have an increasing cough, or you cough up blood.  You have severe abdominal pain.  You have severe weakness.  You faint.  You have chills.  You have sudden, unexplained chest discomfort.  You have sudden, unexplained discomfort in your arms, back, neck, or jaw.  You have shortness of breath at any time.  You suddenly start to sweat, or your skin gets clammy.  You feel  nauseous or you vomit.  You suddenly feel light-headed or dizzy.  Your heart begins to beat quickly, or it feels like it is skipping beats. These symptoms may represent a serious problem that is an emergency. Do not wait to see if the symptoms will go away. Get medical help right away. Call your local emergency services (911 in the [place] Do not drive yourself to the hospital.   This information is not intended to replace advice given to you by your health care provider. Make sure you discuss any questions you have with your health care provider.   Document Released: 08/07/2005 Document Revised: 11/18/2014 Document Reviewed: 06/03/2014 Elsevier Interactive Patient Education 2016 [place] for Gastroesophageal Reflux Disease, Adult When you have gastroesophageal reflux disease (GERD), the foods you eat and your eating habits are very important. Choosing the right foods can help ease the discomfort of GERD. WHAT GENERAL GUIDELINES DO I NEED TO FOLLOW?  Choose fruits, vegetables, whole grains, low-fat dairy products, and low-fat meat, fish, and poultry.  Limit fats such as oils, salad dressings, butter, nuts, and avocado.  Keep a food diary to identify foods that cause symptoms.  Avoid foods  that cause reflux. These may be different for different people.  Eat frequent small meals instead of three large meals each day.  Eat your meals slowly, in a relaxed setting.  Limit fried foods.  Cook foods using methods other than frying.  Avoid drinking alcohol.  Avoid drinking large amounts of liquids with your meals.  Avoid bending over or lying down until 2-3 hours after eating. WHAT FOODS ARE NOT RECOMMENDED? The following are some foods and drinks that may worsen your symptoms: Vegetables Tomatoes. Tomato juice. Tomato and spaghetti sauce. Chili peppers. Onion and garlic. Horseradish. Fruits Oranges, grapefruit, and lemon (fruit and juice). Meats High-fat meats,  fish, and poultry. This includes hot dogs, ribs, ham, sausage, salami, and bacon. Dairy Whole milk and chocolate milk. Sour cream. Cream. Butter. Ice cream. Cream cheese.  Beverages Coffee and tea, with or without caffeine. Carbonated beverages or energy drinks. Condiments Hot sauce. Barbecue sauce.  Sweets/Desserts Chocolate and cocoa. Donuts. Peppermint and spearmint. Fats and Oils High-fat foods, including [place] fries and potato chips. Other Vinegar. Strong spices, such as black pepper, white pepper, red pepper, cayenne, curry powder, cloves, ginger, and chili powder. The items listed above may not be a complete list of foods and beverages to avoid. Contact your dietitian for more information.   This information is not intended to replace advice given to you by your health care provider. Make sure you discuss any questions you have with your health care provider.   Document Released: 10/28/2005 Document Revised: 11/18/2014 Document Reviewed: 09/01/2013 Elsevier Interactive Patient Education 2016 [place].  Gastroesophageal Reflux Disease, Adult Normally, food travels down the esophagus and stays in the stomach to be digested. However, when a person has gastroesophageal reflux disease (GERD), food and stomach acid move back up into the esophagus. When this happens, the esophagus becomes sore and inflamed. Over time, GERD can create small holes (ulcers) in the lining of the esophagus.  CAUSES This condition is caused by a problem with the muscle between the esophagus and the stomach (lower esophageal sphincter, or LES). Normally, the LES muscle closes after food passes through the esophagus to the stomach. When the LES is weakened or abnormal, it does not close properly, and that allows food and stomach acid to go back up into the esophagus. The LES can be weakened by certain dietary substances, medicines, and medical conditions, including:  Tobacco use.  Pregnancy.  Having a  hiatal hernia.  Heavy alcohol use.  Certain foods and beverages, such as coffee, chocolate, onions, and peppermint. RISK FACTORS This condition is more likely to develop in:  People who have an increased body weight.  People who have connective tissue disorders.  People who use NSAID medicines. SYMPTOMS Symptoms of this condition include:  Heartburn.  Difficult or painful swallowing.  The feeling of having a lump in the throat.  Abitter taste in the mouth.  Bad breath.  Having a large amount of saliva.  Having an upset or bloated stomach.  Belching.  Chest pain.  Shortness of breath or wheezing.  Ongoing (chronic) cough or a night-time cough.  Wearing away of tooth enamel.  Weight loss. Different conditions can cause chest pain. Make sure to see your health care provider if you experience chest pain. DIAGNOSIS Your health care provider will take a medical history and perform a physical exam. To determine if you have mild or severe GERD, your health care provider may also monitor how you respond to treatment. You may also have other tests,  including:  An endoscopy toexamine your stomach and esophagus with a small camera.  A test thatmeasures the acidity level in your esophagus.  A test thatmeasures how much pressure is on your esophagus.  A barium swallow or modified barium swallow to show the shape, size, and functioning of your esophagus. TREATMENT The goal of treatment is to help relieve your symptoms and to prevent complications. Treatment for this condition may vary depending on how severe your symptoms are. Your health care provider may recommend:  Changes to your diet.  Medicine.  Surgery. HOME CARE INSTRUCTIONS Diet  Follow a diet as recommended by your health care provider. This may involve avoiding foods and drinks such as:  Coffee and tea (with or without caffeine).  Drinks that containalcohol.  Energy drinks and sports  drinks.  Carbonated drinks or sodas.  Chocolate and cocoa.  Peppermint and mint flavorings.  Garlic and onions.  Horseradish.  Spicy and acidic foods, including peppers, chili powder, curry powder, vinegar, hot sauces, and barbecue sauce.  Citrus fruit juices and citrus fruits, such as oranges, lemons, and limes.  Tomato-based foods, such as red sauce, chili, salsa, and pizza with red sauce.  Fried and fatty foods, such as donuts, french fries, potato chips, and high-fat dressings.  High-fat meats, such as hot dogs and fatty cuts of red and white meats, such as rib eye steak, sausage, ham, and bacon.  High-fat dairy items, such as whole milk, butter, and cream cheese.  Eat small, frequent meals instead of large meals.  Avoid drinking large amounts of liquid with your meals.  Avoid eating meals during the 2-3 hours before bedtime.  Avoid lying down right after you eat.  Do not exercise right after you eat. General Instructions  Pay attention to any changes in your symptoms.  Take over-the-counter and prescription medicines only as told by your health care provider. Do not take aspirin, ibuprofen, or other NSAIDs unless your health care provider told you to do so.  Do not use any tobacco products, including cigarettes, chewing tobacco, and e-cigarettes. If you need help quitting, ask your health care provider.  Wear loose-fitting clothing. Do not wear anything tight around your waist that causes pressure on your abdomen.  Raise (elevate) the head of your bed 6 inches (15cm).  Try to reduce your stress, such as with yoga or meditation. If you need help reducing stress, ask your health care provider.  If you are overweight, reduce your weight to an amount that is healthy for you. Ask your health care provider for guidance about a safe weight loss goal.  Keep all follow-up visits as told by your health care provider. This is important. SEEK MEDICAL CARE IF:  You have  new symptoms.  You have unexplained weight loss.  You have difficulty swallowing, or it hurts to swallow.  You have wheezing or a persistent cough.  Your symptoms do not improve with treatment.  You have a hoarse voice. SEEK IMMEDIATE MEDICAL CARE IF:  You have pain in your arms, neck, jaw, teeth, or back.  You feel sweaty, dizzy, or light-headed.  You have chest pain or shortness of breath.  You vomit and your vomit looks like blood or coffee grounds.  You faint.  Your stool is bloody or black.  You cannot swallow, drink, or eat.   This information is not intended to replace advice given to you by your health care provider. Make sure you discuss any questions you have with your health care  provider.   Document Released: 08/07/2005 Document Revised: 07/19/2015 Document Reviewed: 02/22/2015 Elsevier Interactive Patient Education [employer].

## 2016-04-06 NOTE — ED Notes (Signed)
Pt reports central chest pain and left shoulder since earlier this week.  Pt states she was seen on weds for the same and at that time, the pain "came and went".  Pt reports since Wednesday, the pain has been constant along with new onset of SOB, fatigue, mild nausea and heart palpitations described as "occasional shooting pains deeper inside"

## 2016-04-06 NOTE — ED Notes (Signed)
Pt transported to radiology.

## 2016-04-06 NOTE — ED Notes (Signed)
Raquel Sarna, PA-C in room with patient now.

## 2016-05-01 ENCOUNTER — Other Ambulatory Visit: Payer: Self-pay | Admitting: Family Medicine

## 2016-05-01 ENCOUNTER — Ambulatory Visit
Admission: RE | Admit: 2016-05-01 | Discharge: 2016-05-01 | Disposition: A | Discharge status: Home / Self Care | Payer: Federal, State, Local not specified - PPO | Source: Ambulatory Visit

## 2016-05-01 DIAGNOSIS — Z1231 Encounter for screening mammogram for malignant neoplasm of breast: Secondary | ICD-10-CM

## 2016-05-02 ENCOUNTER — Other Ambulatory Visit: Payer: Self-pay | Admitting: Family Medicine

## 2016-05-02 DIAGNOSIS — N644 Mastodynia: Secondary | ICD-10-CM

## 2016-05-15 ENCOUNTER — Ambulatory Visit
Admission: RE | Admit: 2016-05-15 | Discharge: 2016-05-15 | Disposition: A | Discharge status: Home / Self Care | Payer: Federal, State, Local not specified - PPO | Source: Ambulatory Visit | Attending: Family Medicine | Admitting: Family Medicine

## 2016-05-15 DIAGNOSIS — N644 Mastodynia: Secondary | ICD-10-CM

## 2016-08-02 ENCOUNTER — Ambulatory Visit: Payer: Federal, State, Local not specified - PPO | Admitting: Orthopedic Surgery

## 2016-08-14 ENCOUNTER — Encounter: Payer: Self-pay | Admitting: Orthopedic Surgery

## 2016-08-14 ENCOUNTER — Ambulatory Visit (INDEPENDENT_AMBULATORY_CARE_PROVIDER_SITE_OTHER): Payer: Federal, State, Local not specified - PPO | Admitting: Orthopedic Surgery

## 2016-08-14 ENCOUNTER — Ambulatory Visit (INDEPENDENT_AMBULATORY_CARE_PROVIDER_SITE_OTHER): Payer: Federal, State, Local not specified - PPO

## 2016-08-14 VITALS — BP 119/76 | HR 71 | Ht 65.0 in | Wt 163.0 lb

## 2016-08-14 DIAGNOSIS — M25562 Pain in left knee: Secondary | ICD-10-CM

## 2016-08-14 DIAGNOSIS — G8929 Other chronic pain: Secondary | ICD-10-CM

## 2016-08-14 DIAGNOSIS — M5441 Lumbago with sciatica, right side: Secondary | ICD-10-CM | POA: Diagnosis not present

## 2016-08-14 DIAGNOSIS — M5442 Lumbago with sciatica, left side: Secondary | ICD-10-CM

## 2016-08-14 NOTE — Patient Instructions (Signed)
Work note: Ergonomic work side adjustment including adjustable height computer and workstation

## 2016-08-14 NOTE — Progress Notes (Signed)
Patient ID: Margaret [name], female   DOB: [birth year], 65 y.o.   MRN: [id number]  Chief Complaint  Patient presents with  . Leg Pain    Left leg pain, no injury.     HPI Margaret [name] is a 65 y.o. female.  Presents for evaluation of left calf and ankle pain  She's had 32-7 weeks of age medical onset of aching radiating pain associated with leg cramps primarily in the left calf there is some occasional tingling and symptoms sometimes are bilateral. She did have an ultrasound to rule out blood clot that was negative. She has been taking ibuprofen heat and ice  She sees chiropractor once a month for adjustments on her back  She has a home exercise program for lumbar spine stabilization but has ben poorly compliant   Review of Systems Review of Systems  Constitutional: Negative for fever and unexpected weight change.  Gastrointestinal: Negative.   Genitourinary: Negative.     Past Medical History:  Diagnosis Date  . Allergy   . Anemia   . Dyspareunia   . Endometriosis   . High cholesterol   . Hx of abnormal Pap smear 1980's  . Hypertension   . Osteopenia   . Ulcer ([place])   . Urinary incontinence     Past Surgical History:  Procedure Laterality Date  . ABDOMINAL HYSTERECTOMY  09/2008   TVH/BSO--Dr. Quincy Simmonds with TVT  . BLADDER SUSPENSION  09/2008   Dr. Quincy Simmonds  . CERVIX LESION DESTRUCTION  1980   hx abnormal pap--dysplasia    Social History Social History  Substance Use Topics  . Smoking status: Never Smoker  . Smokeless tobacco: Not on file  . Alcohol use Yes     Comment: occasional    Allergies  Allergen Reactions  . Ciprofloxacin Nausea And Vomiting  . Sulfa Antibiotics     Unknown    No outpatient prescriptions have been marked as taking for the 08/14/16 encounter (Office Visit) with Carole Civil, MD.      Physical Exam Physical Exam BP 119/76   Pulse 71   Ht 5\' 5"  (1.651 m)   Wt 163 lb (73.9 kg)   BMI 27.12 kg/m   Gen.  appearance. The patient is well-developed and well-nourished, grooming and hygiene are normal. There are no gross congenital abnormalities  The patient is alert and oriented to person place and time  Mood and affect are normal  Ambulation Normal heel-toe gait  Examination reveals the following: On inspection we find tenderness in the lower back and both  left and right SI joint  both lower legs show tenderness in the calf medially left greater than right anterior compartment but normal sensation normal reflexes knee and ankle. She did have increased pain with dorsiflexion of the left foot  Sensory tests were normal pulses are excellent Stability tests were normal     Data RevieweI ordered a tib-fib x-ray I read that is normal alignment of the bone normal quality of the bone no fracture subluxation dislocation or soft tissue mass  Assessment    Encounter Diagnoses  Name Primary?  Marland Kitchen Arthralgia of left lower leg Yes  . Chronic midline low back pain with bilateral sciatica        Plan    Recommend home exercise program one visit to the therapist for training   follow-up when necessary       Arther Abbott 08/14/2016, 10:16 AM

## 2016-08-20 ENCOUNTER — Ambulatory Visit: Payer: Federal, State, Local not specified - PPO | Attending: Family Medicine | Admitting: Physical Therapy

## 2016-08-20 DIAGNOSIS — G8929 Other chronic pain: Secondary | ICD-10-CM

## 2016-08-20 DIAGNOSIS — M6281 Muscle weakness (generalized): Secondary | ICD-10-CM

## 2016-08-20 DIAGNOSIS — R208 Other disturbances of skin sensation: Secondary | ICD-10-CM

## 2016-08-20 DIAGNOSIS — M5440 Lumbago with sciatica, unspecified side: Secondary | ICD-10-CM | POA: Diagnosis present

## 2016-08-20 DIAGNOSIS — M25652 Stiffness of left hip, not elsewhere classified: Secondary | ICD-10-CM | POA: Diagnosis present

## 2016-08-20 NOTE — Patient Instructions (Addendum)
    Stretching: Hamstring (Supine)    Supporting right thigh behind knee, slowly straighten knee until stretch is felt in back of thigh. Hold __30__ seconds. Repeat __3__ times per set. Do __1__ sets per session. Do __2__ sessions per day.  http://orth.exer.us/657   Copyright  VHI. All rights reserved.  Piriformis Stretch    Lying on back, pull right knee toward opposite shoulder. Hold ___30_ seconds. Repeat __10__ times. Do __2__ sessions per day.  http://[email or web address].exer.us/258   Copyright  VHI. All rights reserved.     Bridge    Lie back, legs bent. Inhale, pressing hips up. Keeping ribs in, lengthen lower back. Exhale, rolling down along spine from top. Repeat __10__ times. Do __2__ sessions per day.  http://pm.exer.us/55   Copyright  VHI. All rights reserved.    Achilles / Gastroc, Standing    Stand, right foot behind, heel on floor and turned slightly out, leg straight, forward leg bent. Move hips forward. Hold __30_ seconds. Repeat __3_ times per session. Do __2_ sessions per day.  Copyright  VHI. All rights reserved.
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
## 2016-08-23 ENCOUNTER — Emergency Department (HOSPITAL_BASED_OUTPATIENT_CLINIC_OR_DEPARTMENT_OTHER)
Admission: EM | Admit: 2016-08-23 | Discharge: 2016-08-23 | Disposition: A | Discharge status: Home / Self Care | Payer: Federal, State, Local not specified - PPO | Attending: Emergency Medicine | Admitting: Emergency Medicine

## 2016-08-23 ENCOUNTER — Encounter (HOSPITAL_BASED_OUTPATIENT_CLINIC_OR_DEPARTMENT_OTHER): Payer: Self-pay | Admitting: Emergency Medicine

## 2016-08-23 DIAGNOSIS — N6489 Other specified disorders of breast: Secondary | ICD-10-CM

## 2016-08-23 DIAGNOSIS — N649 Disorder of breast, unspecified: Secondary | ICD-10-CM | POA: Diagnosis present

## 2016-08-23 DIAGNOSIS — N6452 Nipple discharge: Secondary | ICD-10-CM | POA: Insufficient documentation

## 2016-08-23 DIAGNOSIS — Z7982 Long term (current) use of aspirin: Secondary | ICD-10-CM | POA: Insufficient documentation

## 2016-08-23 DIAGNOSIS — I10 Essential (primary) hypertension: Secondary | ICD-10-CM | POA: Insufficient documentation

## 2016-08-23 NOTE — ED Provider Notes (Signed)
[place] DEPT [facility] Provider Note   CSN: [id number] Arrival date & time: 08/23/16  2131   By signing my name below, I, Margaret [name] and Margaret [name], attest that this documentation has been prepared under the direction and in the presence of Margaret Etienne, DO. Electronically Signed: Macon [name] and Margaret [name], ED Scribe. 08/23/16. 10:15 PM.   History   Chief Complaint Chief Complaint  Patient presents with  . Breast Problem   The history is provided by the patient. No language interpreter was used.  Rash   This is a new problem. The current episode started 12 to 24 hours ago. The problem has been gradually worsening. The problem is associated with an unknown factor. There has been no fever. Affected Location: left nipple. The patient is experiencing no pain. Associated symptoms comments: Itching, swelling. She has tried nothing for the symptoms. The treatment provided no relief.   HPI Comments: Margaret [name] is a 65 y.o. female who presents to the Emergency Department complaining of an area of moderate, gradually worsening itching and swelling on the left nipple onset today. Pt states she believes the area was rubbed by her bra. Pt notes having mammogram ~two months ago. She notes her PCP is at [facility] clinic. She denies fever, burning pain and nipple drainage.   Past Medical History:  Diagnosis Date  . Allergy   . Anemia   . Dyspareunia   . Endometriosis   . High cholesterol   . Hx of abnormal Pap smear 1980's  . Hypertension   . Osteopenia   . Ulcer ([place])   . Urinary incontinence     Patient Active Problem List   Diagnosis Date Noted  . Back pain 04/12/2014  . Arthritis 04/12/2014  . Knee pain 04/12/2014  . Unspecified essential hypertension 11/27/2013  . Osteoarthritis of left knee 07/16/2012    Past Surgical History:  Procedure Laterality Date  . ABDOMINAL HYSTERECTOMY  09/2008   TVH/BSO--Dr. Quincy Simmonds with TVT  . BLADDER SUSPENSION  09/2008   Dr.  Quincy Simmonds  . CERVIX LESION DESTRUCTION  1980   hx abnormal pap--dysplasia    OB History    Gravida Para Term Preterm AB Living   3 3 3     3    SAB TAB Ectopic Multiple Live Births                   Home Medications    Prior to Admission medications   Medication Sig Start Date End Date Taking? Authorizing Provider  aspirin 81 MG tablet Take 162 mg by mouth daily.    Historical Provider, MD  atorvastatin (LIPITOR) 10 MG tablet Take 10 mg by mouth daily. 05/05/15   Historical Provider, MD  cetirizine (ZYRTEC) 10 MG tablet Take 10 mg by mouth daily as needed for allergies.     Historical Provider, MD  Cholecalciferol (VITAMIN D) 2000 UNITS CAPS Take 2,000 Units by mouth daily.     Historical Provider, MD  fluticasone (FLONASE) 50 MCG/ACT nasal spray Place 1 spray into both nostrils daily as needed for allergies or rhinitis.     Historical Provider, MD  HYDROcodone-acetaminophen (NORCO/VICODIN) 5-325 MG per tablet Take 1-2 tablets by mouth every 6 (six) hours as needed for severe pain. 08/03/15   [name], MD  ibuprofen (ADVIL,MOTRIN) 200 MG tablet Take 400 mg by mouth every 6 (six) hours as needed for headache, mild pain or moderate pain.    Historical Provider, MD  lisinopril-hydrochlorothiazide (PRINZIDE,ZESTORETIC) 10-12.5 MG per tablet  Take 1 tablet by mouth daily. 05/05/15   Historical Provider, MD  LORazepam (ATIVAN) 0.5 MG tablet Take 0.5 mg by mouth 2 (two) times daily as needed. 08/15/15   Historical Provider, MD  meloxicam (MOBIC) 7.5 MG tablet Take 7.5 mg by mouth daily. 08/15/15   Historical Provider, MD  methocarbamol (ROBAXIN) 500 MG tablet Take 1 tablet (500 mg total) by mouth 2 (two) times daily. 08/03/15   [name], MD  Multiple Vitamins-Minerals (MULTIVITAMIN ADULT PO) Take 1 tablet by mouth daily.    Historical Provider, MD  ondansetron (ZOFRAN) 4 MG tablet Take 1 tablet (4 mg total) by mouth every 8 (eight) hours as needed for nausea or vomiting. 08/22/15   [name],  MD  pantoprazole (PROTONIX) 20 MG tablet Take 1 tablet (20 mg total) by mouth daily. 04/06/16   [name], PA-C    Family History Family History  Problem Relation Age of Onset  . Diabetes Mother   . Hypertension Mother   . Thyroid disease Sister   . Seizures Brother   . Diabetes Brother   . Hypertension Brother   . Diabetes Brother   . Arthritis    . Diabetes      Social History Social History  Substance Use Topics  . Smoking status: Never Smoker  . Smokeless tobacco: Never Used  . Alcohol use Yes     Comment: occasional     Allergies   Ciprofloxacin and Sulfa antibiotics   Review of Systems Review of Systems  Constitutional: Negative for chills and fever.  HENT: Negative for congestion and rhinorrhea.   Eyes: Negative for redness and visual disturbance.  Respiratory: Negative for shortness of breath and wheezing.   Cardiovascular: Negative for chest pain and palpitations.  Gastrointestinal: Negative for nausea and vomiting.  Genitourinary: Negative for dysuria and urgency.  Musculoskeletal: Negative for arthralgias and myalgias.       - nipple discharge - nipple burning sensation   Skin: Positive for rash (itching and swelling to left nipple). Negative for pallor and wound.  Neurological: Negative for dizziness and headaches.     Physical Exam Updated Vital Signs BP 124/79   Pulse 86   Temp 97.7 F (36.5 C) (Oral)   Resp 18   Ht 5' 5.5" (1.664 m)   Wt 163 lb (73.9 kg)   SpO2 98%   BMI 26.71 kg/m   Physical Exam  Constitutional: She is oriented to person, place, and time. She appears well-developed and well-nourished. No distress.  HENT:  Head: Normocephalic and atraumatic.  Eyes: EOM are normal. Pupils are equal, round, and reactive to light.  Neck: Normal range of motion. Neck supple.  Cardiovascular: Normal rate and regular rhythm.  Exam reveals no gallop and no friction rub.   No murmur heard. Pulmonary/Chest: Effort normal. She has no  wheezes. She has no rales.  Abdominal: Soft. She exhibits no distension. There is no tenderness.  Musculoskeletal: She exhibits no edema or tenderness.  Scab crusted over lesion scatter across left nipple. Dry skin just superior to the area. No mass or drainage. No fluctuance.   Neurological: She is alert and oriented to person, place, and time.  Skin: Skin is warm and dry. She is not diaphoretic.  Psychiatric: She has a normal mood and affect. Her behavior is normal.  Nursing note and vitals reviewed.    ED Treatments / Results   DIAGNOSTIC STUDIES: Oxygen Saturation is 98% on RA, normal by my interpretation.    COORDINATION  OF CARE: 10:02 PM Discussed treatment plan with pt at bedside which includes f/u with family doctor and pt agreed to plan.  Labs (all labs ordered are listed, but only abnormal results are displayed) Labs Reviewed - No data to display  EKG  EKG Interpretation None       Radiology No results found.  Procedures Procedures (including critical care time)  Medications Ordered in ED Medications - No data to display   Initial Impression / Assessment and Plan / ED Course  I have reviewed the triage vital signs and the nursing notes.  Pertinent labs & imaging results that were available during my care of the patient were reviewed by me and considered in my medical decision making (see chart for details).  Clinical Course    65 yo F with left nipple itching.  Feels that it has swollen over the past couple days.  On exam looks like rubbed skin that is now healing.  No noted other signs of swelling, induration.  No fluctuance or erythema.   10:42 PM:  I have discussed the diagnosis/risks/treatment options with the patient and family and believe the pt to be eligible for discharge home to follow-up with PCP. We also discussed returning to the ED immediately if new or worsening sx occur. We discussed the sx which are most concerning (e.g., sudden worsening  pain, fever, inability to tolerate by mouth) that necessitate immediate return. Medications administered to the patient during their visit and any new prescriptions provided to the patient are listed below.  Medications given during this visit Medications - No data to display   The patient appears reasonably screen and/or stabilized for discharge and I doubt any other medical condition or other [facility] requiring further screening, evaluation, or treatment in the ED at this time prior to discharge.    Final Clinical Impressions(s) / ED Diagnoses   Final diagnoses:  Nipple crusting    New Prescriptions Discharge Medication List as of 08/23/2016 10:14 PM      I personally performed the services described in this documentation, which was scribed in my presence. The recorded information has been reviewed and is accurate.      Margaret Etienne, DO 08/23/16 2242

## 2016-08-23 NOTE — ED Triage Notes (Signed)
Pt has bump on nipple of left breast. Denies any drainage.

## 2016-08-23 NOTE — Discharge Instructions (Signed)
Cover the area with neosporin, or vasoline.

## 2016-08-27 ENCOUNTER — Other Ambulatory Visit: Payer: Self-pay | Admitting: Gastroenterology

## 2016-08-27 ENCOUNTER — Ambulatory Visit: Payer: Federal, State, Local not specified - PPO | Admitting: Physical Therapy

## 2016-08-27 DIAGNOSIS — G8929 Other chronic pain: Secondary | ICD-10-CM

## 2016-08-27 DIAGNOSIS — M6281 Muscle weakness (generalized): Secondary | ICD-10-CM

## 2016-08-27 DIAGNOSIS — R1012 Left upper quadrant pain: Secondary | ICD-10-CM

## 2016-08-27 DIAGNOSIS — M5440 Lumbago with sciatica, unspecified side: Secondary | ICD-10-CM | POA: Diagnosis not present

## 2016-08-27 DIAGNOSIS — M25652 Stiffness of left hip, not elsewhere classified: Secondary | ICD-10-CM

## 2016-08-27 DIAGNOSIS — R208 Other disturbances of skin sensation: Secondary | ICD-10-CM

## 2016-08-27 NOTE — Therapy (Signed)
[place], [place], [address] Phone: [phone]   Fax:  [phone]  Physical Therapy Treatment  Patient Details  Name: Margaret [name] MRN: [id number] Date of Birth: Jan 08, 1951 Referring Provider: Dr. Arther Abbott  Encounter Date: 08/27/2016      PT End of Session - 08/27/16 0824    Visit Number 2   Number of Visits 6   Date for PT Re-Evaluation 10/02/16   PT Start Time 0815  late    PT Stop Time 0900   PT Time Calculation (min) 45 min   Activity Tolerance Patient tolerated treatment well   Behavior During Therapy [facility] for tasks assessed/performed      Past Medical History:  Diagnosis Date  . Allergy   . Anemia   . Dyspareunia   . Endometriosis   . High cholesterol   . Hx of abnormal Pap smear 1980's  . Hypertension   . Osteopenia   . Ulcer ([place])   . Urinary incontinence     Past Surgical History:  Procedure Laterality Date  . ABDOMINAL HYSTERECTOMY  09/2008   TVH/BSO--Dr. Quincy Simmonds with TVT  . BLADDER SUSPENSION  09/2008   Dr. Quincy Simmonds  . CERVIX LESION DESTRUCTION  1980   hx abnormal pap--dysplasia    There were no vitals filed for this visit.      Subjective Assessment - 08/27/16 0816    Subjective Back is better, 3/10 today.  Donnald Garre been working my legs so its been more numb.  Have been trying her HEP.     Currently in Pain? Yes   Pain Score 3    Pain Location Back   Pain Orientation Lower;Left   Pain Descriptors / Indicators Radiating   Pain Type Chronic pain   Pain Radiating Towards to Rt. side of low back   Pain Onset More than a month ago   Pain Frequency Intermittent   Aggravating Factors  unsure   Pain Relieving Factors exercising, rest, heat and Epsom                          [facility] Adult PT Treatment/Exercise - 08/27/16 0820      Lumbar Exercises: Supine   Bridge 10 reps   Bridge Limitations articulating      Knee/Hip Exercises: Stretches    Active Hamstring Stretch Both;2 reps;30 seconds   Piriformis Stretch 2 reps;30 seconds   Piriformis Stretch Limitations knee to opp shoulder    Gastroc Stretch 2 reps;30 seconds   Other Knee/Hip Stretches figure 4 x 2 , 30 sec      Moist Heat Therapy   Number Minutes Moist Heat 15 Minutes   Moist Heat Location Lumbar Spine;Knee  L calf                 PT Education - 08/27/16 [id number]    Education provided Yes   Education Details body mech, posture, and HEP correction    Person(s) Educated Patient   Methods Explanation;Demonstration;Handout   Comprehension Verbalized understanding;Returned demonstration;Need further instruction;Verbal cues required;Tactile cues required             PT Long Term Goals - 08/27/16 1143      PT LONG TERM GOAL #1   Title understand proper posture and body mechanics and demo during spontaneous activity in clinic.    Status On-going     PT LONG TERM GOAL #2   Title Pt will be I with HEP  for core, flexibility.    Status On-going     PT LONG TERM GOAL #3   Title Pt will be able to demo normal AROM in lumbar spine without pain (stretch only)    Status On-going     PT LONG TERM GOAL #4   Title Pt will take standing, walking breaks every hour at work to reduce compressive forces in the spine.    Status On-going     PT LONG TERM GOAL #5   Title Pt will be able to walk for fitness 20 min at a time without increasing pain.    Status On-going               Plan - 08/27/16 [id number]    Clinical Impression Statement Pt complains of continued calf tightness, tingling with normal daily activities.  Having less pain in back overall.  Poor awareness of posture and does not consider stress through back with supine to sit, bending to floor for certain ADLs.  Needed minor cueing for HEP.    PT Next Visit Plan add core (prepilates), manual if needed, heat/IFC?   PT Home Exercise Plan hamstring , standing calf, piriformis (figure 4, knee to opp  shoulder ) and bridge   Consulted and Agree with Plan of Care Patient      Patient will benefit from skilled therapeutic intervention in order to improve the following deficits and impairments:  Decreased mobility, Decreased range of motion, Decreased strength, Increased muscle spasms, Pain, Increased fascial restricitons, Impaired flexibility, Improper body mechanics, Postural dysfunction, Impaired sensation, Decreased balance  Visit Diagnosis: Chronic midline low back pain with sciatica, sciatica laterality unspecified  Other disturbances of skin sensation  Muscle weakness (generalized)  Stiffness of left hip, not elsewhere classified     Problem List Patient Active Problem List   Diagnosis Date Noted  . Back pain 04/12/2014  . Arthritis 04/12/2014  . Knee pain 04/12/2014  . Unspecified essential hypertension 11/27/2013  . Osteoarthritis of left knee 07/16/2012    Thedford Bunton 08/27/2016, 11:45 AM  [place], [place], [address] Phone: [phone]   Fax:  [phone]  Name: Margaret [name] MRN: [id number] Date of Birth: 24-Jul-1951   Raeford Razor, PT 08/27/16 11:45 AM Phone: [phone] Fax: [phone]

## 2016-08-27 NOTE — Patient Instructions (Addendum)
Reducing Load   Copyright  VHI. All rights reserved.  BODY MECHANICS Tips Good body mechanics are important during activities of daily living. The practice of good body mechanics will: -help distribute weight throughout the skeleton in a more anatomically correct manner thus stimulating more normal forces on the bones, and encouraging stronger, healthier, denser bones. -reduce unnatural forces on bones, ligaments, joints and muscles and reduce risk of fracture, other injury or back pain. A WORD ON BODY POSITIONING: Sitting is the hardest position for the back. Lying on the back is the easiest. Standing, in good body alignment, is somewhere between. A good motto is: Sit less, stand more, and, when you can't do that, lie down on your back and exercise to strengthen it.  Copyright  VHI. All rights reserved.       Supine to Sit (Active)   Lie on back, left leg bent. Roll to other side. From side-lying, sit up on side of bed. Complete ___ sets of ___ repetitions. Perform ___ sessions per day.  Copyright  VHI. All rights reserved.    Housework - Reaching Down   If you are unable to bend your knees or squat, use a lazy Manuela Schwartz to keep items within easy reach. Store only light, unbreakable items on the lowest shelves, and use a reacher to pick them up.  Copyright  VHI. All rights reserved.  Low Shelf   Squat down, and bring item close to lift.   Copyright  VHI. All rights reserved.  Lifting Principles .Maintain proper posture and head alignment. .Slide object as close as possible before lifting. .Move obstacles out of the way. .Test before lifting; ask for help if too heavy. .Tighten stomach muscles without holding breath. .Use smooth movements; do not jerk. .Use legs to do the work, and pivot with feet. .Distribute the work load symmetrically and close to the center of trunk. .Push instead of pull whenever possible.  Copyright  VHI. All rights reserved.  Posture -  Standing   Good posture is important. Avoid slouching and forward head thrust. Maintain curve in low back and align ears over shoul- ders, hips over ankles.   Copyright  VHI. All rights reserved.   Posture - Sitting   Sit upright, head facing forward. Try using a roll to support lower back. Keep shoulders relaxed, and avoid rounded back. Keep hips level with knees. Avoid crossing legs for long periods.   Copyright  VHI. All rights reserved.  Ideal Posture Use with figures on 3 (2 of 2): 1.Head erect 2.Chin in 3.Chest and navel aligned 4.Spinal curves maintained 5.Knees relaxed 6.Shoulders and hips aligned 7.Feet slightly apart 8.Toes and arches active 9.Abdomen taut (breathe with diaphragm) 10.Arms at sides Ideal posture is: -pain free. -achieved with practice, mindful interest, and body awareness.  Copyright  VHI. All rights reserved.     Move heavy items one at a time, or move portions of the contents.   Posture Awareness     Stand and check posture: Jut chin, pull back to comfortable position. Tilt pelvis forward, back; be sure back is not swayed. Roll from heels to balls of feet, then distribute your weight evenly. Picture a line through spine pulling you erect. Focus on breathing. Good Posture = Better Breathing. Check ____ times per day.  http://gt2.exer.us/873   Copyright  VHI. All rights reserved.    Outer Hip Stretch: Figure Four (Wall)    Hips centered (do not jockey to side), adjust distance from wall and bend of supporting  leg for less deep stretch. Hold for _30 sec ___ breaths. Repeat __2-3__ times each leg.  Copyright  VHI. All rights reserved.    Stretch out strap

## 2016-09-02 ENCOUNTER — Ambulatory Visit
Admission: RE | Admit: 2016-09-02 | Discharge: 2016-09-02 | Disposition: A | Discharge status: Home / Self Care | Payer: Federal, State, Local not specified - PPO | Source: Ambulatory Visit | Attending: Gastroenterology | Admitting: Gastroenterology

## 2016-09-02 DIAGNOSIS — R1012 Left upper quadrant pain: Secondary | ICD-10-CM

## 2016-09-02 MED ORDER — IOPAMIDOL (ISOVUE-300) INJECTION 61%
100.0000 mL | Freq: Once | INTRAVENOUS | Status: AC | PRN
  Administered 2016-09-02: 100 mL via INTRAVENOUS

## 2016-09-06 ENCOUNTER — Ambulatory Visit: Payer: Federal, State, Local not specified - PPO | Admitting: Physical Therapy

## 2016-09-11 ENCOUNTER — Ambulatory Visit: Payer: Federal, State, Local not specified - PPO | Attending: Family Medicine | Admitting: Physical Therapy

## 2016-09-11 DIAGNOSIS — R208 Other disturbances of skin sensation: Secondary | ICD-10-CM | POA: Diagnosis present

## 2016-09-11 DIAGNOSIS — M6281 Muscle weakness (generalized): Secondary | ICD-10-CM | POA: Insufficient documentation

## 2016-09-11 DIAGNOSIS — G8929 Other chronic pain: Secondary | ICD-10-CM | POA: Diagnosis present

## 2016-09-11 DIAGNOSIS — M5440 Lumbago with sciatica, unspecified side: Secondary | ICD-10-CM | POA: Insufficient documentation

## 2016-09-11 DIAGNOSIS — M25652 Stiffness of left hip, not elsewhere classified: Secondary | ICD-10-CM | POA: Diagnosis present

## 2016-09-11 NOTE — Patient Instructions (Signed)

## 2016-09-11 NOTE — Therapy (Signed)
[place], [place], [address] Phone: [phone]   Fax:  [phone]  Physical Therapy Treatment  Patient Details  Name: Margaret [name] MRN: [id number] Date of Birth: 1951/03/03 Referring Provider: Dr. Arther Abbott  Encounter Date: 09/11/2016      PT End of Session - 09/11/16 0855    Visit Number 3   Number of Visits 6   Date for PT Re-Evaluation 10/02/16   PT Start Time 0845   PT Stop Time 0932   PT Time Calculation (min) 47 min   Activity Tolerance Patient tolerated treatment well   Behavior During Therapy [facility] for tasks assessed/performed      Past Medical History:  Diagnosis Date  . Allergy   . Anemia   . Dyspareunia   . Endometriosis   . High cholesterol   . Hx of abnormal Pap smear 1980's  . Hypertension   . Osteopenia   . Ulcer ([place])   . Urinary incontinence     Past Surgical History:  Procedure Laterality Date  . ABDOMINAL HYSTERECTOMY  09/2008   TVH/BSO--Dr. Quincy Simmonds with TVT  . BLADDER SUSPENSION  09/2008   Dr. Quincy Simmonds  . CERVIX LESION DESTRUCTION  1980   hx abnormal pap--dysplasia    There were no vitals filed for this visit.      Subjective Assessment - 09/11/16 0849    Subjective Its my hip that is bothering me.  Saw the chiropractor Thursday.  Missed Friday.     Currently in Pain? Yes   Pain Score 6    Pain Location Back   Pain Orientation Lower;Left   Pain Type Chronic pain   Pain Radiating Towards "hip"   Pain Onset More than a month ago   Pain Frequency Intermittent   Aggravating Factors  sitting alot   Pain Relieving Factors exercises, rest, heat, getting an ergonomic chair           [facility] Adult PT Treatment/Exercise - 09/11/16 0854      Lumbar Exercises: Aerobic   Stationary Bike NuStep L5 UE and LE for 8 min      Lumbar Exercises: Supine   Ab Set 10 reps   Clam 10 reps   Clam Limitations done unilateral and bilateral   Heel Slides 10 reps    Bent Knee Raise 10 reps   Bridge 10 reps   Bridge Limitations articulating      Knee/Hip Exercises: Stretches   Active Hamstring Stretch Both;2 reps;30 seconds   Piriformis Stretch 2 reps;30 seconds   Piriformis Stretch Limitations seated figure 4                 PT Education - 09/11/16 1233    Education provided Yes   Education Details stabilization, core, need to do HEP consistent   Person(s) Educated Patient   Methods Explanation;Demonstration;Tactile cues;Verbal cues;Handout   Comprehension Verbalized understanding;Returned demonstration             PT Long Term Goals - 09/11/16 0919      PT LONG TERM GOAL #1   Title understand proper posture and body mechanics and demo during spontaneous activity in clinic.    Status On-going     PT LONG TERM GOAL #2   Title Pt will be I with HEP for core, flexibility.    Status On-going     PT LONG TERM GOAL #3   Title Pt will be able to demo normal AROM in lumbar spine without pain (  stretch only)    Status On-going     PT LONG TERM GOAL #4   Title Pt will take standing, walking breaks every hour at work to reduce compressive forces in the spine.    Status On-going     PT LONG TERM GOAL #5   Title Pt will be able to walk for fitness 20 min at a time without increasing pain.    Status On-going               Plan - 09/11/16 [id number]    Clinical Impression Statement Pt has not been completely compliant with HEP.  Has been very busy at work.  Tries to take more breaks.  Cont to have mid to mod pain due with extended periods of sitting, shouldl improve with stand up desk.    PT Next Visit Plan check HEP (prepilates) and advance with props,    PT Home Exercise Plan hamstring , standing calf, piriformis (figure 4, knee to opp shoulder ) and bridge, prepilates   Consulted and Agree with Plan of Care Patient      Patient will benefit from skilled therapeutic intervention in order to improve the following deficits  and impairments:  Decreased mobility, Decreased range of motion, Decreased strength, Increased muscle spasms, Pain, Increased fascial restricitons, Impaired flexibility, Improper body mechanics, Postural dysfunction, Impaired sensation, Decreased balance  Visit Diagnosis: Chronic midline low back pain with sciatica, sciatica laterality unspecified  Other disturbances of skin sensation  Muscle weakness (generalized)  Stiffness of left hip, not elsewhere classified     Problem List Patient Active Problem List   Diagnosis Date Noted  . Back pain 04/12/2014  . Arthritis 04/12/2014  . Knee pain 04/12/2014  . Unspecified essential hypertension 11/27/2013  . Osteoarthritis of left knee 07/16/2012    Daveyon Kitchings 09/11/2016, 12:38 PM  [facility] [address] [place], [place], [address] Phone: [phone]   Fax:  [phone]  Name: Margaret [name] MRN: [id number] Date of Birth: July 28, 1951  Raeford Razor, PT 09/11/16 12:39 PM Phone: [phone] Fax: [phone]

## 2016-09-19 ENCOUNTER — Ambulatory Visit: Payer: Federal, State, Local not specified - PPO | Admitting: Physical Therapy

## 2016-09-19 DIAGNOSIS — M6281 Muscle weakness (generalized): Secondary | ICD-10-CM

## 2016-09-19 DIAGNOSIS — R208 Other disturbances of skin sensation: Secondary | ICD-10-CM

## 2016-09-19 DIAGNOSIS — M5440 Lumbago with sciatica, unspecified side: Principal | ICD-10-CM

## 2016-09-19 DIAGNOSIS — M25652 Stiffness of left hip, not elsewhere classified: Secondary | ICD-10-CM

## 2016-09-19 DIAGNOSIS — G8929 Other chronic pain: Secondary | ICD-10-CM

## 2016-09-19 NOTE — Therapy (Signed)
[place], [place], [address] Phone: [phone]   Fax:  [phone]  Physical Therapy Treatment  Patient Details  Name: Margaret [name] MRN: [id number] Date of Birth: September 08, 1951 Referring Provider: Dr. Arther Abbott  Encounter Date: 09/19/2016      PT End of Session - 09/19/16 0813    Visit Number 4   Number of Visits 6   Date for PT Re-Evaluation 10/02/16   PT Start Time 0802   PT Stop Time [id number]   PT Time Calculation (min) 53 min   Activity Tolerance Patient tolerated treatment well   Behavior During Therapy [facility] for tasks assessed/performed      Past Medical History:  Diagnosis Date  . Allergy   . Anemia   . Dyspareunia   . Endometriosis   . High cholesterol   . Hx of abnormal Pap smear 1980's  . Hypertension   . Osteopenia   . Ulcer ([place])   . Urinary incontinence     Past Surgical History:  Procedure Laterality Date  . ABDOMINAL HYSTERECTOMY  09/2008   TVH/BSO--Dr. Quincy Simmonds with TVT  . BLADDER SUSPENSION  09/2008   Dr. Quincy Simmonds  . CERVIX LESION DESTRUCTION  1980   hx abnormal pap--dysplasia    There were no vitals filed for this visit.      Subjective Assessment - 09/19/16 0805    Subjective I have been more consistent with my HEP.  Still have 4/10 low back pain.  PT had a CT scan on her abdomen because of her back pain.     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 Yes   Pain Score 5    Pain Location Back   Pain Orientation Lower   Pain Type Chronic pain   Pain Onset More than a month ago   Pain Frequency Intermittent                         [facility] Adult PT Treatment/Exercise - 09/19/16 0813      Self-Care   Self-Care Heat/Ice Application;Other Self-Care Comments   Other Self-Care Comments  QL and function     Lumbar Exercises: Stretches   Standing Side Bend 2 reps;30 seconds   Standing Side Bend  Limitations seated on mat    Active Hamstring Stretch Both;2 reps;30 seconds   Piriformis Stretch 2 reps;30 seconds   Piriformis Stretch Limitations seated figure 4      Lumbar Exercises: Supine   Bent Knee Raise 10 reps   Bent Knee Raise Limitations block under sacrum    Straight Leg Raise 10 reps   Straight Leg Raises Limitations added UE reach (2 sets )    Other Supine Lumbar Exercises alternating LE (scissors)     Moist Heat Therapy   Number Minutes Moist Heat 10 Minutes   Moist Heat Location Lumbar Spine;Knee  L calf      Manual Therapy   Manual therapy comments very painful along entire L side especially at rib attachment of QL, worked also on glute med and L paraspinals    Soft tissue mobilization L QL in sidelying                      PT Long Term Goals - 09/11/16 0919      PT LONG TERM GOAL #1   Title understand proper posture and body mechanics  and demo during spontaneous activity in clinic.    Status On-going     PT LONG TERM GOAL #2   Title Pt will be I with HEP for core, flexibility.    Status On-going     PT LONG TERM GOAL #3   Title Pt will be able to demo normal AROM in lumbar spine without pain (stretch only)    Status On-going     PT LONG TERM GOAL #4   Title Pt will take standing, walking breaks every hour at work to reduce compressive forces in the spine.    Status On-going     PT LONG TERM GOAL #5   Title Pt will be able to walk for fitness 20 min at a time without increasing pain.    Status On-going             Patient will benefit from skilled therapeutic intervention in order to improve the following deficits and impairments:     Visit Diagnosis: Chronic midline low back pain with sciatica, sciatica laterality unspecified  Other disturbances of skin sensation  Muscle weakness (generalized)  Stiffness of left hip, not elsewhere classified     Problem List Patient Active Problem List   Diagnosis Date Noted  .  Back pain 04/12/2014  . Arthritis 04/12/2014  . Knee pain 04/12/2014  . Unspecified essential hypertension 11/27/2013  . Osteoarthritis of left knee 07/16/2012    PAA,JENNIFER 09/19/2016, 8:52 AM  [facility] [address] [place], [place], [address] Phone: [phone]   Fax:  [phone]  Name: Margaret [name] MRN: [id number] Date of Birth: 1951-03-12  Raeford Razor, PT 09/19/16 10:29 AM Phone: [phone] Fax: [phone]

## 2016-09-26 ENCOUNTER — Ambulatory Visit: Payer: Federal, State, Local not specified - PPO | Admitting: Physical Therapy

## 2016-09-26 DIAGNOSIS — R208 Other disturbances of skin sensation: Secondary | ICD-10-CM

## 2016-09-26 DIAGNOSIS — M6281 Muscle weakness (generalized): Secondary | ICD-10-CM

## 2016-09-26 DIAGNOSIS — M5440 Lumbago with sciatica, unspecified side: Secondary | ICD-10-CM | POA: Diagnosis not present

## 2016-09-26 DIAGNOSIS — G8929 Other chronic pain: Secondary | ICD-10-CM

## 2016-09-26 DIAGNOSIS — M25652 Stiffness of left hip, not elsewhere classified: Secondary | ICD-10-CM

## 2016-09-26 NOTE — Therapy (Signed)
[place], [place], [address] Phone: [phone]   Fax:  [phone]  Physical Therapy Treatment/Renewal  Patient Details  Name: Margaret [name] MRN: [id number] Date of Birth: 28-Aug-1951 Referring Provider: Dr. Arther Abbott  Encounter Date: 09/26/2016      PT End of Session - 09/26/16 0810    Visit Number 5   Number of Visits 10   Date for PT Re-Evaluation 10/31/16   PT Start Time 0800   PT Stop Time 0855   PT Time Calculation (min) 55 min   Activity Tolerance Patient tolerated treatment well   Behavior During Therapy [facility] for tasks assessed/performed      Past Medical History:  Diagnosis Date  . Allergy   . Anemia   . Dyspareunia   . Endometriosis   . High cholesterol   . Hx of abnormal Pap smear 1980's  . Hypertension   . Osteopenia   . Ulcer ([place])   . Urinary incontinence     Past Surgical History:  Procedure Laterality Date  . ABDOMINAL HYSTERECTOMY  09/2008   TVH/BSO--Dr. Quincy Simmonds with TVT  . BLADDER SUSPENSION  09/2008   Dr. Quincy Simmonds  . CERVIX LESION DESTRUCTION  1980   hx abnormal pap--dysplasia    There were no vitals filed for this visit.      Subjective Assessment - 09/26/16 0804    Subjective The last treatment really opened something up.  Had increased pain which is still bothering me.  Sees Primary Monday.  She has a little more energy since beginning, my low back is not hurting as much.    Currently in Pain? Yes   Pain Score 2    Pain Location Back   Pain Orientation Left;Lower   Pain Descriptors / Indicators Sore   Pain Type Chronic pain   Pain Onset More than a month ago   Pain Frequency Intermittent   Aggravating Factors  sitting alot   Pain Relieving Factors gentle stretching may help, does not take pain meds I do not like meds   Effect of Pain on Daily Activities I deal with alot of pain             [facility] PT Assessment - 09/26/16 0830      AROM    Lumbar Flexion WFL tight    Lumbar Extension 10% discomfort but no pain    Lumbar - Right Side Bend WFL tighter L side    Lumbar - Left Side Bend WFL   Lumbar - Right Rotation WFL   Lumbar - Left Rotation [facility]      Strength   Right Hip ABduction 4+/5   Left Hip ABduction 4/5                     [facility] Adult PT Treatment/Exercise - 09/26/16 0812      Self-Care   Other Self-Care Comments  QL and function, renewal, progress, symptoms     Lumbar Exercises: Stretches   Single Knee to Chest Stretch 2 reps;30 seconds   Piriformis Stretch 1 rep;30 seconds   Piriformis Stretch Limitations too painful      Lumbar Exercises: Aerobic   Stationary Bike L3 for 5 min warm up      Lumbar Exercises: Sidelying   Hip Abduction 10 reps   Hip Abduction Weights (lbs) 2 sets    Other Sidelying Lumbar Exercises QL stretch for L quadratus lumborum min overpressure      Lumbar Exercises:  Quadruped   Madcat/Old Horse 5 reps   Madcat/Old Horse Limitations wags for lateral flexion    Other Quadruped Lumbar Exercises posterior rocking in quadruped x 5    Other Quadruped Lumbar Exercises childs pose FW and lateral                PT Education - 09/26/16 0823    Education provided Yes   Education Details pain vs strain , HEP reinforcement    Person(s) Educated Patient   Methods Explanation;Demonstration;Verbal cues   Comprehension Returned demonstration;Verbalized understanding             PT Long Term Goals - 09/26/16 0811      PT LONG TERM GOAL #1   Title understand proper posture and body mechanics and demo during spontaneous activity in clinic.    Baseline cont to reinforce   Status On-going     PT LONG TERM GOAL #2   Title Pt will be I with HEP for core, flexibility.    Status On-going     PT LONG TERM GOAL #3   Title Pt will be able to demo normal AROM in lumbar spine without pain (stretch only)    Status Achieved     PT LONG TERM GOAL #4   Title Pt will  take standing, walking breaks every hour at work to reduce compressive forces in the spine.    Baseline takes more than prior, but not every hour    Status On-going     PT LONG TERM GOAL #5   Title Pt will be able to walk for fitness 20 min at a time without increasing pain.    Baseline has not done but she "thinks she can"    Status Partially Met               Plan - 09/26/16 0839    Clinical Impression Statement Patient has less sensory symptoms and weakness.  She cont to have pain which she does not fully understand.  Pain is lateral hip L and L side of trunk, lumbar.  She has significant tightness in lumbar paraspinals and QL . She will benefit from 4 more sessions to provide reinforcement of principles and guided exercise.    Rehab Potential Good   PT Frequency 1x / week   PT Duration 4 weeks   PT Treatment/Interventions Electrical Stimulation;Moist Heat;Therapeutic exercise;Manual techniques;Patient/family education;Neuromuscular re-education;Cryotherapy;Therapeutic activities;Dry needling;Ultrasound   PT Next Visit Plan try [place] to QL, cont stretching and core    PT Home Exercise Plan hamstring , standing calf, piriformis (figure 4, knee to opp shoulder ) and bridge, prepilates   Consulted and Agree with Plan of Care Patient      Patient will benefit from skilled therapeutic intervention in order to improve the following deficits and impairments:  Decreased mobility, Decreased range of motion, Decreased strength, Increased muscle spasms, Pain, Increased fascial restricitons, Impaired flexibility, Improper body mechanics, Postural dysfunction, Impaired sensation, Decreased balance  Visit Diagnosis: Chronic midline low back pain with sciatica, sciatica laterality unspecified  Other disturbances of skin sensation  Muscle weakness (generalized)  Stiffness of left hip, not elsewhere classified     Problem List Patient Active Problem List   Diagnosis Date Noted  . Back  pain 04/12/2014  . Arthritis 04/12/2014  . Knee pain 04/12/2014  . Unspecified essential hypertension 11/27/2013  . Osteoarthritis of left knee 07/16/2012    Brazil Voytko 09/26/2016, 12:58 PM  [facility] [place]  [place], [place], [address] Phone: [phone]   Fax:  [phone]  Name: Levaeh Vice MRN: [id number] Date of Birth: 09/23/1951

## 2016-09-26 NOTE — Addendum Note (Signed)
Addended by: Raeford Razor L on: 09/26/2016 01:01 PM   Modules accepted: Orders

## 2016-09-26 NOTE — Patient Instructions (Signed)
BACK: Child's Pose (Sciatica)    Sit in knee-chest position and reach arms forward. Separate knees for comfort. Hold position for _3__ breaths. Repeat __3_ times. Do __2_ times per day.  Copyright  VHI. All rights reserved.    Angry Cat Stretch    Tuck chin and tighten stomach, arching back. Repeat ___5_ times per set. Do _1__ sets per session. Do __2__ sessions per day.  http://orth.exer.us/119   Copyright  VHI. All rights reserved.

## 2016-10-01 ENCOUNTER — Ambulatory Visit: Payer: Federal, State, Local not specified - PPO | Admitting: Physical Therapy

## 2016-10-01 DIAGNOSIS — M5440 Lumbago with sciatica, unspecified side: Secondary | ICD-10-CM | POA: Diagnosis not present

## 2016-10-01 DIAGNOSIS — M25652 Stiffness of left hip, not elsewhere classified: Secondary | ICD-10-CM

## 2016-10-01 DIAGNOSIS — M6281 Muscle weakness (generalized): Secondary | ICD-10-CM

## 2016-10-01 DIAGNOSIS — G8929 Other chronic pain: Secondary | ICD-10-CM

## 2016-10-01 DIAGNOSIS — R208 Other disturbances of skin sensation: Secondary | ICD-10-CM

## 2016-10-01 NOTE — Therapy (Signed)
[place], [place], [address] Phone: [phone]   Fax:  [phone]  Physical Therapy Treatment  Patient Details  Name: Margaret [name] MRN: [id number] Date of Birth: [birth year] Referring Provider: Dr. Arther Abbott  Encounter Date: 10/01/2016      PT End of Session - 10/01/16 0855    Visit Number 6   Number of Visits 10   Date for PT Re-Evaluation 10/31/16   PT Start Time 0848   PT Stop Time 0934   PT Time Calculation (min) 46 min   Activity Tolerance Patient tolerated treatment well   Behavior During Therapy [facility] for tasks assessed/performed      Past Medical History:  Diagnosis Date  . Allergy   . Anemia   . Dyspareunia   . Endometriosis   . High cholesterol   . Hx of abnormal Pap smear 1980's  . Hypertension   . Osteopenia   . Ulcer ([place])   . Urinary incontinence     Past Surgical History:  Procedure Laterality Date  . ABDOMINAL HYSTERECTOMY  09/2008   TVH/BSO--Dr. Quincy Simmonds with TVT  . BLADDER SUSPENSION  09/2008   Dr. Quincy Simmonds  . CERVIX LESION DESTRUCTION  1980   hx abnormal pap--dysplasia    There were no vitals filed for this visit.      Subjective Assessment - 10/01/16 0850    Subjective "It is what it is" Low back and my whole body hurts.  Had a flu shot. Seems like I have more days of stiffness and hurting. I think I'm going to join a yoga class.    Currently in Pain? Yes   Pain Score 3    Pain Location Back   Pain Orientation Lower   Pain Descriptors / Indicators Tightness   Pain Type Chronic pain   Pain Radiating Towards hip and leg    Pain Onset More than a month ago   Pain Frequency Intermittent            [facility] Adult PT Treatment/Exercise - 10/01/16 0900      Lumbar Exercises: Aerobic   Stationary Bike L4 and UE and LE for 5 min      Lumbar Exercises: Quadruped   Madcat/Old Horse 10 reps   Single Arm Raise 10 reps   Straight Leg Raise 10 reps   Opposite Arm/Leg Raise 10 reps   Other Quadruped Lumbar Exercises childs pose FW and lateral  30 sec each      Moist Heat Therapy   Number Minutes Moist Heat 7 Minutes   Moist Heat Location Lumbar Spine     Ultrasound   Ultrasound Location L QL and lumbar   Ultrasound Parameters 100%, 1.5 W/cm 2 and 1 MHz  8 min    Ultrasound Goals Pain                PT Education - 10/01/16 0933    Education provided Yes   Education Details yoga/gym post rehab ([employer])   Person(s) Educated Patient   Methods Explanation   Comprehension Verbalized understanding             PT Long Term Goals - 10/01/16 0938      PT LONG TERM GOAL #1   Title understand proper posture and body mechanics and demo during spontaneous activity in clinic.    Baseline cont to reinforce   Status On-going     PT LONG TERM GOAL #2   Title Pt will be  I with HEP for core, flexibility.    Status On-going     PT LONG TERM GOAL #3   Title Pt will be able to demo normal AROM in lumbar spine without pain (stretch only)    Status Achieved     PT LONG TERM GOAL #4   Title Pt will take standing, walking breaks every hour at work to reduce compressive forces in the spine.    Status Partially Met     PT LONG TERM GOAL #5   Title Pt will be able to walk for fitness 20 min at a time without increasing pain.    Status On-going               Plan - 10/01/16 0934    Clinical Impression Statement Pt without change in symptoms.  She has been stretching and using heat on her back.  [place] trial today.  Needs reinforcment of principles of alignment and exercise.    PT Next Visit Plan how was [place]? US/manual  to QL, cont stretching and core    PT Home Exercise Plan hamstring , standing calf, piriformis (figure 4, knee to opp shoulder ) and bridge, prepilates   Consulted and Agree with Plan of Care Patient      Patient will benefit from skilled therapeutic intervention in order to improve the following  deficits and impairments:  Decreased mobility, Decreased range of motion, Decreased strength, Increased muscle spasms, Pain, Increased fascial restricitons, Impaired flexibility, Improper body mechanics, Postural dysfunction, Impaired sensation, Decreased balance  Visit Diagnosis: Chronic midline low back pain with sciatica, sciatica laterality unspecified  Other disturbances of skin sensation  Muscle weakness (generalized)  Stiffness of left hip, not elsewhere classified     Problem List Patient Active Problem List   Diagnosis Date Noted  . Back pain 04/12/2014  . Arthritis 04/12/2014  . Knee pain 04/12/2014  . Unspecified essential hypertension 11/27/2013  . Osteoarthritis of left knee 07/16/2012    Margaret [name] 10/01/2016, 9:40 AM  [place], [place], [address] Phone: [phone]   Fax:  [phone]  Name: Margaret [name] MRN: [id number] Date of Birth: Nov 30, 1950  Raeford Razor, PT 10/01/16 9:41 AM Phone: [phone] Fax: [phone]

## 2016-10-07 ENCOUNTER — Ambulatory Visit: Payer: Federal, State, Local not specified - PPO | Admitting: Physical Therapy

## 2016-10-16 ENCOUNTER — Ambulatory Visit: Payer: Federal, State, Local not specified - PPO | Attending: Family Medicine | Admitting: Physical Therapy

## 2016-10-16 DIAGNOSIS — M6281 Muscle weakness (generalized): Secondary | ICD-10-CM | POA: Diagnosis present

## 2016-10-16 DIAGNOSIS — M5440 Lumbago with sciatica, unspecified side: Secondary | ICD-10-CM | POA: Diagnosis not present

## 2016-10-16 DIAGNOSIS — G8929 Other chronic pain: Secondary | ICD-10-CM | POA: Diagnosis present

## 2016-10-16 DIAGNOSIS — M25652 Stiffness of left hip, not elsewhere classified: Secondary | ICD-10-CM

## 2016-10-16 DIAGNOSIS — R208 Other disturbances of skin sensation: Secondary | ICD-10-CM | POA: Diagnosis present

## 2016-10-16 NOTE — Therapy (Signed)
[place], [place], [address] Phone: [phone]   Fax:  [phone]  Physical Therapy Treatment  Patient Details  Name: Margaret [name] MRN: [id number] Date of Birth: 28-Oct-1951 Referring Provider: Dr. Arther Abbott  Encounter Date: 10/16/2016      PT End of Session - 10/16/16 1215    Visit Number 7   Number of Visits 10   Date for PT Re-Evaluation 10/31/16   PT Start Time 0804   PT Stop Time 1601   PT Time Calculation (min) 53 min   Activity Tolerance Patient tolerated treatment well   Behavior During Therapy [facility] for tasks assessed/performed      Past Medical History:  Diagnosis Date  . Allergy   . Anemia   . Dyspareunia   . Endometriosis   . High cholesterol   . Hx of abnormal Pap smear 1980's  . Hypertension   . Osteopenia   . Ulcer ([place])   . Urinary incontinence     Past Surgical History:  Procedure Laterality Date  . ABDOMINAL HYSTERECTOMY  09/2008   TVH/BSO--Dr. Quincy Simmonds with TVT  . BLADDER SUSPENSION  09/2008   Dr. Quincy Simmonds  . CERVIX LESION DESTRUCTION  1980   hx abnormal pap--dysplasia    There were no vitals filed for this visit.      Subjective Assessment - 10/16/16 0806    Subjective i feel pretty good,  I have a new chair at work and a desk that rises,  Has not signed up for YOGA class.  Goes to gym intermittantly   Currently in Pain? No/denies   Pain Location Back   Pain Frequency Intermittent   Aggravating Factors  sitting a lot                         [facility] Adult PT Treatment/Exercise - 10/16/16 0001      Lumbar Exercises: Stretches   Passive Hamstring Stretch 3 reps;30 seconds   Standing Side Bend --  also sytretched in sidelying over a pillow   Standing Side Bend Limitations reviewed     Lumbar Exercises: Standing   Other Standing Lumbar Exercises gait cues for technique increased pain to 5/10 in Quadratus lumborum.     Lumbar  Exercises: Supine   Ab Set 5 reps   Clam 10 reps   Clam Limitations blue band   Isometric Hip Flexion 5 reps  in hooklying with arms pressing , too easy   Large Ball Abdominal Isometric Limitations 5, too easy   Other Supine Lumbar Exercises Legs '90/90 , knee press for abdominals, both and single arm presses 5 x 5 second holds, challanging   Other Supine Lumbar Exercises dead bug ip toes  5 x and single arm/leg march 5 x each with cues     Moist Heat Therapy   Number Minutes Moist Heat 10 Minutes   Moist Heat Location Lumbar Spine  quadratus lumborum, sidelying over pillow     Manual Therapy   Soft tissue mobilization soft tissue work, strumminh and stretching to Quadratus lumborum                      PT Long Term Goals - 10/16/16 1219      PT LONG TERM GOAL #1   Title understand proper posture and body mechanics and demo during spontaneous activity in clinic.    Baseline She cought herself getting off mat wrong and looked around  to see if her primary PT was a witness.   Time 6   Period Weeks   Status On-going     PT LONG TERM GOAL #2   Title Pt will be I with HEP for core, flexibility.    Time 6   Period Weeks   Status On-going     PT LONG TERM GOAL #3   Title Pt will be able to demo normal AROM in lumbar spine without pain (stretch only)    Time 6   Period Weeks   Status Achieved     PT LONG TERM GOAL #4   Title Pt will take standing, walking breaks every hour at work to reduce compressive forces in the spine.    Time 6   Period Weeks   Status Unable to assess     PT LONG TERM GOAL #5   Title Pt will be able to walk for fitness 20 min at a time without increasing pain.    Time 6   Period Weeks   Status On-going               Plan - 10/16/16 1215    Clinical Impression Statement Patient did not have pain initially.  At end of session cues for gait change increased her pain to 5/10.  (QL)  better with Soft tissue work, stretching and  heat.  No new goals met,  Changes at work helpful.  Patient had off 2 weeks for vacation prior to this session.    PT Next Visit Plan  US/manual  to QL, If needed.   cont stretching and core    PT Home Exercise Plan hamstring , standing calf, piriformis (figure 4, knee to opp shoulder ) and bridge, prepilates   Consulted and Agree with Plan of Care Patient      Patient will benefit from skilled therapeutic intervention in order to improve the following deficits and impairments:  Decreased mobility, Decreased range of motion, Decreased strength, Increased muscle spasms, Pain, Increased fascial restricitons, Impaired flexibility, Improper body mechanics, Postural dysfunction, Impaired sensation, Decreased balance  Visit Diagnosis: Chronic midline low back pain with sciatica, sciatica laterality unspecified  Other disturbances of skin sensation  Muscle weakness (generalized)  Stiffness of left hip, not elsewhere classified     Problem List Patient Active Problem List   Diagnosis Date Noted  . Back pain 04/12/2014  . Arthritis 04/12/2014  . Knee pain 04/12/2014  . Unspecified essential hypertension 11/27/2013  . Osteoarthritis of left knee 07/16/2012    HARRIS,KAREN PTA 10/16/2016, 12:23 PM  [facility] [address] [place], [place], [address] Phone: [phone]   Fax:  [phone]  Name: Margaret [name] MRN: [id number] Date of Birth: Sep 27, 1951

## 2016-10-23 ENCOUNTER — Ambulatory Visit: Payer: Federal, State, Local not specified - PPO | Admitting: Physical Therapy

## 2016-10-23 DIAGNOSIS — R208 Other disturbances of skin sensation: Secondary | ICD-10-CM

## 2016-10-23 DIAGNOSIS — M5440 Lumbago with sciatica, unspecified side: Principal | ICD-10-CM

## 2016-10-23 DIAGNOSIS — M6281 Muscle weakness (generalized): Secondary | ICD-10-CM

## 2016-10-23 DIAGNOSIS — M25652 Stiffness of left hip, not elsewhere classified: Secondary | ICD-10-CM

## 2016-10-23 DIAGNOSIS — G8929 Other chronic pain: Secondary | ICD-10-CM

## 2016-10-23 NOTE — Therapy (Signed)
[place], [place], [address] Phone: [phone]   Fax:  [phone]  Physical Therapy Treatment  Patient Details  Name: Margaret [name] MRN: [id number] Date of Birth: 24-Jun-1951 Referring Provider: Dr. Arther Abbott  Encounter Date: 10/23/2016      PT End of Session - 10/23/16 0912    Visit Number 8   Number of Visits 10   Date for PT Re-Evaluation 10/31/16   PT Start Time 0845   PT Stop Time 0935   PT Time Calculation (min) 50 min   Activity Tolerance Patient tolerated treatment well   Behavior During Therapy [facility] for tasks assessed/performed      Past Medical History:  Diagnosis Date  . Allergy   . Anemia   . Dyspareunia   . Endometriosis   . High cholesterol   . Hx of abnormal Pap smear 1980's  . Hypertension   . Osteopenia   . Ulcer ([place])   . Urinary incontinence     Past Surgical History:  Procedure Laterality Date  . ABDOMINAL HYSTERECTOMY  09/2008   TVH/BSO--Dr. Quincy Simmonds with TVT  . BLADDER SUSPENSION  09/2008   Dr. Quincy Simmonds  . CERVIX LESION DESTRUCTION  1980   hx abnormal pap--dysplasia    There were no vitals filed for this visit.      Subjective Assessment - 10/23/16 0850    Subjective Im doing better.  Little bit of discomfort in L side low back.     Currently in Pain? Yes   Pain Score 2    Pain Location Back   Pain Orientation Left;Lower   Pain Descriptors / Indicators Discomfort   Pain Type Chronic pain   Pain Onset More than a month ago   Pain Frequency Intermittent   Aggravating Factors  sitting too much   Pain Relieving Factors gentle stretching, heat and massage                          [facility] Adult PT Treatment/Exercise - 10/23/16 0855      Self-Care   Other Self-Care Comments  foam roller      Lumbar Exercises: Aerobic   Elliptical level 4 ramp, level 1 resistance for 5 min very tired, no pain incr      Lumbar Exercises: Supine    Ab Set 5 reps   Clam 10 reps   Bent Knee Raise 10 reps   Straight Leg Raise 10 reps   Other Supine Lumbar Exercises horiz abd x 10 on foam roller    Other Supine Lumbar Exercises all exercises done on soft foam roller for core challenge and counterpressure      Lumbar Exercises: Sidelying   Other Sidelying Lumbar Exercises QL stretch for L quadratus lumborum min overpressure , sidebending over bolster 30 sec x 3   prior to stabilization      Moist Heat Therapy   Number Minutes Moist Heat 10 Minutes   Moist Heat Location Lumbar Spine                     PT Long Term Goals - 10/23/16 0903      PT LONG TERM GOAL #1   Title understand proper posture and body mechanics and demo during spontaneous activity in clinic.    Status On-going     PT LONG TERM GOAL #2   Title Pt will be I with HEP for core, flexibility.  Status On-going     PT LONG TERM GOAL #3   Title Pt will be able to demo normal AROM in lumbar spine without pain (stretch only)    Status Achieved     PT LONG TERM GOAL #4   Title Pt will take standing, walking breaks every hour at work to reduce compressive forces in the spine.    Status Achieved     PT LONG TERM GOAL #5   Title Pt will be able to walk for fitness 20 min at a time without increasing pain.    Baseline has not done but she "thinks she can"    Status On-going               Plan - 10/23/16 0929    Clinical Impression Statement Patient seems to have a plan in place to join the [employer] and do exercise classes in addition to her HEP.  She admits lack of motiviation due to long hours at work (6 days a week).  She does more standing at work now, pain lessening with daily activities.    PT Next Visit Plan  US/manual  to QL, If needed.  DC to HEP next visit    PT Home Exercise Plan hamstring , standing calf, piriformis (figure 4, knee to opp shoulder ) and bridge, prepilates   Consulted and Agree with Plan of Care Patient      Patient  will benefit from skilled therapeutic intervention in order to improve the following deficits and impairments:  Decreased mobility, Decreased range of motion, Decreased strength, Increased muscle spasms, Pain, Increased fascial restricitons, Impaired flexibility, Improper body mechanics, Postural dysfunction, Impaired sensation, Decreased balance  Visit Diagnosis: Chronic midline low back pain with sciatica, sciatica laterality unspecified  Other disturbances of skin sensation  Muscle weakness (generalized)  Stiffness of left hip, not elsewhere classified     Problem List Patient Active Problem List   Diagnosis Date Noted  . Back pain 04/12/2014  . Arthritis 04/12/2014  . Knee pain 04/12/2014  . Unspecified essential hypertension 11/27/2013  . Osteoarthritis of left knee 07/16/2012    Sears Oran 10/23/2016, 9:39 AM  [place], [place], [address] Phone: [phone]   Fax:  [phone]  Name: Margaret [name] MRN: [id number] Date of Birth: 10-08-1951   Raeford Razor, PT 10/23/16 9:40 AM Phone: [phone] Fax: [phone]

## 2016-10-30 ENCOUNTER — Ambulatory Visit: Payer: Federal, State, Local not specified - PPO | Admitting: Physical Therapy

## 2016-10-30 DIAGNOSIS — M5440 Lumbago with sciatica, unspecified side: Principal | ICD-10-CM

## 2016-10-30 DIAGNOSIS — R208 Other disturbances of skin sensation: Secondary | ICD-10-CM

## 2016-10-30 DIAGNOSIS — M6281 Muscle weakness (generalized): Secondary | ICD-10-CM

## 2016-10-30 DIAGNOSIS — G8929 Other chronic pain: Secondary | ICD-10-CM

## 2016-10-30 DIAGNOSIS — M25652 Stiffness of left hip, not elsewhere classified: Secondary | ICD-10-CM

## 2016-10-30 NOTE — Therapy (Signed)
[facility] [address] [place], [place], [address] Phone: [phone]   Fax:  [phone]  Physical Therapy Treatment/Discharge  Patient Details  Name: Margaret [name] MRN: [id number] Date of Birth: 01/25/1951 Referring Provider: Dr. Stanley Harrison  Encounter Date: 10/30/2016      PT End of Session - 10/30/16 0819    Visit Number 9   Number of Visits 10   Date for PT Re-Evaluation 10/31/16   PT Start Time 0808   PT Stop Time 0856   PT Time Calculation (min) 48 min   Activity Tolerance Patient tolerated treatment well   Behavior During Therapy [facility] for tasks assessed/performed      Past Medical History:  Diagnosis Date  . Allergy   . Anemia   . Dyspareunia   . Endometriosis   . High cholesterol   . Hx of abnormal Pap smear 1980's  . Hypertension   . Osteopenia   . Ulcer ([facility])   . Urinary incontinence     Past Surgical History:  Procedure Laterality Date  . ABDOMINAL HYSTERECTOMY  09/2008   TVH/BSO--Dr. Silva with TVT  . BLADDER SUSPENSION  09/2008   Dr. Silva  . CERVIX LESION DESTRUCTION  1980   hx abnormal pap--dysplasia    There were no vitals filed for this visit.      Subjective Assessment - 10/30/16 0811    Subjective I really felt the roller thing last time.  Upper back tension today.  Ready to DC.  Patient purchased a few different props for fitness in her home.    Currently in Pain? No/denies                 [facility] Adult PT Treatment/Exercise - 10/30/16 0823      Self-Care   Other Self-Care Comments  full HEP review     Lumbar Exercises: Stretches   Active Hamstring Stretch 3 reps;30 seconds   Single Knee to Chest Stretch 1 rep;30 seconds   Piriformis Stretch 1 rep;30 seconds     Lumbar Exercises: Aerobic   Elliptical level 4 ramp and level 1 resistance      Lumbar Exercises: Supine   Ab Set 5 reps   AB Set Limitations PPT   Clam 10 reps   Bent Knee Raise 10 reps   Straight Leg Raise --     Lumbar Exercises: Sidelying   Other Sidelying Lumbar Exercises QL stretch for L quadratus lumborum min overpressure , sidebending over bolster 30 sec x 3   prior to stabilization      Lumbar Exercises: Quadruped   Madcat/Old Horse 10 reps   Opposite Arm/Leg Raise 10 reps   Other Quadruped Lumbar Exercises childs pose FW and lateral     Moist Heat Therapy   Number Minutes Moist Heat 10 Minutes   Moist Heat Location Lumbar Spine                PT Education - 10/30/16 1210    Education provided Yes   Education Details DC, HEP and consistency   Person(s) Educated Patient   Methods Explanation   Comprehension Verbalized understanding             PT Long Term Goals - 10/30/16 1212      PT LONG TERM GOAL #1   Title understand proper posture and body mechanics and demo during spontaneous activity in clinic.    Status Achieved     PT LONG TERM GOAL #2   Title   Pt will be I with HEP for core, flexibility.    Status Achieved     PT LONG TERM GOAL #3   Title Pt will be able to demo normal AROM in lumbar spine without pain (stretch only)    Status Achieved     PT LONG TERM GOAL #4   Title Pt will take standing, walking breaks every hour at work to reduce compressive forces in the spine.    Status Achieved     PT LONG TERM GOAL #5   Title Pt will be able to walk for fitness 20 min at a time without increasing pain.    Status Achieved               Plan - 10/30/16 1211    Clinical Impression Statement Pt ready for DC.  Has good intentions of continuing to use what she has learned to preserve low back function.     PT Next Visit Plan NA   PT Home Exercise Plan hamstring , standing calf, piriformis (figure 4, knee to opp shoulder ) and bridge, prepilates, quadruped   Consulted and Agree with Plan of Care Patient      Patient will benefit from skilled therapeutic intervention in order to improve the following deficits and  impairments:     Visit Diagnosis: Chronic midline low back pain with sciatica, sciatica laterality unspecified  Other disturbances of skin sensation  Muscle weakness (generalized)  Stiffness of left hip, not elsewhere classified     Problem List Patient Active Problem List   Diagnosis Date Noted  . Back pain 04/12/2014  . Arthritis 04/12/2014  . Knee pain 04/12/2014  . Unspecified essential hypertension 11/27/2013  . Osteoarthritis of left knee 07/16/2012    PAA,JENNIFER 10/30/2016, 12:13 PM  [facility] [address] [place], [place], [address] Phone: [phone]   Fax:  [phone]  Name: Margaret [name] MRN: [id number] Date of Birth: [birth year]  .PHYSICAL THERAPY DISCHARGE SUMMARY  Visits from Start of Care: 9  Current functional level related to goals / functional outcomes: See goals.  Has mild pain in L low back intermittently.    Remaining deficits: None limiting function.    Education / Equipment: HEP, gym ex, core, posture and lifting   Plan: Patient agrees to discharge.  Patient goals were met. Patient is being discharged due to meeting the stated rehab goals.  ?????    Raeford Razor, PT 10/30/16 12:15 PM Phone: [phone] Fax: [phone]

## 2017-01-27 ENCOUNTER — Other Ambulatory Visit: Payer: Self-pay | Admitting: Family Medicine

## 2017-01-27 DIAGNOSIS — Z1231 Encounter for screening mammogram for malignant neoplasm of breast: Secondary | ICD-10-CM
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
## 2017-05-13 ENCOUNTER — Ambulatory Visit (INDEPENDENT_AMBULATORY_CARE_PROVIDER_SITE_OTHER): Payer: Federal, State, Local not specified - PPO | Admitting: Orthopedic Surgery

## 2017-05-13 ENCOUNTER — Ambulatory Visit (INDEPENDENT_AMBULATORY_CARE_PROVIDER_SITE_OTHER): Payer: Federal, State, Local not specified - PPO

## 2017-05-13 ENCOUNTER — Encounter: Payer: Self-pay | Admitting: Orthopedic Surgery

## 2017-05-13 VITALS — BP 120/76 | HR 76 | Ht 67.0 in | Wt 175.0 lb

## 2017-05-13 DIAGNOSIS — M25562 Pain in left knee: Secondary | ICD-10-CM

## 2017-05-13 NOTE — Progress Notes (Signed)
NEW PATIENT OFFICE VISIT    Chief Complaint  Patient presents with  . Knee Pain    left knee pain, no injury.    66 years old female history of osteoarthritis left knee last x-ray 2015  She presents for evaluation of increasing left knee pain  Sometime over the last month with no history of trauma she had new onset of increased pain in her lower back left groin left knee associated with cramping.  She worked on it herself with some natural methods and medications and seems to have gotten the knee pain under control.  She did have some swelling which is gone down, her back pain and groin pain have resolved. Her cramping has improved. She now has mild to moderate pain over the medial joint line which is constant dull and is not associated with any giving way symptoms of the knee    Review of Systems  Constitutional: Negative for chills and fever.  Skin: Negative for rash.  Neurological: Negative for tingling and sensory change.    Past Medical History:  Diagnosis Date  . Allergy   . Anemia   . Dyspareunia   . Endometriosis   . High cholesterol   . Hx of abnormal Pap smear 1980's  . Hypertension   . Osteopenia   . Ulcer   . Urinary incontinence     Past Surgical History:  Procedure Laterality Date  . ABDOMINAL HYSTERECTOMY  09/2008   TVH/BSO--Dr. Quincy Simmonds with TVT  . BLADDER SUSPENSION  09/2008   Dr. Quincy Simmonds  . CERVIX LESION DESTRUCTION  1980   hx abnormal pap--dysplasia    Family History  Problem Relation Age of Onset  . Diabetes Mother   . Hypertension Mother   . Thyroid disease Sister   . Seizures Brother   . Diabetes Brother   . Hypertension Brother   . Diabetes Brother   . Arthritis Unknown   . Diabetes Unknown    Social History  Substance Use Topics  . Smoking status: Never Smoker  . Smokeless tobacco: Never Used  . Alcohol use Yes     Comment: occasional    BP 120/76   Pulse 76   Ht 5\' 7"  (1.702 m)   Wt 175 lb (79.4 kg)   BMI 27.41 kg/m    Physical Exam  Constitutional: She is oriented to person, place, and time. She appears well-developed and well-nourished.  Musculoskeletal:       Left knee: Medial joint line tenderness noted.  Neurological: She is alert and oriented to person, place, and time.  Psychiatric: She has a normal mood and affect.  Vitals reviewed.   Left Knee Exam  Swelling: None Effusion: No  Tenderness  The patient is experiencing tenderness in the medial joint line.  Range of Motion  Extension: Normal Flexion:     130  Tests  McMurrays:  Medial - Negative      Lateral - Negative Lachman:  Anterior - Negative     Drawer:       Anterior - Negative     Posterior - Negative Varus:  Negative Valgus: Negative  Comments:  Neurovascular exam was intact    Right Knee Exam   Tenderness  None  Range of Motion  Normal right knee ROM  Muscle Strength  Normal right knee strength  Tests  Drawer:       Anterior - Negative    Posterior - Negative     Encounter Diagnosis  Name Primary?  . Left  knee pain, unspecified chronicity Yes   X-ray was obtained compared to the x-ray from 2015 I don't see any major changes she had mild arthritis at that time so it may be some exacerbation.  Differential diagnosis meniscal tear medial Osteoarthritis Avascular necrosis medial femoral condyle  PLAN:   Recommend continue natural methods as she does not want to take naproxen, she took one or 2 tablets in the side effect profile made her apprehensive.  We placed her in an economy hinged wrap on a brace, shoe wear that for 6 weeks with follow-up at that point

## 2017-05-15 ENCOUNTER — Encounter: Payer: Self-pay | Admitting: Orthopedic Surgery

## 2017-05-16 ENCOUNTER — Ambulatory Visit
Admission: RE | Admit: 2017-05-16 | Discharge: 2017-05-16 | Disposition: A | Discharge status: Home / Self Care | Payer: Federal, State, Local not specified - PPO | Source: Ambulatory Visit | Attending: Family Medicine | Admitting: Family Medicine

## 2017-05-16 DIAGNOSIS — Z1231 Encounter for screening mammogram for malignant neoplasm of breast: Secondary | ICD-10-CM

## 2017-05-19 ENCOUNTER — Ambulatory Visit: Payer: Federal, State, Local not specified - PPO

## 2017-06-11 ENCOUNTER — Ambulatory Visit (INDEPENDENT_AMBULATORY_CARE_PROVIDER_SITE_OTHER): Payer: Federal, State, Local not specified - PPO | Admitting: Obstetrics and Gynecology

## 2017-06-11 ENCOUNTER — Encounter: Payer: Self-pay | Admitting: Obstetrics and Gynecology

## 2017-06-11 VITALS — BP 118/70 | HR 80 | Resp 16 | Ht 65.75 in | Wt 177.0 lb

## 2017-06-11 DIAGNOSIS — Z01419 Encounter for gynecological examination (general) (routine) without abnormal findings: Secondary | ICD-10-CM | POA: Diagnosis not present

## 2017-06-11 DIAGNOSIS — Z0000 Encounter for general adult medical examination without abnormal findings: Secondary | ICD-10-CM | POA: Diagnosis not present

## 2017-06-11 DIAGNOSIS — Z113 Encounter for screening for infections with a predominantly sexual mode of transmission: Secondary | ICD-10-CM | POA: Diagnosis not present

## 2017-06-11 LAB — POCT URINALYSIS DIPSTICK
Bilirubin, UA: NEGATIVE
Blood, UA: NEGATIVE
Glucose, UA: NEGATIVE
Ketones, UA: NEGATIVE
Leukocytes, UA: NEGATIVE
Nitrite, UA: NEGATIVE
Protein, UA: NEGATIVE
Urobilinogen, UA: 0.2 E.U./dL
pH, UA: 5 (ref 5.0–8.0)
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
## 2017-06-11 NOTE — Patient Instructions (Signed)

## 2017-06-12 LAB — CBC
Hematocrit: 35.1 % (ref 34.0–46.6)
Hemoglobin: 11.4 g/dL (ref 11.1–15.9)
MCH: 26.2 pg — ABNORMAL LOW (ref 26.6–33.0)
MCHC: 32.5 g/dL (ref 31.5–35.7)
MCV: 81 fL (ref 79–97)
Platelets: 255 10*3/uL (ref 150–379)
RBC: 4.35 x10E6/uL (ref 3.77–5.28)
RDW: 14.6 % (ref 12.3–15.4)
WBC: 4.9 10*3/uL (ref 3.4–10.8)

## 2017-06-12 LAB — HIV ANTIBODY (ROUTINE TESTING W REFLEX): HIV Screen 4th Generation wRfx: NONREACTIVE

## 2017-06-12 LAB — TSH: TSH: 2.13 u[IU]/mL (ref 0.450–4.500)

## 2017-06-12 LAB — COMPREHENSIVE METABOLIC PANEL
ALT: 8 IU/L (ref 0–32)
AST: 17 IU/L (ref 0–40)
Albumin/Globulin Ratio: 1.5 (ref 1.2–2.2)
Albumin: 4.2 g/dL (ref 3.6–4.8)
Alkaline Phosphatase: 92 IU/L (ref 39–117)
BUN/Creatinine Ratio: 14 (ref 12–28)
BUN: 10 mg/dL (ref 8–27)
Bilirubin Total: 0.3 mg/dL (ref 0.0–1.2)
CO2: 25 mmol/L (ref 20–29)
Calcium: 9.5 mg/dL (ref 8.7–10.3)
Chloride: 100 mmol/L (ref 96–106)
Creatinine, Ser: 0.69 mg/dL (ref 0.57–1.00)
GFR calc Af Amer: 106 mL/min/{1.73_m2} (ref >59)
GFR calc non Af Amer: 92 mL/min/{1.73_m2} (ref >59)
Globulin, Total: 2.8 g/dL (ref 1.5–4.5)
Glucose: 77 mg/dL (ref 65–99)
Potassium: 4 mmol/L (ref 3.5–5.2)
Sodium: 141 mmol/L (ref 134–144)
Total Protein: 7 g/dL (ref 6.0–8.5)

## 2017-06-12 LAB — LIPID PANEL
Chol/HDL Ratio: 2.9 ratio (ref 0.0–4.4)
Cholesterol, Total: 181 mg/dL (ref 100–199)
HDL: 63 mg/dL (ref >39)
LDL Calculated: 102 mg/dL — ABNORMAL HIGH (ref 0–99)
Triglycerides: 78 mg/dL (ref 0–149)
VLDL Cholesterol Cal: 16 mg/dL (ref 5–40)

## 2017-06-12 LAB — HEPATITIS C ANTIBODY: Hep C Virus Ab: <0.1 s/co ratio (ref 0.0–0.9)

## 2017-06-23 ENCOUNTER — Ambulatory Visit: Payer: Federal, State, Local not specified - PPO | Admitting: Orthopedic Surgery

## 2017-07-21 ENCOUNTER — Ambulatory Visit: Payer: Federal, State, Local not specified - PPO | Admitting: Orthopedic Surgery

## 2017-07-29 ENCOUNTER — Encounter: Payer: Self-pay | Admitting: Orthopedic Surgery

## 2017-07-30 ENCOUNTER — Ambulatory Visit: Payer: Federal, State, Local not specified - PPO | Admitting: Orthopedic Surgery

## 2017-09-10 ENCOUNTER — Encounter: Payer: Self-pay | Admitting: Orthopedic Surgery

## 2017-09-10 ENCOUNTER — Ambulatory Visit (INDEPENDENT_AMBULATORY_CARE_PROVIDER_SITE_OTHER): Payer: Federal, State, Local not specified - PPO | Admitting: Orthopedic Surgery

## 2017-09-10 VITALS — BP 110/68 | HR 98 | Ht 66.0 in | Wt 175.0 lb

## 2017-09-10 DIAGNOSIS — G8929 Other chronic pain: Secondary | ICD-10-CM

## 2017-09-10 DIAGNOSIS — M5442 Lumbago with sciatica, left side: Secondary | ICD-10-CM

## 2017-09-10 DIAGNOSIS — M5441 Lumbago with sciatica, right side: Secondary | ICD-10-CM

## 2017-09-10 DIAGNOSIS — M25562 Pain in left knee: Secondary | ICD-10-CM | POA: Diagnosis not present

## 2017-09-10 NOTE — Progress Notes (Signed)
Progress Note   Patient ID: Margaret [name], female   DOB: [birth year], 66 y.o.   MRN: [id number]  Chief Complaint  Patient presents with  . Knee Pain    left knee pain into left hip trochanter area at times    66 year old female comes in complaining of new onset left hip pain radiating from her back into her left lower leg. She is concerned that it's her hip joint.  She had symptoms like this before we treated her with physical therapy  She says is actually starting to get better but when it came on about 3-4 weeks ago she noticed a dull constant aching pain in her left hip and iliac crest with associated back pain and radiation into the left lateral leg.       Review of Systems  Constitutional: Negative.   Gastrointestinal: Negative.   Genitourinary: Negative.   Neurological: Negative.    Current Meds  Medication Sig  . aspirin 81 MG tablet Take 162 mg by mouth daily.  . Cholecalciferol (VITAMIN D) 2000 UNITS CAPS Take 2,000 Units by mouth daily.   . Ferrous Sulfate (IRON) 325 (65 Fe) MG TABS Take by mouth daily.  . fexofenadine (ALLEGRA) 180 MG tablet Take 180 mg by mouth daily.  . fluticasone (FLONASE) 50 MCG/ACT nasal spray Place 1 spray into both nostrils daily as needed for allergies or rhinitis.   Marland Kitchen ibuprofen (ADVIL,MOTRIN) 200 MG tablet Take 400 mg by mouth every 6 (six) hours as needed for headache, mild pain or moderate pain.  Marland Kitchen lisinopril-hydrochlorothiazide (PRINZIDE,ZESTORETIC) 10-12.5 MG per tablet Take 1 tablet by mouth daily.  . Multiple Vitamins-Minerals (MULTIVITAMIN ADULT PO) Take 1 tablet by mouth daily.  . TURMERIC PO Take by mouth daily.    Past Medical History:  Diagnosis Date  . Allergy   . Anemia   . Dyspareunia   . Endometriosis   . High cholesterol   . Hx of abnormal Pap smear 1980's  . Hypertension   . Osteopenia   . Ulcer   . Urinary incontinence      Allergies  Allergen Reactions  . Ciprofloxacin Nausea And Vomiting  . Sulfa  Antibiotics     Unknown    BP 110/68   Pulse 98   Ht 5\' 6"  (1.676 m)   Wt 175 lb (79.4 kg)   BMI 28.25 kg/m    Physical Exam Gen. appearance the patient's appearance is normal with normal grooming and  hygiene The patient is oriented to person place and time Mood and affect are normal   Ortho Exam  She is tender in her lower back she has pain with extension.  Her right hip range of motion is normal as is the left. She has a painless 45 flexion test of the left hip against resistance indicating no real hip joint pathology. Motor exam in the left leg normal skin intact as well. She has normal sensation and good pulse in the left lower extremity as well as the right  Her straight leg raise did cause her discomfort on the left side negative on the right and the pain was in the back of the leg.  Ankle joint was stable on the left and right knee normal appearance palpation without effusion on the right and left side  Medical decision-making Left knee x-ray  3 views left knee  From July 18  Done for left knee pain back at that time  Compared to the previous x-ray taken a few years back and  see any change in the x-ray. She has valgus alignment to the knee. She has mild joint space narrowing. There are no secondary bone changes.  The diagnosis is osteoarthritis no fracture    Encounter Diagnoses  Name Primary?  . Left knee pain, unspecified chronicity Yes  . Chronic midline low back pain with bilateral sciatica    Left knee improved  Back pain worsened and exacerbated  Recommend physical therapy at home, heat and ibuprofen  Follow-up with [place] as needed   Medical decision-making we had one problem established improved a second problem worse and new. We recommended that she have over-the-counter medication home physical therapy     Arther Abbott, MD 09/10/2017 4:50 PM

## 2017-10-15 ENCOUNTER — Encounter (HOSPITAL_BASED_OUTPATIENT_CLINIC_OR_DEPARTMENT_OTHER): Payer: Self-pay | Admitting: Emergency Medicine

## 2017-10-15 ENCOUNTER — Emergency Department (HOSPITAL_BASED_OUTPATIENT_CLINIC_OR_DEPARTMENT_OTHER)
Admission: EM | Admit: 2017-10-15 | Discharge: 2017-10-15 | Disposition: A | Discharge status: Home / Self Care | Payer: Federal, State, Local not specified - PPO | Attending: Emergency Medicine | Admitting: Emergency Medicine

## 2017-10-15 ENCOUNTER — Other Ambulatory Visit: Payer: Self-pay

## 2017-10-15 ENCOUNTER — Emergency Department (HOSPITAL_BASED_OUTPATIENT_CLINIC_OR_DEPARTMENT_OTHER): Payer: Federal, State, Local not specified - PPO

## 2017-10-15 DIAGNOSIS — Y929 Unspecified place or not applicable: Secondary | ICD-10-CM | POA: Insufficient documentation

## 2017-10-15 DIAGNOSIS — W2209XA Striking against other stationary object, initial encounter: Secondary | ICD-10-CM | POA: Insufficient documentation

## 2017-10-15 DIAGNOSIS — Y939 Activity, unspecified: Secondary | ICD-10-CM | POA: Insufficient documentation

## 2017-10-15 DIAGNOSIS — Z79899 Other long term (current) drug therapy: Secondary | ICD-10-CM | POA: Insufficient documentation

## 2017-10-15 DIAGNOSIS — Y998 Other external cause status: Secondary | ICD-10-CM | POA: Diagnosis not present

## 2017-10-15 DIAGNOSIS — I10 Essential (primary) hypertension: Secondary | ICD-10-CM | POA: Diagnosis not present

## 2017-10-15 DIAGNOSIS — S92512A Displaced fracture of proximal phalanx of left lesser toe(s), initial encounter for closed fracture: Secondary | ICD-10-CM | POA: Insufficient documentation

## 2017-10-15 DIAGNOSIS — S99922A Unspecified injury of left foot, initial encounter: Secondary | ICD-10-CM | POA: Diagnosis present

## 2017-10-15 MED ORDER — HYDROCODONE-ACETAMINOPHEN 5-325 MG PO TABS: 1.0000 | ORAL_TABLET | Freq: Four times a day (QID) | ORAL | 0 refills | Status: DC | PRN

## 2017-10-15 MED FILL — HYDROCODON-APAP 5-325: 5-325 | 2 days supply | Qty: 10 | Fill #0

## 2017-10-15 NOTE — ED Triage Notes (Signed)
Pt stubbed her toe last night.  3rd toe to left foot, swollen and painful;

## 2017-10-15 NOTE — ED Provider Notes (Signed)
[facility] EMERGENCY DEPARTMENT Provider Note   CSN: [id number] Arrival date & time: 10/15/17  0725     History   Chief Complaint Chief Complaint  Patient presents with  . Toe Pain    HPI Margaret [name] is a 66 y.o. female.  HPI Patient presents with pain to the third digit of her left foot after stubbing her toe yesterday evening.  She has had some swelling at the site.  But no numbness.  Denies any other injury. Past Medical History:  Diagnosis Date  . Allergy   . Anemia   . Dyspareunia   . Endometriosis   . High cholesterol   . Hx of abnormal Pap smear 1980's  . Hypertension   . Osteopenia   . Ulcer   . Urinary incontinence     Patient Active Problem List   Diagnosis Date Noted  . Back pain 04/12/2014  . Arthritis 04/12/2014  . Knee pain 04/12/2014  . Unspecified essential hypertension 11/27/2013  . Osteoarthritis of left knee 07/16/2012    Past Surgical History:  Procedure Laterality Date  . ABDOMINAL HYSTERECTOMY  09/2008   TVH/BSO--Dr. Quincy Simmonds with TVT  . BLADDER SUSPENSION  09/2008   Dr. Quincy Simmonds  . CERVIX LESION DESTRUCTION  1980   hx abnormal pap--dysplasia    OB History    Gravida Para Term Preterm AB Living   3 3 3     3    SAB TAB Ectopic Multiple Live Births                   Home Medications    Prior to Admission medications   Medication Sig Start Date End Date Taking? Authorizing Provider  aspirin 81 MG tablet Take 162 mg by mouth daily.    [provider]  Cholecalciferol (VITAMIN D) 2000 UNITS CAPS Take 2,000 Units by mouth daily.     [provider]  Ferrous Sulfate (IRON) 325 (65 Fe) MG TABS Take by mouth daily.    [provider]  fexofenadine (ALLEGRA) 180 MG tablet Take 180 mg by mouth daily.    [provider]  fluticasone (FLONASE) 50 MCG/ACT nasal spray Place 1 spray into both nostrils daily as needed for allergies or rhinitis.     [provider]    HYDROcodone-acetaminophen (NORCO) 5-325 MG tablet Take 1 tablet by mouth every 6 (six) hours as needed. 10/15/17   Julianne Rice, MD  ibuprofen (ADVIL,MOTRIN) 200 MG tablet Take 400 mg by mouth every 6 (six) hours as needed for headache, mild pain or moderate pain.    [provider]  lisinopril-hydrochlorothiazide (PRINZIDE,ZESTORETIC) 10-12.5 MG per tablet Take 1 tablet by mouth daily. 05/05/15   [provider]  Multiple Vitamins-Minerals (MULTIVITAMIN ADULT PO) Take 1 tablet by mouth daily.    [provider]  TURMERIC PO Take by mouth daily.    [provider]    Family History Family History  Problem Relation Age of Onset  . Diabetes Mother   . Hypertension Mother   . Thyroid disease Sister   . Seizures Brother   . Diabetes Brother   . Hypertension Brother   . Diabetes Brother   . Arthritis Unknown   . Diabetes Unknown     Social History Social History   Tobacco Use  . Smoking status: Never Smoker  . Smokeless tobacco: Never Used  Substance Use Topics  . Alcohol use: Yes    Comment: occasional  . Drug use: No  Allergies   Ciprofloxacin and Sulfa antibiotics   Review of Systems Review of Systems  Musculoskeletal: Positive for arthralgias.  Skin: Negative for wound.  Neurological: Negative for weakness and numbness.  All other systems reviewed and are negative.    Physical Exam Updated Vital Signs BP 139/73 (BP Location: Left Arm)   Pulse 71   Temp 98.3 F (36.8 C) (Oral)   Resp 18   Ht 5\' 6"  (1.676 m)   Wt 79.8 kg (176 lb)   SpO2 97%   BMI 28.41 kg/m   Physical Exam  Constitutional: She is oriented to person, place, and time. She appears well-developed and well-nourished. No distress.  HENT:  Head: Normocephalic and atraumatic.  Eyes: EOM are normal. Pupils are equal, round, and reactive to light.  Neck: Normal range of motion. Neck supple.  Cardiovascular: Normal rate.  Pulmonary/Chest: Effort normal.   Abdominal: Soft.  Musculoskeletal: Normal range of motion. She exhibits edema and tenderness.  Patient has tenderness to the third digit of the left foot to palpation.  Mildly swollen without obvious deformity.  Good distal cap refill.  Neurological: She is alert and oriented to person, place, and time.  Sensation to the left foot fully intact.  Moving all toes.  Skin: Skin is warm and dry. Capillary refill takes less than 2 seconds. No rash noted. No erythema.  Psychiatric: She has a normal mood and affect. Her behavior is normal.  Nursing note and vitals reviewed.    ED Treatments / Results  Labs (all labs ordered are listed, but only abnormal results are displayed) Labs Reviewed - No data to display  EKG  EKG Interpretation None       Radiology Dg Toe 3rd Left  Result Date: 10/15/2017 CLINICAL DATA:  Stubbed third toe on suitcase EXAM: LEFT THIRD TOE COMPARISON:  None FINDINGS: There is an acute obliquely oriented a fracture involving the shaft of the third proximal phalanx. No dislocations identified. A second, nondisplaced fracture involves the dorsal plate of the third distal phalanx. IMPRESSION: 1. Acute fracture involves the shaft of the third proximal phalanx. There is also a fracture involving the dorsal plate of the distal phalanx. Electronically Signed   By: Kerby Moors M.D.   On: 10/15/2017 08:45    Procedures Procedures (including critical care time)  Medications Ordered in ED Medications - No data to display   Initial Impression / Assessment and Plan / ED Course  I have reviewed the triage vital signs and the nursing notes.  Pertinent labs & imaging results that were available during my care of the patient were reviewed by me and considered in my medical decision making (see chart for details).     Placed in postoperative shoe and given crutches.  Advised to follow-up with orthopedics in 1 week.  Return precautions given.  Final Clinical Impressions(s)  / ED Diagnoses   Final diagnoses:  Closed displaced fracture of proximal phalanx of lesser toe of left foot, initial encounter    ED Discharge Orders        Ordered    HYDROcodone-acetaminophen (NORCO) 5-325 MG tablet  Every 6 hours PRN     10/15/17 0907       Julianne Rice, MD 10/16/17 1528

## 2017-10-16 ENCOUNTER — Ambulatory Visit (INDEPENDENT_AMBULATORY_CARE_PROVIDER_SITE_OTHER): Payer: Federal, State, Local not specified - PPO | Admitting: Orthopaedic Surgery

## 2017-10-16 ENCOUNTER — Encounter (INDEPENDENT_AMBULATORY_CARE_PROVIDER_SITE_OTHER): Payer: Self-pay | Admitting: Orthopaedic Surgery

## 2017-10-16 DIAGNOSIS — S92515A Nondisplaced fracture of proximal phalanx of left lesser toe(s), initial encounter for closed fracture: Secondary | ICD-10-CM

## 2017-10-16 NOTE — Progress Notes (Signed)
Office Visit Note   Patient: Margaret [name]           Date of Birth: 1951/06/03           MRN: [id number] Visit Date: 10/16/2017              Requested by: Cari Caraway, [place], [place] [address] PCP: Cari Caraway, MD   Assessment & Plan: Visit Diagnoses: No diagnosis found.  Plan: I gave her reassurance that the fractures were stable fractures of her foot and she can weight-bear as tolerated in a postoperative shoe and increase her activities as comfort allows.  All questions and concerns were answered and addressed.  Views of her left foot.  I will see her back in 4 weeks for final 3  Follow-Up Instructions: Return in about 4 weeks (around 11/13/2017).   Orders:  No orders of the defined types were placed in this encounter.  No orders of the defined types were placed in this encounter.     Procedures: No procedures performed   Clinical Data: No additional findings.   Subjective: No chief complaint on file. The patient is a referral from the emergency room.  She "stubbed her toe on her left foot which is the third toe on 10/14/2017.  She went to [facility] [place] on 10/15/2017 and was told she had a fracture of her third toe.  This was in 2 areas.  She has had a lot of pain and swelling with this.  She was given hydrocodone and has had difficulty weightbearing.  She is not a smoker not a diabetic and denies any other active medical problems other than high blood pressure.  HPI  Review of Systems She currently denies any neuropathy in her feet.  She denies any fever, chills, nausea, vomiting, chest pain, shortness of breath.  Objective: Vital Signs: There were no vitals taken for this visit.  Physical Exam She is alert and oriented x3 and in no acute distress Ortho Exam Examination of her left foot does show some swelling of her third toe but clinically is well located.  She is neurovascularly intact.  The remainder of her foot exam is  normal. Specialty Comments:  No specialty comments available.  Imaging: No results found. X-rays reviewed on the canopy system independently show displaced fractures of her left foot third toe proximal phalanx as well as a small avulsion of the distal phalanx at the dorsal lip.  Both of these are stable appearing fractures.  PMFS History: Patient Active Problem List   Diagnosis Date Noted  . Back pain 04/12/2014  . Arthritis 04/12/2014  . Knee pain 04/12/2014  . Unspecified essential hypertension 11/27/2013  . Osteoarthritis of left knee 07/16/2012   Past Medical History:  Diagnosis Date  . Allergy   . Anemia   . Dyspareunia   . Endometriosis   . High cholesterol   . Hx of abnormal Pap smear 1980's  . Hypertension   . Osteopenia   . Ulcer   . Urinary incontinence     Family History  Problem Relation Age of Onset  . Diabetes Mother   . Hypertension Mother   . Thyroid disease Sister   . Seizures Brother   . Diabetes Brother   . Hypertension Brother   . Diabetes Brother   . Arthritis Unknown   . Diabetes Unknown     Past Surgical History:  Procedure Laterality Date  . ABDOMINAL HYSTERECTOMY  09/2008   TVH/BSO--Dr. Quincy Simmonds  with TVT  . BLADDER SUSPENSION  09/2008   Dr. Quincy Simmonds  . CERVIX LESION DESTRUCTION  1980   hx abnormal pap--dysplasia   Social History   Occupational History  . Not on file  Tobacco Use  . Smoking status: Never Smoker  . Smokeless tobacco: Never Used  Substance and Sexual Activity  . Alcohol use: Yes    Comment: occasional  . Drug use: No  . Sexual activity: No    Birth control/protection: Surgical    Comment: TAH/BSO

## 2017-10-21 ENCOUNTER — Telehealth (INDEPENDENT_AMBULATORY_CARE_PROVIDER_SITE_OTHER): Payer: Self-pay | Admitting: Orthopaedic Surgery

## 2017-10-21 NOTE — Telephone Encounter (Signed)
Note for patient at front desk. She is aware.

## 2017-10-21 NOTE — Telephone Encounter (Signed)
Ok for note 

## 2017-10-21 NOTE — Telephone Encounter (Signed)
Patient states a note was written for her to go back to work on 12/10 but with the weather and her in a shoe due to her toe she would like a new note written for her to go back to work on 10/23/17. Patients # is [phone]

## 2017-10-21 NOTE — Telephone Encounter (Signed)
Ok for the return to work note however she needs it

## 2017-10-22 ENCOUNTER — Ambulatory Visit (INDEPENDENT_AMBULATORY_CARE_PROVIDER_SITE_OTHER): Payer: Federal, State, Local not specified - PPO | Admitting: Orthopaedic Surgery

## 2017-10-23 ENCOUNTER — Other Ambulatory Visit: Payer: Self-pay

## 2017-10-23 ENCOUNTER — Inpatient Hospital Stay (HOSPITAL_BASED_OUTPATIENT_CLINIC_OR_DEPARTMENT_OTHER)
Admission: EM | Admit: 2017-10-23 | Discharge: 2017-10-25 | DRG: 394 | Disposition: A | Discharge status: Home / Self Care | Payer: Federal, State, Local not specified - PPO | Attending: Internal Medicine | Admitting: Internal Medicine

## 2017-10-23 ENCOUNTER — Encounter (HOSPITAL_BASED_OUTPATIENT_CLINIC_OR_DEPARTMENT_OTHER): Payer: Self-pay | Admitting: *Deleted

## 2017-10-23 DIAGNOSIS — E7800 Pure hypercholesterolemia, unspecified: Secondary | ICD-10-CM | POA: Diagnosis present

## 2017-10-23 DIAGNOSIS — Z79899 Other long term (current) drug therapy: Secondary | ICD-10-CM | POA: Diagnosis not present

## 2017-10-23 DIAGNOSIS — Z7982 Long term (current) use of aspirin: Secondary | ICD-10-CM | POA: Diagnosis not present

## 2017-10-23 DIAGNOSIS — K633 Ulcer of intestine: Secondary | ICD-10-CM | POA: Diagnosis present

## 2017-10-23 DIAGNOSIS — G8929 Other chronic pain: Secondary | ICD-10-CM | POA: Diagnosis present

## 2017-10-23 DIAGNOSIS — K5730 Diverticulosis of large intestine without perforation or abscess without bleeding: Secondary | ICD-10-CM | POA: Diagnosis present

## 2017-10-23 DIAGNOSIS — Z882 Allergy status to sulfonamides status: Secondary | ICD-10-CM

## 2017-10-23 DIAGNOSIS — D123 Benign neoplasm of transverse colon: Secondary | ICD-10-CM | POA: Diagnosis present

## 2017-10-23 DIAGNOSIS — R112 Nausea with vomiting, unspecified: Secondary | ICD-10-CM | POA: Diagnosis present

## 2017-10-23 DIAGNOSIS — E876 Hypokalemia: Secondary | ICD-10-CM | POA: Diagnosis present

## 2017-10-23 DIAGNOSIS — M549 Dorsalgia, unspecified: Secondary | ICD-10-CM | POA: Diagnosis present

## 2017-10-23 DIAGNOSIS — K921 Melena: Secondary | ICD-10-CM | POA: Diagnosis present

## 2017-10-23 DIAGNOSIS — E860 Dehydration: Secondary | ICD-10-CM | POA: Diagnosis present

## 2017-10-23 DIAGNOSIS — D62 Acute posthemorrhagic anemia: Secondary | ICD-10-CM | POA: Diagnosis present

## 2017-10-23 DIAGNOSIS — I10 Essential (primary) hypertension: Secondary | ICD-10-CM | POA: Diagnosis present

## 2017-10-23 DIAGNOSIS — K219 Gastro-esophageal reflux disease without esophagitis: Secondary | ICD-10-CM | POA: Diagnosis present

## 2017-10-23 DIAGNOSIS — Z8711 Personal history of peptic ulcer disease: Secondary | ICD-10-CM

## 2017-10-23 DIAGNOSIS — K559 Vascular disorder of intestine, unspecified: Principal | ICD-10-CM | POA: Diagnosis present

## 2017-10-23 DIAGNOSIS — K922 Gastrointestinal hemorrhage, unspecified: Secondary | ICD-10-CM | POA: Diagnosis present

## 2017-10-23 DIAGNOSIS — K625 Hemorrhage of anus and rectum: Secondary | ICD-10-CM | POA: Diagnosis not present

## 2017-10-23 DIAGNOSIS — Z881 Allergy status to other antibiotic agents status: Secondary | ICD-10-CM

## 2017-10-23 DIAGNOSIS — R197 Diarrhea, unspecified: Secondary | ICD-10-CM | POA: Diagnosis not present

## 2017-10-23 LAB — BASIC METABOLIC PANEL
Anion gap: 6 (ref 5–15)
BUN: 14 mg/dL (ref 6–20)
CO2: 26 mmol/L (ref 22–32)
Calcium: 8.5 mg/dL — ABNORMAL LOW (ref 8.9–10.3)
Chloride: 108 mmol/L (ref 101–111)
Creatinine, Ser: 0.64 mg/dL (ref 0.44–1.00)
GFR calc Af Amer: >60 mL/min (ref >60)
GFR calc non Af Amer: >60 mL/min (ref >60)
Glucose, Bld: 86 mg/dL (ref 65–99)
Potassium: 3.3 mmol/L — ABNORMAL LOW (ref 3.5–5.1)
Sodium: 140 mmol/L (ref 135–145)

## 2017-10-23 LAB — MAGNESIUM: Magnesium: 1.8 mg/dL (ref 1.7–2.4)

## 2017-10-23 LAB — COMPREHENSIVE METABOLIC PANEL
ALT: 21 U/L (ref 14–54)
AST: 27 U/L (ref 15–41)
Albumin: 4 g/dL (ref 3.5–5.0)
Alkaline Phosphatase: 94 U/L (ref 38–126)
Anion gap: 7 (ref 5–15)
BUN: 16 mg/dL (ref 6–20)
CO2: 28 mmol/L (ref 22–32)
Calcium: 9.3 mg/dL (ref 8.9–10.3)
Chloride: 103 mmol/L (ref 101–111)
Creatinine, Ser: 0.73 mg/dL (ref 0.44–1.00)
GFR calc Af Amer: >60 mL/min (ref >60)
GFR calc non Af Amer: >60 mL/min (ref >60)
Glucose, Bld: 112 mg/dL — ABNORMAL HIGH (ref 65–99)
Potassium: 2.9 mmol/L — ABNORMAL LOW (ref 3.5–5.1)
Sodium: 138 mmol/L (ref 135–145)
Total Bilirubin: 0.7 mg/dL (ref 0.3–1.2)
Total Protein: 7.9 g/dL (ref 6.5–8.1)

## 2017-10-23 LAB — CBC WITH DIFFERENTIAL/PLATELET
Basophils Absolute: 0 10*3/uL (ref 0.0–0.1)
Basophils Relative: 0 %
Eosinophils Absolute: 0 10*3/uL (ref 0.0–0.7)
Eosinophils Relative: 0 %
HCT: 37 % (ref 36.0–46.0)
Hemoglobin: 11.9 g/dL — ABNORMAL LOW (ref 12.0–15.0)
Lymphocytes Relative: 40 %
Lymphs Abs: 2.8 10*3/uL (ref 0.7–4.0)
MCH: 26.3 pg (ref 26.0–34.0)
MCHC: 32.2 g/dL (ref 30.0–36.0)
MCV: 81.9 fL (ref 78.0–100.0)
Monocytes Absolute: 0.4 10*3/uL (ref 0.1–1.0)
Monocytes Relative: 6 %
Neutro Abs: 3.9 10*3/uL (ref 1.7–7.7)
Neutrophils Relative %: 54 %
Platelets: 254 10*3/uL (ref 150–400)
RBC: 4.52 MIL/uL (ref 3.87–5.11)
RDW: 15.6 % — ABNORMAL HIGH (ref 11.5–15.5)
WBC: 7.1 10*3/uL (ref 4.0–10.5)

## 2017-10-23 LAB — OCCULT BLOOD X 1 CARD TO LAB, STOOL: Fecal Occult Bld: POSITIVE — AB

## 2017-10-23 LAB — LIPASE, BLOOD: Lipase: 19 U/L (ref 11–51)

## 2017-10-23 MED ORDER — MORPHINE SULFATE (PF) 2 MG/ML IV SOLN
2.0000 mg | Freq: Once | INTRAVENOUS | Status: AC
  Administered 2017-10-23: 2 mg via INTRAVENOUS
  Filled 2017-10-23: qty 1

## 2017-10-23 MED ORDER — POTASSIUM CHLORIDE 10 MEQ/100ML IV SOLN
10.0000 meq | INTRAVENOUS | Status: AC
  Administered 2017-10-23: 10 meq via INTRAVENOUS
  Filled 2017-10-23: qty 100

## 2017-10-23 MED ORDER — MORPHINE SULFATE (PF) 2 MG/ML IV SOLN
2.0000 mg | INTRAVENOUS | Status: DC | PRN
  Administered 2017-10-23 – 2017-10-25 (×2): 2 mg via INTRAVENOUS
  Filled 2017-10-23 (×2): qty 1

## 2017-10-23 MED ORDER — VITAMIN D 50 MCG (2000 UT) PO CAPS: 2000.0000 [IU] | ORAL_CAPSULE | Freq: Every day | ORAL | Status: DC

## 2017-10-23 MED ORDER — ONDANSETRON HCL 4 MG/2ML IJ SOLN
4.0000 mg | Freq: Four times a day (QID) | INTRAMUSCULAR | Status: DC | PRN
  Filled 2017-10-23: qty 2

## 2017-10-23 MED ORDER — ONDANSETRON HCL 4 MG/2ML IJ SOLN
4.0000 mg | Freq: Once | INTRAMUSCULAR | Status: AC
  Administered 2017-10-23: 4 mg via INTRAVENOUS
  Filled 2017-10-23: qty 2

## 2017-10-23 MED ORDER — POTASSIUM CHLORIDE 10 MEQ/100ML IV SOLN
10.0000 meq | Freq: Once | INTRAVENOUS | Status: AC
  Administered 2017-10-23: 10 meq via INTRAVENOUS
  Filled 2017-10-23: qty 100

## 2017-10-23 MED ORDER — SODIUM CHLORIDE 0.9 % IV SOLN
INTRAVENOUS | Status: DC
  Administered 2017-10-23 – 2017-10-24 (×3): via INTRAVENOUS

## 2017-10-23 MED ORDER — PANTOPRAZOLE SODIUM 40 MG IV SOLR
40.0000 mg | Freq: Two times a day (BID) | INTRAVENOUS | Status: DC
  Administered 2017-10-23 – 2017-10-24 (×3): 40 mg via INTRAVENOUS
  Filled 2017-10-23 (×3): qty 40

## 2017-10-23 MED ORDER — VITAMIN D 1000 UNITS PO TABS
2000.0000 [IU] | ORAL_TABLET | Freq: Every day | ORAL | Status: DC
  Administered 2017-10-24 – 2017-10-25 (×2): 2000 [IU] via ORAL
  Filled 2017-10-23 (×2): qty 2

## 2017-10-23 MED ORDER — POTASSIUM CHLORIDE CRYS ER 20 MEQ PO TBCR
40.0000 meq | EXTENDED_RELEASE_TABLET | ORAL | Status: AC
  Administered 2017-10-23 (×2): 40 meq via ORAL
  Filled 2017-10-23 (×2): qty 2

## 2017-10-23 MED ORDER — SODIUM CHLORIDE 0.9 % IV BOLUS (SEPSIS)
500.0000 mL | Freq: Once | INTRAVENOUS | Status: AC
  Administered 2017-10-23: 500 mL via INTRAVENOUS

## 2017-10-23 MED ORDER — LISINOPRIL 10 MG PO TABS
10.0000 mg | ORAL_TABLET | Freq: Every day | ORAL | Status: DC
  Administered 2017-10-24 – 2017-10-25 (×2): 10 mg via ORAL
  Filled 2017-10-23 (×2): qty 1

## 2017-10-23 NOTE — ED Triage Notes (Signed)
Rectal bleeding since MN. Bright red. Recent constipation. States last night she ate and vomiting x 2 afterward and had a diarrhea stool.

## 2017-10-23 NOTE — ED Notes (Signed)
[facility] arrived to transport pt 

## 2017-10-23 NOTE — ED Provider Notes (Signed)
[facility] EMERGENCY DEPARTMENT Provider Note   CSN: [id number] Arrival date & time: 10/23/17  1103     History   Chief Complaint Chief Complaint  Patient presents with  . Rectal Bleeding    HPI Margaret [name] is a 66 y.o. female presenting with rectal bleeding.  Pt states she had abdominal cramping and nausea last night.  She used the bathroom, and had multiple episodes of diarrhea.  Since then, she has had frequent bright red bloody diarrhea. She denies history of similar.  She has associated nausea, vomited twice yesterday.  She has not had anything to eat today.  She reports generalized abdominal cramping, which is intermittent.  It is not associated with bowel movements, she states it is worse when she is standing up.  She sees Dr. Cristina Gong with [facility] gastroenterology.  She had a normal EGD and colonoscopy last year.  Abdominal surgeries include a hysterectomy.  No other abdominal surgeries.  She denies fevers, chills, chest pain, shortness of breath, or urinary symptoms. She denies dizziness or weakness. She is not on blood thinners. She is still having bloody stools.  HPI  Past Medical History:  Diagnosis Date  . Allergy   . Anemia   . Dyspareunia   . Endometriosis   . High cholesterol   . Hx of abnormal Pap smear 1980's  . Hypertension   . Osteopenia   . Ulcer   . Urinary incontinence     Patient Active Problem List   Diagnosis Date Noted  . Back pain 04/12/2014  . Arthritis 04/12/2014  . Knee pain 04/12/2014  . Unspecified essential hypertension 11/27/2013  . Osteoarthritis of left knee 07/16/2012    Past Surgical History:  Procedure Laterality Date  . ABDOMINAL HYSTERECTOMY  09/2008   TVH/BSO--Dr. Quincy Simmonds with TVT  . BLADDER SUSPENSION  09/2008   Dr. Quincy Simmonds  . CERVIX LESION DESTRUCTION  1980   hx abnormal pap--dysplasia    OB History    Gravida Para Term Preterm AB Living   3 3 3     3    SAB TAB Ectopic Multiple Live Births            Home Medications    Prior to Admission medications   Medication Sig Start Date End Date Taking? Authorizing Provider  aspirin 81 MG tablet Take 162 mg by mouth daily.    [provider]  Cholecalciferol (VITAMIN D) 2000 UNITS CAPS Take 2,000 Units by mouth daily.     [provider]  Ferrous Sulfate (IRON) 325 (65 Fe) MG TABS Take by mouth daily.    [provider]  fexofenadine (ALLEGRA) 180 MG tablet Take 180 mg by mouth daily.    [provider]  fluticasone (FLONASE) 50 MCG/ACT nasal spray Place 1 spray into both nostrils daily as needed for allergies or rhinitis.     [provider]  HYDROcodone-acetaminophen (NORCO) 5-325 MG tablet Take 1 tablet by mouth every 6 (six) hours as needed. 10/15/17   Julianne Rice, MD  ibuprofen (ADVIL,MOTRIN) 200 MG tablet Take 400 mg by mouth every 6 (six) hours as needed for headache, mild pain or moderate pain.    [provider]  lisinopril-hydrochlorothiazide (PRINZIDE,ZESTORETIC) 10-12.5 MG per tablet Take 1 tablet by mouth daily. 05/05/15   [provider]  Multiple Vitamins-Minerals (MULTIVITAMIN ADULT PO) Take 1 tablet by mouth daily.    [provider]  TURMERIC PO Take by mouth daily.    [provider]  Family History Family History  Problem Relation Age of Onset  . Diabetes Mother   . Hypertension Mother   . Thyroid disease Sister   . Seizures Brother   . Diabetes Brother   . Hypertension Brother   . Diabetes Brother   . Arthritis Unknown   . Diabetes Unknown     Social History Social History   Tobacco Use  . Smoking status: Never Smoker  . Smokeless tobacco: Never Used  Substance Use Topics  . Alcohol use: Yes    Comment: occasional  . Drug use: No     Allergies   Ciprofloxacin and Sulfa antibiotics   Review of Systems Review of Systems  Constitutional: Negative for chills and fever.  Eyes: Negative for visual  disturbance.  Respiratory: Negative for chest tightness and shortness of breath.   Cardiovascular: Negative for chest pain.  Gastrointestinal: Positive for abdominal pain, blood in stool, diarrhea, nausea and vomiting.  Genitourinary: Negative for dysuria, frequency and hematuria.  Musculoskeletal: Negative for back pain.  Skin: Negative for wound.  Neurological: Negative for dizziness and weakness.  Hematological: Does not bruise/bleed easily.  Psychiatric/Behavioral: Negative for confusion.     Physical Exam Updated Vital Signs BP 116/72   Pulse 73   Temp 98.1 F (36.7 C) (Oral)   Resp 17   Ht 5\' 6"  (1.676 m)   Wt 79.8 kg (176 lb)   SpO2 100%   BMI 28.41 kg/m   Physical Exam  Constitutional: She is oriented to person, place, and time. She appears well-developed and well-nourished. No distress.  HENT:  Head: Normocephalic and atraumatic.  Eyes: Conjunctivae and EOM are normal. Pupils are equal, round, and reactive to light.  Neck: Normal range of motion. Neck supple.  Cardiovascular: Normal rate, regular rhythm and intact distal pulses.  Pulmonary/Chest: Effort normal and breath sounds normal. No respiratory distress. She has no wheezes.  Abdominal: Soft. Bowel sounds are normal. She exhibits no distension and no mass. There is no tenderness. There is no guarding.  No focal TTP of the abd. No rebound or guarding.   Genitourinary: Rectum normal. Rectal exam shows no external hemorrhoid, no internal hemorrhoid and no mass.  Genitourinary Comments: Chaperone present. No visible blood on exam. No masses or lesions noted.   Musculoskeletal: Normal range of motion.  Neurological: She is alert and oriented to person, place, and time.  Skin: Skin is warm and dry.  Psychiatric: She has a normal mood and affect.  Nursing note and vitals reviewed.    ED Treatments / Results  Labs (all labs ordered are listed, but only abnormal results are displayed) Labs Reviewed  CBC WITH  DIFFERENTIAL/PLATELET - Abnormal; Notable for the following components:      Result Value   Hemoglobin 11.9 (*)    RDW 15.6 (*)    All other components within normal limits  COMPREHENSIVE METABOLIC PANEL - Abnormal; Notable for the following components:   Potassium 2.9 (*)    Glucose, Bld 112 (*)    All other components within normal limits  OCCULT BLOOD X 1 CARD TO LAB, STOOL - Abnormal; Notable for the following components:   Fecal Occult Bld POSITIVE (*)    All other components within normal limits  LIPASE, BLOOD  URINALYSIS, ROUTINE W REFLEX MICROSCOPIC    EKG  EKG Interpretation None       Radiology No results found.  Procedures Procedures (including critical care time)  Medications Ordered in ED Medications  sodium chloride 0.9 %  bolus 500 mL (500 mLs Intravenous New Bag/Given 10/23/17 1201)  ondansetron (ZOFRAN) injection 4 mg (4 mg Intravenous Given 10/23/17 1204)  morphine 2 MG/ML injection 2 mg (2 mg Intravenous Given 10/23/17 1204)     Initial Impression / Assessment and Plan / ED Course  I have reviewed the triage vital signs and the nursing notes.  Pertinent labs & imaging results that were available during my care of the patient were reviewed by me and considered in my medical decision making (see chart for details).     Patient presenting for evaluation of rectal bleeding.  She has intermittent cramping.  Physical exam reassuring, she is not tachycardic, hypotensive, and appears nontoxic.  Abdominal exam is reassuring, without guarding, rigidity, or distention.  She does not of appear pale.  Will obtain basic abdominal labs, Hemoccult, and give IV fluids, Zofran, and morphine for symptom control.  Case discussed with attending, and Dr. Thomasene Lot evaluated the pt.    Labs show stable Hgb, low K. Will start K via IV. Vitals remain reassuring. Hemoccult positive. On reassessment, pt reports sxs are improved. Will consult with hospitalist for admission and [facility] for consult. Pt would like to go POV with a friend driving instead of via [place]. Discussed risks of going POV and importance of going straight to [facility] without stops.  Discussed with hospitalist, pt pt be admitted to [facility]. GI has not returned the consult call. Pt decided to be transported via ambulance service instead of POV. 2nd run of K started. Pt reports pain is returning, morphine given.    Final Clinical Impressions(s) / ED Diagnoses   Final diagnoses:  Rectal bleeding  Hypokalemia    ED Discharge Orders    None       Franchot Heidelberg, PA-C 10/23/17 1713    Macarthur Critchley, MD 10/28/17 1127

## 2017-10-23 NOTE — H&P (Addendum)
History and Physical    Margaret [name] VHQ:[id number] DOB: 1950/11/26 DOA: 10/23/2017  PCP: Cari Caraway, MD   Patient coming from: Direct admission from ED at [facility]    Chief Complaint: Bright red blood per rectum.  HPI: Patient is a 66 year old female with past medical history of hypertension, chronic back pain , Seasonal allergies who presents to the emergency department in [facility] at [facility] with complaints of multiple episodes of bright red blood per rectum.  Patient was sent for direct admission to here. Patient is stated that last night after she took her dinner, she started having loose stools.  Patient was also nauseated and started to throw up.  The first few diarrheal episodes were loose stools containing food particles which were later followed by just bright blood.  She had about 5-6 episodes of bright red blood per rectum, small volumes, no clots.  Every time she got up, she had an urge  stool defecate which was just blood.Patient was also having crampy abdominal pain.  Patient then presented to the emergency department at [facility]. Stool occult blood was found to be positive. Till my evaluation, patient had about 8-10 episodes.  Patient reported of taking ibuprofen occasionally for her back pain.  Her last intake was past weekend.  She denies taking any aspirin recently.  She denies any history of GI bleed in the past but said she was told to have a gastric ulcer about 25 years ago.  Her last EGD and colonoscopy was about a year ago by Dr Cristina Gong.  She had some reflux/stomach issues.  Colonoscopy just showed few polyps. Patient's hemoglobin was found to be 11.9 on presentation and there is no active bleeding at present.  Her nausea and vomiting has improved.  Still complains of mild abdominal pain which is generalized.  Patient also found to have severe hypokalemia on presentation and is being supplemented with potassium. He denies any chest pain, shortness of  breath, dysuria, fever, chills or headache.  ED Course: Patient was given potassium supplements, started on IV fluids.  Review of Systems: As per HPI otherwise 10 point review of systems negative.    Past Medical History:  Diagnosis Date  . Allergy   . Anemia   . Dyspareunia   . Endometriosis   . High cholesterol   . Hx of abnormal Pap smear 1980's  . Hypertension   . Osteopenia   . Ulcer   . Urinary incontinence     Past Surgical History:  Procedure Laterality Date  . ABDOMINAL HYSTERECTOMY  09/2008   TVH/BSO--Dr. Quincy Simmonds with TVT  . BLADDER SUSPENSION  09/2008   Dr. Quincy Simmonds  . CERVIX LESION DESTRUCTION  1980   hx abnormal pap--dysplasia   Social History: Has never smoked.  Drinks wine occasionally.  Allergies  Allergen Reactions  . Ciprofloxacin Nausea And Vomiting  . Sulfa Antibiotics     Unknown    Family History  Problem Relation Age of Onset  . Diabetes Mother   . Hypertension Mother   . Thyroid disease Sister   . Seizures Brother   . Diabetes Brother   . Hypertension Brother   . Diabetes Brother   . Arthritis Unknown   . Diabetes Unknown      Prior to Admission medications   Medication Sig Start Date End Date Taking? Authorizing Provider  fexofenadine (ALLEGRA) 180 MG tablet Take 180 mg by mouth daily.   Yes [provider]  HYDROcodone-acetaminophen (NORCO) 5-325 MG tablet Take  1 tablet by mouth every 6 (six) hours as needed. 10/15/17  Yes Julianne Rice, MD  ibuprofen (ADVIL,MOTRIN) 200 MG tablet Take 400 mg by mouth every 6 (six) hours as needed for headache, mild pain or moderate pain.   Yes [provider]  lisinopril-hydrochlorothiazide (PRINZIDE,ZESTORETIC) 10-12.5 MG per tablet Take 1 tablet by mouth daily. 05/05/15  Yes [provider]  aspirin 81 MG tablet Take 162 mg by mouth daily.    [provider]  Cholecalciferol (VITAMIN D) 2000 UNITS CAPS Take 2,000 Units by mouth daily.     [provider]    Ferrous Sulfate (IRON) 325 (65 Fe) MG TABS Take by mouth daily.    [provider]  fluticasone (FLONASE) 50 MCG/ACT nasal spray Place 1 spray into both nostrils daily as needed for allergies or rhinitis.     [provider]  Multiple Vitamins-Minerals (MULTIVITAMIN ADULT PO) Take 1 tablet by mouth daily.    [provider]  TURMERIC PO Take by mouth daily.    [provider]    Physical Exam: Vitals:   10/23/17 1400 10/23/17 1415 10/23/17 1430 10/23/17 1545  BP: (!) 141/76 130/70 135/74 125/77  Pulse:  71 72 70  Resp: 15 13 17 16   Temp:    99 F (37.2 C)  TempSrc:    Oral  SpO2: 97% 98% 96% 100%  Weight:      Height:        Constitutional: NAD, calm, comfortable Vitals:   10/23/17 1400 10/23/17 1415 10/23/17 1430 10/23/17 1545  BP: (!) 141/76 130/70 135/74 125/77  Pulse:  71 72 70  Resp: 15 13 17 16   Temp:    99 F (37.2 C)  TempSrc:    Oral  SpO2: 97% 98% 96% 100%  Weight:      Height:       Eyes: PERRL, lids and conjunctivae normal ENMT: Mucous membranes are moist. Posterior pharynx clear of any exudate or lesions.Normal dentition.  Neck: normal, supple, no masses, no thyromegaly Respiratory: clear to auscultation bilaterally, no wheezing, no crackles. Normal respiratory effort. No accessory muscle use.  Cardiovascular: Regular rate and rhythm, no murmurs / rubs / gallops. No extremity edema. 2+ pedal pulses. No carotid bruits.  Abdomen: no tenderness, no masses palpated. No hepatosplenomegaly. Bowel sounds positive.  Musculoskeletal: no clubbing / cyanosis. No joint deformity upper and lower extremities. Good ROM, no contractures. Normal muscle tone.  Skin: no rashes, lesions, ulcers. No induration Neurologic: CN 2-12 grossly intact. Sensation intact, DTR normal. Strength 5/5 in all 4.  Psychiatric: Normal judgment and insight. Alert and oriented x 3. Normal mood.    Labs on Admission: I have personally reviewed following labs and  imaging studies  CBC: Recent Labs  Lab 10/23/17 1145  WBC 7.1  NEUTROABS 3.9  HGB 11.9*  HCT 37.0  MCV 81.9  PLT 253   Basic Metabolic Panel: Recent Labs  Lab 10/23/17 1145  NA 138  K 2.9*  CL 103  CO2 28  GLUCOSE 112*  BUN 16  CREATININE 0.73  CALCIUM 9.3   GFR: Estimated Creatinine Clearance: 75.7 mL/min (by C-G formula based on SCr of 0.73 mg/dL). Liver Function Tests: Recent Labs  Lab 10/23/17 1145  AST 27  ALT 21  ALKPHOS 94  BILITOT 0.7  PROT 7.9  ALBUMIN 4.0   Recent Labs  Lab 10/23/17 1145  LIPASE 19   No results for input(s): AMMONIA in the last 168 hours. Coagulation Profile: No  results for input(s): INR, PROTIME in the last 168 hours. Cardiac Enzymes: No results for input(s): CKTOTAL, CKMB, CKMBINDEX, TROPONINI in the last 168 hours. BNP (last 3 results) No results for input(s): PROBNP in the last 8760 hours. HbA1C: No results for input(s): HGBA1C in the last 72 hours. CBG: No results for input(s): GLUCAP in the last 168 hours. Lipid Profile: No results for input(s): CHOL, HDL, LDLCALC, TRIG, CHOLHDL, LDLDIRECT in the last 72 hours. Thyroid Function Tests: No results for input(s): TSH, T4TOTAL, FREET4, T3FREE, THYROIDAB in the last 72 hours. Anemia Panel: No results for input(s): VITAMINB12, FOLATE, FERRITIN, TIBC, IRON, RETICCTPCT in the last 72 hours. Urine analysis:    Component Value Date/Time   COLORURINE YELLOW 08/22/2015 1537   APPEARANCEUR CLEAR 08/22/2015 1537   LABSPEC 1.015 08/22/2015 1537   PHURINE 5.5 08/22/2015 1537   GLUCOSEU NEGATIVE 08/22/2015 1537   HGBUR NEGATIVE 08/22/2015 1537   BILIRUBINUR neg 06/11/2017 1419   KETONESUR NEGATIVE 08/22/2015 1537   PROTEINUR neg 06/11/2017 1419   PROTEINUR NEGATIVE 08/22/2015 1537   UROBILINOGEN 0.2 06/11/2017 1419   UROBILINOGEN 1.0 08/22/2015 1537   NITRITE neg 06/11/2017 1419   NITRITE NEGATIVE 08/22/2015 1537   LEUKOCYTESUR Negative 06/11/2017 1419    Radiological  Exams on Admission: No results found.  EKG: Independently reviewed.  Did not show any ST changes.  Assessment/Plan Principal Problem:   GI bleed Active Problems:   Essential hypertension   Back pain   Hypokalemia   Nausea vomiting and diarrhea   Hematochezia    Patient is a 66 year old female with the above listed past medical history came to the emergency department at [facility] with complaints of bright red blood per rectum.  Patient was sent for direct admission here.  Following problems are being addressed during hospitalization:  GI bleed: Came with hematochezia.  Initial hemoglobin is stable at 11.9.  No active bleeding at present.  We will continue to monitor H&H and transfuse if necessary. GI has been consulted.  She will be started on clear liquid diet.  N.p.o. after midnight for possible EGD/endoscopy tomorrow.  Hypokalemia: Likely secondary to nausea, vomiting and diarrhea.  Potassium is being supplemented.  Will check the levels in the morning.  Will check magnesium level also.  Nausea, vomiting and diarrhea: Resolved.    Patient is afebrile and her low white cell counts are stable.  Rule out gastroenteritis/colitis.  Patient will be started on IV Protonix and Zofran.  Will start on IV fluids.  Hypertension: Currently blood pressure stable.  On lisinopril and hydrochlorthiazide at home.  We will continue just lisinopril for now.  We will continue to monitor blood pressure.  Back pain: Continue supportive care.   Severity of Illness:   I certify that at the point of admission it is my clinical judgment that the patient will require inpatient hospital care spanning beyond 2 midnights from the point of admission due to high intensity of service, high risk for further deterioration and high frequency of surveillance required.    DVT prophylaxis: SCD Code Status: Full Family Communication: No family members were present on the bedside  consults called:  Gastroenterology.  Case discussed with Dr.Parag    Marene Lenz MD [facility] Pager [phone]  If 7PM-7AM, please contact night-coverage [email or web address] Password [facility]  10/23/2017, 4:56 PM

## 2017-10-24 DIAGNOSIS — R197 Diarrhea, unspecified: Secondary | ICD-10-CM

## 2017-10-24 DIAGNOSIS — R112 Nausea with vomiting, unspecified: Secondary | ICD-10-CM

## 2017-10-24 DIAGNOSIS — E876 Hypokalemia: Secondary | ICD-10-CM

## 2017-10-24 DIAGNOSIS — I10 Essential (primary) hypertension: Secondary | ICD-10-CM

## 2017-10-24 DIAGNOSIS — K625 Hemorrhage of anus and rectum: Secondary | ICD-10-CM

## 2017-10-24 LAB — CBC
HCT: 32.7 % — ABNORMAL LOW (ref 36.0–46.0)
Hemoglobin: 10.1 g/dL — ABNORMAL LOW (ref 12.0–15.0)
MCH: 26 pg (ref 26.0–34.0)
MCHC: 30.9 g/dL (ref 30.0–36.0)
MCV: 84.1 fL (ref 78.0–100.0)
Platelets: 206 10*3/uL (ref 150–400)
RBC: 3.89 MIL/uL (ref 3.87–5.11)
RDW: 16 % — ABNORMAL HIGH (ref 11.5–15.5)
WBC: 6.9 10*3/uL (ref 4.0–10.5)

## 2017-10-24 LAB — URINALYSIS, ROUTINE W REFLEX MICROSCOPIC
Bacteria, UA: NONE SEEN
Bilirubin Urine: NEGATIVE
Glucose, UA: NEGATIVE mg/dL
Ketones, ur: NEGATIVE mg/dL
Leukocytes, UA: NEGATIVE
Nitrite: NEGATIVE
Protein, ur: NEGATIVE mg/dL
Specific Gravity, Urine: 1.02 (ref 1.005–1.030)
pH: 5 (ref 5.0–8.0)

## 2017-10-24 LAB — BASIC METABOLIC PANEL
Anion gap: 2 — ABNORMAL LOW (ref 5–15)
BUN: 10 mg/dL (ref 6–20)
CO2: 27 mmol/L (ref 22–32)
Calcium: 8.6 mg/dL — ABNORMAL LOW (ref 8.9–10.3)
Chloride: 111 mmol/L (ref 101–111)
Creatinine, Ser: 0.65 mg/dL (ref 0.44–1.00)
GFR calc Af Amer: >60 mL/min (ref >60)
GFR calc non Af Amer: >60 mL/min (ref >60)
Glucose, Bld: 98 mg/dL (ref 65–99)
Potassium: 4.4 mmol/L (ref 3.5–5.1)
Sodium: 140 mmol/L (ref 135–145)

## 2017-10-24 MED ORDER — PEG 3350-KCL-NA BICARB-NACL 420 G PO SOLR: 4000.0000 mL | Freq: Once | ORAL | Status: DC

## 2017-10-24 MED ORDER — PEG 3350-KCL-NABCB-NACL-NASULF 236 G PO SOLR
2000.0000 mL | Freq: Once | ORAL | Status: AC
  Administered 2017-10-24: 2000 mL via ORAL
  Filled 2017-10-24: qty 4000

## 2017-10-24 MED ORDER — PEG 3350-KCL-NABCB-NACL-NASULF 236 G PO SOLR: 2000.0000 mL | Freq: Once | ORAL | Status: DC

## 2017-10-24 MED ORDER — PEG 3350-KCL-NABCB-NACL-NASULF 236 G PO SOLR
2000.0000 mL | Freq: Once | ORAL | Status: DC
  Filled 2017-10-24: qty 4000

## 2017-10-24 MED ORDER — ACETAMINOPHEN 325 MG PO TABS
650.0000 mg | ORAL_TABLET | Freq: Four times a day (QID) | ORAL | Status: DC | PRN
  Administered 2017-10-24 – 2017-10-25 (×3): 650 mg via ORAL
  Filled 2017-10-24 (×3): qty 2

## 2017-10-24 NOTE — Consult Note (Signed)
[facility] Consult  Referring Provider: Marene Lenz, MD Primary Care Physician:  Cari Caraway, MD Primary Gastroenterologist: Dr.Buccini  Reason for Consultation:  Rectal bleeding and abdominal pain  HPI: Margaret [name] is a 66 y.o. Caucasian female was in her usual state of health until 2 days ago. Wednesday evening after eating [place] sausage she experienced nausea, vomiting and diarrhea. Initially stools were loose to watery, eventually patient started noticing bright red blood per rectum. Patient also developed cramps more generalized but also in lower abdomen and continued to have small amount of rectal bleeding. She called her primary gastroenterologist recommended that patient be evaluated in the ER. She was seen at [facility] and then transferred to [facility] for further evaluation. Patient states last episode of rectal bleeding was yesterday evening, she hasn't had a bowel movement since. Normally patient has a bowel movement every day or every 2-3 days. Last colonoscopy was in 2017, she was noted to have sigmoid diverticulosis and a hyperplastic polyp was removed from ascending colon. Colonoscopy from 2007 was completely unremarkable. EGD from 2017 showed a 1 cm hiatal hernia otherwise was normal. Patient has not experienced acid reflux, regurgitation, difficulty swallowing or pain on swallowing. Denies fever or chills or rigors. She denies unintentional weight loss, loss of appetite, early satiety or bloating. There is no family history of colon cancer. She is not in any blood thinners.   Past Medical History:  Diagnosis Date  . Allergy   . Anemia   . Dyspareunia   . Endometriosis   . High cholesterol   . Hx of abnormal Pap smear 1980's  . Hypertension   . Osteopenia   . Ulcer   . Urinary incontinence     Past Surgical History:  Procedure Laterality Date  . ABDOMINAL HYSTERECTOMY  09/2008   TVH/BSO--Dr. Quincy Simmonds with TVT  .  BLADDER SUSPENSION  09/2008   Dr. Quincy Simmonds  . CERVIX LESION DESTRUCTION  1980   hx abnormal pap--dysplasia    Prior to Admission medications   Medication Sig Start Date End Date Taking? Authorizing Provider  fexofenadine (ALLEGRA) 180 MG tablet Take 180 mg by mouth daily.   Yes [provider]  HYDROcodone-acetaminophen (NORCO) 5-325 MG tablet Take 1 tablet by mouth every 6 (six) hours as needed. 10/15/17  Yes Julianne Rice, MD  ibuprofen (ADVIL,MOTRIN) 200 MG tablet Take 400 mg by mouth every 6 (six) hours as needed for headache, mild pain or moderate pain.   Yes [provider]  lisinopril-hydrochlorothiazide (PRINZIDE,ZESTORETIC) 10-12.5 MG per tablet Take 1 tablet by mouth daily. 05/05/15  Yes [provider]  naproxen sodium (ALEVE) 220 MG tablet Take 440 mg by mouth daily as needed (pain).   Yes [provider]  OVER THE COUNTER MEDICATION Apply 1 application topically daily as needed (knee pain). MAGNESIUM OIL APPLIED TO KNEE FOR ARTHRITIS PAIN   Yes [provider]  aspirin 81 MG tablet Take 162 mg by mouth daily.    [provider]  Cholecalciferol (VITAMIN D) 2000 UNITS CAPS Take 2,000 Units by mouth daily.     [provider]  Ferrous Sulfate (IRON) 325 (65 Fe) MG TABS Take by mouth daily.    [provider]  fluticasone (FLONASE) 50 MCG/ACT nasal spray Place 1 spray into both nostrils daily as needed for allergies or rhinitis.     [provider]  Multiple Vitamins-Minerals (MULTIVITAMIN ADULT PO) Take 1 tablet by mouth daily.    [provider]  TURMERIC PO Take by mouth daily.    [provider]    Current Facility-Administered Medications  Medication Dose Route Frequency Provider Last Rate Last Dose  . 0.9 %  sodium chloride infusion   Intravenous Continuous Marene Lenz, MD 75 mL/hr at 10/23/17 2034    . acetaminophen (TYLENOL) tablet 650 mg  650 mg Oral Q6H PRN Schorr,  Rhetta Mura, NP   650 mg at 10/24/17 0033  . cholecalciferol (VITAMIN D) tablet 2,000 Units  2,000 Units Oral Daily Marene Lenz, MD   2,000 Units at 10/24/17 1020  . lisinopril (PRINIVIL,ZESTRIL) tablet 10 mg  10 mg Oral Daily Jodie Echevaria, Amrit, MD   10 mg at 10/24/17 1020  . morphine 2 MG/ML injection 2 mg  2 mg Intravenous Q4H PRN Marene Lenz, MD   2 mg at 10/23/17 2030  . ondansetron (ZOFRAN) injection 4 mg  4 mg Intravenous Q6H PRN Adhikari Bk, Amrit, MD      . pantoprazole (PROTONIX) injection 40 mg  40 mg Intravenous Q12H Adhikari Bk, Amrit, MD   40 mg at 10/24/17 1020  . polyethylene glycol-electrolytes (NuLYTELY/GoLYTELY) solution 4,000 mL  4,000 mL Oral Once Ronnette Juniper, MD        Allergies as of 10/23/2017 - Review Complete 10/23/2017  Allergen Reaction Noted  . Ciprofloxacin Nausea And Vomiting 07/16/2012  . Sulfa antibiotics  07/16/2012    Family History  Problem Relation Age of Onset  . Diabetes Mother   . Hypertension Mother   . Thyroid disease Sister   . Seizures Brother   . Diabetes Brother   . Hypertension Brother   . Diabetes Brother   . Arthritis Unknown   . Diabetes Unknown     Social History   Socioeconomic History  . Marital status: Divorced    Spouse name: Not on file  . Number of children: Not on file  . Years of education: college  . Highest education level: Not on file  Social Needs  . Financial resource strain: Not on file  . Food insecurity - worry: Not on file  . Food insecurity - inability: Not on file  . Transportation needs - medical: Not on file  . Transportation needs - non-medical: Not on file  Occupational History  . Not on file  Tobacco Use  . Smoking status: Never Smoker  . Smokeless tobacco: Never Used  Substance and Sexual Activity  . Alcohol use: Yes    Comment: occasional  . Drug use: No  . Sexual activity: No    Birth control/protection: Surgical    Comment: TAH/BSO  Other Topics Concern  . Not on file   Social History Narrative  . Not on file    Review of Systems: Positive for GI: Described in detail in HPI.    Gen: Denies any fever, chills, rigors, night sweats, anorexia, fatigue, weakness, malaise, involuntary weight loss, and sleep disorder CV: Denies chest pain, angina, palpitations, syncope, orthopnea, PND, peripheral edema, and claudication. Resp: Denies dyspnea, cough, sputum, wheezing, coughing up blood. GU : Denies urinary burning, blood in urine, urinary frequency, urinary hesitancy, nocturnal urination, and urinary incontinence. MS: Denies joint pain or swelling.  Denies muscle weakness, cramps, atrophy.  Derm: Denies rash, itching, oral ulcerations, hives, unhealing ulcers.  Psych: Denies depression, anxiety, memory loss, suicidal ideation, hallucinations,  and confusion. Heme: bleeding,Denies bruising,  and enlarged lymph nodes. Neuro:  Denies any headaches, dizziness, paresthesias. Endo:  Denies any problems with DM, thyroid, adrenal function.  Physical Exam: Vital signs in last 24 hours: Temp:  [97.7 F (36.5 C)-99.1 F (37.3 C)] 98.4 F (36.9 C) (12/14 0554) Pulse Rate:  [69-72] 70 (12/14 0554) Resp:  [13-18] 18 (12/14 0554) BP: (114-141)/(70-77) 114/72 (12/14 0554) SpO2:  [96 %-100 %] 100 % (12/14 0554) Last BM Date: 10/22/17  General:   Alert,  Well-developed, well-nourished, pleasant and cooperative in NAD Head:  Normocephalic and atraumatic. Eyes:  Sclera clear, no icterus.   Mild pallor Ears:  Normal auditory acuity. Nose:  No deformity, discharge,  or lesions. Mouth:  No deformity or lesions.  Oropharynx pink & moist. Neck:  Supple; no masses or thyromegaly. Lungs:  Clear throughout to auscultation.   No wheezes, crackles, or rhonchi. No acute distress. Heart:  Regular rate and rhythm; no murmurs, clicks, rubs,  or gallops. Extremities:  Without clubbing or edema. Neurologic:  Alert and  oriented x4;  grossly normal neurologically. Skin:  Intact  without significant lesions or rashes. Psych:  Alert and cooperative. Normal mood and affect. Abdomen:  Soft, nontender and nondistended. No masses, hepatosplenomegaly or hernias noted. Normal bowel sounds, without guarding, and without rebound.         Lab Results: Recent Labs    10/23/17 1145 10/24/17 0436  WBC 7.1 6.9  HGB 11.9* 10.1*  HCT 37.0 32.7*  PLT 254 206   BMET Recent Labs    10/23/17 1145 10/23/17 1630 10/24/17 0436  NA 138 140 140  K 2.9* 3.3* 4.4  CL 103 108 111  CO2 28 26 27   GLUCOSE 112* 86 98  BUN 16 14 10   CREATININE 0.73 0.64 0.65  CALCIUM 9.3 8.5* 8.6*   LFT Recent Labs    10/23/17 1145  PROT 7.9  ALBUMIN 4.0  AST 27  ALT 21  ALKPHOS 94  BILITOT 0.7   PT/INR No results for input(s): LABPROT, INR in the last 72 hours.  Studies/Results: No results found.  Impression: 1. Rectal bleeding 2. Nausea, vomiting,diarrhea with abdominal pain, normal WBC count 3. Anemia hemoglobin 11.9 on admission 10.1 today, normocytic normal platelets 4. Hypokalemia at 2.9 on admission, resolved 4.4 today 5. Pancreatic divisum noted on CAT scan from 09/02/16   Plan: Differential diagnosis includes ischemic colitis, less likely diverticular bleeding as patient had abdominal pain. Patient remains hemodynamically stable and is agreeable for colonoscopy. Continue IV fluids at 75 mL an hour, start patient on clear liquid diet and nothing by mouth post midnight. Agreeable to take a bowel prep, will be given split dose, monitor H&H and transfuse if hemoglobin less than 7.   LOS: 1 day   Ronnette Juniper, M.D.  10/24/2017, 1:31 PM  Pager [phone] If no answer or after 5 PM call [phone]

## 2017-10-24 NOTE — H&P (View-Only) (Signed)
[facility] Consult  Referring Provider: Marene Lenz, MD Primary Care Physician:  Cari Caraway, MD Primary Gastroenterologist: Dr.Buccini  Reason for Consultation:  Rectal bleeding and abdominal pain  HPI: Margaret [name] is a 66 y.o. Caucasian female was in her usual state of health until 2 days ago. Wednesday evening after eating [place] sausage she experienced nausea, vomiting and diarrhea. Initially stools were loose to watery, eventually patient started noticing bright red blood per rectum. Patient also developed cramps more generalized but also in lower abdomen and continued to have small amount of rectal bleeding. She called her primary gastroenterologist recommended that patient be evaluated in the ER. She was seen at [facility] and then transferred to [facility] for further evaluation. Patient states last episode of rectal bleeding was yesterday evening, she hasn't had a bowel movement since. Normally patient has a bowel movement every day or every 2-3 days. Last colonoscopy was in 2017, she was noted to have sigmoid diverticulosis and a hyperplastic polyp was removed from ascending colon. Colonoscopy from 2007 was completely unremarkable. EGD from 2017 showed a 1 cm hiatal hernia otherwise was normal. Patient has not experienced acid reflux, regurgitation, difficulty swallowing or pain on swallowing. Denies fever or chills or rigors. She denies unintentional weight loss, loss of appetite, early satiety or bloating. There is no family history of colon cancer. She is not in any blood thinners.   Past Medical History:  Diagnosis Date  . Allergy   . Anemia   . Dyspareunia   . Endometriosis   . High cholesterol   . Hx of abnormal Pap smear 1980's  . Hypertension   . Osteopenia   . Ulcer   . Urinary incontinence     Past Surgical History:  Procedure Laterality Date  . ABDOMINAL HYSTERECTOMY  09/2008   TVH/BSO--Dr. Quincy Simmonds with TVT  .  BLADDER SUSPENSION  09/2008   Dr. Quincy Simmonds  . CERVIX LESION DESTRUCTION  1980   hx abnormal pap--dysplasia    Prior to Admission medications   Medication Sig Start Date End Date Taking? Authorizing Provider  fexofenadine (ALLEGRA) 180 MG tablet Take 180 mg by mouth daily.   Yes [provider]  HYDROcodone-acetaminophen (NORCO) 5-325 MG tablet Take 1 tablet by mouth every 6 (six) hours as needed. 10/15/17  Yes Julianne Rice, MD  ibuprofen (ADVIL,MOTRIN) 200 MG tablet Take 400 mg by mouth every 6 (six) hours as needed for headache, mild pain or moderate pain.   Yes [provider]  lisinopril-hydrochlorothiazide (PRINZIDE,ZESTORETIC) 10-12.5 MG per tablet Take 1 tablet by mouth daily. 05/05/15  Yes [provider]  naproxen sodium (ALEVE) 220 MG tablet Take 440 mg by mouth daily as needed (pain).   Yes [provider]  OVER THE COUNTER MEDICATION Apply 1 application topically daily as needed (knee pain). MAGNESIUM OIL APPLIED TO KNEE FOR ARTHRITIS PAIN   Yes [provider]  aspirin 81 MG tablet Take 162 mg by mouth daily.    [provider]  Cholecalciferol (VITAMIN D) 2000 UNITS CAPS Take 2,000 Units by mouth daily.     [provider]  Ferrous Sulfate (IRON) 325 (65 Fe) MG TABS Take by mouth daily.    [provider]  fluticasone (FLONASE) 50 MCG/ACT nasal spray Place 1 spray into both nostrils daily as needed for allergies or rhinitis.     [provider]  Multiple Vitamins-Minerals (MULTIVITAMIN ADULT PO) Take 1 tablet by mouth daily.    [provider]  TURMERIC PO Take by mouth daily.    [provider]    Current Facility-Administered Medications  Medication Dose Route Frequency Provider Last Rate Last Dose  . 0.9 %  sodium chloride infusion   Intravenous Continuous Marene Lenz, MD 75 mL/hr at 10/23/17 2034    . acetaminophen (TYLENOL) tablet 650 mg  650 mg Oral Q6H PRN Schorr,  Rhetta Mura, NP   650 mg at 10/24/17 0033  . cholecalciferol (VITAMIN D) tablet 2,000 Units  2,000 Units Oral Daily Marene Lenz, MD   2,000 Units at 10/24/17 1020  . lisinopril (PRINIVIL,ZESTRIL) tablet 10 mg  10 mg Oral Daily Jodie Echevaria, Amrit, MD   10 mg at 10/24/17 1020  . morphine 2 MG/ML injection 2 mg  2 mg Intravenous Q4H PRN Marene Lenz, MD   2 mg at 10/23/17 2030  . ondansetron (ZOFRAN) injection 4 mg  4 mg Intravenous Q6H PRN Adhikari Bk, Amrit, MD      . pantoprazole (PROTONIX) injection 40 mg  40 mg Intravenous Q12H Adhikari Bk, Amrit, MD   40 mg at 10/24/17 1020  . polyethylene glycol-electrolytes (NuLYTELY/GoLYTELY) solution 4,000 mL  4,000 mL Oral Once Ronnette Juniper, MD        Allergies as of 10/23/2017 - Review Complete 10/23/2017  Allergen Reaction Noted  . Ciprofloxacin Nausea And Vomiting 07/16/2012  . Sulfa antibiotics  07/16/2012    Family History  Problem Relation Age of Onset  . Diabetes Mother   . Hypertension Mother   . Thyroid disease Sister   . Seizures Brother   . Diabetes Brother   . Hypertension Brother   . Diabetes Brother   . Arthritis Unknown   . Diabetes Unknown     Social History   Socioeconomic History  . Marital status: Divorced    Spouse name: Not on file  . Number of children: Not on file  . Years of education: college  . Highest education level: Not on file  Social Needs  . Financial resource strain: Not on file  . Food insecurity - worry: Not on file  . Food insecurity - inability: Not on file  . Transportation needs - medical: Not on file  . Transportation needs - non-medical: Not on file  Occupational History  . Not on file  Tobacco Use  . Smoking status: Never Smoker  . Smokeless tobacco: Never Used  Substance and Sexual Activity  . Alcohol use: Yes    Comment: occasional  . Drug use: No  . Sexual activity: No    Birth control/protection: Surgical    Comment: TAH/BSO  Other Topics Concern  . Not on file   Social History Narrative  . Not on file    Review of Systems: Positive for GI: Described in detail in HPI.    Gen: Denies any fever, chills, rigors, night sweats, anorexia, fatigue, weakness, malaise, involuntary weight loss, and sleep disorder CV: Denies chest pain, angina, palpitations, syncope, orthopnea, PND, peripheral edema, and claudication. Resp: Denies dyspnea, cough, sputum, wheezing, coughing up blood. GU : Denies urinary burning, blood in urine, urinary frequency, urinary hesitancy, nocturnal urination, and urinary incontinence. MS: Denies joint pain or swelling.  Denies muscle weakness, cramps, atrophy.  Derm: Denies rash, itching, oral ulcerations, hives, unhealing ulcers.  Psych: Denies depression, anxiety, memory loss, suicidal ideation, hallucinations,  and confusion. Heme: bleeding,Denies bruising,  and enlarged lymph nodes. Neuro:  Denies any headaches, dizziness, paresthesias. Endo:  Denies any problems with DM, thyroid, adrenal function.  Physical Exam: Vital signs in last 24 hours: Temp:  [97.7 F (36.5 C)-99.1 F (37.3 C)] 98.4 F (36.9 C) (12/14 0554) Pulse Rate:  [69-72] 70 (12/14 0554) Resp:  [13-18] 18 (12/14 0554) BP: (114-141)/(70-77) 114/72 (12/14 0554) SpO2:  [96 %-100 %] 100 % (12/14 0554) Last BM Date: 10/22/17  General:   Alert,  Well-developed, well-nourished, pleasant and cooperative in NAD Head:  Normocephalic and atraumatic. Eyes:  Sclera clear, no icterus.   Mild pallor Ears:  Normal auditory acuity. Nose:  No deformity, discharge,  or lesions. Mouth:  No deformity or lesions.  Oropharynx pink & moist. Neck:  Supple; no masses or thyromegaly. Lungs:  Clear throughout to auscultation.   No wheezes, crackles, or rhonchi. No acute distress. Heart:  Regular rate and rhythm; no murmurs, clicks, rubs,  or gallops. Extremities:  Without clubbing or edema. Neurologic:  Alert and  oriented x4;  grossly normal neurologically. Skin:  Intact  without significant lesions or rashes. Psych:  Alert and cooperative. Normal mood and affect. Abdomen:  Soft, nontender and nondistended. No masses, hepatosplenomegaly or hernias noted. Normal bowel sounds, without guarding, and without rebound.         Lab Results: Recent Labs    10/23/17 1145 10/24/17 0436  WBC 7.1 6.9  HGB 11.9* 10.1*  HCT 37.0 32.7*  PLT 254 206   BMET Recent Labs    10/23/17 1145 10/23/17 1630 10/24/17 0436  NA 138 140 140  K 2.9* 3.3* 4.4  CL 103 108 111  CO2 28 26 27   GLUCOSE 112* 86 98  BUN 16 14 10   CREATININE 0.73 0.64 0.65  CALCIUM 9.3 8.5* 8.6*   LFT Recent Labs    10/23/17 1145  PROT 7.9  ALBUMIN 4.0  AST 27  ALT 21  ALKPHOS 94  BILITOT 0.7   PT/INR No results for input(s): LABPROT, INR in the last 72 hours.  Studies/Results: No results found.  Impression: 1. Rectal bleeding 2. Nausea, vomiting,diarrhea with abdominal pain, normal WBC count 3. Anemia hemoglobin 11.9 on admission 10.1 today, normocytic normal platelets 4. Hypokalemia at 2.9 on admission, resolved 4.4 today 5. Pancreatic divisum noted on CAT scan from 09/02/16   Plan: Differential diagnosis includes ischemic colitis, less likely diverticular bleeding as patient had abdominal pain. Patient remains hemodynamically stable and is agreeable for colonoscopy. Continue IV fluids at 75 mL an hour, start patient on clear liquid diet and nothing by mouth post midnight. Agreeable to take a bowel prep, will be given split dose, monitor H&H and transfuse if hemoglobin less than 7.   LOS: 1 day   Ronnette Juniper, M.D.  10/24/2017, 1:31 PM  Pager [phone] If no answer or after 5 PM call [phone]

## 2017-10-24 NOTE — Progress Notes (Signed)
[facility] PROGRESS NOTE  Aline Wesche BJY:[id number] DOB: 1950/12/24 DOA: 10/23/2017 PCP: Cari Caraway, MD  Interim summary and history of present illness. 66 year old female with past medical history of hypertension, chronic back pain, Seasonal allergies who presents to the emergency department in [facility] health at [facility] with complaints of multiple episodes of bright red blood per rectum.  Patient with approximately 8-10 episodes prior to admission of bloody stools.  Patient reports on intermittent use of ibuprofen for chronic back pain.  Denies any history of GI bleed in the past but reported to have history of gastric ulcer about 25 years ago.  Last EGD and colonoscopy in 2017 by Dr. Cristina Gong.  Assessment/Plan: 1-lower GI bleed/bright red blood per rectum:  -Most likely secondary to ischemic colitis; patient having abdominal pain with subsequent bloody stools. -Continue IV fluids, supportive care and after discussing with GI the plan is for colonoscopy in a.m. -Maintain clear liquid diet and modified bowel preparation throughout the night. -Hemoglobin has remained stable overall (no transfusion required)  2-acute blood loss anemia -Patient hemoglobin 11.9 on admission and subsequently dropped to 10.1 -This is mild in nature and has not required any transfusion -Continue monitoring hemoglobin trend.  3-hypokalemia: -2.9 on admission most likely secondary to poor p.o. intake and GI loss. -Repleted -Potassium 4.4 today.  4-hypertension -Blood pressure has remained stable -Continue lisinopril -Holding HCTZ in the setting of hypokalemia, mild dehydration on admission.  5-nausea, vomiting and diarrhea: -No fever, normal WBCs -No signs of acute infection, no fever and nontoxic in appearance. -Patient reports no further symptoms at this moment. -Continue supportive care and as needed antiemetics.  Code Status: Full code. Family Communication: No family at  bedside. Disposition Plan: Remains inpatient, follow hemoglobin trend, patient to have modified bowel preparation and will be On clear liquid diet with anticipated colonoscopy in a.m.   Consultants: GI service (equal GI)  Procedures:  Colonoscopy procedure on 10/25/17   Antibiotics:  None  HPI/Subjective: No fever, no chest pain, no nausea, no vomiting, denies shortness of breath and reported no further bloody stools since admission.  Objective: Vitals:   10/24/17 0554 10/24/17 1400  BP: 114/72 115/67  Pulse: 70 76  Resp: 18 18  Temp: 98.4 F (36.9 C) 97.7 F (36.5 C)  SpO2: 100% 100%    Intake/Output Summary (Last 24 hours) at 10/24/2017 1758 Last data filed at 10/24/2017 1400 Gross per 24 hour  Intake 1300 ml  Output 400 ml  Net 900 ml   Filed Weights   10/23/17 1111  Weight: 79.8 kg (176 lb)    Exam:   General: Afebrile, no chest pain, no shortness of breath, no nausea vomiting.  Currently reporting no abdominal pain.  Patient has had no further bloody stools.  Cardiovascular: S1 and S2, no rubs, no gallops, no murmurs, no JVD.  Respiratory: Good air movement bilaterally, no wheezing, no crackles, no using accessory muscles.  Abdomen: Soft, nontender, nondistended, positive bowel sounds.  Musculoskeletal: No  edema, cyanosis or clubbing.  Data Reviewed: Basic Metabolic Panel: Recent Labs  Lab 10/23/17 1145 10/23/17 1630 10/24/17 0436  NA 138 140 140  K 2.9* 3.3* 4.4  CL 103 108 111  CO2 28 26 27   GLUCOSE 112* 86 98  BUN 16 14 10   CREATININE 0.73 0.64 0.65  CALCIUM 9.3 8.5* 8.6*  MG  --  1.8  --    Liver Function Tests: Recent Labs  Lab 10/23/17 1145  AST 27  ALT 21  ALKPHOS  94  BILITOT 0.7  PROT 7.9  ALBUMIN 4.0   Recent Labs  Lab 10/23/17 1145  LIPASE 19   CBC: Recent Labs  Lab 10/23/17 1145 10/24/17 0436  WBC 7.1 6.9  NEUTROABS 3.9  --   HGB 11.9* 10.1*  HCT 37.0 32.7*  MCV 81.9 84.1  PLT 254 206    Studies: No  results found.  Scheduled Meds: . cholecalciferol  2,000 Units Oral Daily  . lisinopril  10 mg Oral Daily  . pantoprazole (PROTONIX) IV  40 mg Intravenous Q12H  . [START ON 10/25/2017] polyethylene glycol  2,000 mL Oral Once   Followed by  . polyethylene glycol  2,000 mL Oral Once   Continuous Infusions: . sodium chloride 75 mL/hr at 10/24/17 1400    Principal Problem:   GI bleed Active Problems:   Essential hypertension   Back pain   Hypokalemia   Nausea vomiting and diarrhea   Hematochezia   Time spent: 25 minutes    [facility] Pager [phone]. If 7PM-7AM, please contact night-coverage at [email or web address], password [facility] 10/24/2017, 5:58 PM  LOS: 1 day

## 2017-10-25 ENCOUNTER — Encounter (HOSPITAL_COMMUNITY)
Admission: EM | Disposition: A | Discharge status: Home / Self Care | Payer: Self-pay | Source: Home / Self Care | Attending: Internal Medicine

## 2017-10-25 ENCOUNTER — Encounter (HOSPITAL_COMMUNITY): Payer: Self-pay | Admitting: Anesthesiology

## 2017-10-25 ENCOUNTER — Inpatient Hospital Stay (HOSPITAL_COMMUNITY): Payer: Federal, State, Local not specified - PPO | Admitting: Certified Registered Nurse Anesthetist

## 2017-10-25 DIAGNOSIS — K922 Gastrointestinal hemorrhage, unspecified: Secondary | ICD-10-CM

## 2017-10-25 HISTORY — PX: COLONOSCOPY WITH PROPOFOL: SHX5780

## 2017-10-25 LAB — BASIC METABOLIC PANEL
Anion gap: 5 (ref 5–15)
BUN: 7 mg/dL (ref 6–20)
CO2: 27 mmol/L (ref 22–32)
Calcium: 8.7 mg/dL — ABNORMAL LOW (ref 8.9–10.3)
Chloride: 107 mmol/L (ref 101–111)
Creatinine, Ser: 0.61 mg/dL (ref 0.44–1.00)
GFR calc Af Amer: >60 mL/min (ref >60)
GFR calc non Af Amer: >60 mL/min (ref >60)
Glucose, Bld: 85 mg/dL (ref 65–99)
Potassium: 4 mmol/L (ref 3.5–5.1)
Sodium: 139 mmol/L (ref 135–145)

## 2017-10-25 LAB — CBC
HCT: 35.8 % — ABNORMAL LOW (ref 36.0–46.0)
Hemoglobin: 10.9 g/dL — ABNORMAL LOW (ref 12.0–15.0)
MCH: 25.6 pg — ABNORMAL LOW (ref 26.0–34.0)
MCHC: 30.4 g/dL (ref 30.0–36.0)
MCV: 84.2 fL (ref 78.0–100.0)
Platelets: 216 10*3/uL (ref 150–400)
RBC: 4.25 MIL/uL (ref 3.87–5.11)
RDW: 15.6 % — ABNORMAL HIGH (ref 11.5–15.5)
WBC: 6.9 10*3/uL (ref 4.0–10.5)

## 2017-10-25 SURGERY — COLONOSCOPY WITH PROPOFOL
Anesthesia: Monitor Anesthesia Care | Laterality: Left

## 2017-10-25 MED ORDER — TRAMADOL HCL 50 MG PO TABS: 50.0000 mg | ORAL_TABLET | Freq: Four times a day (QID) | ORAL | 0 refills | Status: DC | PRN

## 2017-10-25 MED ORDER — LACTATED RINGERS IV SOLN
INTRAVENOUS | Status: AC | PRN
  Administered 2017-10-25: 1000 mL via INTRAVENOUS

## 2017-10-25 MED ORDER — PROPOFOL 500 MG/50ML IV EMUL
INTRAVENOUS | Status: DC | PRN
  Administered 2017-10-25: 75 ug/kg/min via INTRAVENOUS

## 2017-10-25 MED ORDER — SODIUM CHLORIDE 0.9 % IV SOLN
INTRAVENOUS | Status: DC
  Administered 2017-10-25: 01:00:00 via INTRAVENOUS

## 2017-10-25 MED ORDER — AMOXICILLIN-POT CLAVULANATE 875-125 MG PO TABS: 1.0000 | ORAL_TABLET | Freq: Two times a day (BID) | ORAL | 0 refills | Status: AC

## 2017-10-25 MED ORDER — AMOXICILLIN-POT CLAVULANATE 875-125 MG PO TABS
1.0000 | ORAL_TABLET | Freq: Two times a day (BID) | ORAL | Status: DC
  Administered 2017-10-25: 1 via ORAL
  Filled 2017-10-25: qty 1

## 2017-10-25 MED ORDER — ONDANSETRON HCL 4 MG/2ML IJ SOLN
INTRAMUSCULAR | Status: DC | PRN
  Administered 2017-10-25: 4 mg via INTRAVENOUS

## 2017-10-25 MED ORDER — PROPOFOL 10 MG/ML IV BOLUS
INTRAVENOUS | Status: AC
  Filled 2017-10-25: qty 40

## 2017-10-25 MED ORDER — PROPOFOL 10 MG/ML IV BOLUS
INTRAVENOUS | Status: DC | PRN
  Administered 2017-10-25 (×2): 20 mg via INTRAVENOUS
  Administered 2017-10-25: 50 mg via INTRAVENOUS

## 2017-10-25 NOTE — Anesthesia Postprocedure Evaluation (Signed)
Anesthesia Post Note  Patient: Margaret [name]  Procedure(s) Performed: COLONOSCOPY WITH PROPOFOL (Left )     Patient location during evaluation: PACU Anesthesia Type: MAC Level of consciousness: awake and alert Pain management: pain level controlled Vital Signs Assessment: post-procedure vital signs reviewed and stable Respiratory status: spontaneous breathing, nonlabored ventilation and respiratory function stable Cardiovascular status: stable and blood pressure returned to baseline Postop Assessment: no apparent nausea or vomiting Anesthetic complications: no    Last Vitals:  Vitals:   10/25/17 1045 10/25/17 1050  BP:  (!) 130/59  Pulse:  65  Resp:  17  Temp:    SpO2: 98% 97%    Last Pain:  Vitals:   10/25/17 1039  TempSrc: Oral  PainSc:                  Sherina Stammer,W. EDMOND

## 2017-10-25 NOTE — Op Note (Addendum)
Colonoscopy was performed for rectal bleed and abdominal pain.  Patient had sigmoid and descending colon diverticulosis. From 30-60 cm from insertion, mucosa appeared abnormal-ulcerated, inflamed compatible with ischemic colitis. Biopsies have been taken. 2 small polyps were noted in hepatic flexure, both removed via biopsy polypectomy. As of the colon appeared unremarkable.   Recommendation: Follow-up with Dr.Buccini as an outpatient 2-3 weeks. Repeat colonoscopy depending upon pathology report in 3-5 years. Patient is allergic to cipro, recommend Augmentin BID for 5 days. May resume regular diet and discharge from GI standpoint.  Ronnette Juniper, M.D.

## 2017-10-25 NOTE — Anesthesia Preprocedure Evaluation (Addendum)
Anesthesia Evaluation  Patient identified by MRN, date of birth, ID band Patient awake    Reviewed: Allergy & Precautions, H&P , NPO status , Patient's Chart, lab work & pertinent test results  Airway Mallampati: II  TM Distance: >3 FB Neck ROM: Full    Dental no notable dental hx. (+) Partial Upper, Dental Advisory Given   Pulmonary neg pulmonary ROS,    Pulmonary exam normal breath sounds clear to auscultation       Cardiovascular hypertension, Pt. on medications  Rhythm:Regular Rate:Normal     Neuro/Psych negative neurological ROS  negative psych ROS   GI/Hepatic negative GI ROS, Neg liver ROS,   Endo/Other  negative endocrine ROS  Renal/GU negative Renal ROS  negative genitourinary   Musculoskeletal  (+) Arthritis , Osteoarthritis,    Abdominal   Peds  Hematology negative hematology ROS (+) anemia ,   Anesthesia Other Findings   Reproductive/Obstetrics negative OB ROS                            Anesthesia Physical Anesthesia Plan  ASA: II  Anesthesia Plan: MAC   Post-op Pain Management:    Induction: Intravenous  PONV Risk Score and Plan: 2 and Propofol infusion and Treatment may vary due to age or medical condition  Airway Management Planned: Simple Face Mask and Natural Airway  Additional Equipment:   Intra-op Plan:   Post-operative Plan:   Informed Consent: I have reviewed the patients History and Physical, chart, labs and discussed the procedure including the risks, benefits and alternatives for the proposed anesthesia with the patient or authorized representative who has indicated his/her understanding and acceptance.   Dental advisory given  Plan Discussed with: CRNA  Anesthesia Plan Comments:         Anesthesia Quick Evaluation

## 2017-10-25 NOTE — Op Note (Signed)
[facility] Patient Name: Margaret [name] Procedure Date: 10/25/2017 MRN: [id number] Attending MD: Ronnette Juniper , MD Date of Birth: [birth year] CSN: [id number] Age: 66 Admit Type: Inpatient Procedure:                Colonoscopy Indications:              Last colonoscopy: 2017, Lower abdominal pain,                            Rectal bleeding Providers:                Ronnette Juniper, MD, Carollee Sires,                            Technician Referring MD:              Medicines:                Monitored Anesthesia Care Complications:            No immediate complications. Estimated Blood Loss:     Estimated blood loss: none. Procedure:                Pre-Anesthesia Assessment:                           - Prior to the procedure, a History and Physical                            was performed, and patient medications and                            allergies were reviewed. The patient's tolerance of                            previous anesthesia was also reviewed. The risks                            and benefits of the procedure and the sedation                            options and risks were discussed with the patient.                            All questions were answered, and informed consent                            was obtained. Prior Anticoagulants: The patient has                            taken no previous anticoagulant or antiplatelet                            agents. ASA Grade Assessment: II - A patient with                            mild systemic disease. After reviewing  the risks                            and benefits, the patient was deemed in                            satisfactory condition to undergo the procedure.                           After obtaining informed consent, the colonoscope                            was passed under direct vision. Throughout the                            procedure, the patient's blood pressure,  pulse, and                            oxygen saturations were monitored continuously. The                            EC-[id number] ([id number]) scope was introduced through                            the anus and advanced to the the cecum, identified                            by appendiceal orifice and ileocecal valve. The                            colonoscopy was performed without difficulty. The                            patient tolerated the procedure well. The quality                            of the bowel preparation was adequate to identify                            polyps 6 mm and larger in size. The ileocecal                            valve, appendiceal orifice, and rectum were                            photographed. Scope In: 10:10:39 AM Scope Out: 10:32:02 AM Scope Withdrawal Time: 0 hours 11 minutes 42 seconds  Total Procedure Duration: 0 hours 21 minutes 23 seconds  Findings:      The perianal and digital rectal examinations were normal.      Two sessile polyps were found in the hepatic flexure. The polyps were 4       to 7 mm in size. These polyps were removed with a piecemeal technique       using a cold biopsy forceps. Resection and retrieval were complete.  Multiple small and large-mouthed diverticula were found in the sigmoid       colon and descending colon.      A patchy area of moderately altered vascular, erythematous, inflamed and       ulcerated mucosa was found from 30 to 60 cm proximal to the anus.       Biopsies were taken with a cold forceps for histology.      The retroflexed view of the distal rectum and anal verge was normal and       showed no anal or rectal abnormalities. Impression:               - Two 4 to 7 mm polyps at the hepatic flexure,                            removed piecemeal using a cold biopsy forceps.                            Resected and retrieved.                           - Moderate diverticulosis in the sigmoid colon and                             in the descending colon.                           - Altered vascular, erythematous, inflamed and                            ulcerated mucosa from 30 to 60 cm proximal to the                            anus. Biopsied. Suspicious for ischemic colitis.                           - The distal rectum and anal verge are normal on                            retroflexion view. Moderate Sedation:      Patient did not receive moderate sedation for this procedure, but       instead received monitored anesthesia care. Recommendation:           - Resume regular diet.                           - Continue present medications.                           - Await pathology results.                           - Cipro (ciprofloxacin) 500 mg PO BID for 7 days.                           - Flagyl (metronidazole) 500 mg PO TID for 7 days.                           -  Return patient to hospital ward for ongoing care.                           - Repeat colonoscopy in 3 - 5 years for                            surveillance based on pathology results. Procedure Code(s):        --- Professional ---                           [phone], Colonoscopy, flexible; with biopsy, single                            or multiple Diagnosis Code(s):        --- Professional ---                           D12.3, Benign neoplasm of transverse colon (hepatic                            flexure or splenic flexure)                           K63.89, Other specified diseases of intestine                           K52.9, Noninfective gastroenteritis and colitis,                            unspecified                           K63.3, Ulcer of intestine                           R10.30, Lower abdominal pain, unspecified                           K62.5, Hemorrhage of anus and rectum                           K57.30, Diverticulosis of large intestine without                            perforation or abscess without bleeding CPT  copyright 2016 American Medical Association. All rights reserved. The codes documented in this report are preliminary and upon coder review may  be revised to meet current compliance requirements. Ronnette Juniper, MD 10/25/2017 10:46:24 AM This report has been signed electronically. Number of Addenda: 0

## 2017-10-25 NOTE — Transfer of Care (Signed)
Immediate Anesthesia Transfer of Care Note  Patient: Margaret [name]  Procedure(s) Performed: Procedure(s): COLONOSCOPY WITH PROPOFOL (Left)  Patient Location: PACU  Anesthesia Type:MAC  Level of Consciousness:  sedated, patient cooperative and responds to stimulation  Airway & Oxygen Therapy:Patient Spontanous Breathing and Patient connected to face mask oxgen  Post-op Assessment:  Report given to PACU RN and Post -op Vital signs reviewed and stable  Post vital signs:  Reviewed and stable  Last Vitals:  Vitals:   10/25/17 0928 10/25/17 0930  BP: (!) 147/78   Pulse: 61   Resp: 14   Temp: 36.8 C   SpO2: 93% 734%    Complications: No apparent anesthesia complications

## 2017-10-25 NOTE — Interval H&P Note (Signed)
History and Physical Interval Note:  66 year old female with abdominal pain and rectal bleeding is here for a colonoscopy. 10/25/2017 10:00 AM  Margaret [name]  has presented today for colonoscopy, with the diagnosis of Rectal bleeding  The various methods of treatment have been discussed with the patient and family. After consideration of risks, benefits and other options for treatment, the patient has consented to  Procedure(s): COLONOSCOPY WITH PROPOFOL (Left) as a surgical intervention .  The patient's history has been reviewed, patient examined, no change in status, stable for surgery.  I have reviewed the patient's chart and labs.  Questions were answered to the patient's satisfaction.     Ronnette Juniper

## 2017-10-25 NOTE — Progress Notes (Signed)
Pt discharged via foot per request to front entrance accompanied by NT. Assessment unchanged. Pt verbalized understanding of dc instructions through teach back including when to follow up with MD and GI. No scripts at dc.

## 2017-10-25 NOTE — Brief Op Note (Signed)
10/23/2017 - 10/25/2017  10:47 AM  PATIENT:  Margaret [name]  66 y.o. female  PRE-OPERATIVE DIAGNOSIS:  Rectal bleeding  POST-OPERATIVE DIAGNOSIS:  * No post-op diagnosis entered *  PROCEDURE:  Procedure(s): COLONOSCOPY WITH PROPOFOL (Left)  SURGEON:  Surgeon(s) and Role:    Ronnette Juniper, MD - Primary  PHYSICIAN ASSISTANT:   ASSISTANTS: {Shanice Antionette Poles, RN, William Dalton, Tech  ANESTHESIA:   MAC  EBL:  None  BLOOD ADMINISTERED:none  DRAINS: none   LOCAL MEDICATIONS USED:  NONE  SPECIMEN:  Biopsy / Limited Resection  DISPOSITION OF SPECIMEN:  PATHOLOGY  COUNTS:  YES  TOURNIQUET:  * No tourniquets in log *  DICTATION: .Dragon Dictation  PLAN OF CARE: Admit to inpatient   PATIENT DISPOSITION:  PACU - hemodynamically stable.   Delay start of Pharmacological VTE agent (>24hrs) due to surgical blood loss or risk of bleeding: no

## 2017-10-27 ENCOUNTER — Encounter (HOSPITAL_COMMUNITY): Payer: Self-pay | Admitting: Gastroenterology

## 2017-10-28 NOTE — Discharge Summary (Signed)
[facility] Discharge Summary   Patient: Margaret [name] FTD:[id number]   PCP: Cari Caraway, MD DOB: 1951/07/16   Date of admission: 10/23/2017   Date of discharge: 10/25/2017     Discharge Diagnoses:  Principal Problem:   GI bleed Active Problems:   Essential hypertension   Back pain   Hypokalemia   Nausea vomiting and diarrhea   Hematochezia   Admitted From: home Disposition:  home  Recommendations for Outpatient Follow-up:  1. Please follow-up with PCP in 1 week  Follow-up Information    Cari Caraway, MD. Schedule an appointment as soon as possible for a visit in 1 week(s).   Specialty:  Family Medicine Contact information: [place] [address] [phone]          Diet recommendation: Cardiac diet  Activity: The patient is advised to gradually reintroduce usual activities.  Discharge Condition: good  Code Status: Full code  History of present illness: As per the H and P dictated on admission, " Patient is a 66 year old female with past medical history of hypertension, chronic back pain , Seasonal allergies who presents to the emergency department in [facility] health at [facility] with complaints of multiple episodes of bright red blood per rectum.  Patient was sent for direct admission to here. Patient is stated that last night after she took her dinner, she started having loose stools.  Patient was also nauseated and started to throw up.  The first few diarrheal episodes were loose stools containing food particles which were later followed by just bright blood.  She had about 5-6 episodes of bright red blood per rectum, small volumes, no clots.  Every time she got up, she had an urge  stool defecate which was just blood.Patient was also having crampy abdominal pain.  Patient then presented to the emergency department at [facility]. Stool occult blood was found to be positive. Till my evaluation, patient had about 8-10 episodes.   Patient reported of taking ibuprofen occasionally for her back pain.  Her last intake was past weekend.  She denies taking any aspirin recently.  She denies any history of GI bleed in the past but said she was told to have a gastric ulcer about 25 years ago.  Her last EGD and colonoscopy was about a year ago by Dr Cristina Gong.  She had some reflux/stomach issues.  Colonoscopy just showed few polyps. Patient's hemoglobin was found to be 11.9 on presentation and there is no active bleeding at present.  Her nausea and vomiting has improved.  Still complains of mild abdominal pain which is generalized.  Patient also found to have severe hypokalemia on presentation and is being supplemented with potassium. He denies any chest pain, shortness of breath, dysuria, fever, chills or headache.  "  Hospital Course:  Summary of her active problems in the hospital is as following. 1-lower GI bleed/bright red blood per rectum:  -Most likely secondary to ischemic colitis; patient having abdominal pain with subsequent bloody stools. -Treated conservatively.  No further bleeding in the hospital.  Underwent colonoscopy biopsies were taken.  Patient will follow up with gastroenterology as an outpatient for further workup and treatment. Recommend to avoid NSAIDs. GI also recommends to treat the patient with oral Augmentin for 5 days. 62-acute blood loss anemia -Patient hemoglobin 11.9 on admission and subsequently dropped to 10.1 -This is mild in nature and has not required any transfusion -Continue monitoring hemoglobin trend.  3-hypokalemia: -2.9 on admission most likely secondary to poor p.o.  intake and GI loss. -Repleted  4-hypertension -Blood pressure has remained stable -Continue home medications  5-nausea, vomiting and diarrhea: -No fever, normal WBCs -No signs of acute infection, no fever and nontoxic in appearance. -Patient reports no further symptoms at this moment. -Continue supportive care and as  needed antiemetics.  6.  Recent third toe fracture. Patient has seen [facility], recommend to continue outpatient follow-up.  All other chronic medical condition were stable during the hospitalization.  Patient was ambulatory without any assistance. On the day of the discharge the patient's vitals were stable, and no other acute medical condition were reported by patient. the patient was felt safe to be discharge at home with family.  Procedures and Results:  Colonoscopy Patient had sigmoid and descending colon diverticulosis. From 30-60 cm from insertion, mucosa appeared abnormal-ulcerated, inflamed compatible with ischemic colitis. Biopsies have been taken. 2 small polyps were noted in hepatic flexure, both removed via biopsy polypectomy. As of the colon appeared unremarkable.   Consultations:  [facility]  DISCHARGE MEDICATION: Allergies as of 10/25/2017      Reactions   Ciprofloxacin Nausea And Vomiting   Sulfa Antibiotics    Unknown      Medication List    STOP taking these medications   ibuprofen 200 MG tablet Commonly known as:  ADVIL,MOTRIN   naproxen sodium 220 MG tablet Commonly known as:  ALEVE     TAKE these medications   amoxicillin-clavulanate 875-125 MG tablet Commonly known as:  AUGMENTIN Take 1 tablet by mouth 2 (two) times daily for 5 days.   aspirin 81 MG tablet Take 162 mg by mouth daily.   fexofenadine 180 MG tablet Commonly known as:  ALLEGRA Take 180 mg by mouth daily.   fluticasone 50 MCG/ACT nasal spray Commonly known as:  FLONASE Place 1 spray into both nostrils daily as needed for allergies or rhinitis.   HYDROcodone-acetaminophen 5-325 MG tablet Commonly known as:  NORCO Take 1 tablet by mouth every 6 (six) hours as needed.   Iron 325 (65 Fe) MG Tabs Take by mouth daily.   lisinopril-hydrochlorothiazide 10-12.5 MG tablet Commonly known as:  PRINZIDE,ZESTORETIC Take 1 tablet by mouth daily.     MULTIVITAMIN ADULT PO Take 1 tablet by mouth daily.   OVER THE COUNTER MEDICATION Apply 1 application topically daily as needed (knee pain). MAGNESIUM OIL APPLIED TO KNEE FOR ARTHRITIS PAIN   traMADol 50 MG tablet Commonly known as:  ULTRAM Take 1 tablet (50 mg total) by mouth every 6 (six) hours as needed for severe pain.   TURMERIC PO Take by mouth daily.   Vitamin D 2000 units Caps Take 2,000 Units by mouth daily.      Allergies  Allergen Reactions  . Ciprofloxacin Nausea And Vomiting  . Sulfa Antibiotics     Unknown   Discharge Instructions    Diet - low sodium heart healthy   Complete by:  As directed    Discharge instructions   Complete by:  As directed    It is important that you read following instructions as well as go over your medication list with RN to help you understand your care after this hospitalization.  Discharge Instructions: Please follow-up with PCP in one week  Please request your primary care physician to go over all Hospital Tests and Procedure/Radiological results at the follow up,  Please get all Hospital records sent to your PCP by signing hospital release before you go home.   Do not take more than prescribed  Pain, Sleep and Anxiety Medications. You were cared for by a hospitalist during your hospital stay. If you have any questions about your discharge medications or the care you received while you were in the hospital after you are discharged, you can call the unit and ask to speak with the hospitalist on call if the hospitalist that took care of you is not available.  Once you are discharged, your primary care physician will handle any further medical issues. Please note that NO REFILLS for any discharge medications will be authorized once you are discharged, as it is imperative that you return to your primary care physician (or establish a relationship with a primary care physician if you do not have one) for your aftercare needs so that they  can reassess your need for medications and monitor your lab values. You Must read complete instructions/literature along with all the possible adverse reactions/side effects for all the Medicines you take and that have been prescribed to you. Take any new Medicines after you have completely understood and accept all the possible adverse reactions/side effects. Wear Seat belts while driving. If you have smoked or chewed Tobacco in the last 2 yrs please stop smoking and/or stop any Recreational drug use.   Increase activity slowly   Complete by:  As directed      Discharge Exam: Filed Weights   10/23/17 1111 10/25/17 0928  Weight: 79.8 kg (176 lb) 79.8 kg (176 lb)   Vitals:   10/25/17 1120 10/25/17 1336  BP: (!) 144/74 (!) 112/50  Pulse: 61 87  Resp: 18 18  Temp: 98.3 F (36.8 C) 97.7 F (36.5 C)  SpO2: 99% 100%   General: Appear in no distress, no Rash; Oral Mucosa moist. Cardiovascular: S1 and S2 Present, no Murmur, no JVD Respiratory: Bilateral Air entry present and Clear to Auscultation, n Crackles, no wheezes Abdomen: Bowel Sound present, Soft and no tenderness Extremities: no Pedal edema, no calf tenderness Neurology: Grossly no focal neuro deficit.  The results of significant diagnostics from this hospitalization (including imaging, microbiology, ancillary and laboratory) are listed below for reference.    Significant Diagnostic Studies: Dg Toe 3rd Left  Result Date: 10/15/2017 CLINICAL DATA:  Stubbed third toe on suitcase EXAM: LEFT THIRD TOE COMPARISON:  None FINDINGS: There is an acute obliquely oriented a fracture involving the shaft of the third proximal phalanx. No dislocations identified. A second, nondisplaced fracture involves the dorsal plate of the third distal phalanx. IMPRESSION: 1. Acute fracture involves the shaft of the third proximal phalanx. There is also a fracture involving the dorsal plate of the distal phalanx. Electronically Signed   By: Kerby Moors  M.D.   On: 10/15/2017 08:45    Microbiology: No results found for this or any previous visit (from the past 240 hour(s)).   Labs: CBC: Recent Labs  Lab 10/23/17 1145 10/24/17 0436 10/25/17 0430  WBC 7.1 6.9 6.9  NEUTROABS 3.9  --   --   HGB 11.9* 10.1* 10.9*  HCT 37.0 32.7* 35.8*  MCV 81.9 84.1 84.2  PLT 254 206 440   Basic Metabolic Panel: Recent Labs  Lab 10/23/17 1145 10/23/17 1630 10/24/17 0436 10/25/17 0430  NA 138 140 140 139  K 2.9* 3.3* 4.4 4.0  CL 103 108 111 107  CO2 28 26 27 27   GLUCOSE 112* 86 98 85  BUN 16 14 10 7   CREATININE 0.73 0.64 0.65 0.61  CALCIUM 9.3 8.5* 8.6* 8.7*  MG  --  1.8  --   --  Liver Function Tests: Recent Labs  Lab 10/23/17 1145  AST 27  ALT 21  ALKPHOS 94  BILITOT 0.7  PROT 7.9  ALBUMIN 4.0   Recent Labs  Lab 10/23/17 1145  LIPASE 19   No results for input(s): AMMONIA in the last 168 hours. Cardiac Enzymes: No results for input(s): CKTOTAL, CKMB, CKMBINDEX, TROPONINI in the last 168 hours. BNP (last 3 results) No results for input(s): BNP in the last 8760 hours. CBG: No results for input(s): GLUCAP in the last 168 hours. Time spent: 35 minutes  Signed:  Berle Mull  [facility] 10/25/2017   , 8:26 AM

## 2017-11-19 ENCOUNTER — Ambulatory Visit (INDEPENDENT_AMBULATORY_CARE_PROVIDER_SITE_OTHER): Payer: Federal, State, Local not specified - PPO

## 2017-11-19 ENCOUNTER — Ambulatory Visit (INDEPENDENT_AMBULATORY_CARE_PROVIDER_SITE_OTHER): Payer: Federal, State, Local not specified - PPO | Admitting: Orthopaedic Surgery

## 2017-11-19 ENCOUNTER — Encounter (INDEPENDENT_AMBULATORY_CARE_PROVIDER_SITE_OTHER): Payer: Self-pay | Admitting: Orthopaedic Surgery

## 2017-11-19 DIAGNOSIS — S92515D Nondisplaced fracture of proximal phalanx of left lesser toe(s), subsequent encounter for fracture with routine healing: Secondary | ICD-10-CM | POA: Diagnosis not present

## 2017-11-19 DIAGNOSIS — S92515A Nondisplaced fracture of proximal phalanx of left lesser toe(s), initial encounter for closed fracture: Secondary | ICD-10-CM | POA: Insufficient documentation

## 2017-11-19 NOTE — Progress Notes (Signed)
                                                                                                           The patient is following up 6 weeks status post injury to her left foot third toe.  She sustained a small chip fracture off the dorsum of the distal phalanx and a proximal phalanx fracture both of the third toe.  These are both in good alignment.  She is back to regular shoes now but having still some pain.  On examination she still hurts to palpation over the third toe but her MTP joint is well located as well as the joints and her third toe.  X-rays are obtained today and show significant callus formation around her third toe on the left side at the proximal phalanx with no malalignment.  The joints around it looks normal.  I gave her reassurance that this should heal completely with time and just to give a little more time and avoiding high impact aerobics or shoes that make her foot feel uncomfortable until she feels comfortable.  This is something we do not really need to x-ray again since is already looking so good and she is just let pain be her guide.  All questions concerns were answered and addressed.  She can follow-up as needed.

## 2018-01-04 ENCOUNTER — Encounter (HOSPITAL_BASED_OUTPATIENT_CLINIC_OR_DEPARTMENT_OTHER): Payer: Self-pay | Admitting: *Deleted

## 2018-01-04 ENCOUNTER — Emergency Department (HOSPITAL_BASED_OUTPATIENT_CLINIC_OR_DEPARTMENT_OTHER)
Admission: EM | Admit: 2018-01-04 | Discharge: 2018-01-05 | Disposition: A | Discharge status: Home / Self Care | Payer: Federal, State, Local not specified - PPO | Attending: Emergency Medicine | Admitting: Emergency Medicine

## 2018-01-04 ENCOUNTER — Emergency Department (HOSPITAL_BASED_OUTPATIENT_CLINIC_OR_DEPARTMENT_OTHER): Payer: Federal, State, Local not specified - PPO

## 2018-01-04 ENCOUNTER — Other Ambulatory Visit: Payer: Self-pay

## 2018-01-04 DIAGNOSIS — R079 Chest pain, unspecified: Secondary | ICD-10-CM | POA: Diagnosis present

## 2018-01-04 DIAGNOSIS — I10 Essential (primary) hypertension: Secondary | ICD-10-CM | POA: Insufficient documentation

## 2018-01-04 DIAGNOSIS — R0789 Other chest pain: Secondary | ICD-10-CM | POA: Diagnosis not present

## 2018-01-04 DIAGNOSIS — Z7982 Long term (current) use of aspirin: Secondary | ICD-10-CM | POA: Diagnosis not present

## 2018-01-04 LAB — BASIC METABOLIC PANEL
Anion gap: 8 (ref 5–15)
BUN: 12 mg/dL (ref 6–20)
CO2: 29 mmol/L (ref 22–32)
Calcium: 9 mg/dL (ref 8.9–10.3)
Chloride: 101 mmol/L (ref 101–111)
Creatinine, Ser: 0.63 mg/dL (ref 0.44–1.00)
GFR calc Af Amer: >60 mL/min (ref >60)
GFR calc non Af Amer: >60 mL/min (ref >60)
Glucose, Bld: 94 mg/dL (ref 65–99)
Potassium: 3.3 mmol/L — ABNORMAL LOW (ref 3.5–5.1)
Sodium: 138 mmol/L (ref 135–145)

## 2018-01-04 LAB — CBC
HCT: 35.6 % — ABNORMAL LOW (ref 36.0–46.0)
Hemoglobin: 11.1 g/dL — ABNORMAL LOW (ref 12.0–15.0)
MCH: 26.2 pg (ref 26.0–34.0)
MCHC: 31.2 g/dL (ref 30.0–36.0)
MCV: 84 fL (ref 78.0–100.0)
Platelets: 280 10*3/uL (ref 150–400)
RBC: 4.24 MIL/uL (ref 3.87–5.11)
RDW: 15.9 % — ABNORMAL HIGH (ref 11.5–15.5)
WBC: 5.9 10*3/uL (ref 4.0–10.5)

## 2018-01-04 LAB — TROPONIN I: Troponin I: <0.03 ng/mL (ref <0.03)

## 2018-01-04 NOTE — ED Notes (Signed)
ED Provider at bedside. 

## 2018-01-04 NOTE — ED Triage Notes (Addendum)
Pt reports chest pain since Tuesday. States it started after having a crown done at the dentist. States pain is mid-chest, achy, pt ?acid reflux, States she took a few days prilosec with minimal relief. Here today because she feels more SOB. Reports she drove to [place] 1 week ago

## 2018-01-05 LAB — D-DIMER, QUANTITATIVE: D-Dimer, Quant: 0.38 ug/mL-FEU (ref 0.00–0.50)

## 2018-01-05 LAB — TROPONIN I: Troponin I: <0.03 ng/mL (ref <0.03)

## 2018-01-05 NOTE — Discharge Instructions (Signed)
You were seen today for chest pain.  The cause of your pain at this time is unclear.  However, your heart testing is reassuring.  Your screen for blood clots and this is negative.  Follow-up closely with your primary physician.  You were also given for follow-up with cardiology given your risk factors.  If you have any new or worsening symptoms you should be reevaluated.

## 2018-01-05 NOTE — ED Provider Notes (Signed)
[facility] Provider Note   CSN: [id number] Arrival date & time: 01/04/18  1954     History   Chief Complaint Chief Complaint  Patient presents with  . Chest Pain  . Shortness of Breath    HPI Margaret [name] is a 67 y.o. female.  HPI  This is a 67 year old female with a history of hypertension who presents with chest pain.  Has pain ongoing since Tuesday.  At that time she had a dental procedure.  She reports that it is anterior nonradiating.  Pain comes and goes.  It does not seem to be associated with food intake or exertion.  He has taken Prilosec intermittently with some minimal relief of symptoms.  Currently she is pain-free.  However, earlier today she developed some shortness of breath which concerned her.  She does report upper respiratory symptoms last week including cough.  No recent fevers.  Denies recent travel or hospitalization.  Denies leg swelling.  Denies early family history of heart disease, diabetes, hyper lipidemia.  Past Medical History:  Diagnosis Date  . Allergy   . Anemia   . Dyspareunia   . Endometriosis   . High cholesterol   . Hx of abnormal Pap smear 1980's  . Hypertension   . Osteopenia   . Ulcer   . Urinary incontinence     Patient Active Problem List   Diagnosis Date Noted  . Closed nondisplaced fracture of proximal phalanx of lesser toe of left foot 11/19/2017  . GI bleed 10/23/2017  . Hypokalemia 10/23/2017  . Nausea vomiting and diarrhea 10/23/2017  . Hematochezia 10/23/2017  . Back pain 04/12/2014  . Arthritis 04/12/2014  . Knee pain 04/12/2014  . Essential hypertension 11/27/2013  . Osteoarthritis of left knee 07/16/2012    Past Surgical History:  Procedure Laterality Date  . ABDOMINAL HYSTERECTOMY  09/2008   TVH/BSO--Dr. Quincy Simmonds with TVT  . BLADDER SUSPENSION  09/2008   Dr. Quincy Simmonds  . CERVIX LESION DESTRUCTION  1980   hx abnormal pap--dysplasia  . COLONOSCOPY WITH PROPOFOL Left  10/25/2017   Procedure: COLONOSCOPY WITH PROPOFOL;  Surgeon: Ronnette Juniper, MD;  Location: [facility];  Service: Gastroenterology;  Laterality: Left;    OB History    Gravida Para Term Preterm AB Living   3 3 3     3    SAB TAB Ectopic Multiple Live Births                   Home Medications    Prior to Admission medications   Medication Sig Start Date End Date Taking? Authorizing Provider  lisinopril-hydrochlorothiazide (PRINZIDE,ZESTORETIC) 10-12.5 MG per tablet Take 1 tablet by mouth daily. 05/05/15  Yes [provider]  aspirin 81 MG tablet Take 162 mg by mouth daily.    [provider]  Cholecalciferol (VITAMIN D) 2000 UNITS CAPS Take 2,000 Units by mouth daily.     [provider]  Ferrous Sulfate (IRON) 325 (65 Fe) MG TABS Take by mouth daily.    [provider]  fexofenadine (ALLEGRA) 180 MG tablet Take 180 mg by mouth daily.    [provider]  fluticasone (FLONASE) 50 MCG/ACT nasal spray Place 1 spray into both nostrils daily as needed for allergies or rhinitis.     [provider]  HYDROcodone-acetaminophen (NORCO) 5-325 MG tablet Take 1 tablet by mouth every 6 (six) hours as needed. 10/15/17   Julianne Rice, MD  Multiple Vitamins-Minerals (MULTIVITAMIN ADULT PO) Take 1 tablet by  mouth daily.    [provider]  OVER THE COUNTER MEDICATION Apply 1 application topically daily as needed (knee pain). MAGNESIUM OIL APPLIED TO KNEE FOR ARTHRITIS PAIN    [provider]  traMADol (ULTRAM) 50 MG tablet Take 1 tablet (50 mg total) by mouth every 6 (six) hours as needed for severe pain. 10/25/17 10/25/18  Lavina Hamman, MD  TURMERIC PO Take by mouth daily.    [provider]  cyproheptadine (PERIACTIN) 4 MG tablet Take one half to one 3 times daily as needed for itching and burning sensation Patient not taking: Reported on 08/03/2015 05/13/13 08/03/15  Posey Boyer, MD  diphenhydrAMINE (BENADRYL) 25 MG  tablet Take 1 tablet (25 mg total) by mouth every 6 (six) hours. Patient not taking: Reported on 08/03/2015 05/29/15 08/03/15  Patel-Mills, Orvil Feil, PA-C  ipratropium (ATROVENT) 0.03 % nasal spray Place 2 sprays into the nose every 12 (twelve) hours. Patient not taking: Reported on 08/03/2015 06/21/13 08/03/15  Rikki Spearing P, DO  ranitidine (ZANTAC) 75 MG tablet Take 75 mg by mouth daily as needed for heartburn.  04/06/16  [provider]    Family History Family History  Problem Relation Age of Onset  . Diabetes Mother   . Hypertension Mother   . Thyroid disease Sister   . Seizures Brother   . Diabetes Brother   . Hypertension Brother   . Diabetes Brother   . Arthritis Unknown   . Diabetes Unknown     Social History Social History   Tobacco Use  . Smoking status: Never Smoker  . Smokeless tobacco: Never Used  Substance Use Topics  . Alcohol use: Yes    Comment: rare  . Drug use: No     Allergies   Ciprofloxacin and Sulfa antibiotics   Review of Systems Review of Systems  Constitutional: Negative for fever.  Respiratory: Positive for cough and shortness of breath. Negative for wheezing.   Cardiovascular: Positive for chest pain. Negative for palpitations and leg swelling.  Gastrointestinal: Negative for abdominal pain, nausea and vomiting.  Genitourinary: Negative for dysuria.  All other systems reviewed and are negative.    Physical Exam Updated Vital Signs BP 118/81   Pulse 65   Temp 98.7 F (37.1 C) (Oral)   Resp 18   Ht 5\' 6"  (1.676 m)   Wt 77.1 kg (170 lb)   SpO2 99%   BMI 27.44 kg/m   Physical Exam  Constitutional: She is oriented to person, place, and time. She appears well-developed and well-nourished. She does not appear ill.  HENT:  Head: Normocephalic and atraumatic.  Eyes: Pupils are equal, round, and reactive to light.  Neck: Neck supple.  Cardiovascular: Normal rate, regular rhythm and normal heart sounds.  No murmur heard. No chest  wall tenderness to palpation  Pulmonary/Chest: Effort normal. No respiratory distress. She has no wheezes.  Abdominal: Soft. Bowel sounds are normal.  Musculoskeletal:       Right lower leg: She exhibits no edema.       Left lower leg: She exhibits no edema.  Neurological: She is alert and oriented to person, place, and time.  Skin: Skin is warm and dry.  Psychiatric: She has a normal mood and affect.  Nursing note and vitals reviewed.    ED Treatments / Results  Labs (all labs ordered are listed, but only abnormal results are displayed) Labs Reviewed  BASIC METABOLIC PANEL - Abnormal; Notable for the following components:  Result Value   Potassium 3.3 (*)    All other components within normal limits  CBC - Abnormal; Notable for the following components:   Hemoglobin 11.1 (*)    HCT 35.6 (*)    RDW 15.9 (*)    All other components within normal limits  TROPONIN I  D-DIMER, QUANTITATIVE (NOT AT [facility])  TROPONIN I    EKG  EKG Interpretation  Date/Time:  Sunday January 04 2018 20:01:35 EST Ventricular Rate:  73 PR Interval:  160 QRS Duration: 78 QT Interval:  384 QTC Calculation: 423 R Axis:   81 Text Interpretation:  Normal sinus rhythm Normal ECG Confirmed by Thayer Jew [phone]) on 01/04/2018 11:54:24 PM       Radiology Dg Chest 2 View  Result Date: 01/04/2018 CLINICAL DATA:  67 year old female with chest pain and shortness of breath. EXAM: CHEST  2 VIEW COMPARISON:  Chest radiograph dated 04/06/2016 FINDINGS: The heart size and mediastinal contours are within normal limits. Both lungs are clear. The visualized skeletal structures are unremarkable. IMPRESSION: No active cardiopulmonary disease. Electronically Signed   By: Anner Crete M.D.   On: 01/04/2018 21:26    Procedures Procedures (including critical care time)  Medications Ordered in ED Medications - No data to display   Initial Impression / Assessment and Plan / ED Course  I have reviewed  the triage vital signs and the nursing notes.  Pertinent labs & imaging results that were available during my care of the patient were reviewed by me and considered in my medical decision making (see chart for details).     Patient presents with chest pain.  Fairly atypical in nature.  She is nontoxic appearing.  Vital signs are reassuring including blood pressure.  Initial EKG is normal with a normal troponin.  Doubt ACS.  Some features suggestive of reflux but no association with food.  D-dimer was sent to rule out blood clot.  This is negative.  Patient does report some upper respiratory symptoms.  Chest x-ray shows no evidence of pneumonia.  Discussed workup with the patient.  She is reassured.  Recommend cardiology follow-up for definitive stress testing.  Continue Prilosec.  After history, exam, and medical workup I feel the patient has been appropriately medically screened and is safe for discharge home. Pertinent diagnoses were discussed with the patient. Patient was given return precautions.   Final Clinical Impressions(s) / ED Diagnoses   Final diagnoses:  Atypical chest pain    ED Discharge Orders    None       Meredyth Hornung, Barbette Hair, MD 01/05/18 [phone]

## 2018-04-07 ENCOUNTER — Other Ambulatory Visit: Payer: Self-pay | Admitting: Family Medicine

## 2018-04-07 DIAGNOSIS — N644 Mastodynia: Secondary | ICD-10-CM

## 2018-04-14 ENCOUNTER — Ambulatory Visit
Admission: RE | Admit: 2018-04-14 | Discharge: 2018-04-14 | Disposition: A | Discharge status: Home / Self Care | Payer: Federal, State, Local not specified - PPO | Source: Ambulatory Visit | Attending: Family Medicine | Admitting: Family Medicine

## 2018-04-14 ENCOUNTER — Telehealth: Payer: Self-pay | Admitting: Orthopedic Surgery

## 2018-04-14 DIAGNOSIS — N644 Mastodynia: Secondary | ICD-10-CM
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
## 2018-05-29 ENCOUNTER — Encounter (HOSPITAL_COMMUNITY): Payer: Self-pay

## 2018-05-29 ENCOUNTER — Emergency Department (HOSPITAL_COMMUNITY)
Admission: EM | Admit: 2018-05-29 | Discharge: 2018-05-30 | Disposition: A | Discharge status: Home / Self Care | Payer: Federal, State, Local not specified - PPO | Attending: Emergency Medicine | Admitting: Emergency Medicine

## 2018-05-29 ENCOUNTER — Other Ambulatory Visit: Payer: Self-pay

## 2018-05-29 DIAGNOSIS — E860 Dehydration: Secondary | ICD-10-CM | POA: Insufficient documentation

## 2018-05-29 DIAGNOSIS — Z7982 Long term (current) use of aspirin: Secondary | ICD-10-CM | POA: Insufficient documentation

## 2018-05-29 DIAGNOSIS — Z79899 Other long term (current) drug therapy: Secondary | ICD-10-CM | POA: Insufficient documentation

## 2018-05-29 DIAGNOSIS — I10 Essential (primary) hypertension: Secondary | ICD-10-CM | POA: Insufficient documentation

## 2018-05-29 DIAGNOSIS — R42 Dizziness and giddiness: Secondary | ICD-10-CM

## 2018-05-29 DIAGNOSIS — R197 Diarrhea, unspecified: Secondary | ICD-10-CM

## 2018-05-29 DIAGNOSIS — R55 Syncope and collapse: Secondary | ICD-10-CM | POA: Diagnosis present

## 2018-05-29 LAB — CBC
HCT: 37.3 % (ref 36.0–46.0)
Hemoglobin: 11.2 g/dL — ABNORMAL LOW (ref 12.0–15.0)
MCH: 25.9 pg — ABNORMAL LOW (ref 26.0–34.0)
MCHC: 30 g/dL (ref 30.0–36.0)
MCV: 86.1 fL (ref 78.0–100.0)
Platelets: 225 10*3/uL (ref 150–400)
RBC: 4.33 MIL/uL (ref 3.87–5.11)
RDW: 14.9 % (ref 11.5–15.5)
WBC: 5.9 10*3/uL (ref 4.0–10.5)

## 2018-05-29 LAB — COMPREHENSIVE METABOLIC PANEL
ALT: 20 U/L (ref 0–44)
AST: 27 U/L (ref 15–41)
Albumin: 3.6 g/dL (ref 3.5–5.0)
Alkaline Phosphatase: 80 U/L (ref 38–126)
Anion gap: 8 (ref 5–15)
BUN: 13 mg/dL (ref 8–23)
CO2: 29 mmol/L (ref 22–32)
Calcium: 9 mg/dL (ref 8.9–10.3)
Chloride: 104 mmol/L (ref 98–111)
Creatinine, Ser: 0.92 mg/dL (ref 0.44–1.00)
GFR calc Af Amer: >60 mL/min (ref >60)
GFR calc non Af Amer: >60 mL/min (ref >60)
Glucose, Bld: 137 mg/dL — ABNORMAL HIGH (ref 70–99)
Potassium: 3.7 mmol/L (ref 3.5–5.1)
Sodium: 141 mmol/L (ref 135–145)
Total Bilirubin: 0.3 mg/dL (ref 0.3–1.2)
Total Protein: 6.9 g/dL (ref 6.5–8.1)

## 2018-05-29 LAB — LIPASE, BLOOD: Lipase: 38 U/L (ref 11–51)

## 2018-05-29 LAB — MAGNESIUM: Magnesium: 2.3 mg/dL (ref 1.7–2.4)

## 2018-05-29 MED ORDER — ONDANSETRON HCL 4 MG/2ML IJ SOLN
4.0000 mg | Freq: Once | INTRAMUSCULAR | Status: AC
  Administered 2018-05-29: 4 mg via INTRAVENOUS
  Filled 2018-05-29: qty 2

## 2018-05-29 MED ORDER — SODIUM CHLORIDE 0.9 % IV BOLUS
1000.0000 mL | Freq: Once | INTRAVENOUS | Status: AC
  Administered 2018-05-29: 1000 mL via INTRAVENOUS

## 2018-05-29 MED ORDER — FENTANYL CITRATE (PF) 100 MCG/2ML IJ SOLN
50.0000 ug | Freq: Once | INTRAMUSCULAR | Status: AC
  Administered 2018-05-29: 50 ug via INTRAVENOUS
  Filled 2018-05-29: qty 2

## 2018-05-29 NOTE — ED Triage Notes (Signed)
Patient from home by EMS for hypotension that happened after patient stood up and fell getting to the phone. Stated she is having abdominal ain as well after eating [employer]. Initial B for EMS was 60 systolic, after 631 cc fluids BP 112/64.  A&Ox4

## 2018-05-29 NOTE — ED Provider Notes (Signed)
[facility] Provider Note   CSN: [id number] Arrival date & time: 05/29/18  1952     History   Chief Complaint Chief Complaint  Patient presents with  . Near Syncope  . Abdominal Pain    HPI Margaret [name] is a 67 y.o. female.  The history is provided by the patient, medical records and a relative. No language interpreter was used.  Near Syncope  This is a new problem. The current episode started less than 1 hour ago. The problem occurs rarely. The problem has been resolved. Associated symptoms include abdominal pain (resolved). Pertinent negatives include no chest pain, no headaches and no shortness of breath. Nothing aggravates the symptoms. Nothing relieves the symptoms. She has tried nothing for the symptoms. The treatment provided no relief.    Past Medical History:  Diagnosis Date  . Allergy   . Anemia   . Dyspareunia   . Endometriosis   . High cholesterol   . Hx of abnormal Pap smear 1980's  . Hypertension   . Osteopenia   . Ulcer   . Urinary incontinence     Patient Active Problem List   Diagnosis Date Noted  . Closed nondisplaced fracture of proximal phalanx of lesser toe of left foot 11/19/2017  . GI bleed 10/23/2017  . Hypokalemia 10/23/2017  . Nausea vomiting and diarrhea 10/23/2017  . Hematochezia 10/23/2017  . Back pain 04/12/2014  . Arthritis 04/12/2014  . Knee pain 04/12/2014  . Essential hypertension 11/27/2013  . Osteoarthritis of left knee 07/16/2012    Past Surgical History:  Procedure Laterality Date  . ABDOMINAL HYSTERECTOMY  09/2008   TVH/BSO--Dr. Quincy Simmonds with TVT  . BLADDER SUSPENSION  09/2008   Dr. Quincy Simmonds  . CERVIX LESION DESTRUCTION  1980   hx abnormal pap--dysplasia  . COLONOSCOPY WITH PROPOFOL Left 10/25/2017   Procedure: COLONOSCOPY WITH PROPOFOL;  Surgeon: Ronnette Juniper, MD;  Location: [facility];  Service: Gastroenterology;  Laterality: Left;     OB History    Gravida  3   Para   3   Term  3   Preterm      AB      Living  3     SAB      TAB      Ectopic      Multiple      Live Births               Home Medications    Prior to Admission medications   Medication Sig Start Date End Date Taking? Authorizing Provider  OVER THE COUNTER MEDICATION Apply 1 application topically daily as needed (knee pain). MAGNESIUM OIL APPLIED TO KNEE FOR ARTHRITIS PAIN   Yes [provider]  aspirin 81 MG tablet Take 162 mg by mouth daily.    [provider]  Cholecalciferol (VITAMIN D) 2000 UNITS CAPS Take 2,000 Units by mouth daily.     [provider]  Ferrous Sulfate (IRON) 325 (65 Fe) MG TABS Take by mouth daily.    [provider]  fexofenadine (ALLEGRA) 180 MG tablet Take 180 mg by mouth daily.    [provider]  fluticasone (FLONASE) 50 MCG/ACT nasal spray Place 1 spray into both nostrils daily as needed for allergies or rhinitis.     [provider]  HYDROcodone-acetaminophen (NORCO) 5-325 MG tablet Take 1 tablet by mouth every 6 (six) hours as needed. 10/15/17   Julianne Rice, MD  lisinopril-hydrochlorothiazide (PRINZIDE,ZESTORETIC) 10-12.5 MG per tablet  Take 1 tablet by mouth daily. 05/05/15   [provider]  Multiple Vitamins-Minerals (MULTIVITAMIN ADULT PO) Take 1 tablet by mouth daily.    [provider]  traMADol (ULTRAM) 50 MG tablet Take 1 tablet (50 mg total) by mouth every 6 (six) hours as needed for severe pain. 10/25/17 10/25/18  Lavina Hamman, MD    Family History Family History  Problem Relation Age of Onset  . Diabetes Mother   . Hypertension Mother   . Thyroid disease Sister   . Seizures Brother   . Diabetes Brother   . Hypertension Brother   . Diabetes Brother   . Arthritis Unknown   . Diabetes Unknown   . Breast cancer Cousin     Social History Social History   Tobacco Use  . Smoking status: Never Smoker  . Smokeless tobacco: Never Used  Substance  Use Topics  . Alcohol use: Yes    Comment: rare  . Drug use: No     Allergies   Ciprofloxacin and Sulfa antibiotics   Review of Systems Review of Systems  Constitutional: Positive for fatigue. Negative for chills, diaphoresis and fever.  HENT: Negative for congestion and rhinorrhea.   Respiratory: Negative for cough, chest tightness, shortness of breath and wheezing.   Cardiovascular: Positive for near-syncope. Negative for chest pain, palpitations and leg swelling.  Gastrointestinal: Positive for abdominal pain (resolved). Negative for constipation, diarrhea, nausea and vomiting.  Genitourinary: Negative for dysuria and frequency.  Musculoskeletal: Negative for back pain, neck pain and neck stiffness.  Skin: Negative for rash and wound.  Neurological: Positive for light-headedness. Negative for dizziness, tremors, seizures, facial asymmetry, speech difficulty, weakness, numbness and headaches.  Psychiatric/Behavioral: Negative for agitation.  All other systems reviewed and are negative.    Physical Exam Updated Vital Signs BP 117/70   Pulse 77   Temp 98.2 F (36.8 C) (Oral)   Resp (!) 8   Ht 5\' 6"  (1.676 m)   Wt 78 kg (172 lb)   SpO2 100%   BMI 27.76 kg/m   Physical Exam  Constitutional: She is oriented to person, place, and time. She appears well-developed and well-nourished. She does not appear ill. No distress.  HENT:  Head: Normocephalic and atraumatic.  Mouth/Throat: Oropharynx is clear and moist. No oropharyngeal exudate.  Eyes: Pupils are equal, round, and reactive to light. Conjunctivae are normal.  Neck: Neck supple.  Cardiovascular: Normal rate and regular rhythm.  No murmur heard. Pulmonary/Chest: Effort normal and breath sounds normal. No stridor. No respiratory distress. She has no wheezes. She has no rales. She exhibits no tenderness.  Abdominal: Soft. There is no tenderness. There is no guarding.  Musculoskeletal: She exhibits no edema or tenderness.   Lymphadenopathy:    She has no cervical adenopathy.  Neurological: She is alert and oriented to person, place, and time. No cranial nerve deficit or sensory deficit. She exhibits normal muscle tone. Coordination normal.  Skin: Skin is warm and dry. Capillary refill takes less than 2 seconds. No rash noted. She is not diaphoretic. No erythema.  Psychiatric: She has a normal mood and affect.  Nursing note and vitals reviewed.    ED Treatments / Results  Labs (all labs ordered are listed, but only abnormal results are displayed) Labs Reviewed  COMPREHENSIVE METABOLIC PANEL - Abnormal; Notable for the following components:      Result Value   Glucose, Bld 137 (*)    All other components within normal limits  CBC - Abnormal;  Notable for the following components:   Hemoglobin 11.2 (*)    MCH 25.9 (*)    All other components within normal limits  LIPASE, BLOOD  URINALYSIS, ROUTINE W REFLEX MICROSCOPIC  MAGNESIUM    EKG EKG Interpretation  Date/Time:  Friday May 29 2018 19:57:45 EDT Ventricular Rate:  68 PR Interval:    QRS Duration: 88 QT Interval:  417 QTC Calculation: 444 R Axis:   55 Text Interpretation:  sinus rhythm. When compared to prior, no signifiacnt changes.  No STEMI Confirmed by Antony Blackbird [phone]) on 05/29/2018 9:27:29 PM   Radiology No results found.  Procedures Procedures (including critical care time)  Medications Ordered in ED Medications  sodium chloride 0.9 % bolus 1,000 mL (0 mLs Intravenous Stopped 05/29/18 2222)  ondansetron (ZOFRAN) injection 4 mg (4 mg Intravenous Given 05/29/18 2121)  fentaNYL (SUBLIMAZE) injection 50 mcg (50 mcg Intravenous Given 05/29/18 2121)     Initial Impression / Assessment and Plan / ED Course  I have reviewed the triage vital signs and the nursing notes.  Pertinent labs & imaging results that were available during my care of the patient were reviewed by me and considered in my medical decision making (see chart for  details).     Margaret [name] is a 67 y.o. female with a past medical history significant for hypertension,  Osteoarthritis, endometriosis, and reported prior colitis who presents with a near syncopal episode.  Patient reports that she has not been eating or drinking as much over the last several days and think she is dehydrated.  She says she had decreased urination.  She reports no conservation or diarrhea but reports that today while at [facility], patient stood up quickly and began feeling lightheaded.  She reports that she went to the ground but did not pass out.  She was near syncopal.  She reports this episode lasted for several minutes and then improved.  She reports that she then went to the bathroom, had some mild to moderate abdominal cramping and had diarrhea.  She has had several episodes of diarrhea that have been nonbloody since then.  She continues to have no fevers, chills, chest pain, shortness breath, palpitations.  She says this does not feel like when she had the colitis in the past and thinks she is just dehydrated.  Next  According to EMS report to nursing, patient's blood pressure was hypotensive during transport but after a small amount of fluids it normalized.  She currently is just feeling slightly fatigued and has some mild nausea.  Next  On exam, abdomen is completely nontender.  Low suspicion for diverticulitis, obstruction, or other significant intra-abdominal pathology.  Lungs were clear and chest was nontender.  No CVA tenderness present.  Patient appears well on exam.  Patient had a metabolic panel showing normal kidney and liver function.  Letter lites reassuring.  CBC shows no leukocytosis and similar anemia to prior.  Normal magnesium and lipase were normal.  No evidence of STEMI or arrhythmia on EKG.  Given the decreased urination, patient is awaiting urinalysis.  Patient was given fluids for likely dehydration contributing to her near syncopal  episode.  After fluids patient is feeling much better.  Anticipate discharge home if urinalysis does not show UTI.   12:35 AM Urinalysis showed no evidence of infection.  Suspect the diarrhea and dehydration over the last several days contribute to her near syncopal episode.  Patient has appeared well during her.  Given the lack of abdominal tenderness or  current pain, do not feel she needs CT scan of her abdomen.  Patient advised to follow-up with her PCP in several days and she understood return precautions.  Patient given nausea medicine to help maintain hydration.  Patient and other worsens or concerns and was discharged in good condition.   Final Clinical Impressions(s) / ED Diagnoses   Final diagnoses:  Dehydration  Near syncope  Lightheaded  Diarrhea, unspecified type    ED Discharge Orders        Ordered    ondansetron (ZOFRAN) 4 MG tablet  Every 8 hours PRN     05/30/18 0034      Clinical Impression: 1. Dehydration   2. Near syncope   3. Lightheaded   4. Diarrhea, unspecified type     Disposition: Discharge  Condition: Good  I have discussed the results, Dx and Tx plan with the pt(& family if present). He/she/they expressed understanding and agree(s) with the plan. Discharge instructions discussed at great length. Strict return precautions discussed and pt &/or family have verbalized understanding of the instructions. No further questions at time of discharge.    New Prescriptions   ONDANSETRON (ZOFRAN) 4 MG TABLET    Take 1 tablet (4 mg total) by mouth every 8 (eight) hours as needed.    Follow Up: Cari Caraway, [place] [address] [place] [address] [phone] mc [place]       Tegeler, Gwenyth Allegra, MD 05/30/18 [phone]

## 2018-05-30 LAB — URINALYSIS, ROUTINE W REFLEX MICROSCOPIC
Bilirubin Urine: NEGATIVE
Glucose, UA: NEGATIVE mg/dL
Hgb urine dipstick: NEGATIVE
Ketones, ur: NEGATIVE mg/dL
Leukocytes, UA: NEGATIVE
Nitrite: NEGATIVE
Protein, ur: NEGATIVE mg/dL
Specific Gravity, Urine: 1.01 (ref 1.005–1.030)
pH: 5 (ref 5.0–8.0)

## 2018-05-30 MED ORDER — ONDANSETRON HCL 4 MG PO TABS: 4.0000 mg | ORAL_TABLET | Freq: Three times a day (TID) | ORAL | 0 refills | Status: DC | PRN

## 2018-05-30 NOTE — Discharge Instructions (Signed)
We suspect you being dehydrated contribute to your lightheaded episode today.  We also suspect you became dehydrated after the diarrhea.  Please stay hydrated and use the nausea medicine to help with any nausea.  Please follow-up with your primary doctor in several days.  If any symptoms change or worsen, please return to the nearest emergency department.

## 2018-05-30 NOTE — ED Notes (Signed)
50 mcg fentanyl wasted in sink with Ephriam Jenkins. RN

## 2018-05-30 NOTE — ED Notes (Signed)
Patient Alert and oriented to baseline. Stable and ambulatory to baseline. Patient verbalized understanding of the discharge instructions.  Patient belongings were taken by the patient.   

## 2018-06-04 ENCOUNTER — Encounter: Payer: Self-pay | Admitting: Orthopaedic Surgery

## 2018-06-04 ENCOUNTER — Ambulatory Visit (INDEPENDENT_AMBULATORY_CARE_PROVIDER_SITE_OTHER): Payer: Federal, State, Local not specified - PPO

## 2018-06-04 ENCOUNTER — Ambulatory Visit: Payer: Federal, State, Local not specified - PPO | Admitting: Orthopaedic Surgery

## 2018-06-04 VITALS — BP 143/87 | HR 78 | Ht 65.5 in | Wt 171.0 lb

## 2018-06-04 DIAGNOSIS — M25511 Pain in right shoulder: Secondary | ICD-10-CM

## 2018-06-04 DIAGNOSIS — M79621 Pain in right upper arm: Secondary | ICD-10-CM

## 2018-06-04 MED ORDER — HYDROCODONE-ACETAMINOPHEN 5-325 MG PO TABS: ORAL_TABLET | ORAL | 0 refills | Status: DC

## 2018-06-04 NOTE — Progress Notes (Signed)
Patient Margaret [name], female DOB:04-12-1951, 67 y.o. ZGY:[id number]  Chief Complaint  Patient presents with  . New Patient (Initial Visit)    Right shoulder and Right leg pain and tingling    HPI  Margaret [name] is a 67 y.o. female who had an IV in the right arm near the biceps tendon area in December of 2018.  They had a difficult time in inserting the IV and since then she has had problems with the right elbow and distal biceps area. She has had swelling and deep pain.  The pain bothers her most of the time, more after activity.  She has had no redness.  She has begun to have pain develop in the right shoulder made worse with overhead use.  She has no numbness, no redness, no other trauma.  She is tired of it hurting.  She has tried ice, heat and ibuprofen with only temporary help.  She saw her family doctor at [facility], Dr. Radene Ou, and referred here.  I have reviewed Dr. Radene Ou' notes.   Body mass index is 28.02 kg/m.  ROS  Review of Systems  Constitutional: Negative for fever and unexpected weight change.  Gastrointestinal: Negative.   Genitourinary: Negative.     All other systems reviewed and are negative.  Past Medical History:  Diagnosis Date  . Allergy   . Anemia   . Colitis   . Dyspareunia   . Endometriosis   . GERD (gastroesophageal reflux disease)   . High cholesterol   . Hx of abnormal Pap smear 1980's  . Hypertension   . Osteopenia   . Ulcer   . Urinary incontinence     Past Surgical History:  Procedure Laterality Date  . ABDOMINAL HYSTERECTOMY  09/2008   TVH/BSO--Dr. Quincy Simmonds with TVT  . BLADDER SUSPENSION  09/2008   Dr. Quincy Simmonds  . CERVIX LESION DESTRUCTION  1980   hx abnormal pap--dysplasia  . COLONOSCOPY WITH PROPOFOL Left 10/25/2017   Procedure: COLONOSCOPY WITH PROPOFOL;  Surgeon: Ronnette Juniper, MD;  Location: [facility];  Service: Gastroenterology;  Laterality: Left;    Family History  Problem Relation Age of Onset  . Diabetes  Mother   . Hypertension Mother   . Thyroid disease Sister   . Seizures Brother   . Diabetes Brother   . Hypertension Brother   . Diabetes Brother   . Arthritis Unknown   . Diabetes Unknown   . Breast cancer Cousin     Social History Social History   Tobacco Use  . Smoking status: Never Smoker  . Smokeless tobacco: Never Used  Substance Use Topics  . Alcohol use: Yes    Comment: rare  . Drug use: No    Allergies  Allergen Reactions  . Ciprofloxacin Nausea And Vomiting  . Sulfa Antibiotics Other (See Comments)    Unknown    Current Outpatient Medications  Medication Sig Dispense Refill  . amoxicillin (AMOXIL) 500 MG capsule Take 500 mg by mouth 2 (two) times daily.   0  . Cholecalciferol (VITAMIN D) 2000 UNITS CAPS Take 2,000 Units by mouth daily.     . Ferrous Sulfate (IRON) 325 (65 Fe) MG TABS Take 325 mg by mouth daily.     . fexofenadine (ALLEGRA) 180 MG tablet Take 180 mg by mouth daily as needed for allergies.     . fluticasone (FLONASE) 50 MCG/ACT nasal spray Place 1 spray into both nostrils daily as needed for allergies or rhinitis.     Marland Kitchen HYDROcodone-acetaminophen (NORCO/VICODIN) 5-325 MG  tablet One tablet every four hours as needed for acute pain.  Limit of five days per [place] statue. 30 tablet 0  . lisinopril-hydrochlorothiazide (PRINZIDE,ZESTORETIC) 10-12.5 MG per tablet Take 1 tablet by mouth daily.  6  . Multiple Vitamins-Minerals (MULTIVITAMIN ADULT PO) Take 1 tablet by mouth daily.    Marland Kitchen omeprazole (PRILOSEC) 20 MG capsule Take 20 mg by mouth 2 (two) times daily.   3  . ondansetron (ZOFRAN) 4 MG tablet Take 1 tablet (4 mg total) by mouth every 8 (eight) hours as needed. 12 tablet 0  . OVER THE COUNTER MEDICATION Apply 1 application topically daily as needed (knee pain). MAGNESIUM OIL APPLIED TO KNEE FOR ARTHRITIS PAIN    . traMADol (ULTRAM) 50 MG tablet Take 1 tablet (50 mg total) by mouth every 6 (six) hours as needed for severe pain. (Patient not taking:  Reported on 05/29/2018) 20 tablet 0   No current facility-administered medications for this visit.      Physical Exam  Blood pressure (!) 143/87, pulse 78, height 5' 5.5" (1.664 m), weight 171 lb (77.6 kg).  Constitutional: overall normal hygiene, normal nutrition, well developed, normal grooming, normal body habitus. Assistive device:none  Musculoskeletal: gait and station Limp none, muscle tone and strength are normal, no tremors or atrophy is present.  .  Neurological: coordination overall normal.  Deep tendon reflex/nerve stretch intact.  Sensation normal.  Cranial nerves II-XII intact.   Skin:   Normal overall no scars, lesions, ulcers or rashes. No psoriasis.  Psychiatric: Alert and oriented x 3.  Recent memory intact, remote memory unclear.  Normal mood and affect. Well groomed.  Good eye contact.  Cardiovascular: overall no swelling, no varicosities, no edema bilaterally, normal temperatures of the legs and arms, no clubbing, cyanosis and good capillary refill.  Lymphatic: palpation is normal.  Her right shoulder has full motion with some pain at the extremes.  NV is intact.  She has tenderness at the biceps insertion at the elbow on the proximal radius and an area about 5 to 6 cm proximal.  She has some tenderness of the distal biceps muscle.  There is no redness or swelling apparent but she says she feels swelling.  Grips are normal, she is right hand dominant.  All other systems reviewed and are negative   The patient has been educated about the nature of the problem(s) and counseled on treatment options.  The patient appeared to understand what I have discussed and is in agreement with it.  Encounter Diagnoses  Name Primary?  . Pain in joint of right shoulder   . Pain in right upper arm Yes   X-rays of the right shoulder were done, reported separately.   PLAN Call if any problems.  Precautions discussed.  Continue current medications.   Return to clinic 2  weeks   I would like her to have about two weeks of PT to see if that helps.  I have given injection into the right shoulder to see if that helps.  She has some bursitis that could refer to the elbow area.  PROCEDURE NOTE:  The patient request injection, verbal consent was obtained.  The right shoulder was prepped appropriately after time out was performed.   Sterile technique was observed and injection of 1 cc of Depo-Medrol 40 mg with several cc's of plain xylocaine. Anesthesia was provided by ethyl chloride and a 20-gauge needle was used to inject the shoulder area. A posterior approach was used.  The injection  was tolerated well.  A band aid dressing was applied.  The patient was advised to apply ice later today and tomorrow to the injection sight as needed.  I am concerned the IV needle may have injured the biceps tendon.  If she is not improved from the injection and PT, then I would recommend a MRI of the elbow/biceps tendon distally to see if there is a partial disruption of the tendon.  The tendon is intact and I could not feel a defect but she could have a partial injury.  She understands what I am concerned about and agrees to the above.  Return in two weeks, re-evaluate.  Call if any problem.  Precautions discussed.   Electronically Signed Sanjuana Kava, MD 7/25/201910:43 AM

## 2018-06-18 ENCOUNTER — Ambulatory Visit: Payer: Federal, State, Local not specified - PPO | Admitting: Obstetrics and Gynecology

## 2018-06-22 ENCOUNTER — Ambulatory Visit: Payer: Federal, State, Local not specified - PPO | Admitting: Physical Therapy

## 2018-06-23 ENCOUNTER — Ambulatory Visit: Payer: Federal, State, Local not specified - PPO | Admitting: Orthopaedic Surgery

## 2018-06-26 ENCOUNTER — Ambulatory Visit: Payer: Federal, State, Local not specified - PPO | Admitting: Physical Therapy

## 2018-06-29 ENCOUNTER — Ambulatory Visit: Payer: Federal, State, Local not specified - PPO | Admitting: Physical Therapy

## 2018-07-02 ENCOUNTER — Other Ambulatory Visit: Payer: Self-pay

## 2018-07-02 ENCOUNTER — Encounter: Payer: Self-pay | Admitting: Physical Therapy

## 2018-07-02 ENCOUNTER — Ambulatory Visit: Payer: Federal, State, Local not specified - PPO | Attending: Orthopaedic Surgery | Admitting: Physical Therapy

## 2018-07-02 DIAGNOSIS — M25611 Stiffness of right shoulder, not elsewhere classified: Secondary | ICD-10-CM

## 2018-07-02 DIAGNOSIS — R252 Cramp and spasm: Secondary | ICD-10-CM | POA: Diagnosis present

## 2018-07-02 DIAGNOSIS — M25511 Pain in right shoulder: Secondary | ICD-10-CM | POA: Insufficient documentation

## 2018-07-02 NOTE — Therapy (Signed)
Margaret [name] [address] [place], [place], [address] Phone: [phone]   Fax:  [phone]  Physical Therapy Evaluation  Patient Details  Name: Margaret [name] MRN: [id number] Date of Birth: 06-05-1951 Referring Provider: Jinny Blossom   Encounter Date: 07/02/2018  PT End of Session - 07/02/18 0959    Visit Number  1    Date for PT Re-Evaluation  09/01/18    PT Start Time  0926    PT Stop Time  1016    PT Time Calculation (min)  50 min    Activity Tolerance  Patient tolerated treatment well    Behavior During Therapy  [facility] for tasks assessed/performed       Past Medical History:  Diagnosis Date  . Allergy   . Anemia   . Colitis   . Dyspareunia   . Endometriosis   . GERD (gastroesophageal reflux disease)   . High cholesterol   . Hx of abnormal Pap smear 1980's  . Hypertension   . Osteopenia   . Ulcer   . Urinary incontinence     Past Surgical History:  Procedure Laterality Date  . ABDOMINAL HYSTERECTOMY  09/2008   TVH/BSO--Dr. Quincy Simmonds with TVT  . BLADDER SUSPENSION  09/2008   Dr. Quincy Simmonds  . CERVIX LESION DESTRUCTION  1980   hx abnormal pap--dysplasia  . COLONOSCOPY WITH PROPOFOL Left 10/25/2017   Procedure: COLONOSCOPY WITH PROPOFOL;  Surgeon: Ronnette Juniper, MD;  Location: [facility];  Service: Gastroenterology;  Laterality: Left;    There were no vitals filed for this visit.   Subjective Assessment - 07/02/18 0926    Subjective  Patient c/o right shoulder pain since December.  She reports that she felt like an IV that she had in the hospital started bothering the right arm pain.  X-rays negative.  She reports a cortisone injection about a month ago and reports that this has decreased her pain.    Limitations  Lifting;House hold activities    Patient Stated Goals  have less pain    Currently in Pain?  Yes    Pain Score  1     Pain Location  Shoulder    Pain Orientation  Right    Pain Descriptors  / Indicators  Aching    Pain Type  Acute pain    Pain Radiating Towards  pain in the biceps area    Pain Onset  More than a month ago    Pain Frequency  Intermittent    Aggravating Factors   reports that she is unsure of a cause, reports that it comes and goes on its own pain up to 7/10    Pain Relieving Factors  reports pain can be 0/10 reports that a cortisone injection helped    Effect of Pain on Daily Activities  reports just hurts         [facility] PT Assessment - 07/02/18 0001      Assessment   Medical Diagnosis  right shoulder pain    Referring Provider  W. Luna Glasgow    Onset Date/Surgical Date  06/01/18    Hand Dominance  Right    Prior Therapy  no      Precautions   Precautions  None      Balance Screen   Has the patient fallen in the past 6 months  No    Has the patient had a decrease in activity level because of a fear of falling?   No  Is the patient reluctant to leave their home because of a fear of falling?   No      Home Environment   Additional Comments  does housework, mild gardening      Prior Function   Level of Independence  Independent    Vocation  Full time employment    Vocation Requirements  sitting at computer    Leisure  yoga 1x/week, some small hand weights      Posture/Postural Control   Posture Comments  fwd head, rounded shoulders      ROM / Strength   AROM / PROM / Strength  AROM;PROM;Strength      AROM   AROM Assessment Site  Shoulder    Right/Left Shoulder  Right    Right Shoulder Flexion  152 Degrees    Right Shoulder ABduction  141 Degrees    Right Shoulder Internal Rotation  45 Degrees    Right Shoulder External Rotation  85 Degrees      PROM   Overall PROM Comments  painful IR to 60      Strength   Overall Strength Comments  4+/5 mild pain anterior right shoulder      Flexibility   Soft Tissue Assessment /Muscle Length  --   tight pecs and tight medial nerve      Palpation   Palpation comment  she is very tender in the  right biceps origin and tender in the right deltoid area      Special Tests   Other special tests  + impingement, + neural tension                Objective measurements completed on examination: See above findings.      [place] Adult PT Treatment/Exercise - 07/02/18 0001      Modalities   Modalities  Electrical Stimulation;Moist Heat      Moist Heat Therapy   Number Minutes Moist Heat  15 Minutes    Moist Heat Location  Shoulder      Electrical Stimulation   Electrical Stimulation Location  right shoulder and biceps area, she was really fleared up after the testing and motions of the evaluation    Electrical Stimulation Action  IFC    Electrical Stimulation Parameters  supine    Electrical Stimulation Goals  Pain             PT Education - 07/02/18 0958    Education Details  shoulder shrugs, IR passive stretch and UE neural tension stretches    Person(s) Educated  Patient    Methods  Explanation;Demonstration;Handout;Tactile cues;Verbal cues    Comprehension  Verbalized understanding;Returned demonstration       PT Short Term Goals - 07/02/18 1008      PT SHORT TERM GOAL #1   Title  independent with initial HEP    Time  2    Period  Weeks    Status  New        PT Long Term Goals - 07/02/18 1008      PT LONG TERM GOAL #1   Title  understand proper posture and body mechanics     Time  8    Period  Weeks    Status  New      PT LONG TERM GOAL #2   Title  Pt will be I with HEP  flexibility.     Time  8    Period  Weeks    Status  New  PT LONG TERM GOAL #3   Title  increase right shouldre IR to 70 degrees    Time  8    Period  Weeks    Status  New      PT LONG TERM GOAL #4   Title  report pain decreased 50% at work    Time  [place] - 07/02/18 1005    Clinical Impression Statement  Patient reports right shoulder pain since December after she reports having an IV that "did not feel  right", she reports protecting the arm due to pain for a period after this.  She reports that x-rays are negative.  She had minimal pain prior to the evaluation but after the ROM and testing she had increased right biceps and shoulder pain.  Her AROM was most limited with IR, she has a lot of gaurding and spasms in the shoulder and upper traps as well as the biceps area, she is very tender int he biceps, has + impingment and neural tension tests.    Clinical Presentation  Stable    Clinical Decision Making  Low    Rehab Potential  Good    PT Frequency  2x / week    PT Duration  8 weeks    PT Treatment/Interventions  Cryotherapy;Electrical Stimulation;Iontophoresis 4mg /ml Dexamethasone;Moist Heat;Ultrasound;Therapeutic exercise;Therapeutic activities;Patient/family education;Manual techniques;Vasopneumatic Device;Dry needling    PT Next Visit Plan  assure that HEP is okay, look to help with posture and scapular stabilization    Consulted and Agree with Plan of Care  Patient       Patient will benefit from skilled therapeutic intervention in order to improve the following deficits and impairments:  Decreased range of motion, Increased fascial restricitons, Impaired UE functional use, Increased muscle spasms, Pain, Improper body mechanics, Impaired flexibility, Decreased strength  Visit Diagnosis: Acute pain of right shoulder - Plan: PT plan of care cert/re-cert  Stiffness of right shoulder, not elsewhere classified - Plan: PT plan of care cert/re-cert  Cramp and spasm - Plan: PT plan of care cert/re-cert     Problem List Patient Active Problem List   Diagnosis Date Noted  . Closed nondisplaced fracture of proximal phalanx of lesser toe of left foot 11/19/2017  . GI bleed 10/23/2017  . Hypokalemia 10/23/2017  . Nausea vomiting and diarrhea 10/23/2017  . Hematochezia 10/23/2017  . Back pain 04/12/2014  . Arthritis 04/12/2014  . Knee pain 04/12/2014  . Essential hypertension 11/27/2013   . Osteoarthritis of left knee 07/16/2012    Sumner Boast., PT 07/02/2018, 10:15 AM  [address] [place], [place], [address] Phone: [phone]   Fax:  [phone]  Name: River Mckercher MRN: [id number] Date of Birth: 08-Aug-1951

## 2018-07-03 ENCOUNTER — Ambulatory Visit: Payer: Federal, State, Local not specified - PPO | Admitting: Physical Therapy

## 2018-07-10 ENCOUNTER — Ambulatory Visit: Payer: Federal, State, Local not specified - PPO | Admitting: Physical Therapy

## 2018-07-10 ENCOUNTER — Encounter: Payer: Self-pay | Admitting: Physical Therapy

## 2018-07-10 DIAGNOSIS — M25511 Pain in right shoulder: Secondary | ICD-10-CM | POA: Diagnosis not present

## 2018-07-10 DIAGNOSIS — R252 Cramp and spasm: Secondary | ICD-10-CM

## 2018-07-10 DIAGNOSIS — M25611 Stiffness of right shoulder, not elsewhere classified: Secondary | ICD-10-CM

## 2018-07-10 NOTE — Therapy (Signed)
[place] [place], [place], [address] Phone: [phone]   Fax:  [phone]  Physical Therapy Treatment  Patient Details  Name: Margaret [name] MRN: [id number] Date of Birth: 04-10-1951 Referring Provider: Jinny Blossom   Encounter Date: 07/10/2018  PT End of Session - 07/10/18 0913    Visit Number  2    Date for PT Re-Evaluation  09/01/18    PT Start Time  0848    PT Stop Time  0935    PT Time Calculation (min)  47 min       Past Medical History:  Diagnosis Date  . Allergy   . Anemia   . Colitis   . Dyspareunia   . Endometriosis   . GERD (gastroesophageal reflux disease)   . High cholesterol   . Hx of abnormal Pap smear 1980's  . Hypertension   . Osteopenia   . Ulcer   . Urinary incontinence     Past Surgical History:  Procedure Laterality Date  . ABDOMINAL HYSTERECTOMY  09/2008   TVH/BSO--Dr. Quincy Simmonds with TVT  . BLADDER SUSPENSION  09/2008   Dr. Quincy Simmonds  . CERVIX LESION DESTRUCTION  1980   hx abnormal pap--dysplasia  . COLONOSCOPY WITH PROPOFOL Left 10/25/2017   Procedure: COLONOSCOPY WITH PROPOFOL;  Surgeon: Ronnette Juniper, MD;  Location: [facility];  Service: Gastroenterology;  Laterality: Left;    There were no vitals filed for this visit.  Subjective Assessment - 07/10/18 0849    Subjective  much better, estim helped- pain comes and goes    Currently in Pain?  Yes    Pain Score  4     Pain Orientation  Right                       [place] Adult PT Treatment/Exercise - 07/10/18 0001      Exercises   Exercises  Neck;Shoulder      Neck Exercises: Machines for Strengthening   UBE (Upper Arm Bike)  L 3 2 fwd/2 back    Cybex Row  15# 2 sets 10    Lat Pull  15# 2 sets 10      Neck Exercises: Theraband   Scapula Retraction  10 reps;Red    Shoulder Extension  10 reps;Red    Shoulder ADduction  10 reps;Red    Shoulder External Rotation  10 reps;Red      Modalities   Modalities  Electrical Stimulation;Moist Heat      Moist Heat Therapy   Number Minutes Moist Heat  15 Minutes    Moist Heat Location  Shoulder      Electrical Stimulation   Electrical Stimulation Location  right shoulder and biceps area, she was really fleared up after the testing and motions of the evaluation    Electrical Stimulation Action  IFC    Electrical Stimulation Parameters  supine    Electrical Stimulation Goals  Pain      Manual Therapy   Manual Therapy  Neural Stretch;Passive ROM    Manual therapy comments  pain and tightness very end range but motion is good, median nerve tightness with NT stretches    Passive ROM  RT UE    Neural Stretch  RT UE             PT Education - 07/10/18 0906    Education Details  reviewed HEP from eval and added scap stab    Person(s) Educated  Patient    Methods  Explanation;Demonstration;Handout    Comprehension  Verbalized understanding;Returned demonstration       PT Short Term Goals - 07/10/18 0913      PT SHORT TERM GOAL #1   Title  independent with initial HEP    Status  Achieved        PT Long Term Goals - 07/02/18 1008      PT LONG TERM GOAL #1   Title  understand proper posture and body mechanics     Time  8    Period  Weeks    Status  New      PT LONG TERM GOAL #2   Title  Pt will be I with HEP  flexibility.     Time  8    Period  Weeks    Status  New      PT LONG TERM GOAL #3   Title  increase right shouldre IR to 70 degrees    Time  8    Period  Weeks    Status  New      PT LONG TERM GOAL #4   Title  report pain decreased 50% at work    Time  8    Period  Weeks    Status  New            Plan - 07/10/18 0913    Clinical Impression Statement  STG met. pt tolerated initial ex progression well. cuing needed for posture . PROM WNLs with some end range tightnes and pain. positive NT and responded well to flosssing and reviewed need to do at home    PT Treatment/Interventions   Other (comment)    PT Next Visit Plan  assess and progress with posture ex       Patient will benefit from skilled therapeutic intervention in order to improve the following deficits and impairments:  Decreased range of motion, Increased fascial restricitons, Impaired UE functional use, Increased muscle spasms, Pain, Improper body mechanics, Impaired flexibility, Decreased strength  Visit Diagnosis: Acute pain of right shoulder  Stiffness of right shoulder, not elsewhere classified  Cramp and spasm     Problem List Patient Active Problem List   Diagnosis Date Noted  . Closed nondisplaced fracture of proximal phalanx of lesser toe of left foot 11/19/2017  . GI bleed 10/23/2017  . Hypokalemia 10/23/2017  . Nausea vomiting and diarrhea 10/23/2017  . Hematochezia 10/23/2017  . Back pain 04/12/2014  . Arthritis 04/12/2014  . Knee pain 04/12/2014  . Essential hypertension 11/27/2013  . Osteoarthritis of left knee 07/16/2012    Merilee Wible,ANGIE PTA 07/10/2018, 9:16 AM  [address] [address] [place], [place], [address] Phone: [phone]   Fax:  [phone]  Name: Dorethea Strubel MRN: [id number] Date of Birth: [birth year]

## 2018-07-16 ENCOUNTER — Ambulatory Visit: Payer: Federal, State, Local not specified - PPO | Admitting: Orthopaedic Surgery

## 2018-07-16 ENCOUNTER — Ambulatory Visit: Payer: Federal, State, Local not specified - PPO | Admitting: Obstetrics and Gynecology

## 2018-07-24 ENCOUNTER — Encounter: Payer: Self-pay | Admitting: Physical Therapy

## 2018-07-24 ENCOUNTER — Ambulatory Visit: Payer: Federal, State, Local not specified - PPO | Attending: Orthopaedic Surgery | Admitting: Physical Therapy

## 2018-07-24 DIAGNOSIS — M25611 Stiffness of right shoulder, not elsewhere classified: Secondary | ICD-10-CM | POA: Diagnosis present

## 2018-07-24 DIAGNOSIS — M25511 Pain in right shoulder: Secondary | ICD-10-CM | POA: Diagnosis not present

## 2018-07-24 DIAGNOSIS — R252 Cramp and spasm: Secondary | ICD-10-CM | POA: Insufficient documentation

## 2018-07-24 NOTE — Therapy (Signed)
[place], [place], [address] Phone: [phone]   Fax:  [phone]  Physical Therapy Treatment  Patient Details  Name: Margaret [name] MRN: [id number] Date of Birth: [birth year] Referring Provider: Jinny Blossom   Encounter Date: 07/24/2018  PT End of Session - 07/24/18 1014    Visit Number  3    Date for PT Re-Evaluation  09/01/18    PT Start Time  0930    PT Stop Time  1028    PT Time Calculation (min)  58 min    Activity Tolerance  Patient tolerated treatment well    Behavior During Therapy  [facility] for tasks assessed/performed       Past Medical History:  Diagnosis Date  . Allergy   . Anemia   . Colitis   . Dyspareunia   . Endometriosis   . GERD (gastroesophageal reflux disease)   . High cholesterol   . Hx of abnormal Pap smear 1980's  . Hypertension   . Osteopenia   . Ulcer   . Urinary incontinence     Past Surgical History:  Procedure Laterality Date  . ABDOMINAL HYSTERECTOMY  09/2008   TVH/BSO--Dr. Quincy Simmonds with TVT  . BLADDER SUSPENSION  09/2008   Dr. Quincy Simmonds  . CERVIX LESION DESTRUCTION  1980   hx abnormal pap--dysplasia  . COLONOSCOPY WITH PROPOFOL Left 10/25/2017   Procedure: COLONOSCOPY WITH PROPOFOL;  Surgeon: Ronnette Juniper, MD;  Location: [facility];  Service: Gastroenterology;  Laterality: Left;    There were no vitals filed for this visit.  Subjective Assessment - 07/24/18 0929    Subjective  "Like I said Im feeling much better" Pt reports some pain in her R arm.     Currently in Pain?  Yes    Pain Score  4     Pain Location  Shoulder    Pain Orientation  Right                       [place] Adult PT Treatment/Exercise - 07/24/18 0001      Exercises   Exercises  Neck;Shoulder      Neck Exercises: Machines for Strengthening   UBE (Upper Arm Bike)  L 3 2 fwd/2 back    Cybex Row  15# 2 sets 10    Lat Pull  15# 2 sets 10      Neck Exercises:  Theraband   Scapula Retraction  Red;15 reps    Shoulder Extension  Red;15 reps      Shoulder Exercises: Standing   External Rotation  Theraband;20 reps;Both    Theraband Level (Shoulder External Rotation)  Level 2 (Red)    Flexion  Weights;Both;20 reps    Shoulder Flexion Weight (lbs)  1    ABduction  Weights;20 reps;Both    Shoulder ABduction Weight (lbs)  1      Manual Therapy   Manual Therapy  Neural Stretch;Passive ROM    Manual therapy comments  pain and tightness very end range but motion is good, median nerve tightness with NT stretches    Passive ROM  RT UE    Neural Stretch  RT UE               PT Short Term Goals - 07/24/18 0956      PT SHORT TERM GOAL #1   Title  independent with initial HEP    Status  Achieved  PT Long Term Goals - 07/24/18 0956      PT LONG TERM GOAL #1   Title  understand proper posture and body mechanics     Status  Partially Met      PT LONG TERM GOAL #4   Title  report pain decreased 50% at work            Plan - 07/24/18 1015    Clinical Impression Statement  Pt continues to do well, she reports an overall improvement. Cues needed for posture with some exercises. PROM remains well, she does reports some pain at end range of flexion. R shoulder internal rotation is limited.    Rehab Potential  Good    PT Frequency  2x / week    PT Duration  8 weeks    PT Next Visit Plan  assess and progress with posture ex       Patient will benefit from skilled therapeutic intervention in order to improve the following deficits and impairments:  Decreased range of motion, Increased fascial restricitons, Impaired UE functional use, Increased muscle spasms, Pain, Improper body mechanics, Impaired flexibility, Decreased strength  Visit Diagnosis: Acute pain of right shoulder  Stiffness of right shoulder, not elsewhere classified  Cramp and spasm     Problem List Patient Active Problem List   Diagnosis Date Noted  .  Closed nondisplaced fracture of proximal phalanx of lesser toe of left foot 11/19/2017  . GI bleed 10/23/2017  . Hypokalemia 10/23/2017  . Nausea vomiting and diarrhea 10/23/2017  . Hematochezia 10/23/2017  . Back pain 04/12/2014  . Arthritis 04/12/2014  . Knee pain 04/12/2014  . Essential hypertension 11/27/2013  . Osteoarthritis of left knee 07/16/2012    Scot Jun, PTA 07/24/2018, 10:17 AM  [place], [place], [address] Phone: [phone]   Fax:  [phone]  Name: Margaret [name] MRN: [id number] Date of Birth: 01/03/1951

## 2018-07-30 ENCOUNTER — Ambulatory Visit: Payer: Federal, State, Local not specified - PPO | Admitting: Physical Therapy
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
## 2018-07-31 ENCOUNTER — Ambulatory Visit: Payer: Federal, State, Local not specified - PPO | Admitting: Obstetrics and Gynecology

## 2018-07-31 ENCOUNTER — Other Ambulatory Visit: Payer: Self-pay

## 2018-07-31 ENCOUNTER — Other Ambulatory Visit: Payer: Self-pay | Admitting: Family Medicine

## 2018-07-31 ENCOUNTER — Encounter: Payer: Self-pay | Admitting: Obstetrics and Gynecology

## 2018-07-31 VITALS — BP 120/76 | HR 70 | Ht 66.0 in | Wt 169.0 lb

## 2018-07-31 DIAGNOSIS — Z1231 Encounter for screening mammogram for malignant neoplasm of breast: Secondary | ICD-10-CM

## 2018-07-31 DIAGNOSIS — Z01419 Encounter for gynecological examination (general) (routine) without abnormal findings: Secondary | ICD-10-CM

## 2018-07-31 NOTE — Patient Instructions (Signed)

## 2018-08-06 ENCOUNTER — Ambulatory Visit: Payer: Federal, State, Local not specified - PPO | Admitting: Physical Therapy

## 2018-08-14 ENCOUNTER — Ambulatory Visit: Payer: Federal, State, Local not specified - PPO | Attending: Orthopaedic Surgery | Admitting: Physical Therapy

## 2018-08-14 ENCOUNTER — Encounter: Payer: Self-pay | Admitting: Physical Therapy

## 2018-08-14 DIAGNOSIS — M25611 Stiffness of right shoulder, not elsewhere classified: Secondary | ICD-10-CM | POA: Insufficient documentation

## 2018-08-14 NOTE — Therapy (Signed)
[place], [place], [address] Phone: [phone]   Fax:  [phone]  Physical Therapy Treatment  Patient Details  Name: Margaret [name] MRN: [id number] Date of Birth: [birth year] Referring Provider (PT): Jinny Blossom   Encounter Date: 08/14/2018  PT End of Session - 08/14/18 0852    Visit Number  4    Date for PT Re-Evaluation  09/01/18    PT Start Time  0845    PT Stop Time  0915    PT Time Calculation (min)  30 min       Past Medical History:  Diagnosis Date  . Allergy   . Anemia   . Colitis   . Dyspareunia   . Endometriosis   . GERD (gastroesophageal reflux disease)   . High cholesterol   . Hx of abnormal Pap smear 1980's  . Hypertension   . Osteopenia   . Ulcer   . Urinary incontinence     Past Surgical History:  Procedure Laterality Date  . ABDOMINAL HYSTERECTOMY  09/2008   TVH/BSO--Dr. Quincy Simmonds with TVT  . BLADDER SUSPENSION  09/2008   Dr. Quincy Simmonds  . CERVIX LESION DESTRUCTION  1980   hx abnormal pap--dysplasia  . COLONOSCOPY WITH PROPOFOL Left 10/25/2017   Procedure: COLONOSCOPY WITH PROPOFOL;  Surgeon: Ronnette Juniper, MD;  Location: [facility];  Service: Gastroenterology;  Laterality: Left;    There were no vitals filed for this visit.  Subjective Assessment - 08/14/18 0851    Subjective  90% better. I am ready for D/C    Currently in Pain?  No/denies         [facility] PT Assessment - 08/14/18 0001      AROM   AROM Assessment Site  Shoulder    Right/Left Shoulder  Right   standing   Right Shoulder Flexion  170 Degrees    Right Shoulder ABduction  170 Degrees    Right Shoulder Internal Rotation  85 Degrees    Right Shoulder External Rotation  90 Degrees      Strength   Overall Strength Comments  4+/5                   [facility] Adult PT Treatment/Exercise - 08/14/18 0001      Neck Exercises: Machines for Strengthening   UBE (Upper Arm Bike)  L 3  55fd/3 back    Cybex Row  20# 2 sets 15    Lat Pull  20# 2 sets 15      Neck Exercises: Theraband   Scapula Retraction  15 reps;Red    Shoulder Extension  15 reps;Red    Shoulder ADduction  15 reps;Red    Shoulder External Rotation  15 reps;Red               PT Short Term Goals - 07/24/18 0956      PT SHORT TERM GOAL #1   Title  independent with initial HEP    Status  Achieved        PT Long Term Goals - 08/14/18 09794     PT LONG TERM GOAL #1   Title  understand proper posture and body mechanics     Status  Achieved      PT LONG TERM GOAL #2   Title  Pt will be I with HEP  flexibility.     Status  Achieved      PT LONG TERM GOAL #3  Title  increase right shouldre IR to 70 degrees    Status  Achieved      PT LONG TERM GOAL #4   Title  report pain decreased 50% at work    Status  Achieved      PT LONG TERM GOAL #5   Title  Pt will be able to walk for fitness 20 min at a time without increasing pain.     Status  Achieved            Plan - 08/14/18 0853    Clinical Impression Statement  all goals. pt back at [place] resuming all activities.    PT Next Visit Plan  D/C       Patient will benefit from skilled therapeutic intervention in order to improve the following deficits and impairments:  Decreased range of motion, Increased fascial restricitons, Impaired UE functional use, Increased muscle spasms, Pain, Improper body mechanics, Impaired flexibility, Decreased strength  Visit Diagnosis: Stiffness of right shoulder, not elsewhere classified     Problem List Patient Active Problem List   Diagnosis Date Noted  . Closed nondisplaced fracture of proximal phalanx of lesser toe of left foot 11/19/2017  . GI bleed 10/23/2017  . Hypokalemia 10/23/2017  . Nausea vomiting and diarrhea 10/23/2017  . Hematochezia 10/23/2017  . 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 Back pain 04/12/2014  . Arthritis 04/12/2014  . Knee pain 04/12/2014  .  Essential hypertension 11/27/2013  . Osteoarthritis of left knee 07/16/2012  PHYSICAL THERAPY DISCHARGE SUMMARY   Plan: Patient agrees to discharge.  Patient goals were met. Patient is being discharged due to meeting the stated rehab goals.  ?????       Lenardo Westwood,ANGIE PTA 08/14/2018, 9:13 AM  [place], [place], [address] Phone: [phone]   Fax:  [phone]  Name: Margaret [name] MRN: [id number] Date of Birth: 14-Apr-1951

## 2018-08-21 ENCOUNTER — Ambulatory Visit: Payer: Federal, State, Local not specified - PPO | Admitting: Diagnostic Neuroimaging

## 2018-08-21 ENCOUNTER — Encounter: Payer: Self-pay | Admitting: Diagnostic Neuroimaging

## 2018-08-21 ENCOUNTER — Encounter

## 2018-08-21 VITALS — BP 99/62 | HR 76 | Ht 66.0 in | Wt 170.2 lb

## 2018-08-21 DIAGNOSIS — R0789 Other chest pain: Secondary | ICD-10-CM | POA: Diagnosis not present

## 2018-08-21 DIAGNOSIS — G8921 Chronic pain due to trauma: Secondary | ICD-10-CM

## 2018-08-21 NOTE — Patient Instructions (Signed)
Thank you for coming to see us at [facility]. I hope we have been able to provide you high quality care today.  You may receive a patient satisfaction survey over the next few weeks. We would appreciate your feedback and comments so that we may continue to improve ourselves and the health of our patients.    ~~~~~~~~~~~~~~~~~~~~~~~~~~~~~~~~~~~~~~~~~~~~~~~~~~~~~~~~~~~~~~~~~  DR. Jceon Alverio'S GUIDE TO HAPPY AND HEALTHY LIVING These are some of my general health and wellness recommendations. Some of them may apply to you better than others. Please use common sense as you try these suggestions and feel free to ask me any questions.   ACTIVITY/FITNESS Mental, social, emotional and physical stimulation are very important for brain and body health. Try learning a new activity (arts, music, language, sports, games).  Keep moving your body to the best of your abilities. You can do this at home, inside or outside, the park, community center, gym or anywhere you like. Consider a physical therapist or personal trainer to get started. Fitness trackers, smart-watches or  smart-phones can help as well.   NUTRITION Eat more plants: colorful vegetables, nuts, seeds and berries.  Eat less sugar, salt, preservatives and processed foods.  Avoid toxins such as cigarettes and alcohol.  Drink water when you are thirsty. Warm water with a slice of lemon is an excellent morning drink to start the day.  Consider these websites for more information The Nutrition Source (https://www.hsph.harvard.edu/nutritionsource) Precision Nutrition (www.precisionnutrition.com/blog/infographics)   RELAXATION Consider practicing mindfulness meditation or other relaxation techniques such as deep breathing, prayer, yoga, tai chi, massage. See website mindful.org or the apps Headspace or Calm to help get started.   SLEEP Try to get at least 7-8+ hours sleep per day. Regular exercise and reduced caffeine  will help you sleep better. Practice good sleep hygeine techniques. See website sleep.org for more information.   PLANNING Prepare estate planning, living will, healthcare POA documents. Sometimes this is best planned with the help of an attorney. Theconversationproject.org and agingwithdignity.org are excellent resources. 
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
## 2018-08-24 ENCOUNTER — Other Ambulatory Visit: Payer: Self-pay | Admitting: Podiatry

## 2018-08-24 ENCOUNTER — Ambulatory Visit: Payer: Federal, State, Local not specified - PPO | Admitting: Podiatry

## 2018-08-24 ENCOUNTER — Ambulatory Visit (INDEPENDENT_AMBULATORY_CARE_PROVIDER_SITE_OTHER): Payer: Federal, State, Local not specified - PPO

## 2018-08-24 ENCOUNTER — Encounter: Payer: Self-pay | Admitting: Podiatry

## 2018-08-24 VITALS — BP 94/51 | HR 78

## 2018-08-24 DIAGNOSIS — M659 Synovitis and tenosynovitis, unspecified: Secondary | ICD-10-CM | POA: Diagnosis not present

## 2018-08-24 DIAGNOSIS — M7752 Other enthesopathy of left foot: Secondary | ICD-10-CM | POA: Diagnosis not present

## 2018-08-24 DIAGNOSIS — M79672 Pain in left foot: Secondary | ICD-10-CM

## 2018-08-24 DIAGNOSIS — M7750 Other enthesopathy of unspecified foot: Secondary | ICD-10-CM

## 2018-08-24 DIAGNOSIS — M79671 Pain in right foot: Secondary | ICD-10-CM

## 2018-08-24 DIAGNOSIS — B351 Tinea unguium: Secondary | ICD-10-CM

## 2018-08-24 DIAGNOSIS — M7751 Other enthesopathy of right foot: Secondary | ICD-10-CM

## 2018-08-24 LAB — HEPATIC FUNCTION PANEL
AG Ratio: 1.3 (calc) (ref 1.0–2.5)
ALT: 12 U/L (ref 6–29)
AST: 17 U/L (ref 10–35)
Albumin: 4.2 g/dL (ref 3.6–5.1)
Alkaline phosphatase (APISO): 88 U/L (ref 33–130)
Bilirubin, Direct: 0.1 mg/dL (ref 0.0–0.2)
Globulin: 3.2 g/dL (calc) (ref 1.9–3.7)
Indirect Bilirubin: 0.2 mg/dL (calc) (ref 0.2–1.2)
Total Bilirubin: 0.3 mg/dL (ref 0.2–1.2)
Total Protein: 7.4 g/dL (ref 6.1–8.1)

## 2018-08-24 MED ORDER — TERBINAFINE HCL 250 MG PO TABS: 250.0000 mg | ORAL_TABLET | Freq: Every day | ORAL | 0 refills | Status: DC

## 2018-08-27 ENCOUNTER — Other Ambulatory Visit (HOSPITAL_COMMUNITY): Payer: Self-pay | Admitting: Cardiology

## 2018-08-27 DIAGNOSIS — I209 Angina pectoris, unspecified: Secondary | ICD-10-CM

## 2018-08-28 NOTE — Progress Notes (Signed)
   Subjective: 67 year old female presenting today as a new patient with a chief complaint of possible fungus of bilateral toenails as well as throbbing pain to the bilateral feet and ankles, both complaints of which have been ongoing for several years. She notes the pain to the medial and lateral aspects of the ankles. Walking and applying pressure to the feet and ankles increases the pain. She has not done anything for treatment of her symptoms. Patient is here for further evaluation and treatment.   Past Medical History:  Diagnosis Date  . Allergy   . Anemia   . Colitis   . Dyspareunia   . Endometriosis   . GERD (gastroesophageal reflux disease)   . High cholesterol   . Hx of abnormal Pap smear 1980's  . Hypertension   . Osteopenia   . Ulcer   . Urinary incontinence     Objective: Physical Exam General: The patient is alert and oriented x3 in no acute distress.  Dermatology: Hyperkeratotic, discolored, thickened, onychodystrophy of nails noted bilaterally. Skin is warm, dry and supple bilateral lower extremities. Negative for open lesions or macerations.  Vascular: Palpable pedal pulses bilaterally. No edema or erythema noted. Capillary refill within normal limits.  Neurological: Epicritic and protective threshold grossly intact bilaterally.   Musculoskeletal Exam: Pain with palpation noted to the medial and lateral aspects of the bilateral ankles. Range of motion within normal limits to all pedal and ankle joints bilateral. Muscle strength 5/5 in all groups bilateral.   Assessment: #1 onychomycosis of nails 1-5 bilateral  #2 ankle synovitis bilateral   Plan of Care:  #1 Patient was evaluated. #2 Injection of 0.5 mLs Celestone Soluspan injected into the bilateral ankles.  #3 Recommended good shoe gear.  #4 Orders for liver function test placed.  #5 Prescription for Lamisil 250 mg #90 provided to patient.  #6 Office will call patient regarding results.  #7 Return to  clinic in 6 weeks.   Edrick Kins, DPM [facility]  Dr. Edrick Kins, [address]                                        [place], [place] [address]                Office [phone]  Fax [phone]

## 2018-09-04 ENCOUNTER — Ambulatory Visit: Payer: Federal, State, Local not specified - PPO | Admitting: Podiatry

## 2018-09-04 ENCOUNTER — Ambulatory Visit
Admission: RE | Admit: 2018-09-04 | Discharge: 2018-09-04 | Disposition: A | Discharge status: Home / Self Care | Payer: Federal, State, Local not specified - PPO | Source: Ambulatory Visit | Attending: Family Medicine | Admitting: Family Medicine

## 2018-09-04 DIAGNOSIS — Z1231 Encounter for screening mammogram for malignant neoplasm of breast: Secondary | ICD-10-CM

## 2018-09-11 ENCOUNTER — Ambulatory Visit (HOSPITAL_COMMUNITY)
Admission: RE | Admit: 2018-09-11 | Discharge: 2018-09-11 | Disposition: A | Discharge status: Home / Self Care | Payer: Federal, State, Local not specified - PPO | Source: Ambulatory Visit | Attending: Cardiology | Admitting: Cardiology

## 2018-09-11 DIAGNOSIS — I209 Angina pectoris, unspecified: Secondary | ICD-10-CM | POA: Diagnosis not present

## 2018-09-11 LAB — POCT I-STAT CREATININE: Creatinine, Ser: 0.7 mg/dL (ref 0.44–1.00)

## 2018-09-11 MED ORDER — NITROGLYCERIN 0.4 MG SL SUBL
SUBLINGUAL_TABLET | SUBLINGUAL | Status: AC
  Administered 2018-09-11: 0.8 mg via SUBLINGUAL
  Filled 2018-09-11: qty 2

## 2018-09-11 MED ORDER — IOPAMIDOL (ISOVUE-370) INJECTION 76%
100.0000 mL | Freq: Once | INTRAVENOUS | Status: AC | PRN
  Administered 2018-09-11: 90 mL via INTRAVENOUS

## 2018-09-11 MED ORDER — NITROGLYCERIN 0.4 MG SL SUBL
0.8000 mg | SUBLINGUAL_TABLET | Freq: Once | SUBLINGUAL | Status: AC
  Administered 2018-09-11: 0.8 mg via SUBLINGUAL
  Filled 2018-09-11: qty 25

## 2018-09-11 MED ORDER — METOPROLOL TARTRATE 5 MG/5ML IV SOLN
INTRAVENOUS | Status: AC
  Administered 2018-09-11: 5 mg via INTRAVENOUS
  Filled 2018-09-11: qty 15

## 2018-09-11 MED ORDER — METOPROLOL TARTRATE 5 MG/5ML IV SOLN
5.0000 mg | INTRAVENOUS | Status: DC | PRN
  Administered 2018-09-11 (×2): 5 mg via INTRAVENOUS
  Filled 2018-09-11: qty 5

## 2018-09-13 ENCOUNTER — Emergency Department (HOSPITAL_BASED_OUTPATIENT_CLINIC_OR_DEPARTMENT_OTHER)
Admission: EM | Admit: 2018-09-13 | Discharge: 2018-09-13 | Disposition: A | Discharge status: Home / Self Care | Payer: Federal, State, Local not specified - PPO | Attending: Emergency Medicine | Admitting: Emergency Medicine

## 2018-09-13 ENCOUNTER — Encounter (HOSPITAL_BASED_OUTPATIENT_CLINIC_OR_DEPARTMENT_OTHER): Payer: Self-pay | Admitting: *Deleted

## 2018-09-13 ENCOUNTER — Emergency Department (HOSPITAL_BASED_OUTPATIENT_CLINIC_OR_DEPARTMENT_OTHER): Payer: Federal, State, Local not specified - PPO

## 2018-09-13 ENCOUNTER — Other Ambulatory Visit: Payer: Self-pay

## 2018-09-13 DIAGNOSIS — I10 Essential (primary) hypertension: Secondary | ICD-10-CM | POA: Insufficient documentation

## 2018-09-13 DIAGNOSIS — Z79899 Other long term (current) drug therapy: Secondary | ICD-10-CM | POA: Insufficient documentation

## 2018-09-13 DIAGNOSIS — M79601 Pain in right arm: Secondary | ICD-10-CM | POA: Diagnosis present

## 2018-09-13 DIAGNOSIS — K219 Gastro-esophageal reflux disease without esophagitis: Secondary | ICD-10-CM | POA: Diagnosis not present

## 2018-09-13 LAB — BASIC METABOLIC PANEL
Anion gap: 9 (ref 5–15)
BUN: 12 mg/dL (ref 8–23)
CO2: 28 mmol/L (ref 22–32)
Calcium: 8.8 mg/dL — ABNORMAL LOW (ref 8.9–10.3)
Chloride: 102 mmol/L (ref 98–111)
Creatinine, Ser: 0.69 mg/dL (ref 0.44–1.00)
GFR calc Af Amer: >60 mL/min (ref >60)
GFR calc non Af Amer: >60 mL/min (ref >60)
Glucose, Bld: 105 mg/dL — ABNORMAL HIGH (ref 70–99)
Potassium: 3.1 mmol/L — ABNORMAL LOW (ref 3.5–5.1)
Sodium: 139 mmol/L (ref 135–145)

## 2018-09-13 LAB — CBC
HCT: 34.9 % — ABNORMAL LOW (ref 36.0–46.0)
Hemoglobin: 10.4 g/dL — ABNORMAL LOW (ref 12.0–15.0)
MCH: 25.7 pg — ABNORMAL LOW (ref 26.0–34.0)
MCHC: 29.8 g/dL — ABNORMAL LOW (ref 30.0–36.0)
MCV: 86.2 fL (ref 80.0–100.0)
Platelets: 228 10*3/uL (ref 150–400)
RBC: 4.05 MIL/uL (ref 3.87–5.11)
RDW: 15.2 % (ref 11.5–15.5)
WBC: 5.5 10*3/uL (ref 4.0–10.5)
nRBC: 0 % (ref 0.0–0.2)

## 2018-09-13 LAB — TROPONIN I: Troponin I: <0.03 ng/mL (ref <0.03)

## 2018-09-13 MED ORDER — IBUPROFEN 800 MG PO TABS
ORAL_TABLET | ORAL | Status: AC
  Administered 2018-09-13: 800 mg
  Filled 2018-09-13: qty 1

## 2018-09-13 NOTE — Discharge Instructions (Signed)
You may alternate Tylenol 1000 mg every 6 hours as needed for pain and Ibuprofen 800 mg every 8 hours as needed for pain.  Please take Ibuprofen with food. ° °

## 2018-09-13 NOTE — ED Provider Notes (Signed)
CHIEF COMPLAINT: Right arm pain  HPI: Patient is a 67 year old right-hand-dominant female with history of hypertension, GERD who presents emergency department with right arm pain that started at 5 PM yesterday.  States that she has not had any injury to her arm but was cleaning carpets 2 days ago and thinks this may be why she is having pain.  Pain is worse with use of the hand, movement and palpation.  She states she has had intermittent chest pressure for several years and is being followed by [facility] cardiology for this.  She had a coronary CT angio 2 days ago but has not received the results.  States that she did feel short of breath.  No nausea, vomiting, diaphoresis.  No history of diabetes, hyperlipidemia or tobacco use.  ROS: See HPI Constitutional: no fever  Eyes: no drainage  ENT: no runny nose   Cardiovascular: Chronic chest pain  Resp:  SOB  GI: no vomiting GU: no dysuria Integumentary: no rash  Allergy: no hives  Musculoskeletal: no leg swelling  Neurological: no slurred speech ROS otherwise negative  PAST MEDICAL HISTORY/PAST SURGICAL HISTORY:  Past Medical History:  Diagnosis Date  . Allergy   . Anemia   . Colitis   . Dyspareunia   . Endometriosis   . GERD (gastroesophageal reflux disease)   . High cholesterol   . Hx of abnormal Pap smear 1980's  . Hypertension   . Osteopenia   . Ulcer   . Urinary incontinence     MEDICATIONS:  Prior to Admission medications   Medication Sig Start Date End Date Taking? Authorizing Provider  Cholecalciferol (VITAMIN D) 2000 UNITS CAPS Take 2,000 Units by mouth daily.     [provider]  Ferrous Sulfate (IRON) 325 (65 Fe) MG TABS Take 325 mg by mouth daily.     [provider]  fexofenadine (ALLEGRA) 180 MG tablet Take 180 mg by mouth daily as needed for allergies.     [provider]  fluticasone (FLONASE) 50 MCG/ACT nasal spray Place 1 spray into both nostrils daily as needed for allergies or  rhinitis.     [provider]  lisinopril-hydrochlorothiazide (PRINZIDE,ZESTORETIC) 10-12.5 MG per tablet Take 1 tablet by mouth daily. 05/05/15   [provider]  Multiple Vitamins-Minerals (MULTIVITAMIN ADULT PO) Take 1 tablet by mouth daily.    [provider]  omeprazole (PRILOSEC) 20 MG capsule Take 20 mg by mouth 2 (two) times daily.  05/07/18   [provider]  OVER THE COUNTER MEDICATION Apply 1 application topically daily as needed (knee pain). MAGNESIUM OIL APPLIED TO KNEE FOR ARTHRITIS PAIN    [provider]  terbinafine (LAMISIL) 250 MG tablet Take 1 tablet (250 mg total) by mouth daily. Patient not taking: Reported on 09/11/2018 08/24/18   Edrick Kins, DPM    ALLERGIES:  Allergies  Allergen Reactions  . Ciprofloxacin Nausea And Vomiting  . Sulfa Antibiotics Other (See Comments)    Unknown    SOCIAL HISTORY:  Social History   Tobacco Use  . Smoking status: Never Smoker  . Smokeless tobacco: Never Used  Substance Use Topics  . Alcohol use: Yes    Comment: rare    FAMILY HISTORY: Family History  Problem Relation Age of Onset  . Diabetes Mother   . Hypertension Mother   . Thyroid disease Sister   . Seizures Brother   . Diabetes Brother   . Hypertension Brother   . Diabetes Brother   . Arthritis  Unknown   . Diabetes Unknown   . Breast cancer Cousin     EXAM: BP 101/79   Pulse 62   Resp 17   SpO2 97%  CONSTITUTIONAL: Alert and oriented and responds appropriately to questions. Well-appearing; well-nourished HEAD: Normocephalic EYES: Conjunctivae clear, pupils appear equal, EOMI ENT: normal nose; moist mucous membranes NECK: Supple, no meningismus, no nuchal rigidity, no LAD  CARD: RRR; S1 and S2 appreciated; no murmurs, no clicks, no rubs, no gallops RESP: Normal chest excursion without splinting or tachypnea; breath sounds clear and equal bilaterally; no wheezes, no rhonchi, no rales, no hypoxia or respiratory  distress, speaking full sentences ABD/GI: Normal bowel sounds; non-distended; soft, non-tender, no rebound, no guarding, no peritoneal signs, no hepatosplenomegaly BACK:  The back appears normal and is non-tender to palpation, there is no CVA tenderness EXT: Patient is tender to palpation over the right hand, wrist and forearm.  There is no swelling, bony deformity, ecchymosis, warmth or redness.  She has normal grip strength in the right hand and a 2+ radial pulse.  Reports normal sensation throughout the right arm.  Compartments are soft.  No edema. SKIN: Normal color for age and race; warm; no rash NEURO: Moves all extremities equally PSYCH: The patient's mood and manner are appropriate. Grooming and personal hygiene are appropriate.  MEDICAL DECISION MAKING: Patient here with right arm pain.  Suspect that this is musculoskeletal in nature.  No findings to suggest cellulitis, abscess, compartment syndrome, fracture.  Doubt DVT.  Has been complaining of chest pain and shortness of breath for years.  Is being followed by [facility] cardiology and had a coronary CT 2 days ago.  I have reviewed the results for the CT and they were normal.  Will obtain cardiac labs, chest x-ray today.  EKG is normal.  Will give ibuprofen for pain.  ED PROGRESS: Patient's chest x-ray is clear. Labs are unremarkable.  Troponin is negative.  I feel she is safe to be discharged home.  I have low suspicion that this is ACS.  Suspect again that this is musculoskeletal pain and have recommended alternating Tylenol and Motrin.  At this time, I do not feel there is any life-threatening condition present. I have reviewed and discussed all results (EKG, imaging, lab, urine as appropriate) and exam findings with patient/family. I have reviewed nursing notes and appropriate previous records.  I feel the patient is safe to be discharged home without further emergent workup and can continue workup as an outpatient as needed. Discussed  usual and customary return precautions. Patient/family verbalize understanding and are comfortable with this plan.  Outpatient follow-up has been provided if needed. All questions have been answered.    EKG Interpretation  Date/Time:  Sunday September 13 2018 01:31:14 EDT Ventricular Rate:  70 PR Interval:    QRS Duration: 99 QT Interval:  396 QTC Calculation: 428 R Axis:   59 Text Interpretation:  Age not entered, assumed to be  67 years old for purpose of ECG interpretation Sinus rhythm Atrial premature complex Baseline wander in lead(s) V6 No significant change since last tracing Confirmed by Ward, Cyril Mourning [id number]) on 09/13/2018 2:09:21 AM         Ward, Delice Bison, DO 09/13/18 8413

## 2018-09-13 NOTE — ED Notes (Signed)
See downtime forms.

## 2018-10-05 ENCOUNTER — Ambulatory Visit: Payer: Federal, State, Local not specified - PPO | Admitting: Podiatry

## 2019-01-16 ENCOUNTER — Encounter (HOSPITAL_BASED_OUTPATIENT_CLINIC_OR_DEPARTMENT_OTHER): Payer: Self-pay | Admitting: Respiratory Therapy

## 2019-01-16 ENCOUNTER — Emergency Department (HOSPITAL_BASED_OUTPATIENT_CLINIC_OR_DEPARTMENT_OTHER)
Admission: EM | Admit: 2019-01-16 | Discharge: 2019-01-16 | Disposition: A | Discharge status: Home / Self Care | Payer: Federal, State, Local not specified - PPO | Attending: Emergency Medicine | Admitting: Emergency Medicine

## 2019-01-16 ENCOUNTER — Other Ambulatory Visit: Payer: Self-pay

## 2019-01-16 ENCOUNTER — Emergency Department (HOSPITAL_BASED_OUTPATIENT_CLINIC_OR_DEPARTMENT_OTHER): Payer: Federal, State, Local not specified - PPO

## 2019-01-16 DIAGNOSIS — I10 Essential (primary) hypertension: Secondary | ICD-10-CM | POA: Insufficient documentation

## 2019-01-16 DIAGNOSIS — Z79899 Other long term (current) drug therapy: Secondary | ICD-10-CM | POA: Diagnosis not present

## 2019-01-16 DIAGNOSIS — R0789 Other chest pain: Secondary | ICD-10-CM | POA: Diagnosis present

## 2019-01-16 LAB — CBC WITH DIFFERENTIAL/PLATELET
Abs Immature Granulocytes: 0.01 10*3/uL (ref 0.00–0.07)
Basophils Absolute: 0 10*3/uL (ref 0.0–0.1)
Basophils Relative: 1 %
Eosinophils Absolute: 0.3 10*3/uL (ref 0.0–0.5)
Eosinophils Relative: 6 %
HCT: 36.3 % (ref 36.0–46.0)
Hemoglobin: 10.9 g/dL — ABNORMAL LOW (ref 12.0–15.0)
Immature Granulocytes: 0 %
Lymphocytes Relative: 56 %
Lymphs Abs: 3.1 10*3/uL (ref 0.7–4.0)
MCH: 25.6 pg — ABNORMAL LOW (ref 26.0–34.0)
MCHC: 30 g/dL (ref 30.0–36.0)
MCV: 85.2 fL (ref 80.0–100.0)
Monocytes Absolute: 0.5 10*3/uL (ref 0.1–1.0)
Monocytes Relative: 9 %
Neutro Abs: 1.5 10*3/uL — ABNORMAL LOW (ref 1.7–7.7)
Neutrophils Relative %: 28 %
Platelets: 224 10*3/uL (ref 150–400)
RBC: 4.26 MIL/uL (ref 3.87–5.11)
RDW: 15.3 % (ref 11.5–15.5)
WBC: 5.4 10*3/uL (ref 4.0–10.5)
nRBC: 0 % (ref 0.0–0.2)

## 2019-01-16 LAB — BASIC METABOLIC PANEL
Anion gap: 4 — ABNORMAL LOW (ref 5–15)
BUN: 13 mg/dL (ref 8–23)
CO2: 28 mmol/L (ref 22–32)
Calcium: 8.9 mg/dL (ref 8.9–10.3)
Chloride: 103 mmol/L (ref 98–111)
Creatinine, Ser: 0.7 mg/dL (ref 0.44–1.00)
GFR calc Af Amer: >60 mL/min (ref >60)
GFR calc non Af Amer: >60 mL/min (ref >60)
Glucose, Bld: 107 mg/dL — ABNORMAL HIGH (ref 70–99)
Potassium: 3.5 mmol/L (ref 3.5–5.1)
Sodium: 135 mmol/L (ref 135–145)

## 2019-01-16 LAB — TROPONIN I: Troponin I: <0.03 ng/mL (ref <0.03)

## 2019-01-16 MED ORDER — KETOROLAC TROMETHAMINE 30 MG/ML IJ SOLN
30.0000 mg | Freq: Once | INTRAMUSCULAR | Status: AC
  Administered 2019-01-16: 30 mg via INTRAVENOUS
  Filled 2019-01-16: qty 1

## 2019-01-16 NOTE — Discharge Instructions (Addendum)
You may alternate Tylenol 1000 mg every 6 hours as needed for pain and Ibuprofen 800 mg every 8 hours as needed for pain.  Please take Ibuprofen with food. ° °
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
## 2019-01-16 NOTE — ED Triage Notes (Signed)
Patient co left side chest pain; onset 1900 last pm; states resolved and then woke up with pain; denies shortness of breath; denies nausea. Ambulatory with steady gait.

## 2019-01-16 NOTE — ED Triage Notes (Signed)
States pain is worse with palpation.

## 2019-06-22 ENCOUNTER — Encounter (HOSPITAL_BASED_OUTPATIENT_CLINIC_OR_DEPARTMENT_OTHER): Payer: Self-pay | Admitting: *Deleted

## 2019-06-22 ENCOUNTER — Emergency Department (HOSPITAL_BASED_OUTPATIENT_CLINIC_OR_DEPARTMENT_OTHER)
Admission: EM | Admit: 2019-06-22 | Discharge: 2019-06-23 | Disposition: A | Discharge status: Home / Self Care | Payer: Federal, State, Local not specified - PPO | Attending: Emergency Medicine | Admitting: Emergency Medicine

## 2019-06-22 ENCOUNTER — Other Ambulatory Visit: Payer: Self-pay

## 2019-06-22 ENCOUNTER — Emergency Department (HOSPITAL_BASED_OUTPATIENT_CLINIC_OR_DEPARTMENT_OTHER): Payer: Federal, State, Local not specified - PPO

## 2019-06-22 DIAGNOSIS — I10 Essential (primary) hypertension: Secondary | ICD-10-CM | POA: Diagnosis not present

## 2019-06-22 DIAGNOSIS — Z79899 Other long term (current) drug therapy: Secondary | ICD-10-CM | POA: Insufficient documentation

## 2019-06-22 DIAGNOSIS — Z90710 Acquired absence of both cervix and uterus: Secondary | ICD-10-CM | POA: Diagnosis not present

## 2019-06-22 DIAGNOSIS — R109 Unspecified abdominal pain: Secondary | ICD-10-CM | POA: Diagnosis present

## 2019-06-22 DIAGNOSIS — R0789 Other chest pain: Secondary | ICD-10-CM | POA: Diagnosis not present

## 2019-06-22 LAB — URINALYSIS, ROUTINE W REFLEX MICROSCOPIC
Bilirubin Urine: NEGATIVE
Glucose, UA: NEGATIVE mg/dL
Hgb urine dipstick: NEGATIVE
Ketones, ur: NEGATIVE mg/dL
Leukocytes,Ua: NEGATIVE
Nitrite: NEGATIVE
Protein, ur: NEGATIVE mg/dL
Specific Gravity, Urine: 1.01 (ref 1.005–1.030)
pH: 6 (ref 5.0–8.0)

## 2019-06-22 LAB — CBC
HCT: 37.1 % (ref 36.0–46.0)
Hemoglobin: 11.4 g/dL — ABNORMAL LOW (ref 12.0–15.0)
MCH: 26.3 pg (ref 26.0–34.0)
MCHC: 30.7 g/dL (ref 30.0–36.0)
MCV: 85.7 fL (ref 80.0–100.0)
Platelets: 269 10*3/uL (ref 150–400)
RBC: 4.33 MIL/uL (ref 3.87–5.11)
RDW: 14.8 % (ref 11.5–15.5)
WBC: 5.5 10*3/uL (ref 4.0–10.5)
nRBC: 0 % (ref 0.0–0.2)

## 2019-06-22 LAB — BASIC METABOLIC PANEL WITH GFR
Anion gap: 10 (ref 5–15)
BUN: 8 mg/dL (ref 8–23)
CO2: 27 mmol/L (ref 22–32)
Calcium: 9.1 mg/dL (ref 8.9–10.3)
Chloride: 98 mmol/L (ref 98–111)
Creatinine, Ser: 0.66 mg/dL (ref 0.44–1.00)
GFR calc Af Amer: >60 mL/min
GFR calc non Af Amer: >60 mL/min
Glucose, Bld: 97 mg/dL (ref 70–99)
Potassium: 3.3 mmol/L — ABNORMAL LOW (ref 3.5–5.1)
Sodium: 135 mmol/L (ref 135–145)

## 2019-06-22 LAB — TROPONIN I (HIGH SENSITIVITY): Troponin I (High Sensitivity): 4 ng/L (ref <18)

## 2019-06-22 NOTE — Discharge Instructions (Addendum)
Work-up for the left-sided flank pain without any abnormalities.  Urinalysis showed no evidence of urinary tract infection.  Your chest pain component did not last long enough to be concerning for cardiac chest pain.  Plus it sounds as if it is been ongoing on and off for many months.  If you have chest pain that last 15 minutes or longer nonstop you should get evaluated.  Follow-up with your primary care doctor make an appointment.  You may take Tylenol 1000 mg every 6 hours as needed for pain.

## 2019-06-22 NOTE — ED Notes (Signed)
Upon assessment in room pt states they have had left chest pain off and on for 2 days, Pt did not tell triage nurse this information. EKG performed ASAP

## 2019-06-22 NOTE — ED Triage Notes (Signed)
Back pain. She had an [place] of her left kidney 2 weeks ago and was told she needs to see a urologist due to urine backing up into her kidney.

## 2019-06-22 NOTE — ED Provider Notes (Signed)
[name] EMERGENCY DEPARTMENT Provider Note   CSN: [id number] Arrival date & time: 06/22/19  1947     History   Chief Complaint Chief Complaint  Patient presents with   Chest Pain   Back Pain    HPI Margaret [name] is a 68 y.o. female.     Patient with a complaint of left-sided flank pain for about 2 weeks.  She had an ultrasound done that raise some concerns about an abnormality of the ureter.  And was to be referred to urology.  Patient denies any dysuria any blood in the urine.  No history of kidney stones.  No fevers.  No upper respiratory symptoms.  Patient also with some intermittent chest pain that she has been having on and off since February.  Never lasted 15 minutes.  No chest pain currently.  Has not had any chest pain for several hours.  No shortness of breath.  No history of known coronary artery disease.  Does have a history of hypertension high cholesterol.     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 Quincy Simmonds with TVT   BLADDER SUSPENSION  09/2008   Dr. Quincy Simmonds   CERVIX LESION DESTRUCTION  1980   hx abnormal pap--dysplasia    COLONOSCOPY WITH PROPOFOL Left 10/25/2017   Procedure: COLONOSCOPY WITH PROPOFOL;  Surgeon: Ronnette Juniper, MD;  Location: [facility];  Service: Gastroenterology;  Laterality: Left;     OB History    Gravida  3   Para  3   Term  3   Preterm      AB      Living  3     SAB      TAB      Ectopic      Multiple      Live Births               Home Medications    Prior to Admission medications   Medication Sig Start Date End Date Taking? Authorizing Provider  Cholecalciferol (VITAMIN D) 2000 UNITS CAPS Take 2,000 Units by mouth daily.     [provider]  Ferrous Sulfate (IRON) 325 (65 Fe) MG TABS Take 325 mg by mouth daily.     [provider]  fexofenadine (ALLEGRA) 180 MG tablet Take 180 mg by mouth daily as needed for allergies.     [provider]  fluticasone (FLONASE) 50 MCG/ACT nasal spray Place 1 spray into both nostrils daily as needed for allergies or rhinitis.     [provider]  lisinopril-hydrochlorothiazide (PRINZIDE,ZESTORETIC) 10-12.5 MG per tablet Take  1 tablet by mouth daily. 05/05/15   [provider]  Multiple Vitamins-Minerals (MULTIVITAMIN ADULT PO) Take 1 tablet by mouth daily.    [provider]  omeprazole (PRILOSEC) 20 MG capsule Take 20 mg by mouth 2 (two) times daily.  05/07/18   [provider]  OVER THE COUNTER MEDICATION Apply 1 application topically daily as needed (knee pain). MAGNESIUM OIL APPLIED TO KNEE FOR ARTHRITIS PAIN    [provider]  terbinafine (LAMISIL) 250 MG tablet Take 1 tablet (250 mg total) by mouth daily. Patient not taking: Reported on 09/11/2018 08/24/18   Edrick Kins, DPM    Family History Family History  Problem Relation Age of Onset   Diabetes Mother    Hypertension Mother    Thyroid disease Sister    Seizures Brother    Diabetes Brother    Hypertension Brother    Diabetes Brother    Arthritis Other    Diabetes Other     Breast cancer Cousin     Social History Social History   Tobacco Use   Smoking status: Never Smoker   Smokeless tobacco: Never Used  Substance Use Topics   Alcohol use: Yes    Comment: rare   Drug use: No     Allergies   Ciprofloxacin and Sulfa antibiotics   Review of Systems Review of Systems  Constitutional: Negative for chills and fever.  HENT: Negative for congestion, rhinorrhea and sore throat.   Eyes: Negative for visual disturbance.  Respiratory: Negative for cough and shortness of breath.   Cardiovascular: Positive for chest pain. Negative for leg swelling.  Gastrointestinal: Negative for abdominal pain, diarrhea, nausea and vomiting.  Genitourinary: Positive for flank pain. Negative for dysuria and hematuria.  Musculoskeletal: Negative for back pain and neck pain.  Skin: Negative for rash.  Neurological: Negative for dizziness, light-headedness and headaches.  Hematological: Does not bruise/bleed easily.  Psychiatric/Behavioral: Negative for confusion.     Physical Exam Updated Vital Signs BP 107/64    Pulse 74    Temp 98.6 F (37 C) (Oral)    Resp 14    Ht 1.676 m (5\' 6" )    Wt 72.6 kg    SpO2 99%    BMI 25.82 kg/m   Physical Exam Vitals signs and nursing note reviewed.  Constitutional:      General: She is not in acute distress.    Appearance: Normal appearance. She is well-developed.  HENT:     Head: Normocephalic and atraumatic.  Eyes:     Extraocular Movements: Extraocular movements intact.     Conjunctiva/sclera: Conjunctivae normal.     Pupils: Pupils are equal, round, and reactive to light.  Neck:     Musculoskeletal: Normal range of motion and neck supple.  Cardiovascular:     Rate and Rhythm: Normal rate and regular rhythm.     Heart sounds: No murmur.  Pulmonary:     Effort: Pulmonary effort is normal. No respiratory distress.     Breath sounds: Normal breath sounds.  Abdominal:     Palpations: Abdomen is soft. There is no mass.       Tenderness: There is no abdominal tenderness. There is no guarding.  Musculoskeletal: Normal range of motion.        General: No swelling.  Skin:    General: Skin is warm and dry.  Neurological:     General: No focal deficit present.     Mental Status: She is alert and oriented to person,  place, and time.      ED Treatments / Results  Labs (all labs ordered are listed, but only abnormal results are displayed) Labs Reviewed  BASIC METABOLIC PANEL - Abnormal; Notable for the following components:      Result Value   Potassium 3.3 (*)    All other components within normal limits  CBC - Abnormal; Notable for the following components:   Hemoglobin 11.4 (*)    All other components within normal limits  URINALYSIS, ROUTINE W REFLEX MICROSCOPIC  TROPONIN I (HIGH SENSITIVITY)  TROPONIN I (HIGH SENSITIVITY)    EKG EKG Interpretation  Date/Time:  Tuesday June 22 2019 20:55:03 EDT Ventricular Rate:  67 PR Interval:    QRS Duration: 102 QT Interval:  416 QTC Calculation: 440 R Axis:   69 Text Interpretation:  Sinus rhythm Atrial premature complexes in couplets Minimal ST elevation, lateral leads No significant change since last tracing Confirmed by Fredia Sorrow [phone]) on 06/22/2019 9:00:01 PM   Radiology Dg Chest 2 View  Result Date: 06/22/2019 CLINICAL DATA:  Chest pain EXAM: CHEST - 2 VIEW COMPARISON:  January 16, 2019 FINDINGS: The heart size and mediastinal contours are within normal limits. Both lungs are clear. Degenerative changes seen in the midthoracic spine. No acute osseous findings. IMPRESSION: No acute cardiopulmonary disease. Electronically Signed   By: Prudencio Pair M.D.   On: 06/22/2019 21:31   Ct Renal Stone Study  Result Date: 06/22/2019 CLINICAL DATA:  Back pain, recent abnormal renal ultrasound EXAM: CT ABDOMEN AND PELVIS WITHOUT CONTRAST TECHNIQUE: Multidetector CT imaging of the abdomen and pelvis was performed following the standard protocol without IV  contrast. COMPARISON:  CT abdomen 09/02/2016, CT abdomen pelvis 08/22/2015 FINDINGS: Lower chest: Lung bases are clear. Normal heart size. No pericardial effusion. Hepatobiliary: No focal liver abnormality is seen. No gallstones, gallbladder wall thickening, or biliary dilatation. Pancreas: Unremarkable. No pancreatic ductal dilatation or surrounding inflammatory changes. Spleen: Normal in size without focal abnormality. Adrenals/Urinary Tract: Normal adrenal glands. Bilateral extrarenal pelves are similar to prior studies. No urolithiasis or frank hydronephrosis. Few punctate calcifications near the bladder dome are likely vascular and were present on prior studies. There is mild circumferential bladder wall thickening with perivesicular haze. Stomach/Bowel: Distal esophagus, stomach and duodenal sweep are unremarkable. No bowel wall thickening or dilatation. No evidence of obstruction. A normal appendix is visualized. Scattered colonic diverticula without focal pericolonic inflammation to suggest diverticulitis. Vascular/Lymphatic: Atherosclerotic plaque within the normal caliber aorta. No suspicious or enlarged lymph nodes in the included lymphatic chains. Reproductive: Uterus is surgically absent. No concerning adnexal lesions. Other: No abdominopelvic free fluid or free gas. No bowel containing hernias. Musculoskeletal: Multilevel degenerative changes are present in the imaged portions of the spine. No acute osseous abnormality or suspicious osseous lesion. IMPRESSION: Mild circumferential bladder wall thickening with perivesicular haze, suggestive of cystitis. Recommend correlation with urinalysis. No urolithiasis or frank hydronephrosis. Bilateral extrarenal pelves, similar to prior studies. Diverticulosis without evidence of diverticulitis. Post hysterectomy. Aortic Atherosclerosis (ICD10-I70.0). Electronically Signed   By: Lovena Le M.D.   On: 06/22/2019 22:16    Procedures Procedures (including  critical care time)  Medications Ordered in ED Medications - No data to display   Initial Impression / Assessment and Plan / ED Course  I have reviewed the triage vital signs and the nursing notes.  Pertinent labs & imaging results that were available during my care of the patient were reviewed by me and considered in my medical decision making (see chart  for details).       Patient's chest pain sounds very noncardiac.  Intermittent lasting 5 minutes or less since February.  Nothing significant new or worse here in the past few days.  For the chest pain patient had chest x-ray without any acute findings.  EKG had no acute cardiac changes.  Minimal ST changes laterally.  Initial troponin which was ordered by nursing was 4.  Patient will have repeat troponin at 11 PM.  If no significant change in the troponin patient can be discharged home and follow-up with primary care provider.  Clinically not concerned that patient has a cardiac type of chest pain.  Patient's main concern was the left flank pain is been worse for the past 2 weeks.  Work-up for that kidney function was normal.  No leukocytosis.  CT scan of the abdomen showed no stones or any ureter abnormalities.  There was some thickening of the bladder.  But urinalysis is negative for any concerns for urinary tract infection.  We will have patient follow back up with her primary care provider for the flank pain.  Flank pain is not severe.  Repeat troponin will be followed up by Dr. Leonides Schanz the overnight physician.    Patient remains chest pain-free. Final Clinical Impressions(s) / ED Diagnoses   Final diagnoses:  Left flank pain  Atypical chest pain    ED Discharge Orders    None       Fredia Sorrow, MD 06/22/19 2317

## 2019-06-22 NOTE — ED Notes (Signed)
ED Provider at bedside. 

## 2019-06-23 LAB — TROPONIN I (HIGH SENSITIVITY): Troponin I (High Sensitivity): 4 ng/L (ref <18)

## 2019-06-23 NOTE — ED Provider Notes (Signed)
12:15 AM Assumed care.  Patient here with atypical chest pain.  Plan was to follow-up on second troponin.  If negative, patient to be discharged home.  Patient second troponin negative.  Will discharge home.  Not having chest pain currently.  Also complaining of flank pain.  Urine showed no sign of infection.  CT scan showed mild bladder wall thickening without other acute abnormality.  Recommended Tylenol for pain control.  She will follow-up with her PCP.  At this time, I do not feel there is any life-threatening condition present. I have reviewed and discussed all results (EKG, imaging, lab, urine as appropriate) and exam findings with patient/family. I have reviewed nursing notes and appropriate previous records.  I feel the patient is safe to be discharged home without further emergent workup and can continue workup as an outpatient as needed. Discussed usual and customary return precautions. Patient/family verbalize understanding and are comfortable with this plan.  Outpatient follow-up has been provided as needed. All questions have been answered.    Margaret [name], Delice Bison, DO 06/23/19 [phone]

## 2019-08-04 ENCOUNTER — Other Ambulatory Visit: Payer: Self-pay

## 2019-08-06 ENCOUNTER — Other Ambulatory Visit: Payer: Self-pay

## 2019-08-06 ENCOUNTER — Encounter: Payer: Self-pay | Admitting: Obstetrics and Gynecology

## 2019-08-06 ENCOUNTER — Ambulatory Visit: Payer: Federal, State, Local not specified - PPO | Admitting: Obstetrics and Gynecology

## 2019-08-06 VITALS — BP 110/64 | HR 74 | Temp 97.7°F | Resp 14 | Ht 66.25 in | Wt 167.4 lb

## 2019-08-06 DIAGNOSIS — Z01419 Encounter for gynecological examination (general) (routine) without abnormal findings: Secondary | ICD-10-CM

## 2019-08-06 DIAGNOSIS — N760 Acute vaginitis: Secondary | ICD-10-CM | POA: Diagnosis not present

## 2019-08-06 MED ORDER — PREMARIN 0.625 MG/GM VA CREA: TOPICAL_CREAM | VAGINAL | 2 refills | Status: DC

## 2019-08-06 NOTE — Progress Notes (Signed)
68 y.o. [id number] Divorced [place] American female here for annual exam.    Had some vulvar sensitivity a week ago.  No discharge. No dysuria.   She was told she had some blood in her urine with her check up.  She did a renal ultrasound that had a nonspecific finding, and she ultimately had a CT scan showing possible cystitis and diverticulosis.  (She had a episode of urinary incontinence after she had the CT scan done.) She saw a urologist in follow up who was not concerned about the findings.   Going in to work one day per week.  Otherwise working from home.  Thinking about moving back to [facility].  PCP: Glendon Axe, MD     No LMP recorded. Patient has had a hysterectomy.           Sexually active: No.  The current method of family planning is post menopausal status.    Exercising: No.  The patient does not participate in regular exercise at present. Smoker:  no  Health Maintenance: Pap:  Years ago prior to hysterectomy  History of abnormal Pap:  Yes years ago MMG:  Bi-rads 1 neg 09/04/18 Colonoscopy:  10/25/17 History of polyp repeat in 3-5 years BMD:   2019  Result  Normal TDaP:  Yes  Gardasil:   no HIV: neg  Hep C: neg  Screening Labs:   PCP.    reports that she has never smoked. She has never used smokeless tobacco. She reports current alcohol use. She reports that she does not use drugs.  Past Medical History:  Diagnosis Date  . Allergy   . Anemia   . Colitis   . Dyspareunia   . Endometriosis   . GERD (gastroesophageal reflux disease)   . High cholesterol   . Hx of abnormal Pap smear 1980's  . Hypertension   . Osteopenia   . Ulcer   . Urinary incontinence     Past Surgical History:  Procedure Laterality Date  . ABDOMINAL HYSTERECTOMY  09/2008   TVH/BSO--Dr. Quincy Simmonds with TVT  . BLADDER SUSPENSION  09/2008   Dr. Quincy Simmonds  . CERVIX LESION DESTRUCTION  1980   hx abnormal pap--dysplasia  . COLONOSCOPY WITH PROPOFOL Left 10/25/2017   Procedure: COLONOSCOPY  WITH PROPOFOL;  Surgeon: Ronnette Juniper, MD;  Location: [facility];  Service: Gastroenterology;  Laterality: Left;    Current Outpatient Medications  Medication Sig Dispense Refill  . Cholecalciferol (VITAMIN D) 2000 UNITS CAPS Take 2,000 Units by mouth daily.     . Ferrous Sulfate (IRON) 325 (65 Fe) MG TABS Take 325 mg by mouth daily.     . fexofenadine (ALLEGRA) 180 MG tablet Take 180 mg by mouth daily as needed for allergies.     . fluticasone (FLONASE) 50 MCG/ACT nasal spray Place 1 spray into both nostrils daily as needed for allergies or rhinitis.     Marland Kitchen lisinopril-hydrochlorothiazide (PRINZIDE,ZESTORETIC) 10-12.5 MG per tablet Take 1 tablet by mouth daily.  6  . Multiple Vitamins-Minerals (MULTIVITAMIN ADULT PO) Take 1 tablet by mouth daily.    Marland Kitchen OVER THE COUNTER MEDICATION Apply 1 application topically daily as needed (knee pain). MAGNESIUM OIL APPLIED TO KNEE FOR ARTHRITIS PAIN    . terbinafine (LAMISIL) 250 MG tablet Take 1 tablet (250 mg total) by mouth daily. (Patient not taking: Reported on 09/11/2018) 90 tablet 0   No current facility-administered medications for this visit.     Family History  Problem Relation Age of Onset  . Diabetes  Mother   . Hypertension Mother   . Thyroid disease Sister   . Seizures Brother   . Diabetes Brother   . Hypertension Brother   . Diabetes Brother   . Arthritis Other   . Diabetes Other   . Breast cancer Cousin     Review of Systems  All other systems reviewed and are negative.   Exam:   BP 110/64   Pulse 74   Temp 97.7 F (36.5 C)   Resp 14   Ht 5' 6.25" (1.683 m)   Wt 167 lb 6.4 oz (75.9 kg)   BMI 26.82 kg/m     General appearance: alert, cooperative and appears stated age Head: normocephalic, without obvious abnormality, atraumatic Neck: no adenopathy, supple, symmetrical, trachea midline and thyroid normal to inspection and palpation Lungs: clear to auscultation bilaterally Breasts: normal appearance, no masses or  tenderness, No nipple retraction or dimpling, No nipple discharge or bleeding, No axillary adenopathy Heart: regular rate and rhythm Abdomen: soft, non-tender; no masses, no organomegaly Extremities: extremities normal, atraumatic, no cyanosis or edema Skin: skin color, texture, turgor normal. No rashes or lesions Lymph nodes: cervical, supraclavicular, and axillary nodes normal. Neurologic: grossly normal  Pelvic: External genitalia:  no lesions              No abnormal inguinal nodes palpated.              Urethra:  normal appearing urethra with no masses, tenderness or lesions              Bartholins and Skenes: normal                 Vagina: normal appearing vagina with normal color and discharge, no lesions.  Good support.              Cervix:  absent              Pap taken: No. Bimanual Exam:  Uterus:  absent              Adnexa: no mass, fullness, tenderness              Rectal exam: Yes.  .  Confirms.              Anus:  normal sphincter tone, no lesions  Chaperone was present for exam.  Assessment:   Well woman visit with normal exam. Status post TVH/BSO/TVT.  Status post anterior colporrhaphy with biological mesh. HTN. Prediabetic. Vulvovaginitis.  I suspect this is mostly atrophy.   Plan: Mammogram screening discussed. Self breast awareness reviewed. Pap and HR HPV as above. Guidelines for Calcium, Vitamin D, regular exercise program including cardiovascular and weight bearing exercise. Flu vaccine recommended.  Affirm.  Premarin vaginal cream.  Instructed in use.  I discussed potential effect on breast cancer.  Follow up annually and prn.   After visit summary provided.

## 2019-08-06 NOTE — Patient Instructions (Signed)

## 2019-08-07 LAB — VAGINITIS/VAGINOSIS, DNA PROBE
Candida Species: NEGATIVE
Gardnerella vaginalis: NEGATIVE
Trichomonas vaginosis: NEGATIVE

## 2019-08-10 ENCOUNTER — Telehealth: Payer: Self-pay | Admitting: Certified Nurse Midwife

## 2019-08-10 NOTE — Telephone Encounter (Signed)
Patient states prescription for Premarin too expensive. Would like to see if there is another, more affordable option for her. Pharmacy on file.

## 2019-08-10 NOTE — Telephone Encounter (Signed)
Spoke with patient. Reviewed option of Premarin vaginal cream savings card. Discussed possible alternative, estrace vaginal cream or compounded vaginal cream. Patient is going to check on cost with savings card, if not an option will check with her plan for vaginal estrace cream coverage. Patient will notify office if she needs to change RX. Questions answered.   Routing to provider for final review. Patient is agreeable to disposition. Will close encounter.

## 2019-09-08 ENCOUNTER — Other Ambulatory Visit: Payer: Self-pay

## 2019-09-08 ENCOUNTER — Ambulatory Visit: Payer: Federal, State, Local not specified - PPO | Admitting: Orthopedic Surgery

## 2019-09-08 ENCOUNTER — Ambulatory Visit: Payer: Federal, State, Local not specified - PPO

## 2019-09-08 VITALS — BP 108/61 | HR 85 | Temp 96.4°F | Ht 66.0 in | Wt 165.0 lb

## 2019-09-08 DIAGNOSIS — M940 Chondrocostal junction syndrome [Tietze]: Secondary | ICD-10-CM

## 2019-09-08 DIAGNOSIS — M25511 Pain in right shoulder: Secondary | ICD-10-CM

## 2019-09-08 DIAGNOSIS — M5441 Lumbago with sciatica, right side: Secondary | ICD-10-CM | POA: Diagnosis not present

## 2019-09-08 DIAGNOSIS — M25562 Pain in left knee: Secondary | ICD-10-CM | POA: Diagnosis not present

## 2019-09-08 DIAGNOSIS — M5442 Lumbago with sciatica, left side: Secondary | ICD-10-CM

## 2019-09-08 DIAGNOSIS — M542 Cervicalgia: Secondary | ICD-10-CM

## 2019-09-08 DIAGNOSIS — G8929 Other chronic pain: Secondary | ICD-10-CM

## 2019-09-08 DIAGNOSIS — M79621 Pain in right upper arm: Secondary | ICD-10-CM

## 2019-09-08 NOTE — Patient Instructions (Addendum)
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 This causes pain in the front of your chest. The pain usually starts gradually and involves more than one rib. What are the causes? The exact cause of this condition is not always known. It results from stress on the cartilage where your ribs attach to your sternum. The cause of this stress could be:  Chest injury (trauma).  Exercise or activity, such as lifting.  Severe coughing. What increases the risk? You may be at higher risk for this condition if you:  Are female.  Are 35?66 years old.  Recently started a new exercise or work activity.  Have low levels of vitamin D.  Have a condition that makes you cough frequently. What are the signs or symptoms? The main symptom of this condition is chest pain. The pain:  Usually starts gradually and can be sharp or dull.  Gets worse with deep breathing, coughing, or exercise.  Gets better with rest.  May be worse when you press on the sternum-rib connection (tenderness). How is this diagnosed? This condition is diagnosed based on your symptoms, medical history, and a physical exam. Your health care provider will check for tenderness when pressing on your sternum. This is the most important finding. You may also have tests to rule out other causes of chest pain. These may include:  A chest X-ray to check for lung problems.  An electrocardiogram (ECG) to see if you have a heart problem that could be causing the pain.  An imaging scan to rule out a chest or rib fracture. How is this treated? This  condition usually goes away on its own over time. Your health care provider may prescribe an NSAID to reduce pain and inflammation. Your health care provider may also suggest that you:  Rest and avoid activities that make pain worse.  Apply heat or cold to the area to reduce pain and inflammation.  Do exercises to stretch your chest muscles. Follow these instructions at home:  Avoid activities that make pain worse. This includes any activities that use chest, abdominal, and side muscles. ? Use topical Voltaren gel 4 times a day 2 g  If directed, apply heat to the affected area as often as told by your health care provider. Use the heat source that your health care provider recommends, such as a moist heat pack or a heating pad. ? Place a towel between your skin and the heat source. ? Leave the heat on for 20-30 minutes. ? Remove the heat if your skin turns bright red. This is especially important if you are unable to feel pain, heat, or cold. You may have a greater risk of getting burned.  Take over-the-counter and prescription medicines only as told by your health care provider.  Return to your normal activities as told by your health care provider. Ask your health care provider what activities are safe for you.  Keep all follow-up visits as told by your health care provider. This is important. Contact a health care provider if:  You have chills or a fever.  Your pain does not go away or it gets  worse.  You have a cough that does not go away (is persistent). Get help right away if:  You have shortness of breath. This information is not intended to replace advice given to you by your health care provider. Make sure you discuss any questions you have with your health care provider. Document Released: 08/07/2005 Document Revised: 11/12/2017 Document Reviewed: 02/21/2016 Elsevier Patient Education  [employer].  Stretching and range-of-motion exercises These exercises warm up  your muscles and joints and improve the movement and flexibility of your neck. These exercises also help to relieve pain, numbness, and tingling. Chin tuck This exercise is sometimes called axial extension. 1. Using good posture, sit on a stable surface or stand up. If you have trouble maintaining your posture, rest your back and your head against a stable wall during this exercise. 2. Look straight ahead and slowly move your chin back, toward your neck, until you feel a stretch in the back of your head (axial extension). ? Your head should slide back. ? Your chin should be slightly lowered. 3. Hold for ____5______ seconds. 4. Return to the starting position. Repeat ____2______ times. Complete this exercise _____2_____ times a day. Cervical side bend  1. Using good posture, sit on a stable surface or stand up. 2. Without moving your shoulders, slowly tilt your ear toward your uninjured shoulder until you feel a stretch in your neck (cervical muscles) on your left / right side. You should be looking straight ahead. 3. Hold for _____2_____ seconds. 4. Slowly return to the starting position. 5. Repeat with the other side of your neck. Repeat ______5____ times. Complete this exercise ______2____ times a day. Cervical rotation  1. Using good posture, sit on a stable surface or stand up. 2. Slowly turn your head to the side as if you are looking over your left / right shoulder (cervical rotation). ? Keep your eyes level with the ground. ? Stop when you feel a stretch along the side and back of your neck. 3. Hold for _____2_____ seconds. 4. Slowly return to the starting position. 5. Repeat by turning your head to the other side. Repeat ______5____ times. Complete this exercise ____2______ times a day. Neck circles 1. Using good posture, sit on a stable surface or stand up. 2. Slowly roll your head in a circle. Do this in a clockwise or a counterclockwise direction. Repeat __________ times.  Complete this exercise __________ times a day. Strengthening exercises These exercises build strength and endurance in your neck. Endurance is the ability to use your muscles for a long time, even after they get tired. Cervical flexion, isometric  1. Face a wall and stand about 6 inches (15 cm) away from it. 2. Place a soft object, about 6-8 inches (15-20 cm) in diameter, between your forehead and the wall. A soft object could be a small pillow, a ball, or a folded towel. 3. Tuck your chin and gently push your forehead into the soft object (cervical flexion). Keep your jaw and forehead relaxed. Your neck should not move (isometric). 4. Hold for _____2_____ seconds. 5. Release the tension slowly. Relax your neck muscles completely before you repeat the exercise. Repeat ______5____ times. Complete this exercise ______5____ times a day. Cervical lateral flexion, isometric  1. Stand about 6 inches (15 cm) away from a wall. Stand so your shoulder faces the wall, and look straight ahead. This is the lateral position. 2. Place a soft object, about 6-8 inches (15-20 cm) in diameter, between your forehead  and the wall. A soft object could be a small pillow, a ball, or a folded towel. 3. Tuck your chin and gently tilt your head into the object (cervical flexion). Keep your jaw and forehead relaxed. Your neck should not move (isometric). 4. Hold for _____2_____ seconds. 5. Release the tension slowly. Relax your neck muscles completely before you repeat the exercise. 6. Do this exercise on both sides of your neck. Repeat ______5____ times. Complete this exercise ______2____ times a day. Cervical retraction, isometric  1. Stand about 6 inches (15 cm) away from a wall, with your back facing the wall. 2. Place a soft object, about 6-8 inches (15-20 cm) in diameter, between the back of your head and the wall. A soft object could be a small pillow, a ball, or a folded towel. 3. Tuck your chin and gently push  your head straight back into the soft object (cervical retraction). Do not tip your head backward when you do this. Keep your jaw and forehead relaxed with eyes facing forward. Your neck should not move (isometric). 4. Hold for ______2____ seconds. 5. Release the tension slowly. Relax your neck muscles completely before you repeat the exercise. Repeat ______5____ times. Complete this exercise _____2_____ times a day. This information is not intended to replace advice given to you by your health care provider. Make sure you discuss any questions you have with your health care provider. Document Released: 10/28/2005 Document Revised: 02/18/2019 Document Reviewed: 12/21/2018 Elsevier Patient Education  2020 [employer].
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
## 2019-09-13 ENCOUNTER — Other Ambulatory Visit: Payer: Self-pay | Admitting: Family Medicine

## 2019-09-13 DIAGNOSIS — Z1231 Encounter for screening mammogram for malignant neoplasm of breast: Secondary | ICD-10-CM

## 2019-10-20 ENCOUNTER — Other Ambulatory Visit: Payer: Self-pay

## 2019-10-20 ENCOUNTER — Encounter: Payer: Self-pay | Admitting: Orthopedic Surgery

## 2019-10-20 ENCOUNTER — Ambulatory Visit: Payer: Federal, State, Local not specified - PPO | Admitting: Orthopedic Surgery

## 2019-10-20 VITALS — BP 129/74 | HR 84 | Temp 96.8°F | Ht 66.0 in | Wt 164.0 lb

## 2019-10-20 DIAGNOSIS — M542 Cervicalgia: Secondary | ICD-10-CM

## 2019-10-20 DIAGNOSIS — R0789 Other chest pain: Secondary | ICD-10-CM

## 2019-10-20 DIAGNOSIS — G8929 Other chronic pain: Secondary | ICD-10-CM | POA: Diagnosis not present
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
## 2019-10-20 NOTE — Patient Instructions (Addendum)
Physical therapy has been ordered for you at [facility] [phone] is the phone number to call if you want to call to schedule. They will call you, but may expedite if you call them.    Costochondritis  Costochondritis is swelling and irritation (inflammation) of the tissue (cartilage) that connects your ribs to your breastbone (sternum). This causes pain in the front of your chest. The pain usually starts gradually and involves more than one rib. What are the causes? The exact cause of this condition is not always known. It results from stress on the cartilage where your ribs attach to your sternum. The cause of this stress could be:  Chest injury (trauma).  Exercise or activity, such as lifting.  Severe coughing. What increases the risk? You may be at higher risk for this condition if you:  Are female.  Are 68?66 years old.  Recently started a new exercise or work activity.  Have low levels of vitamin D.  Have a condition that makes you cough frequently. What are the signs or symptoms? The main symptom of this condition is chest pain. The pain:  Usually starts gradually and can be sharp or dull.  Gets worse with deep breathing, coughing, or exercise.  Gets better with rest.  May be worse when you press on the sternum-rib connection (tenderness). How is this diagnosed? This condition is diagnosed based on your symptoms, medical history, and a physical exam. Your health care provider will check for tenderness when pressing on your sternum. This is the most important finding. You may also have tests to rule out other causes of chest pain. These may include:  A chest X-ray to check for lung problems.  An electrocardiogram (ECG) to see if you have a heart problem that could be causing the pain.  An imaging scan to rule out a chest or rib fracture. How is this treated? This condition usually goes away on its own over time. Your health care provider may prescribe an NSAID  to reduce pain and inflammation. Your health care provider may also suggest that you:  Rest and avoid activities that make pain worse.  Apply heat or cold to the area to reduce pain and inflammation.  Do exercises to stretch your chest muscles. If these treatments do not help, your health care provider may inject a numbing medicine at the sternum-rib connection to help relieve the pain. Follow these instructions at home:  Avoid activities that make pain worse. This includes any activities that use chest, abdominal, and side muscles.  If directed, put ice on the painful area: ? Put ice in a plastic bag. ? Place a towel between your skin and the bag. ? Leave the ice on for 20 minutes, 2-3 times a day.  If directed, apply heat to the affected area as often as told by your health care provider. Use the heat source that your health care provider recommends, such as a moist heat pack or a heating pad. ? Place a towel between your skin and the heat source. ? Leave the heat on for 20-30 minutes. ? Remove the heat if your skin turns bright red. This is especially important if you are unable to feel pain, heat, or cold. You may have a greater risk of getting burned.  Take over-the-counter and prescription medicines only as told by your health care provider.  Return to your normal activities as told by your health care provider. Ask your health care provider what activities are safe for  you.  Keep all follow-up visits as told by your health care provider. This is important. Contact a health care provider if:  You have chills or a fever.  Your pain does not go away or it gets worse.  You have a cough that does not go away (is persistent). Get help right away if:  You have shortness of breath. This information is not intended to replace advice given to you by your health care provider. Make sure you discuss any questions you have with your health care provider. Document Released: 08/07/2005  Document Revised: 11/12/2017 Document Reviewed: 02/21/2016 Elsevier Patient Education  [employer].  Nonspecific Chest Pain Chest pain can be caused by many different conditions. Some causes of chest pain can be life-threatening. These will require treatment right away. Serious causes of chest pain include:  Heart attack.  A tear in the body's main blood vessel.  Redness and swelling (inflammation) around your heart.  Blood clot in your lungs. Other causes of chest pain may not be so serious. These include:  Heartburn.  Anxiety or stress.  Damage to bones or muscles in your chest.  Lung infections. Chest pain can feel like:  Pain or discomfort in your chest.  Crushing, pressure, aching, or squeezing pain.  Burning or tingling.  Dull or sharp pain that is worse when you move, cough, or take a deep breath.  Pain or discomfort that is also felt in your back, neck, jaw, shoulder, or arm, or pain that spreads to any of these areas. It is hard to know whether your pain is caused by something that is serious or something that is not so serious. So it is important to see your doctor right away if you have chest pain. Follow these instructions at home: Medicines  Take over-the-counter and prescription medicines only as told by your doctor.  If you were prescribed an antibiotic medicine, take it as told by your doctor. Do not stop taking the antibiotic even if you start to feel better. Lifestyle   Rest as told by your doctor.  Do not use any products that contain nicotine or tobacco, such as cigarettes, e-cigarettes, and chewing tobacco. If you need help quitting, ask your doctor.  Do not drink alcohol.  Make lifestyle changes as told by your doctor. These may include: ? Getting regular exercise. Ask your doctor what activities are safe for you. ? Eating a heart-healthy diet. A diet and nutrition specialist (dietitian) can help you to learn healthy eating  options. ? Staying at a healthy weight. ? Treating diabetes or high blood pressure, if needed. ? Lowering your stress. Activities such as yoga and relaxation techniques can help. General instructions  Pay attention to any changes in your symptoms. Tell your doctor about them or any new symptoms.  Avoid any activities that cause chest pain.  Keep all follow-up visits as told by your doctor. This is important. You may need more testing if your chest pain does not go away. Contact a doctor if:  Your chest pain does not go away.  You feel depressed.  You have a fever. Get help right away if:  Your chest pain is worse.  You have a cough that gets worse, or you cough up blood.  You have very bad (severe) pain in your belly (abdomen).  You pass out (faint).  You have either of these for no clear reason: ? Sudden chest discomfort. ? Sudden discomfort in your arms, back, neck, or jaw.  You  have shortness of breath at any time.  You suddenly start to sweat, or your skin gets clammy.  You feel sick to your stomach (nauseous).  You throw up (vomit).  You suddenly feel lightheaded or dizzy.  You feel very weak or tired.  Your heart starts to beat fast, or it feels like it is skipping beats. These symptoms may be an emergency. Do not wait to see if the symptoms will go away. Get medical help right away. Call your local emergency services (911 in the [place] Do not drive yourself to the hospital. Summary  Chest pain can be caused by many different conditions. The cause may be serious and need treatment right away. If you have chest pain, see your doctor right away.  Follow your doctor's instructions for taking medicines and making lifestyle changes.  Keep all follow-up visits as told by your doctor. This includes visits for any further testing if your chest pain does not go away.  Be sure to know the signs that show that your condition has become worse. Get help right away if  you have these symptoms. This information is not intended to replace advice given to you by your health care provider. Make sure you discuss any questions you have with your health care provider. Document Released: 04/15/2008 Document Revised: 04/30/2018 Document Reviewed: 04/30/2018 Elsevier Patient Education  2020 [employer].

## 2019-10-25 ENCOUNTER — Telehealth: Payer: Self-pay | Admitting: Orthopedic Surgery

## 2019-10-25 MED ORDER — MELOXICAM 7.5 MG PO TABS: 7.5000 mg | ORAL_TABLET | Freq: Every day | ORAL | 5 refills | Status: DC

## 2019-10-25 NOTE — Telephone Encounter (Signed)
SENT TODAY AT  11:13 AM

## 2019-10-25 NOTE — Addendum Note (Signed)
Addended by: Arther Abbott E on: 10/25/2019 11:12 AM   Modules accepted: Orders

## 2019-10-25 NOTE — Telephone Encounter (Signed)
I think you planned to send in Meloxicam It did not get sent

## 2019-10-25 NOTE — Telephone Encounter (Signed)
Patient called regarding medication that Dr Aline Brochure had prescribed at time of recent office visit 10/20/19 - states she's been checking with pharmacy, and is told no prescription received.  - [facility], [place] [phone]  - patient [phone]

## 2019-10-26 ENCOUNTER — Ambulatory Visit: Payer: Federal, State, Local not specified - PPO | Attending: Orthopedic Surgery | Admitting: Physical Therapy

## 2019-10-26 ENCOUNTER — Encounter: Payer: Self-pay | Admitting: Physical Therapy

## 2019-10-26 ENCOUNTER — Other Ambulatory Visit: Payer: Self-pay

## 2019-10-26 DIAGNOSIS — M6281 Muscle weakness (generalized): Secondary | ICD-10-CM | POA: Insufficient documentation

## 2019-10-26 DIAGNOSIS — M25611 Stiffness of right shoulder, not elsewhere classified: Secondary | ICD-10-CM | POA: Diagnosis present

## 2019-10-26 DIAGNOSIS — R252 Cramp and spasm: Secondary | ICD-10-CM

## 2019-10-26 DIAGNOSIS — M542 Cervicalgia: Secondary | ICD-10-CM | POA: Insufficient documentation
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
## 2019-10-26 NOTE — Patient Instructions (Signed)
Access Code: [email or web address]  URL: [email or web address]  Date: 10/26/2019  Prepared by: Jeral Pinch   Exercises Supine Pectoralis Stretch - 2 reps - 30 hold - 1x daily Supine Thoracic Mobilization Towel Roll Horizontal with Arm Stretch - 2 reps - 30 hold - 1x daily Supine Cervical Retraction with Towel - 10 reps - 5 hold - 1x daily Doorway Pec Stretch at 60 Degrees Abduction with Arm Straight - 3 reps - 45-60 hold - 1x daily Lateral Costal Breathing with Shoulder Abduction - 10 reps - 1-3 sets - 1x daily  Trigger Point Dry Needling  . What is Trigger Point Dry Needling (DN)? o DN is a physical therapy technique used to treat muscle pain and dysfunction. Specifically, DN helps deactivate muscle trigger points (muscle knots).  o A thin filiform needle is used to penetrate the skin and stimulate the underlying trigger point. The goal is for a local twitch response (LTR) to occur and for the trigger point to relax. No medication of any kind is injected during the procedure.   . What Does Trigger Point Dry Needling Feel Like?  o The procedure feels different for each individual patient. Some patients report that they do not actually feel the needle enter the skin and overall the process is not painful. Very mild bleeding may occur. However, many patients feel a deep cramping in the muscle in which the needle was inserted. This is the local twitch response.   Marland Kitchen How Will I feel after the treatment? o Soreness is normal, and the onset of soreness may not occur for a few hours. Typically this soreness does not last longer than two days.  o Bruising is uncommon, however; ice can be used to decrease any possible bruising.  o In rare cases feeling tired or nauseous after the treatment is normal. In addition, your symptoms may get worse before they get better, this period will typically not last longer than 24 hours.   . What Can I do After My Treatment? o Increase your hydration by drinking  more water for the next 24 hours. o You may place ice or heat on the areas treated that have become sore, however, do not use heat on inflamed or bruised areas. Heat often brings more relief post needling. o You can continue your regular activities, but vigorous activity is not recommended initially after the treatment for 24 hours. o DN is best combined with other physical therapy such as strengthening, stretching, and other therapies.

## 2019-10-28 ENCOUNTER — Encounter: Payer: Self-pay | Admitting: Physical Therapy

## 2019-10-28 ENCOUNTER — Ambulatory Visit: Payer: Federal, State, Local not specified - PPO | Admitting: Physical Therapy

## 2019-10-28 ENCOUNTER — Other Ambulatory Visit: Payer: Self-pay

## 2019-10-28 DIAGNOSIS — M25611 Stiffness of right shoulder, not elsewhere classified: Secondary | ICD-10-CM

## 2019-10-28 DIAGNOSIS — M542 Cervicalgia: Secondary | ICD-10-CM

## 2019-10-28 DIAGNOSIS — M6281 Muscle weakness (generalized): Secondary | ICD-10-CM

## 2019-10-28 DIAGNOSIS — R252 Cramp and spasm: Secondary | ICD-10-CM

## 2019-10-28 NOTE — Therapy (Signed)
[place], [place], [address] Phone: [phone]   Fax:  [phone]  Physical Therapy Treatment  Patient Details  Name: Margaret [name] MRN: [id number] Date of Birth: 08/14/1951 Referring Provider (PT): Dr Arther Abbott   Encounter Date: 10/28/2019  PT End of Session - 10/28/19 1701    Visit Number  2    Date for PT Re-Evaluation  12/07/19    Authorization Type  BCBS    PT Start Time  1600    PT Stop Time  1700    PT Time Calculation (min)  60 min    Activity Tolerance  Patient tolerated treatment well    Behavior During Therapy  [facility] for tasks assessed/performed       Past Medical History:  Diagnosis Date  . Allergy   . Anemia   . Colitis   . Dyspareunia   . Endometriosis   . GERD (gastroesophageal reflux disease)   . High cholesterol   . Hx of abnormal Pap smear 1980's  . Hypertension   . Osteopenia   . Ulcer   . Urinary incontinence     Past Surgical History:  Procedure Laterality Date  . ABDOMINAL HYSTERECTOMY  09/2008   TVH/BSO--Dr. Quincy Simmonds with TVT  . BLADDER SUSPENSION  09/2008   Dr. Quincy Simmonds  . CERVIX LESION DESTRUCTION  1980   hx abnormal pap--dysplasia  . COLONOSCOPY WITH PROPOFOL Left 10/25/2017   Procedure: COLONOSCOPY WITH PROPOFOL;  Surgeon: Ronnette Juniper, MD;  Location: [facility];  Service: Gastroenterology;  Laterality: Left;    There were no vitals filed for this visit.  Subjective Assessment - 10/28/19 1606    Subjective  Compliant with HEP, moving neck from side to side causes the greatest pain    Currently in Pain?  No/denies                       [facility] Adult PT Treatment/Exercise - 10/28/19 0001      Neck Exercises: Machines for Strengthening   UBE (Upper Arm Bike)  L2.5 3 min each way       Neck Exercises: Standing   Other Standing Exercises  Rows & Lats red tband 2x10     Other Standing Exercises  2lb shoulder Flex  2x10, shoulder abd 1lb 2x10       Neck Exercises: Seated   Neck Retraction  20 reps;3 secs    Other Seated Exercise  Resisted breathing 2x10     Other Seated Exercise  Shoulder ER reb 2x10       Moist Heat Therapy   Number Minutes Moist Heat  15 Minutes    Moist Heat Location  Cervical      Electrical Stimulation   Electrical Stimulation Location  IFC    Electrical Stimulation Action  IFC    Electrical Stimulation Parameters  to tolerance    Electrical Stimulation Goals  Tone                  PT Long Term Goals - 10/26/19 0950      PT LONG TERM GOAL #1   Title  I with advanced HEP to include taking posture breaks during work ( 12/07/2019)    Time  6    Period  Weeks    Status  New    Target Date  12/07/19      PT LONG TERM GOAL #2   Title  improve upper  back strength =/>5-/5 to assist with supporting upright posture ( 12/07/2019)    Time  6    Period  Weeks    Status  New    Target Date  12/07/19      PT LONG TERM GOAL #3   Title  improve bilat cervical motion =/> 75 degrees with  minimal to no pain ( 12/07/2019)    Time  6    Period  Weeks    Status  New    Target Date  12/07/19      PT LONG TERM GOAL #4   Title  report =/> 75% reduction in overall pain of neck and chest with daily activity ( 12/07/2019)    Time  6    Period  Weeks    Status  New    Target Date  12/07/19            Plan - 10/28/19 1701    Clinical Impression Statement  Pt tolerated an initial progression to TE we.. Some pain reported with cervical retractions. Postural cues needed with rows and extensions. Tactile cues to squeeze shoulder blades together with external rotation. Positive response to modalities.    Personal Factors and Comorbidities  Past/Current Experience;Comorbidity 3+    Comorbidities  HTN, osteopenia, OA, back pain allergies and anemia    Examination-Activity Limitations  Carry;Other    Rehab Potential  Good    PT Frequency  2x / week    PT Duration  6 weeks     PT Treatment/Interventions  Iontophoresis 4mg /ml Dexamethasone;Taping;Patient/family education;Moist Heat;Traction;Passive range of motion;Therapeutic activities;Ultrasound;Cryotherapy;Electrical Stimulation;Manual techniques;Therapeutic exercise    PT Next Visit Plan  possible DN to neck area, manual work for tone, postural work and modalities PRN       Patient will benefit from skilled therapeutic intervention in order to improve the following deficits and impairments:  Decreased range of motion, Impaired UE functional use, Increased muscle spasms, Pain, Decreased strength, Postural dysfunction  Visit Diagnosis: Muscle weakness (generalized)  Stiffness of right shoulder, not elsewhere classified  Cramp and spasm  Cervicalgia     Problem List Patient Active Problem List   Diagnosis Date Noted  . Closed nondisplaced fracture of proximal phalanx of lesser toe of left foot 11/19/2017  . GI bleed 10/23/2017  . Hypokalemia 10/23/2017  . Nausea vomiting and diarrhea 10/23/2017  . Hematochezia 10/23/2017  . 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 Back pain 04/12/2014  . Arthritis 04/12/2014  . Knee pain 04/12/2014  . Essential hypertension 11/27/2013  . Osteoarthritis of left knee 07/16/2012    Scot Jun, PTA 10/28/2019, 5:03 PM  [place], [place], [address] Phone: [phone]   Fax:  [phone]  Name: Hayleigh Liner MRN: [id number] Date of Birth: 10-11-1951

## 2019-11-02 ENCOUNTER — Ambulatory Visit: Payer: Federal, State, Local not specified - PPO | Admitting: Physical Therapy

## 2019-11-02 ENCOUNTER — Other Ambulatory Visit: Payer: Self-pay

## 2019-11-02 ENCOUNTER — Encounter: Payer: Self-pay | Admitting: Physical Therapy

## 2019-11-02 DIAGNOSIS — M6281 Muscle weakness (generalized): Secondary | ICD-10-CM

## 2019-11-02 DIAGNOSIS — M25611 Stiffness of right shoulder, not elsewhere classified: Secondary | ICD-10-CM

## 2019-11-02 DIAGNOSIS — M542 Cervicalgia: Secondary | ICD-10-CM | POA: Diagnosis not present

## 2019-11-02 DIAGNOSIS — R252 Cramp and spasm: Secondary | ICD-10-CM

## 2019-11-02 NOTE — Patient Instructions (Signed)
Access Code: [email or web address]  URL: [email or web address]  Date: 11/02/2019  Prepared by: Cheri Fowler   Exercises Seated Cervical Retraction - 10 reps - 3 sets - 1x daily - 7x weekly Standing Row with Resistance - 10 reps - 3 sets - 1x daily - 7x weekly Shoulder extension with resistance - Neutral - 10 reps - 3 sets - 1x daily - 7x weekly

## 2019-11-02 NOTE — Therapy (Signed)
[place], [place], [address] Phone: [phone]   Fax:  [phone]  Physical Therapy Treatment  Patient Details  Name: Margaret [name] MRN: [id number] Date of Birth: 1951/06/11 Referring Provider (PT): Dr Arther Abbott   Encounter Date: 11/02/2019  PT End of Session - 11/02/19 1556    Visit Number  3    Number of Visits  12    Date for PT Re-Evaluation  12/07/19    Authorization Type  BCBS    PT Start Time  1515    PT Stop Time  1610    PT Time Calculation (min)  55 min    Activity Tolerance  Patient tolerated treatment well    Behavior During Therapy  [facility] for tasks assessed/performed       Past Medical History:  Diagnosis Date  . Allergy   . Anemia   . Colitis   . Dyspareunia   . Endometriosis   . GERD (gastroesophageal reflux disease)   . High cholesterol   . Hx of abnormal Pap smear 1980's  . Hypertension   . Osteopenia   . Ulcer   . Urinary incontinence     Past Surgical History:  Procedure Laterality Date  . ABDOMINAL HYSTERECTOMY  09/2008   TVH/BSO--Dr. Quincy Simmonds with TVT  . BLADDER SUSPENSION  09/2008   Dr. Quincy Simmonds  . CERVIX LESION DESTRUCTION  1980   hx abnormal pap--dysplasia  . COLONOSCOPY WITH PROPOFOL Left 10/25/2017   Procedure: COLONOSCOPY WITH PROPOFOL;  Surgeon: Ronnette Juniper, MD;  [facility]: [facility];  Service: Gastroenterology;  Laterality: Left;    There were no vitals filed for this visit.  Subjective Assessment - 11/02/19 1519    Subjective  "Cant complain" Doing  fine    Currently in Pain?  Yes    Pain Score  4     Pain Location  Neck                       [facility] Adult PT Treatment/Exercise - 11/02/19 0001      Neck Exercises: Machines for Strengthening   UBE (Upper Arm Bike)  L2.5 3 min each way     Cybex Row  20lb 2x10    Lat Pull  20lb 2x10      Neck Exercises: Standing   Other Standing Exercises  Rows & ext  red  tband 2x10     Other Standing Exercises  Shoulder ER red 2x10; Shrugs 4lb 2x10 with levater stretch      Neck Exercises: Seated   Neck Retraction  20 reps;3 secs    Other Seated Exercise  Shoulder ER red 2x10       Moist Heat Therapy   Number Minutes Moist Heat  15 Minutes    Moist Heat Location  Cervical      Electrical Stimulation   Electrical Stimulation Location  Cervical spine    Electrical Stimulation Action  IFC    Electrical Stimulation Parameters  Supine to pt tolerance    Electrical Stimulation Goals  Pain                  PT Long Term Goals - 10/26/19 0950      PT LONG TERM GOAL #1   Title  I with advanced HEP to include taking posture breaks during work ( 12/07/2019)    Time  6    Period  Weeks    Status  New    Target Date  12/07/19      PT LONG TERM GOAL #2   Title  improve upper back strength =/>5-/5 to assist with supporting upright posture ( 12/07/2019)    Time  6    Period  Weeks    Status  New    Target Date  12/07/19      PT LONG TERM GOAL #3   Title  improve bilat cervical motion =/> 75 degrees with  minimal to no pain ( 12/07/2019)    Time  6    Period  Weeks    Status  New    Target Date  12/07/19      PT LONG TERM GOAL #4   Title  report =/> 75% reduction in overall pain of neck and chest with daily activity ( 12/07/2019)    Time  6    Period  Weeks    Status  New    Target Date  12/07/19            Plan - 11/02/19 1557    Clinical Impression Statement  No issues after last treatment session. Gave pt updated HEP. All interventions completed well, postural cues needed with lat pull downs. Some discomfort with cervical retractions but tolerable.    Comorbidities  HTN, osteopenia, OA, back pain allergies and anemia    Examination-Participation Restrictions  Other    Stability/Clinical Decision Making  Stable/Uncomplicated    Rehab Potential  Good    PT Frequency  2x / week    PT Duration  6 weeks    PT Treatment/Interventions   Iontophoresis 4mg /ml Dexamethasone;Taping;Patient/family education;Moist Heat;Traction;Passive range of motion;Therapeutic activities;Ultrasound;Cryotherapy;Electrical Stimulation;Manual techniques;Therapeutic exercise    PT Next Visit Plan  possible DN to neck area, manual work for tone, postural work and modalities PRN       Patient will benefit from skilled therapeutic intervention in order to improve the following deficits and impairments:  Decreased range of motion, Impaired UE functional use, Increased muscle spasms, Pain, Decreased strength, Postural dysfunction  Visit Diagnosis: Stiffness of right shoulder, not elsewhere classified  Muscle weakness (generalized)  Cramp and spasm     Problem List Patient Active Problem List   Diagnosis Date Noted  . Closed nondisplaced fracture of proximal phalanx of lesser toe of left foot 11/19/2017  . GI bleed 10/23/2017  . Hypokalemia 10/23/2017  . Nausea vomiting and diarrhea 10/23/2017  . Hematochezia 10/23/2017  . 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 Back pain 04/12/2014  . Arthritis 04/12/2014  . Knee pain 04/12/2014  . Essential hypertension 11/27/2013  . Osteoarthritis of left knee 07/16/2012    Scot Jun, PTA 11/02/2019, 3:59 PM  [place], [place], [address] Phone: [phone]   Fax:  [phone]  Name: Margaret [name] MRN: [id number] Date of Birth: 07/29/1951

## 2019-11-03 ENCOUNTER — Ambulatory Visit
Admission: RE | Admit: 2019-11-03 | Discharge: 2019-11-03 | Disposition: A | Discharge status: Home / Self Care | Payer: Federal, State, Local not specified - PPO | Source: Ambulatory Visit | Attending: Family Medicine | Admitting: Family Medicine

## 2019-11-03 DIAGNOSIS — Z1231 Encounter for screening mammogram for malignant neoplasm of breast: Secondary | ICD-10-CM

## 2019-11-04 ENCOUNTER — Ambulatory Visit: Payer: Federal, State, Local not specified - PPO | Admitting: Physical Therapy

## 2019-11-04 ENCOUNTER — Other Ambulatory Visit: Payer: Self-pay

## 2019-11-04 ENCOUNTER — Emergency Department (HOSPITAL_COMMUNITY): Payer: Federal, State, Local not specified - PPO

## 2019-11-04 ENCOUNTER — Emergency Department (HOSPITAL_COMMUNITY)
Admission: EM | Admit: 2019-11-04 | Discharge: 2019-11-04 | Disposition: A | Discharge status: Home / Self Care | Payer: Federal, State, Local not specified - PPO | Attending: Emergency Medicine | Admitting: Emergency Medicine

## 2019-11-04 DIAGNOSIS — I10 Essential (primary) hypertension: Secondary | ICD-10-CM | POA: Insufficient documentation

## 2019-11-04 DIAGNOSIS — R079 Chest pain, unspecified: Secondary | ICD-10-CM

## 2019-11-04 DIAGNOSIS — Z79899 Other long term (current) drug therapy: Secondary | ICD-10-CM | POA: Insufficient documentation

## 2019-11-04 DIAGNOSIS — R0789 Other chest pain: Secondary | ICD-10-CM

## 2019-11-04 LAB — CBC WITH DIFFERENTIAL/PLATELET
Abs Immature Granulocytes: 0.01 10*3/uL (ref 0.00–0.07)
Basophils Absolute: 0 10*3/uL (ref 0.0–0.1)
Basophils Relative: 1 %
Eosinophils Absolute: 0.3 10*3/uL (ref 0.0–0.5)
Eosinophils Relative: 8 %
HCT: 35.1 % — ABNORMAL LOW (ref 36.0–46.0)
Hemoglobin: 10.8 g/dL — ABNORMAL LOW (ref 12.0–15.0)
Immature Granulocytes: 0 %
Lymphocytes Relative: 53 %
Lymphs Abs: 2.3 10*3/uL (ref 0.7–4.0)
MCH: 26.4 pg (ref 26.0–34.0)
MCHC: 30.8 g/dL (ref 30.0–36.0)
MCV: 85.8 fL (ref 80.0–100.0)
Monocytes Absolute: 0.3 10*3/uL (ref 0.1–1.0)
Monocytes Relative: 8 %
Neutro Abs: 1.3 10*3/uL — ABNORMAL LOW (ref 1.7–7.7)
Neutrophils Relative %: 30 %
Platelets: 234 10*3/uL (ref 150–400)
RBC: 4.09 MIL/uL (ref 3.87–5.11)
RDW: 15 % (ref 11.5–15.5)
WBC: 4.2 10*3/uL (ref 4.0–10.5)
nRBC: 0 % (ref 0.0–0.2)

## 2019-11-04 LAB — BASIC METABOLIC PANEL
Anion gap: 10 (ref 5–15)
BUN: 15 mg/dL (ref 8–23)
CO2: 28 mmol/L (ref 22–32)
Calcium: 9 mg/dL (ref 8.9–10.3)
Chloride: 102 mmol/L (ref 98–111)
Creatinine, Ser: 0.64 mg/dL (ref 0.44–1.00)
GFR calc Af Amer: >60 mL/min (ref >60)
GFR calc non Af Amer: >60 mL/min (ref >60)
Glucose, Bld: 113 mg/dL — ABNORMAL HIGH (ref 70–99)
Potassium: 3.8 mmol/L (ref 3.5–5.1)
Sodium: 140 mmol/L (ref 135–145)

## 2019-11-04 LAB — TROPONIN I (HIGH SENSITIVITY)
Troponin I (High Sensitivity): 9 ng/L (ref <18)
Troponin I (High Sensitivity): 10 ng/L (ref <18)

## 2019-11-04 LAB — D-DIMER, QUANTITATIVE: D-Dimer, Quant: 0.37 ug/mL-FEU (ref 0.00–0.50)

## 2019-11-04 MED ORDER — SODIUM CHLORIDE 0.9 % IV SOLN: INTRAVENOUS | Status: DC

## 2019-11-04 NOTE — ED Notes (Signed)
Patient transported to X-ray 

## 2019-11-04 NOTE — ED Provider Notes (Signed)
[facility] EMERGENCY DEPARTMENT Provider Note   CSN: [id number] Arrival date & time: 11/04/19  [id number]     History Chief Complaint  Patient presents with  . Chest Pain    Margaret [name] is a 68 y.o. female.  68 year old female presents with sudden onset of left-sided sharp chest pain that began when she tried to stand up.  No radiation of her symptoms.  Symptoms were transient and since resolved.  She denies any associated dyspnea, diaphoresis, nausea or vomiting.  She took 324 of aspirin at home which seems to make her symptoms better.  Denies any recent fever or chills.  No cough or congestion.  No leg pain or swelling.  History of similar symptoms in the past but no etiology has been found.        Past Medical History:  Diagnosis Date  . Allergy   . Anemia   . Colitis   . Dyspareunia   . Endometriosis   . GERD (gastroesophageal reflux disease)   . High cholesterol   . Hx of abnormal Pap smear 1980's  . Hypertension   . Osteopenia   . Ulcer   . Urinary incontinence     Patient Active Problem List   Diagnosis Date Noted  . Closed nondisplaced fracture of proximal phalanx of lesser toe of left foot 11/19/2017  . GI bleed 10/23/2017  . Hypokalemia 10/23/2017  . Nausea vomiting and diarrhea 10/23/2017  . Hematochezia 10/23/2017  . 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 Back pain 04/12/2014  . Arthritis 04/12/2014  . Knee pain 04/12/2014  . Essential hypertension 11/27/2013  . Osteoarthritis of left knee 07/16/2012    Past Surgical History:  Procedure Laterality Date  . ABDOMINAL HYSTERECTOMY  09/2008   TVH/BSO--Dr. Quincy Simmonds with TVT  . BLADDER SUSPENSION  09/2008   Dr. Quincy Simmonds  . CERVIX LESION DESTRUCTION  1980   hx abnormal pap--dysplasia  . COLONOSCOPY WITH PROPOFOL Left 10/25/2017   Procedure: COLONOSCOPY WITH PROPOFOL;  Surgeon: Ronnette Juniper, MD;  Location: [facility];  Service: Gastroenterology;  Laterality:  Left;     OB History    Gravida  3   Para  3   Term  3   Preterm      AB      Living  3     SAB      TAB      Ectopic      Multiple      Live Births              Family History  Problem Relation Age of Onset  . Diabetes Mother   . Hypertension Mother   . Thyroid disease Sister   . Seizures Brother   . Diabetes Brother   . Hypertension Brother   . Diabetes Brother   . Arthritis Other   . Diabetes Other   . Breast cancer Cousin     Social History   Tobacco Use  . Smoking status: Never Smoker  . Smokeless tobacco: Never Used  Substance Use Topics  . Alcohol use: Yes    Comment: rare  . Drug use: No    Home Medications Prior to Admission medications   Medication Sig Start Date End Date Taking? Authorizing Provider  Cholecalciferol (VITAMIN D) 2000 UNITS CAPS Take 2,000 Units by mouth daily.     [provider]  conjugated estrogens (PREMARIN) vaginal cream Use 1/2 g vaginally every night at bed time for the first 2 weeks,  then use 1/2 g vaginally two or three times per week as needed to maintain symptom relief. 08/06/19   Nunzio Cobbs, MD  Ferrous Sulfate (IRON) 325 (65 Fe) MG TABS Take 325 mg by mouth daily.     [provider]  fexofenadine (ALLEGRA) 180 MG tablet Take 180 mg by mouth daily as needed for allergies.     [provider]  fluticasone (FLONASE) 50 MCG/ACT nasal spray Place 1 spray into both nostrils daily as needed for allergies or rhinitis.     [provider]  lisinopril-hydrochlorothiazide (PRINZIDE,ZESTORETIC) 10-12.5 MG per tablet Take 1 tablet by mouth daily. 05/05/15   [provider]  meloxicam (MOBIC) 7.5 MG tablet Take 1 tablet (7.5 mg total) by mouth daily. 10/25/19   Carole Civil, MD  Multiple Vitamins-Minerals (MULTIVITAMIN ADULT PO) Take 1 tablet by mouth daily.    [provider]  omeprazole (PRILOSEC) 40 MG capsule Take 40 mg by mouth daily.     [provider]  OVER THE COUNTER MEDICATION Apply 1 application topically daily as needed (knee pain). MAGNESIUM OIL APPLIED TO KNEE FOR ARTHRITIS PAIN    [provider]  terbinafine (LAMISIL) 250 MG tablet Take 1 tablet (250 mg total) by mouth daily. 08/24/18   Edrick Kins, DPM    Allergies    Ciprofloxacin and Sulfa antibiotics  Review of Systems   Review of Systems  All other systems reviewed and are negative.   Physical Exam Updated Vital Signs There were no vitals taken for this visit.  Physical Exam Vitals and nursing note reviewed.  Constitutional:      General: She is not in acute distress.    Appearance: Normal appearance. She is well-developed. She is not toxic-appearing.  HENT:     Head: Normocephalic and atraumatic.  Eyes:     General: Lids are normal.     Conjunctiva/sclera: Conjunctivae normal.     Pupils: Pupils are equal, round, and reactive to light.  Neck:     Thyroid: No thyroid mass.     Trachea: No tracheal deviation.  Cardiovascular:     Rate and Rhythm: Normal rate and regular rhythm.     Heart sounds: Normal heart sounds. No murmur. No gallop.   Pulmonary:     Effort: Pulmonary effort is normal. No respiratory distress.     Breath sounds: Normal breath sounds. No stridor. No decreased breath sounds, wheezing, rhonchi or rales.  Abdominal:     General: Bowel sounds are normal. There is no distension.     Palpations: Abdomen is soft.     Tenderness: There is no abdominal tenderness. There is no rebound.  Musculoskeletal:        General: No tenderness. Normal range of motion.     Cervical back: Normal range of motion and neck supple.  Skin:    General: Skin is warm and dry.     Findings: No abrasion or rash.  Neurological:     Mental Status: She is alert and oriented to person, place, and time.     GCS: GCS eye subscore is 4. GCS verbal subscore is 5. GCS motor subscore is 6.     Cranial Nerves: No cranial nerve deficit.      Sensory: No sensory deficit.  Psychiatric:        Speech: Speech normal.        Behavior: Behavior normal.     ED Results / Procedures / Treatments  Labs (all labs ordered are listed, but only abnormal results are displayed) Labs Reviewed  CBC WITH DIFFERENTIAL/PLATELET  BASIC METABOLIC PANEL  D-DIMER, QUANTITATIVE (NOT AT [facility])  TROPONIN I (HIGH SENSITIVITY)    EKG None  Radiology MM 3D SCREEN BREAST BILATERAL  Result Date: 11/03/2019 CLINICAL DATA:  Screening. 0 EXAM: DIGITAL SCREENING BILATERAL MAMMOGRAM WITH TOMO AND CAD COMPARISON:  Previous exam(s). ACR Breast Density Category b: There are scattered areas of fibroglandular density. FINDINGS: There are no findings suspicious for malignancy. Images were processed with CAD. IMPRESSION: No mammographic evidence of malignancy. A result letter of this screening mammogram will be mailed directly to the patient. RECOMMENDATION: Screening mammogram in one year. (Code:SM-B-01Y) BI-RADS CATEGORY  1: Negative. Electronically Signed   By: Fidela Salisbury M.D.   On: 11/03/2019 12:37    Procedures Procedures (including critical care time)  Medications Ordered in ED Medications  0.9 %  sodium chloride infusion (has no administration in time range)    ED Course  I have reviewed the triage vital signs and the nursing notes.  Pertinent labs & imaging results that were available during my care of the patient were reviewed by me and considered in my medical decision making (see chart for details).    MDM Rules/Calculators/A&P                      Patient is delta troponin and D-dimer along with labs are reassuring here.  Chest x-ray without acute findings.  EKG reassuring.  Will discharge home with return precautions Final Clinical Impression(s) / ED Diagnoses Final diagnoses:  Chest pain    Rx / DC Orders ED Discharge Orders    None       Lacretia Leigh, MD 11/04/19 1147

## 2019-11-04 NOTE — ED Triage Notes (Signed)
From home via ems; sudden onset L sided cp no radiation, described as stabbing; sob, no n/v; pain ceased prior to ems arrival; pt took 324 asa at home; 12 lead unremarkable with ems; hx HTN, GERD  134/88 HR 77 sinus 99% RA RR 16-18

## 2019-11-08 ENCOUNTER — Other Ambulatory Visit: Payer: Self-pay

## 2019-11-08 ENCOUNTER — Ambulatory Visit: Payer: Federal, State, Local not specified - PPO | Admitting: Physical Therapy

## 2019-11-08 ENCOUNTER — Encounter: Payer: Self-pay | Admitting: Physical Therapy

## 2019-11-08 DIAGNOSIS — M25611 Stiffness of right shoulder, not elsewhere classified: Secondary | ICD-10-CM

## 2019-11-08 DIAGNOSIS — M542 Cervicalgia: Secondary | ICD-10-CM | POA: Diagnosis not present

## 2019-11-08 DIAGNOSIS — M6281 Muscle weakness (generalized): Secondary | ICD-10-CM

## 2019-11-08 DIAGNOSIS — R252 Cramp and spasm: Secondary | ICD-10-CM

## 2019-11-08 NOTE — Therapy (Signed)
[place], [place], [address] Phone: [phone]   Fax:  [phone]  Physical Therapy Treatment  Patient Details  Name: Margaret [name] MRN: [id number] Date of Birth: 1951-01-01 Referring Provider (PT): Dr Arther Abbott   Encounter Date: 11/08/2019  PT End of Session - 11/08/19 1648    Visit Number  4    Number of Visits  12    Date for PT Re-Evaluation  12/07/19    Authorization - Number of Visits  50    PT Start Time  1600    PT Stop Time  1701    PT Time Calculation (min)  61 min    Activity Tolerance  Patient tolerated treatment well    Behavior During Therapy  [facility] for tasks assessed/performed       Past Medical History:  Diagnosis Date  . Allergy   . Anemia   . Colitis   . Dyspareunia   . Endometriosis   . GERD (gastroesophageal reflux disease)   . High cholesterol   . Hx of abnormal Pap smear 1980's  . Hypertension   . Osteopenia   . Ulcer   . Urinary incontinence     Past Surgical History:  Procedure Laterality Date  . ABDOMINAL HYSTERECTOMY  09/2008   TVH/BSO--Dr. Quincy Simmonds with TVT  . BLADDER SUSPENSION  09/2008   Dr. Quincy Simmonds  . CERVIX LESION DESTRUCTION  1980   hx abnormal pap--dysplasia  . COLONOSCOPY WITH PROPOFOL Left 10/25/2017   Procedure: COLONOSCOPY WITH PROPOFOL;  Surgeon: Ronnette Juniper, MD;  Location: [facility];  Service: Gastroenterology;  Laterality: Left;    There were no vitals filed for this visit.  Subjective Assessment - 11/08/19 1606    Subjective  "Right now  am feeling ok"    Currently in Pain?  Yes    Pain Score  2     Pain Location  Neck         [facility] PT Assessment - 11/08/19 0001      AROM   Cervical Flexion  26    Cervical Extension  70    Cervical - Right Rotation  65    Cervical - Left Rotation  63    Thoracic Flexion  WNL                   [facility] Adult PT Treatment/Exercise - 11/08/19 0001      Neck Exercises: Machines for Strengthening   UBE (Upper Arm Bike)  L2.5 3 min each way     Cybex Row  25lb 2x10    Lat Pull  15lb 2x10     Other Machines for Strengthening  Chest press 5lb 2x10       Neck Exercises: Standing   Wall Push Ups  10 reps    Other Standing Exercises  Rows & ext  red tband 2x10     Other Standing Exercises  Shoulder ER red 2x10; Shrugs 4lb 2x10 with levater stretch      Neck Exercises: Seated   Neck Retraction  20 reps;3 secs      Moist Heat Therapy   Number Minutes Moist Heat  15 Minutes    Moist Heat Location  Cervical      Electrical Stimulation   Electrical Stimulation Location  Cervical spine    Electrical Stimulation Action  IFc    Electrical Stimulation Parameters  supine    Electrical Stimulation Goals  Pain  PT Long Term Goals - 11/08/19 1614      PT LONG TERM GOAL #1   Title  I with advanced HEP to include taking posture breaks during work ( 12/07/2019)    Status  Achieved      PT LONG TERM GOAL #3   Title  improve bilat cervical motion =/> 75 degrees with  minimal to no pain ( 12/07/2019)    Status  Partially Met      PT LONG TERM GOAL #4   Title  report =/> 75% reduction in overall pain of neck and chest with daily activity ( 12/07/2019)    Status  Partially Met            Plan - 11/08/19 1649    Clinical Impression Statement  Pt was able to complete all of today's interventions. Decreased the weight with lat pull downs due pt subjective reports of pain from last session. She was able to complete chest press under light resistance without issue. Again some discomfort with cervical retractions. She has progressed increasing her cervical ROM in some planes.    Comorbidities  HTN, osteopenia, OA, back pain allergies and anemia    Examination-Activity Limitations  Carry;Other    Examination-Participation Restrictions  Other    Stability/Clinical Decision Making  Stable/Uncomplicated    Rehab Potential   Good    PT Frequency  2x / week    PT Duration  6 weeks    PT Treatment/Interventions  Iontophoresis 39m/ml Dexamethasone;Taping;Patient/family education;Moist Heat;Traction;Passive range of motion;Therapeutic activities;Ultrasound;Cryotherapy;Electrical Stimulation;Manual techniques;Therapeutic exercise    PT Next Visit Plan  possible DN to neck area, manual work for tone, postural work and modalities PRN       Patient will benefit from skilled therapeutic intervention in order to improve the following deficits and impairments:  Decreased range of motion, Impaired UE functional use, Increased muscle spasms, Pain, Decreased strength, Postural dysfunction  Visit Diagnosis: Muscle weakness (generalized)  Cramp and spasm  Cervicalgia  Stiffness of right shoulder, not elsewhere classified     Problem List Patient Active Problem List   Diagnosis Date Noted  . Closed nondisplaced fracture of proximal phalanx of lesser toe of left foot 11/19/2017  . GI bleed 10/23/2017  . Hypokalemia 10/23/2017  . Nausea vomiting and diarrhea 10/23/2017  . Hematochezia 10/23/2017  . 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 Back pain 04/12/2014  . Arthritis 04/12/2014  . Knee pain 04/12/2014  . Essential hypertension 11/27/2013  . Osteoarthritis of left knee 07/16/2012    RScot Jun PTA 11/08/2019, 4:50 PM  Margaret [name]  Fax:  [phone] Name: Margaret BodieMRN: [id number] of Birth: [birth year]

## 2019-11-10 ENCOUNTER — Other Ambulatory Visit: Payer: Self-pay

## 2019-11-10 ENCOUNTER — Encounter: Payer: Self-pay | Admitting: Physical Therapy

## 2019-11-10 ENCOUNTER — Ambulatory Visit: Payer: Federal, State, Local not specified - PPO | Admitting: Physical Therapy

## 2019-11-10 DIAGNOSIS — M25611 Stiffness of right shoulder, not elsewhere classified: Secondary | ICD-10-CM

## 2019-11-10 DIAGNOSIS — M542 Cervicalgia: Secondary | ICD-10-CM | POA: Diagnosis not present

## 2019-11-10 DIAGNOSIS — M6281 Muscle weakness (generalized): Secondary | ICD-10-CM

## 2019-11-10 DIAGNOSIS — R252 Cramp and spasm: Secondary | ICD-10-CM

## 2019-11-10 NOTE — Therapy (Signed)
[place], [place], [address] Phone: [phone]   Fax:  [phone]  Physical Therapy Treatment  Patient Details  Name: Margaret [name] MRN: [id number] Date of Birth: 06-25-1951 Referring Provider (PT): Dr Arther Abbott   Encounter Date: 11/10/2019  PT End of Session - 11/10/19 1639    Visit Number  5    Number of Visits  12    Date for PT Re-Evaluation  12/07/19    Authorization Type  BCBS    PT Start Time  1600    PT Stop Time  1654    PT Time Calculation (min)  54 min    Activity Tolerance  Patient tolerated treatment well    Behavior During Therapy  [facility] for tasks assessed/performed       Past Medical History:  Diagnosis Date  . Allergy   . Anemia   . Colitis   . Dyspareunia   . Endometriosis   . GERD (gastroesophageal reflux disease)   . High cholesterol   . Hx of abnormal Pap smear 1980's  . Hypertension   . Osteopenia   . Ulcer   . Urinary incontinence     Past Surgical History:  Procedure Laterality Date  . ABDOMINAL HYSTERECTOMY  09/2008   TVH/BSO--Dr. Quincy Simmonds with TVT  . BLADDER SUSPENSION  09/2008   Dr. Quincy Simmonds  . CERVIX LESION DESTRUCTION  1980   hx abnormal pap--dysplasia  . COLONOSCOPY WITH PROPOFOL Left 10/25/2017   Procedure: COLONOSCOPY WITH PROPOFOL;  Surgeon: Ronnette Juniper, MD;  Location: [facility];  Service: Gastroenterology;  Laterality: Left;    There were no vitals filed for this visit.  Subjective Assessment - 11/10/19 1604    Subjective  "I have been tired lately"  R arm has  been hurting.    Currently in Pain?  Yes    Pain Score  2                        [facility] Adult PT Treatment/Exercise - 11/10/19 0001      Neck Exercises: Standing   Neck Retraction  3 secs;20 reps   ball on wall    Wall Push Ups  10 reps    Other Standing Exercises  Rows & ext green tband 2x10     Other Standing Exercises  Horiz and Red  2x10, ER back against wall red 2x10      Neck Exercises: Seated   Shoulder Rolls  Backwards;20 reps    Shoulder Rolls Limitations  3      Moist Heat Therapy   Number Minutes Moist Heat  15 Minutes    Moist Heat Location  Cervical      Electrical Stimulation   Electrical Stimulation Location  Cervical spine    Electrical Stimulation Action  IFC    Electrical Stimulation Parameters  supine    Electrical Stimulation Goals  Pain      Manual Therapy   Manual Therapy  Neural Stretch    Neural Stretch  Ulnar & Radial nerve glide LUE                  PT Long Term Goals - 11/08/19 1614      PT LONG TERM GOAL #1   Title  I with advanced HEP to include taking posture breaks during work ( 12/07/2019)    Status  Achieved      PT LONG TERM GOAL #  3   Title  improve bilat cervical motion =/> 75 degrees with  minimal to no pain ( 12/07/2019)    Status  Partially Met      PT LONG TERM GOAL #4   Title  report =/> 75% reduction in overall pain of neck and chest with daily activity ( 12/07/2019)    Status  Partially Met            Plan - 11/10/19 1641    Clinical Impression Statement  Added some nerve glide due to pt subjective reports of running pain in the L fore arm with aerobic warm up. Added some resistance to neck retractions without reports of pain, but she did require tactile cues to prevent leaning entire body into wall. No issue reported with the increase resistance to rows and extensions.    Personal Factors and Comorbidities  Past/Current Experience;Comorbidity 3+    Comorbidities  HTN, osteopenia, OA, back pain allergies and anemia    Examination-Activity Limitations  Carry;Other    Examination-Participation Restrictions  Other    Stability/Clinical Decision Making  Stable/Uncomplicated    Rehab Potential  Good    PT Frequency  2x / week    PT Duration  6 weeks    PT Treatment/Interventions  Iontophoresis 50m/ml Dexamethasone;Taping;Patient/family education;Moist  Heat;Traction;Passive range of motion;Therapeutic activities;Ultrasound;Cryotherapy;Electrical Stimulation;Manual techniques;Therapeutic exercise    PT Next Visit Plan  possible DN to neck area, manual work for tone, postural work and modalities PRN       Patient will benefit from skilled therapeutic intervention in order to improve the following deficits and impairments:  Decreased range of motion, Impaired UE functional use, Increased muscle spasms, Pain, Decreased strength, Postural dysfunction  Visit Diagnosis: Muscle weakness (generalized)  Cervicalgia  Stiffness of right shoulder, not elsewhere classified  Cramp and spasm     Problem List Patient Active Problem List   Diagnosis Date Noted  . Closed nondisplaced fracture of proximal phalanx of lesser toe of left foot 11/19/2017  . GI bleed 10/23/2017  . Hypokalemia 10/23/2017  . Nausea vomiting and diarrhea 10/23/2017  . Hematochezia 10/23/2017  . 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 Back pain 04/12/2014  . Arthritis 04/12/2014  . Knee pain 04/12/2014  . Essential hypertension 11/27/2013  . Osteoarthritis of left knee 07/16/2012    RScot Jun PTA 11/10/2019, 4:46 PM  [place] [address]Phone: [phone]  Fax:  [phone] Name: Margaret HillmerMRN: [id number] of Birth: 706/27/1952

## 2019-11-15 ENCOUNTER — Other Ambulatory Visit: Payer: Self-pay

## 2019-11-15 ENCOUNTER — Encounter: Payer: Self-pay | Admitting: Physical Therapy

## 2019-11-15 ENCOUNTER — Ambulatory Visit: Payer: Federal, State, Local not specified - PPO | Attending: Orthopedic Surgery | Admitting: Physical Therapy

## 2019-11-15 DIAGNOSIS — R252 Cramp and spasm: Secondary | ICD-10-CM | POA: Diagnosis present

## 2019-11-15 DIAGNOSIS — M25511 Pain in right shoulder: Secondary | ICD-10-CM | POA: Diagnosis present

## 2019-11-15 DIAGNOSIS — M25611 Stiffness of right shoulder, not elsewhere classified: Secondary | ICD-10-CM | POA: Diagnosis present

## 2019-11-15 DIAGNOSIS — M542 Cervicalgia: Secondary | ICD-10-CM | POA: Diagnosis present

## 2019-11-15 DIAGNOSIS — M6281 Muscle weakness (generalized): Secondary | ICD-10-CM

## 2019-11-15 NOTE — Therapy (Signed)
[place], [place], [address] Phone: [phone]   Fax:  [phone]  Physical Therapy Treatment  Patient Details  Name: Margaret [name] MRN: [id number] Date of Birth: Aug 13, 1951 Referring Provider (PT): Dr Arther Abbott   Encounter Date: 11/15/2019  PT End of Session - 11/15/19 1735    Visit Number  6    Date for PT Re-Evaluation  12/07/19    Authorization Type  BCBS    Authorization - Number of Visits  50    PT Start Time  1700    PT Stop Time  0881    PT Time Calculation (min)  47 min    Activity Tolerance  Patient tolerated treatment well    Behavior During Therapy  Anxious       Past Medical History:  Diagnosis Date  . Allergy   . Anemia   . Colitis   . Dyspareunia   . Endometriosis   . GERD (gastroesophageal reflux disease)   . High cholesterol   . Hx of abnormal Pap smear 1980's  . Hypertension   . Osteopenia   . Ulcer   . Urinary incontinence     Past Surgical History:  Procedure Laterality Date  . ABDOMINAL HYSTERECTOMY  09/2008   TVH/BSO--Dr. Quincy Simmonds with TVT  . BLADDER SUSPENSION  09/2008   Dr. Quincy Simmonds  . CERVIX LESION DESTRUCTION  1980   hx abnormal pap--dysplasia  . COLONOSCOPY WITH PROPOFOL Left 10/25/2017   Procedure: COLONOSCOPY WITH PROPOFOL;  Surgeon: Ronnette Juniper, MD;  Location: [facility];  Service: Gastroenterology;  Laterality: Left;    There were no vitals filed for this visit.  Subjective Assessment - 11/15/19 1704    Subjective  Patient continues to report chest pain, neck and right arm pain    Currently in Pain?  Yes    Pain Location  Neck    Aggravating Factors   moving head, reaching out                       [facility] Adult PT Treatment/Exercise - 11/15/19 0001      Neck Exercises: Machines for Strengthening   UBE (Upper Arm Bike)  L3 3 min each way     Cybex Row  20# 2x10    Lat Pull  20# 2x10    Other  Machines for Strengthening  Chest press 5lb 2x10       Neck Exercises: Supine   Neck Retraction  10 reps;3 secs    Neck Retraction Limitations  then did partial rotation with the retraction      Moist Heat Therapy   Number Minutes Moist Heat  10 Minutes    Moist Heat Location  Cervical      Electrical Stimulation   Electrical Stimulation Location  Cervical spine    Electrical Stimulation Action  IFC    Electrical Stimulation Parameters  supine    Electrical Stimulation Goals  Pain      Manual Therapy   Manual Therapy  Manual Traction;Neural Stretch    Manual Traction  occipital release, some manual stretch of the cervical spine with shoulder depression, some upper trap and levator stretch    Neural Stretch  Ulnar & Radial nerve glide LUE                  PT Long Term Goals - 11/15/19 1738      PT LONG TERM GOAL #3  Title  improve bilat cervical motion =/> 75 degrees with  minimal to no pain ( 12/07/2019)    Status  Partially Met            Plan - 11/15/19 1737    Clinical Impression Statement  Patient is very anxious about any new treatment, including DN and traction, I performed a little more manual treatment with her today and showed her the traction unit, she seemed like she would be receptive to the traction after explanation.  she is very tight in the upper traps c/o pain with the neural tension stretches    PT Next Visit Plan  could try traction    Consulted and Agree with Plan of Care  Patient       Patient will benefit from skilled therapeutic intervention in order to improve the following deficits and impairments:  Decreased range of motion, Impaired UE functional use, Increased muscle spasms, Pain, Decreased strength, Postural dysfunction  Visit Diagnosis: Muscle weakness (generalized)  Cervicalgia  Stiffness of right shoulder, not elsewhere classified  Cramp and spasm  Acute pain of right shoulder     Problem List Patient Active  Problem List   Diagnosis Date Noted  . Closed nondisplaced fracture of proximal phalanx of lesser toe of left foot 11/19/2017  . GI bleed 10/23/2017  . Hypokalemia 10/23/2017  . Nausea vomiting and diarrhea 10/23/2017  . Hematochezia 10/23/2017  . 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 Back pain 04/12/2014  . Arthritis 04/12/2014  . Knee pain 04/12/2014  . Essential hypertension 11/27/2013  . Osteoarthritis of left knee 07/16/2012    Sumner Boast., PT 11/15/2019, 5:39 PM  [place] [address] [place], [place], [address] Phone: [phone]   Fax:  [phone]  Name: Margaret [name] MRN: [id number] Date of Birth: [birth year]

## 2019-11-17 ENCOUNTER — Ambulatory Visit: Payer: Federal, State, Local not specified - PPO | Admitting: Physical Therapy

## 2019-11-17 ENCOUNTER — Encounter: Payer: Self-pay | Admitting: Physical Therapy

## 2019-11-17 ENCOUNTER — Other Ambulatory Visit: Payer: Self-pay

## 2019-11-17 DIAGNOSIS — M542 Cervicalgia: Secondary | ICD-10-CM

## 2019-11-17 DIAGNOSIS — M6281 Muscle weakness (generalized): Secondary | ICD-10-CM | POA: Diagnosis not present

## 2019-11-17 DIAGNOSIS — M25611 Stiffness of right shoulder, not elsewhere classified: Secondary | ICD-10-CM

## 2019-11-17 NOTE — Therapy (Signed)
[place], [place], [address] Phone: [phone]   Fax:  [phone]  Physical Therapy Treatment  Patient Details  Name: Margaret [name] MRN: [id number] Date of Birth: 10/21/1951 Referring Provider (PT): Dr Arther Abbott   Encounter Date: 11/17/2019  PT End of Session - 11/17/19 1646    Visit Number  7    Date for PT Re-Evaluation  12/07/19    Authorization Type  BCBS    Authorization - Number of Visits  50    PT Start Time  1600    PT Stop Time  4580    PT Time Calculation (min)  59 min    Activity Tolerance  Patient tolerated treatment well    Behavior During Therapy  [facility] for tasks assessed/performed       Past Medical History:  Diagnosis Date  . Allergy   . Anemia   . Colitis   . Dyspareunia   . Endometriosis   . GERD (gastroesophageal reflux disease)   . High cholesterol   . Hx of abnormal Pap smear 1980's  . Hypertension   . Osteopenia   . Ulcer   . Urinary incontinence     Past Surgical History:  Procedure Laterality Date  . ABDOMINAL HYSTERECTOMY  09/2008   TVH/BSO--Dr. Quincy Simmonds with TVT  . BLADDER SUSPENSION  09/2008   Dr. Quincy Simmonds  . CERVIX LESION DESTRUCTION  1980   hx abnormal pap--dysplasia  . COLONOSCOPY WITH PROPOFOL Left 10/25/2017   Procedure: COLONOSCOPY WITH PROPOFOL;  Surgeon: Ronnette Juniper, MD;  Location: [facility];  Service: Gastroenterology;  Laterality: Left;    There were no vitals filed for this visit.  Subjective Assessment - 11/17/19 1605    Subjective  "I been feeling good"    Currently in Pain?  No/denies                       [facility] Adult PT Treatment/Exercise - 11/17/19 0001      Neck Exercises: Machines for Strengthening   UBE (Upper Arm Bike)  L3 2 min each way     Nustep  L3 x 5 min     Cybex Row  20# 2x10    Lat Pull  20# 2x10    Other Machines for Strengthening  Chest press 5lb 2x10       Neck  Exercises: Supine   Neck Retraction  3 secs;20 reps    Neck Retraction Limitations  then did partial rotation with the retraction      Moist Heat Therapy   Number Minutes Moist Heat  10 Minutes    Moist Heat Location  Cervical      Electrical Stimulation   Electrical Stimulation Location  Cervical spine    Electrical Stimulation Action  IFCIFC    Electrical Stimulation Parameters  supine    Electrical Stimulation Goals  Pain      Manual Therapy   Manual Therapy  Manual Traction;Neural Stretch    Manual Traction  occipital release, some manual stretch of the cervical spine with shoulder depression, some upper trap and levator stretch                  PT Long Term Goals - 11/15/19 1738      PT LONG TERM GOAL #3   Title  improve bilat cervical motion =/> 75 degrees with  minimal to no pain ( 12/07/2019)    Status  Partially  Met            Plan - 11/17/19 1647    Clinical Impression Statement  Tightness in the upper traps remains. Continued with MT that included traction to the cervical spine. Cues needed to retract shoulder with seated rows. She did report a good stretch with the cervical rotation. No increase in pain reported.    Comorbidities  HTN, osteopenia, OA, back pain allergies and anemia    Examination-Activity Limitations  Carry;Other    Examination-Participation Restrictions  Other    Stability/Clinical Decision Making  Stable/Uncomplicated    Rehab Potential  Good    PT Frequency  2x / week    PT Duration  6 weeks    PT Treatment/Interventions  Iontophoresis 20m/ml Dexamethasone;Taping;Patient/family education;Moist Heat;Traction;Passive range of motion;Therapeutic activities;Ultrasound;Cryotherapy;Electrical Stimulation;Manual techniques;Therapeutic exercise    PT Next Visit Plan  assess Tx could add traction       Patient will benefit from skilled therapeutic intervention in order to improve the following deficits and impairments:  Decreased range  of motion, Impaired UE functional use, Increased muscle spasms, Pain, Decreased strength, Postural dysfunction  Visit Diagnosis: Cervicalgia  Muscle weakness (generalized)  Stiffness of right shoulder, not elsewhere classified     Problem List Patient Active Problem List   Diagnosis Date Noted  . Closed nondisplaced fracture of proximal phalanx of lesser toe of left foot 11/19/2017  . GI bleed 10/23/2017  . Hypokalemia 10/23/2017  . Nausea vomiting and diarrhea 10/23/2017  . Hematochezia 10/23/2017  . 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 Back pain 04/12/2014  . Arthritis 04/12/2014  . Knee pain 04/12/2014  . Essential hypertension 11/27/2013  . Osteoarthritis of left knee 07/16/2012    RScot Jun PTA 11/17/2019, 4:50 PM  [place] [address]Phone: [phone]  Fax:  [phone] Name: Margaret NodarseMRN: [id number] of Birth: [birth year]

## 2019-11-22 ENCOUNTER — Ambulatory Visit: Payer: Federal, State, Local not specified - PPO | Admitting: Physical Therapy

## 2019-11-24 ENCOUNTER — Encounter: Payer: Self-pay | Admitting: Physical Therapy

## 2019-11-24 ENCOUNTER — Ambulatory Visit: Payer: Federal, State, Local not specified - PPO | Admitting: Physical Therapy

## 2019-11-24 ENCOUNTER — Other Ambulatory Visit: Payer: Self-pay

## 2019-11-24 DIAGNOSIS — M25611 Stiffness of right shoulder, not elsewhere classified: Secondary | ICD-10-CM

## 2019-11-24 DIAGNOSIS — M25511 Pain in right shoulder: Secondary | ICD-10-CM

## 2019-11-24 DIAGNOSIS — M542 Cervicalgia: Secondary | ICD-10-CM

## 2019-11-24 DIAGNOSIS — M6281 Muscle weakness (generalized): Secondary | ICD-10-CM | POA: Diagnosis not present

## 2019-11-24 DIAGNOSIS — R252 Cramp and spasm: Secondary | ICD-10-CM

## 2019-11-24 NOTE — Therapy (Signed)
Margaret [name], [place], [address] Phone: [phone]   Fax:  [phone]  Physical Therapy Treatment  Patient Details  Name: Margaret [name] MRN: [id number] Date of Birth: 1951-09-15 Referring Provider (PT): Dr Arther Abbott   Encounter Date: 11/24/2019    Past Medical History:  Diagnosis Date  . Allergy   . Anemia   . Colitis   . Dyspareunia   . Endometriosis   . GERD (gastroesophageal reflux disease)   . High cholesterol   . Hx of abnormal Pap smear 1980's  . Hypertension   . Osteopenia   . Ulcer   . Urinary incontinence     Past Surgical History:  Procedure Laterality Date  . ABDOMINAL HYSTERECTOMY  09/2008   TVH/BSO--Dr. Quincy Simmonds with TVT  . BLADDER SUSPENSION  09/2008   Dr. Quincy Simmonds  . CERVIX LESION DESTRUCTION  1980   hx abnormal pap--dysplasia  . COLONOSCOPY WITH PROPOFOL Left 10/25/2017   Procedure: COLONOSCOPY WITH PROPOFOL;  Surgeon: Ronnette Juniper, MD;  Location: [facility];  Service: Gastroenterology;  Laterality: Left;    There were no vitals filed for this visit.  Subjective Assessment - 11/24/19 1604    Subjective  "Tired" "I am feeling ok, I am feeling better, you guys are helping me"    Currently in Pain?  Yes    Pain Score  4     Pain Location  Arm    Pain Orientation  Right                       [facility] Adult PT Treatment/Exercise - 11/24/19 0001      Neck Exercises: Machines for Strengthening   UBE (Upper Arm Bike)  L3 2 min each way     Nustep  L4 x 5 min     Cybex Row  20# 2x10    Lat Pull  20# 2x10    Other Machines for Strengthening  Chest press 5lb 2x10       Neck Exercises: Supine   Neck Retraction  3 secs;10 reps    Neck Retraction Limitations  then did partial rotation with the retraction      Moist Heat Therapy   Number Minutes Moist Heat  15 Minutes    Moist Heat Location  Cervical      Electrical Stimulation   Electrical Stimulation Location  Cervical spine    Electrical Stimulation Action  IFC    Electrical Stimulation Parameters  supine    Electrical Stimulation Goals  Pain      Manual Therapy   Manual Therapy  Manual Traction;Neural Stretch    Manual Traction  occipital release, some manual stretch of the cervical spine with shoulder depression, some upper trap and levator stretch             PT Education - 11/24/19 1621    Education Details  At home [facility] and TENs application    Person(s) Educated  Patient    Methods  Explanation;Demonstration;Tactile cues;Verbal cues    Comprehension  Verbalized understanding;Returned demonstration          PT Long Term Goals - 11/15/19 1738      PT LONG TERM GOAL #3   Title  improve bilat cervical motion =/> 75 degrees with  minimal to no pain ( 12/07/2019)    Status  Partially Met            Plan - 11/24/19 1658  Clinical Impression Statement  Pt reports improvement overall. Pt brought in at home [facility] unit and was educated on it use. She report some R shoulder pain, when assessed she had some R shoulder internal rotation and end range flexion. Postural cues needed with seated rows and lats.    Personal Factors and Comorbidities  Past/Current Experience;Comorbidity 3+    Comorbidities  HTN, osteopenia, OA, back pain allergies and anemia    Examination-Activity Limitations  Carry;Other    Examination-Participation Restrictions  Other    Stability/Clinical Decision Making  Stable/Uncomplicated    Rehab Potential  Good    PT Frequency  2x / week    PT Duration  6 weeks    PT Treatment/Interventions  Iontophoresis 34m/ml Dexamethasone;Taping;Patient/family education;Moist Heat;Traction;Passive range of motion;Therapeutic activities;Ultrasound;Cryotherapy;Electrical Stimulation;Manual techniques;Therapeutic exercise    PT Next Visit Plan  Pt goes to MD Wednesday 20th       Patient will benefit from skilled therapeutic intervention in  order to improve the following deficits and impairments:  Decreased range of motion, Impaired UE functional use, Increased muscle spasms, Pain, Decreased strength, Postural dysfunction  Visit Diagnosis: Muscle weakness (generalized)  Stiffness of right shoulder, not elsewhere classified  Cramp and spasm  Acute pain of right shoulder  Cervicalgia     Problem List Patient Active Problem List   Diagnosis Date Noted  . Closed nondisplaced fracture of proximal phalanx of lesser toe of left foot 11/19/2017  . GI bleed 10/23/2017  . Hypokalemia 10/23/2017  . Nausea vomiting and diarrhea 10/23/2017  . Hematochezia 10/23/2017  . 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 Back pain 04/12/2014  . Arthritis 04/12/2014  . Knee pain 04/12/2014  . Essential hypertension 11/27/2013  . Osteoarthritis of left knee 07/16/2012    RScot Jun PTA 11/24/2019, 5:05 PM  [place] [address]Phone: [phone]  Fax:  [phone] Name: Margaret BlickenstaffMRN: [id number] of Birth: [birth year]

## 2019-11-30 ENCOUNTER — Ambulatory Visit: Payer: Federal, State, Local not specified - PPO | Admitting: Physical Therapy

## 2019-12-01 ENCOUNTER — Ambulatory Visit: Payer: Federal, State, Local not specified - PPO | Admitting: Orthopedic Surgery

## 2019-12-01 ENCOUNTER — Other Ambulatory Visit: Payer: Self-pay

## 2019-12-01 ENCOUNTER — Encounter: Payer: Self-pay | Admitting: Orthopedic Surgery

## 2019-12-01 VITALS — BP 108/72 | HR 87 | Temp 96.8°F | Ht 66.0 in | Wt 165.0 lb

## 2019-12-01 DIAGNOSIS — R0789 Other chest pain: Secondary | ICD-10-CM | POA: Diagnosis not present

## 2019-12-01 DIAGNOSIS — G8929 Other chronic pain: Secondary | ICD-10-CM | POA: Diagnosis not present

## 2019-12-01 DIAGNOSIS — M542 Cervicalgia: Secondary | ICD-10-CM

## 2019-12-01 NOTE — Patient Instructions (Signed)
Continue therapy and meloxicam

## 2019-12-01 NOTE — Progress Notes (Signed)
Follow-up visit for [name] she had a costochondritis she has been in therapy she has been on Mobic she says she finally started to feel better she still has some perichondrial chest pain and also some right arm pain she relates to when IV placed in 2018 but it sounds more to me like symptoms caused by her neck issues  She has good cuff strength no evidence of adhesive capsulitis tenderness in the cervical spine decreased range of motion there tenderness and muscle spasm around the trapezius muscle especially on the right  Recommend continue meloxicam and physical therapy follow-up in 5 weeks

## 2019-12-07 ENCOUNTER — Encounter: Payer: Self-pay | Admitting: Physical Therapy

## 2019-12-07 ENCOUNTER — Ambulatory Visit: Payer: Federal, State, Local not specified - PPO | Admitting: Physical Therapy

## 2019-12-07 ENCOUNTER — Other Ambulatory Visit: Payer: Self-pay

## 2019-12-07 DIAGNOSIS — M25611 Stiffness of right shoulder, not elsewhere classified: Secondary | ICD-10-CM

## 2019-12-07 DIAGNOSIS — M6281 Muscle weakness (generalized): Secondary | ICD-10-CM

## 2019-12-07 DIAGNOSIS — M542 Cervicalgia: Secondary | ICD-10-CM

## 2019-12-07 DIAGNOSIS — R252 Cramp and spasm: Secondary | ICD-10-CM

## 2019-12-07 DIAGNOSIS — M25511 Pain in right shoulder: Secondary | ICD-10-CM

## 2019-12-07 NOTE — Therapy (Signed)
[place], [place], [address] Phone: [phone]   Fax:  [phone]  Physical Therapy Treatment  Patient Details  Name: Margaret [name] MRN: [id number] Date of Birth: Jan 27, 1951 Referring Provider (PT): Dr Arther Abbott   Encounter Date: 12/07/2019  PT End of Session - 12/07/19 1654    Visit Number  8    Date for PT Re-Evaluation  01/08/20    Authorization Type  BCBS    Authorization - Number of Visits  85    PT Start Time  9563    PT Stop Time  1708    PT Time Calculation (min)  57 min    Activity Tolerance  Patient tolerated treatment well    Behavior During Therapy  [facility] for tasks assessed/performed       Past Medical History:  Diagnosis Date  . Allergy   . Anemia   . Colitis   . Dyspareunia   . Endometriosis   . GERD (gastroesophageal reflux disease)   . High cholesterol   . Hx of abnormal Pap smear 1980's  . Hypertension   . Osteopenia   . Ulcer   . Urinary incontinence     Past Surgical History:  Procedure Laterality Date  . ABDOMINAL HYSTERECTOMY  09/2008   TVH/BSO--Dr. Quincy Simmonds with TVT  . BLADDER SUSPENSION  09/2008   Dr. Quincy Simmonds  . CERVIX LESION DESTRUCTION  1980   hx abnormal pap--dysplasia  . COLONOSCOPY WITH PROPOFOL Left 10/25/2017   Procedure: COLONOSCOPY WITH PROPOFOL;  Surgeon: Ronnette Juniper, MD;  Location: [facility];  Service: Gastroenterology;  Laterality: Left;    There were no vitals filed for this visit.  Subjective Assessment - 12/07/19 1617    Subjective  Patient saw the MD, he feels like she is progressing well, still some chest wall issues, neck pain and right shoulder and arm issues, MD felt like she needed to continue with PT    Currently in Pain?  Yes    Pain Score  5     Pain Location  Arm    Pain Orientation  Right    Pain Descriptors / Indicators  Tightness    Aggravating Factors   moving the arm, just feels tight    Pain  Relieving Factors  treatment here has helped                       [facility] Adult PT Treatment/Exercise - 12/07/19 0001      Neck Exercises: Machines for Strengthening   UBE (Upper Arm Bike)  L3 2 min each way     Cybex Row  20# 2x10    Lat Pull  20# 2x10      Neck Exercises: Theraband   Shoulder Extension  20 reps;Red    Rows  20 reps;Red    Shoulder External Rotation  20 reps;Red      Moist Heat Therapy   Number Minutes Moist Heat  12 Minutes    Moist Heat Location  Cervical;Shoulder      Electrical Stimulation   Electrical Stimulation Location  right upper trap/neck and into the right upper arm    Electrical Stimulation Action  IFC    Electrical Stimulation Parameters  supine    Electrical Stimulation Goals  Pain      Manual Therapy   Manual Therapy  Neural Stretch;Passive ROM    Passive ROM  of the irght shoulder to end ranges, some  star gazer stretch with passive stretch    Manual Traction  occipital release, some manual stretch of the cervical spine with shoulder depression, some upper trap and levator stretch    Neural Stretch  Ulnar & Radial nerve glide LUE                  PT Long Term Goals - 12/07/19 1712      PT LONG TERM GOAL #1   Title  I with advanced HEP to include taking posture breaks during work ( 12/07/2019)    Status  Achieved      PT LONG TERM GOAL #2   Title  improve upper back strength =/>5-/5 to assist with supporting upright posture ( 12/07/2019)    Status  On-going      PT LONG TERM GOAL #3   Title  improve bilat cervical motion =/> 75 degrees with  minimal to no pain ( 12/07/2019)    Status  Partially Met      PT LONG TERM GOAL #4   Title  report =/> 75% reduction in overall pain of neck and chest with daily activity ( 12/07/2019)    Status  Partially Met      PT LONG TERM GOAL #5   Title  Pt will be able to walk for fitness 20 min at a time without increasing pain.     Status  Achieved            Plan -  12/07/19 1655    Clinical Impression Statement  Patient returns from the MD, MD note reports that he feels like she is doing well, does not feel like there is a frozen shoulder, she has a lot of c/o pain and tightness in the right arm from the wrist to the neck and chest weall area, she did have significant tightness in teh medial nerve with the glides.    PT Next Visit Plan  will continue to work on her shoulder ROM, work on the neural tension    Consulted and Agree with Plan of Care  Patient       Patient will benefit from skilled therapeutic intervention in order to improve the following deficits and impairments:  Decreased range of motion, Impaired UE functional use, Increased muscle spasms, Pain, Decreased strength, Postural dysfunction  Visit Diagnosis: Muscle weakness (generalized) - Plan: PT plan of care cert/re-cert  Stiffness of right shoulder, not elsewhere classified - Plan: PT plan of care cert/re-cert  Cramp and spasm - Plan: PT plan of care cert/re-cert  Acute pain of right shoulder - Plan: PT plan of care cert/re-cert  Cervicalgia - Plan: PT plan of care cert/re-cert     Problem List Patient Active Problem List   Diagnosis Date Noted  . Closed nondisplaced fracture of proximal phalanx of lesser toe of left foot 11/19/2017  . GI bleed 10/23/2017  . Hypokalemia 10/23/2017  . Nausea vomiting and diarrhea 10/23/2017  . Hematochezia 10/23/2017  . 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 Back pain 04/12/2014  . Arthritis 04/12/2014  . Knee pain 04/12/2014  . Essential hypertension 11/27/2013  . Osteoarthritis of left knee 07/16/2012    Sumner Boast., PT 12/07/2019, 5:17 PM  [address] [place], [place], [address] Phone: [phone]   Fax:  [phone]  Name: Margaret [name] MRN: [id number] Date of Birth: [birth year]

## 2019-12-10 ENCOUNTER — Encounter: Payer: Federal, State, Local not specified - PPO | Admitting: Physical Therapy

## 2019-12-13 ENCOUNTER — Encounter: Payer: Self-pay | Admitting: Physical Therapy

## 2019-12-13 ENCOUNTER — Ambulatory Visit: Payer: Federal, State, Local not specified - PPO | Attending: Orthopedic Surgery | Admitting: Physical Therapy

## 2019-12-13 ENCOUNTER — Other Ambulatory Visit: Payer: Self-pay

## 2019-12-13 DIAGNOSIS — R252 Cramp and spasm: Secondary | ICD-10-CM | POA: Diagnosis present

## 2019-12-13 DIAGNOSIS — M6281 Muscle weakness (generalized): Secondary | ICD-10-CM | POA: Diagnosis not present

## 2019-12-13 DIAGNOSIS — M25611 Stiffness of right shoulder, not elsewhere classified: Secondary | ICD-10-CM | POA: Diagnosis present

## 2019-12-13 DIAGNOSIS — M542 Cervicalgia: Secondary | ICD-10-CM | POA: Diagnosis present

## 2019-12-13 DIAGNOSIS — M25511 Pain in right shoulder: Secondary | ICD-10-CM | POA: Diagnosis present

## 2019-12-13 NOTE — Therapy (Signed)
[place], [place], [address] Phone: [phone]   Fax:  [phone]  Physical Therapy Treatment  Patient Details  Name: Margaret [name] MRN: [id number] Date of Birth: February 20, 1951 Referring Provider (PT): Dr Arther Abbott   Encounter Date: 12/13/2019  PT End of Session - 12/13/19 1647    Visit Number  9    Date for PT Re-Evaluation  01/08/20    Authorization Type  BCBS    PT Start Time  1600    PT Stop Time  1659    PT Time Calculation (min)  59 min    Activity Tolerance  Patient tolerated treatment well    Behavior During Therapy  [facility] for tasks assessed/performed       Past Medical History:  Diagnosis Date  . Allergy   . Anemia   . Colitis   . Dyspareunia   . Endometriosis   . GERD (gastroesophageal reflux disease)   . High cholesterol   . Hx of abnormal Pap smear 1980's  . Hypertension   . Osteopenia   . Ulcer   . Urinary incontinence     Past Surgical History:  Procedure Laterality Date  . ABDOMINAL HYSTERECTOMY  09/2008   TVH/BSO--Dr. Quincy Simmonds with TVT  . BLADDER SUSPENSION  09/2008   Dr. Quincy Simmonds  . CERVIX LESION DESTRUCTION  1980   hx abnormal pap--dysplasia  . COLONOSCOPY WITH PROPOFOL Left 10/25/2017   Procedure: COLONOSCOPY WITH PROPOFOL;  Surgeon: Ronnette Juniper, MD;  Location: [facility];  Service: Gastroenterology;  Laterality: Left;    There were no vitals filed for this visit.  Subjective Assessment - 12/13/19 1602    Subjective  Pt reports that she got a shot in her arm yesterday and reports pain in the injection site/    Currently in Pain?  Yes    Pain Score  3     Pain Location  Arm    Pain Orientation  Right;Left;Upper                       [facility] Adult PT Treatment/Exercise - 12/13/19 0001      Neck Exercises: Machines for Strengthening   UBE (Upper Arm Bike)  L3 2 min each way     Nustep  L4 x 4 min     Cybex Row  20#  2x10    Lat Pull  20# 2x10      Neck Exercises: Theraband   Shoulder Extension  20 reps;Red    Rows  20 reps;Red    Shoulder External Rotation  20 reps;Red      Moist Heat Therapy   Number Minutes Moist Heat  15 Minutes    Moist Heat Location  Cervical;Shoulder      Electrical Stimulation   Electrical Stimulation Location  right upper trap/neck and into the right upper arm    Electrical Stimulation Action  IFC    Electrical Stimulation Parameters  supine    Electrical Stimulation Goals  Pain      Manual Therapy   Passive ROM  Cercical spine with some end range stretching    Manual Traction  occipital release, some manual stretch of the cervical spine with shoulder depression, some upper trap and levator stretch                  PT Long Term Goals - 12/07/19 1712      PT LONG TERM GOAL #1  Title  I with advanced HEP to include taking posture breaks during work ( 12/07/2019)    Status  Achieved      PT LONG TERM GOAL #2   Title  improve upper back strength =/>5-/5 to assist with supporting upright posture ( 12/07/2019)    Status  On-going      PT LONG TERM GOAL #3   Title  improve bilat cervical motion =/> 75 degrees with  minimal to no pain ( 12/07/2019)    Status  Partially Met      PT LONG TERM GOAL #4   Title  report =/> 75% reduction in overall pain of neck and chest with daily activity ( 12/07/2019)    Status  Partially Met      PT LONG TERM GOAL #5   Title  Pt will be able to walk for fitness 20 min at a time without increasing pain.     Status  Achieved            Plan - 12/13/19 1648    Clinical Impression Statement  Pt reports that today was one of her good days. She did well maintain good posture with all exercises interventions. No reports of pain. She did report  some neck tightness that's improved with MT.    Personal Factors and Comorbidities  Past/Current Experience;Comorbidity 3+    Comorbidities  HTN, osteopenia, OA, back pain allergies  and anemia    Examination-Activity Limitations  Carry;Other    Examination-Participation Restrictions  Other    Rehab Potential  Good    PT Frequency  2x / week    PT Duration  6 weeks    PT Treatment/Interventions  Iontophoresis 22m/ml Dexamethasone;Taping;Patient/family education;Moist Heat;Traction;Passive range of motion;Therapeutic activities;Ultrasound;Cryotherapy;Electrical Stimulation;Manual techniques;Therapeutic exercise    PT Next Visit Plan  will continue to work on her shoulder ROM, work on the neural tension       Patient will benefit from skilled therapeutic intervention in order to improve the following deficits and impairments:     Visit Diagnosis: Muscle weakness (generalized)  Stiffness of right shoulder, not elsewhere classified  Cramp and spasm  Acute pain of right shoulder  Cervicalgia     Problem List Patient Active Problem List   Diagnosis Date Noted  . Closed nondisplaced fracture of proximal phalanx of lesser toe of left foot 11/19/2017  . GI bleed 10/23/2017  . Hypokalemia 10/23/2017  . Nausea vomiting and diarrhea 10/23/2017  . Hematochezia 10/23/2017  . 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 Back pain 04/12/2014  . Arthritis 04/12/2014  . Knee pain 04/12/2014  . Essential hypertension 11/27/2013  . Osteoarthritis of left knee 07/16/2012    RScot Jun PTA 12/13/2019, 4:50 PM  [place] [place] [address]Phone: [phone]  Fax:  [phone] Name: CMelinda GwinnerMRN: [id number] of Birth: 706/03/1951

## 2019-12-15 ENCOUNTER — Encounter: Payer: Self-pay | Admitting: Physical Therapy

## 2019-12-15 ENCOUNTER — Ambulatory Visit: Payer: Federal, State, Local not specified - PPO | Admitting: Physical Therapy

## 2019-12-15 ENCOUNTER — Other Ambulatory Visit: Payer: Self-pay

## 2019-12-15 DIAGNOSIS — M6281 Muscle weakness (generalized): Secondary | ICD-10-CM | POA: Diagnosis not present

## 2019-12-15 DIAGNOSIS — M25611 Stiffness of right shoulder, not elsewhere classified: Secondary | ICD-10-CM

## 2019-12-15 DIAGNOSIS — R252 Cramp and spasm: Secondary | ICD-10-CM

## 2019-12-15 NOTE — Therapy (Signed)
[place], [place], [address] Phone: [phone]   Fax:  [phone]  Physical Therapy Treatment  Patient Details  Name: Margaret [name] MRN: [id number] Date of Birth: 03-Apr-1951 Referring Provider (PT): Dr Arther Abbott   Encounter Date: 12/15/2019  PT End of Session - 12/15/19 1647    Visit Number  10    Authorization Type  BCBS    PT Start Time  1603    PT Stop Time  1700    PT Time Calculation (min)  57 min    Activity Tolerance  Patient tolerated treatment well    Behavior During Therapy  [facility] for tasks assessed/performed       Past Medical History:  Diagnosis Date  . Allergy   . Anemia   . Colitis   . Dyspareunia   . Endometriosis   . GERD (gastroesophageal reflux disease)   . High cholesterol   . Hx of abnormal Pap smear 1980's  . Hypertension   . Osteopenia   . Ulcer   . Urinary incontinence     Past Surgical History:  Procedure Laterality Date  . ABDOMINAL HYSTERECTOMY  09/2008   TVH/BSO--Dr. Quincy Simmonds with TVT  . BLADDER SUSPENSION  09/2008   Dr. Quincy Simmonds  . CERVIX LESION DESTRUCTION  1980   hx abnormal pap--dysplasia  . COLONOSCOPY WITH PROPOFOL Left 10/25/2017   Procedure: COLONOSCOPY WITH PROPOFOL;  Surgeon: Ronnette Juniper, MD;  Location: [facility];  Service: Gastroenterology;  Laterality: Left;    There were no vitals filed for this visit.  Subjective Assessment - 12/15/19 1609    Subjective  "I am feeling ok, just a little chest pain."    Currently in Pain?  Yes    Pain Score  4     Pain Location  Chest         [facility] PT Assessment - 12/15/19 0001      AROM   Overall AROM   Within functional limits for tasks performed    AROM Assessment Site  Cervical                   [facility] Adult PT Treatment/Exercise - 12/15/19 0001      Neck Exercises: Machines for Strengthening   UBE (Upper Arm Bike)  L3 2 min each way     Nustep  L4 x 5  min     Cybex Row  25# 2x10    Lat Pull  25# 2x10      Neck Exercises: Theraband   Shoulder External Rotation  20 reps;Red      Neck Exercises: Standing   Neck Retraction  20 reps;3 secs      Moist Heat Therapy   Number Minutes Moist Heat  15 Minutes    Moist Heat Location  Cervical;Shoulder      Electrical Stimulation   Electrical Stimulation Location  right upper trap/neck and into the right upper arm    Electrical Stimulation Action  IFC    Electrical Stimulation Parameters  supine    Electrical Stimulation Goals  Pain      Manual Therapy   Passive ROM  Cercical spine with some end range stretching    Manual Traction  occipital release, some manual stretch of the cervical spine with shoulder depression, some upper trap and levator stretch                  PT Long Term Goals -  12/15/19 1621      PT LONG TERM GOAL #2   Title  improve upper back strength =/>5-/5 to assist with supporting upright posture ( 12/07/2019)    Status  On-going      PT LONG TERM GOAL #3   Title  improve bilat cervical motion =/> 75 degrees with  minimal to no pain ( 12/07/2019)    Status  Achieved      PT LONG TERM GOAL #4   Title  report =/> 75% reduction in overall pain of neck and chest with daily activity ( 12/07/2019)    Status  Partially Met            Plan - 12/15/19 1648    Clinical Impression Statement  Pt has progressed increasing her cervical ROM. Increase resistance tolerated with seated rows and lats. No reports of increase pain but cues needed with retract scapulas with seated rows. Some stiffness noted with MT.    Personal Factors and Comorbidities  Past/Current Experience;Comorbidity 3+    Comorbidities  HTN, osteopenia, OA, back pain allergies and anemia    Examination-Activity Limitations  Carry;Other    Examination-Participation Restrictions  Other    Stability/Clinical Decision Making  Stable/Uncomplicated    Rehab Potential  Good    PT Frequency  2x / week     PT Duration  6 weeks    PT Treatment/Interventions  Iontophoresis 61m/ml Dexamethasone;Taping;Patient/family education;Moist Heat;Traction;Passive range of motion;Therapeutic activities;Ultrasound;Cryotherapy;Electrical Stimulation;Manual techniques;Therapeutic exercise    PT Next Visit Plan  will continue to work on her shoulder ROM, work on the neural tension       Patient will benefit from skilled therapeutic intervention in order to improve the following deficits and impairments:  Decreased range of motion, Impaired UE functional use, Increased muscle spasms, Pain, Decreased strength, Postural dysfunction  Visit Diagnosis: Muscle weakness (generalized)  Cramp and spasm  Stiffness of right shoulder, not elsewhere classified     Problem List Patient Active Problem List   Diagnosis Date Noted  . Closed nondisplaced fracture of proximal phalanx of lesser toe of left foot 11/19/2017  . GI bleed 10/23/2017  . Hypokalemia 10/23/2017  . Nausea vomiting and diarrhea 10/23/2017  . Hematochezia 10/23/2017  . 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 Back pain 04/12/2014  . Arthritis 04/12/2014  . Knee pain 04/12/2014  . Essential hypertension 11/27/2013  . Osteoarthritis of left knee 07/16/2012    RScot Jun PTA 12/15/2019, 4:52 PM  [email or web address] [address]Phone: [phone]  Fax:  [phone] Name: Margaret KlinkeMRN: [id number] of Birth: [birth year]

## 2019-12-20 ENCOUNTER — Encounter: Payer: Federal, State, Local not specified - PPO | Admitting: Physical Therapy

## 2019-12-22 ENCOUNTER — Encounter: Payer: Self-pay | Admitting: Physical Therapy

## 2019-12-22 ENCOUNTER — Ambulatory Visit: Payer: Federal, State, Local not specified - PPO | Admitting: Physical Therapy

## 2019-12-22 ENCOUNTER — Other Ambulatory Visit: Payer: Self-pay

## 2019-12-22 DIAGNOSIS — M542 Cervicalgia: Secondary | ICD-10-CM

## 2019-12-22 DIAGNOSIS — M6281 Muscle weakness (generalized): Secondary | ICD-10-CM

## 2019-12-22 DIAGNOSIS — M25611 Stiffness of right shoulder, not elsewhere classified: Secondary | ICD-10-CM

## 2019-12-22 DIAGNOSIS — R252 Cramp and spasm: Secondary | ICD-10-CM

## 2019-12-22 DIAGNOSIS — M25511 Pain in right shoulder: Secondary | ICD-10-CM

## 2019-12-22 NOTE — Therapy (Signed)
[place], [place], [address] Phone: [phone]   Fax:  [phone]  Physical Therapy Treatment  Patient Details  Name: Margaret [name] MRN: [id number] Date of Birth: 08/23/1951 Referring Provider (PT): Dr Arther Abbott   Encounter Date: 12/22/2019  PT End of Session - 12/22/19 1740    Visit Number  11    Number of Visits  50    Date for PT Re-Evaluation  01/08/20    Authorization Type  BCBS    Authorization - Number of Visits  50    PT Start Time  1656    PT Stop Time  1745    PT Time Calculation (min)  49 min    Activity Tolerance  Patient tolerated treatment well    Behavior During Therapy  [facility] for tasks assessed/performed       Past Medical History:  Diagnosis Date  . Allergy   . Anemia   . Colitis   . Dyspareunia   . Endometriosis   . GERD (gastroesophageal reflux disease)   . High cholesterol   . Hx of abnormal Pap smear 1980's  . Hypertension   . Osteopenia   . Ulcer   . Urinary incontinence     Past Surgical History:  Procedure Laterality Date  . ABDOMINAL HYSTERECTOMY  09/2008   TVH/BSO--Dr. Quincy Simmonds with TVT  . BLADDER SUSPENSION  09/2008   Dr. Quincy Simmonds  . CERVIX LESION DESTRUCTION  1980   hx abnormal pap--dysplasia  . COLONOSCOPY WITH PROPOFOL Left 10/25/2017   Procedure: COLONOSCOPY WITH PROPOFOL;  Surgeon: Ronnette Juniper, MD;  Location: [facility];  Service: Gastroenterology;  Laterality: Left;    There were no vitals filed for this visit.  Subjective Assessment - 12/22/19 1739    Subjective  Patient reports that she is having some increae neck, shoulder and right arm pain.    Currently in Pain?  Yes    Pain Score  6     Pain Location  Shoulder    Pain Orientation  Right    Aggravating Factors   work and stress                       [facility] Adult PT Treatment/Exercise - 12/22/19 0001      Moist Heat Therapy   Number Minutes  Moist Heat  12 Minutes    Moist Heat Location  Cervical;Shoulder      Electrical Stimulation   Electrical Stimulation Location  right upper trap/neck and into the right upper arm    Electrical Stimulation Action  IFC    Electrical Stimulation Parameters  supine    Electrical Stimulation Goals  Pain      Manual Therapy   Manual Therapy  Soft tissue mobilization;Passive ROM;Neural Stretch;Muscle Energy Technique    Soft tissue mobilization  to the upper traps and into the neck, some MFR in the right pectoral area    Passive ROM  right shoulder all motions to end ranges    Muscle Energy Technique  some contract relax for the shoulder motions    Neural Stretch  Ulnar, medial & Radial nerve glide LUE                  PT Long Term Goals - 12/22/19 1743      PT LONG TERM GOAL #2   Title  improve upper back strength =/>5-/5 to assist with supporting upright posture ( 12/07/2019)  Status  Partially Met      PT LONG TERM GOAL #3   Title  improve bilat cervical motion =/> 75 degrees with  minimal to no pain ( 12/07/2019)    Status  Achieved      PT LONG TERM GOAL #4   Title  report =/> 75% reduction in overall pain of neck and chest with daily activity ( 12/07/2019)    Status  Partially Met            Plan - 12/22/19 1741    Clinical Impression Statement  Patient talks about increase upper trap pain, I looked at the area and she has significant indentations at the end of the trapezius due to her bra strap.  I worked on the traps, she has knots here, tried some MFR inthe right pectoral area.  She seems very worried aobut the diagnosis and asks about arthiritis, I spoke with her that wha tI was seeing was mostly muscular in nature and tightness in the right shoulder and arm possible due to posture and her job, the indentations of the bra could have some affect    PT Next Visit Plan  will continue to work on her shoulder ROM, work on the neural tension    Consulted and Agree  with Plan of Care  Patient       Patient will benefit from skilled therapeutic intervention in order to improve the following deficits and impairments:  Decreased range of motion, Impaired UE functional use, Increased muscle spasms, Pain, Decreased strength, Postural dysfunction  Visit Diagnosis: Muscle weakness (generalized)  Cramp and spasm  Stiffness of right shoulder, not elsewhere classified  Acute pain of right shoulder  Cervicalgia     Problem List Patient Active Problem List   Diagnosis Date Noted  . Closed nondisplaced fracture of proximal phalanx of lesser toe of left foot 11/19/2017  . GI bleed 10/23/2017  . Hypokalemia 10/23/2017  . Nausea vomiting and diarrhea 10/23/2017  . Hematochezia 10/23/2017  . 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 Back pain 04/12/2014  . Arthritis 04/12/2014  . Knee pain 04/12/2014  . Essential hypertension 11/27/2013  . Osteoarthritis of left knee 07/16/2012    Sumner Boast., PT 12/22/2019, 5:44 PM  [place] [address] [place], [place], [address] Phone: [phone]   Fax:  [phone]  Name: Lamesha Tibbits MRN: [id number] Date of Birth: [birth year]

## 2019-12-24 ENCOUNTER — Ambulatory Visit: Payer: Federal, State, Local not specified - PPO | Admitting: Physical Therapy

## 2019-12-24 ENCOUNTER — Encounter: Payer: Self-pay | Admitting: Physical Therapy

## 2019-12-24 ENCOUNTER — Other Ambulatory Visit: Payer: Self-pay

## 2019-12-24 DIAGNOSIS — M25611 Stiffness of right shoulder, not elsewhere classified: Secondary | ICD-10-CM

## 2019-12-24 DIAGNOSIS — M6281 Muscle weakness (generalized): Secondary | ICD-10-CM

## 2019-12-24 DIAGNOSIS — R252 Cramp and spasm: Secondary | ICD-10-CM

## 2019-12-24 DIAGNOSIS — M25511 Pain in right shoulder: Secondary | ICD-10-CM

## 2019-12-24 DIAGNOSIS — M542 Cervicalgia: Secondary | ICD-10-CM

## 2019-12-24 NOTE — Therapy (Signed)
[place] [facility], [place], [address] Phone: [phone]   Fax:  [phone]  Physical Therapy Treatment  Patient Details  Name: Margaret [name] MRN: [id number] Date of Birth: July 10, 1951 Referring Provider (PT): Dr Arther Abbott   Encounter Date: 12/24/2019  PT End of Session - 12/24/19 0943    Visit Number  12    Date for PT Re-Evaluation  01/08/20    Authorization Type  BCBS    PT Start Time  0753    PT Stop Time  0851    PT Time Calculation (min)  58 min    Activity Tolerance  Patient tolerated treatment well    Behavior During Therapy  [facility] for tasks assessed/performed       Past Medical History:  Diagnosis Date  . Allergy   . Anemia   . Colitis   . Dyspareunia   . Endometriosis   . GERD (gastroesophageal reflux disease)   . High cholesterol   . Hx of abnormal Pap smear 1980's  . Hypertension   . Osteopenia   . Ulcer   . Urinary incontinence     Past Surgical History:  Procedure Laterality Date  . ABDOMINAL HYSTERECTOMY  09/2008   TVH/BSO--Dr. Quincy Simmonds with TVT  . BLADDER SUSPENSION  09/2008   Dr. Quincy Simmonds  . CERVIX LESION DESTRUCTION  1980   hx abnormal pap--dysplasia  . COLONOSCOPY WITH PROPOFOL Left 10/25/2017   Procedure: COLONOSCOPY WITH PROPOFOL;  Surgeon: Ronnette Juniper, MD;  Location: [facility];  Service: Gastroenterology;  Laterality: Left;    There were no vitals filed for this visit.  Subjective Assessment - 12/24/19 0755    Subjective  Patient reports that the last treatment felt really good, still sore    Currently in Pain?  Yes    Pain Score  4     Pain Location  Shoulder    Pain Orientation  Right    Pain Descriptors / Indicators  Sore;Spasm                       [facility] Adult PT Treatment/Exercise - 12/24/19 0001      Neck Exercises: Machines for Strengthening   UBE (Upper Arm Bike)  Level 3 x 5 minutes change direction every  minute    Cybex Row  25# 2x10    Lat Pull  25# 2x10      Neck Exercises: Standing   Other Standing Exercises  5# shrugs, upper trap and levator stretches    Other Standing Exercises  doorway stretch      Neck Exercises: Seated   W Back  20 reps    W Back Limitations  standing with PT overpressure    Other Seated Exercise  bent over row, extension and horizontal abduction      Moist Heat Therapy   Number Minutes Moist Heat  12 Minutes    Moist Heat Location  Cervical;Shoulder      Electrical Stimulation   Electrical Stimulation Location  right upper trap/neck and into the right upper arm    Electrical Stimulation Action  IFC    Electrical Stimulation Parameters  supine    Electrical Stimulation Goals  Pain      Manual Therapy   Manual Therapy  Soft tissue mobilization;Passive ROM;Neural Stretch;Muscle Energy Technique    Soft tissue mobilization  to the upper traps and into the neck, some MFR in the right pectoral area  Passive ROM  right shoulder all motions to end ranges    Muscle Energy Technique  some contract relax for the shoulder motions    Neural Stretch  Ulnar, medial & Radial nerve glide LUE                  PT Long Term Goals - 12/22/19 1743      PT LONG TERM GOAL #2   Title  improve upper back strength =/>5-/5 to assist with supporting upright posture ( 12/07/2019)    Status  Partially Met      PT LONG TERM GOAL #3   Title  improve bilat cervical motion =/> 75 degrees with  minimal to no pain ( 12/07/2019)    Status  Achieved      PT LONG TERM GOAL #4   Title  report =/> 75% reduction in overall pain of neck and chest with daily activity ( 12/07/2019)    Status  Partially Met            Plan - 12/24/19 0944    Clinical Impression Statement  Patient reported that she felt better after the last treatment, she did have increaesd ROM, still painful and has a lot of spasms in the upper trap and the pecs are tight    PT Next Visit Plan  will  continue to work on her shoulder ROM, work on the neural tension    Consulted and Agree with Plan of Care  Patient       Patient will benefit from skilled therapeutic intervention in order to improve the following deficits and impairments:  Decreased range of motion, Impaired UE functional use, Increased muscle spasms, Pain, Decreased strength, Postural dysfunction  Visit Diagnosis: Muscle weakness (generalized)  Cramp and spasm  Stiffness of right shoulder, not elsewhere classified  Acute pain of right shoulder  Cervicalgia     Problem List Patient Active Problem List   Diagnosis Date Noted  . Closed nondisplaced fracture of proximal phalanx of lesser toe of left foot 11/19/2017  . GI bleed 10/23/2017  . Hypokalemia 10/23/2017  . Nausea vomiting and diarrhea 10/23/2017  . Hematochezia 10/23/2017  . 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 Back pain 04/12/2014  . Arthritis 04/12/2014  . Knee pain 04/12/2014  . Essential hypertension 11/27/2013  . Osteoarthritis of left knee 07/16/2012    Sumner Boast., PT  12/24/2019, 9:47 AM  [place] [address] [place], [place], [address] Phone: [phone]   Fax:  [phone]  Name: Margaret [name] MRN: [id number] Date of Birth: 10-15-1951

## 2019-12-27 ENCOUNTER — Ambulatory Visit: Payer: Federal, State, Local not specified - PPO | Admitting: Physical Therapy

## 2019-12-27 ENCOUNTER — Encounter: Payer: Self-pay | Admitting: Physical Therapy

## 2019-12-27 ENCOUNTER — Other Ambulatory Visit: Payer: Self-pay

## 2019-12-27 DIAGNOSIS — M25511 Pain in right shoulder: Secondary | ICD-10-CM

## 2019-12-27 DIAGNOSIS — M542 Cervicalgia: Secondary | ICD-10-CM

## 2019-12-27 DIAGNOSIS — M6281 Muscle weakness (generalized): Secondary | ICD-10-CM

## 2019-12-27 DIAGNOSIS — R252 Cramp and spasm: Secondary | ICD-10-CM

## 2019-12-27 DIAGNOSIS — M25611 Stiffness of right shoulder, not elsewhere classified: Secondary | ICD-10-CM

## 2019-12-27 NOTE — Therapy (Signed)
[place], [place], [address] Phone: [phone]   Fax:  [phone]  Physical Therapy Treatment  Patient Details  Name: Gianny Killman MRN: [id number] Date of Birth: July 20, 1951 Referring Provider (PT): Dr Arther Abbott   Encounter Date: 12/27/2019  PT End of Session - 12/27/19 1707    Visit Number  13    Date for PT Re-Evaluation  01/08/20    Authorization Type  BCBS    PT Start Time  1613    PT Stop Time  1710    PT Time Calculation (min)  57 min    Activity Tolerance  Patient tolerated treatment well    Behavior During Therapy  [facility] for tasks assessed/performed       Past Medical History:  Diagnosis Date  . Allergy   . Anemia   . Colitis   . Dyspareunia   . Endometriosis   . GERD (gastroesophageal reflux disease)   . High cholesterol   . Hx of abnormal Pap smear 1980's  . Hypertension   . Osteopenia   . Ulcer   . Urinary incontinence     Past Surgical History:  Procedure Laterality Date  . ABDOMINAL HYSTERECTOMY  09/2008   TVH/BSO--Dr. Quincy Simmonds with TVT  . BLADDER SUSPENSION  09/2008   Dr. Quincy Simmonds  . CERVIX LESION DESTRUCTION  1980   hx abnormal pap--dysplasia  . COLONOSCOPY WITH PROPOFOL Left 10/25/2017   Procedure: COLONOSCOPY WITH PROPOFOL;  Surgeon: Ronnette Juniper, MD;  Location: [facility];  Service: Gastroenterology;  Laterality: Left;    There were no vitals filed for this visit.  Subjective Assessment - 12/27/19 1618    Subjective  Patietn reports that the weather has been ugly and she has not done much of anything    Currently in Pain?  Yes    Pain Location  Shoulder    Pain Orientation  Right                       [facility] Adult PT Treatment/Exercise - 12/27/19 0001      Neck Exercises: Machines for Strengthening   UBE (Upper Arm Bike)  Level 3 x 5 minutes change direction every minute    Cybex Row  25# 2x10    Lat Pull  25#  2x10      Neck Exercises: Theraband   Shoulder Extension  20 reps;Red    Shoulder External Rotation  20 reps;Red      Neck Exercises: Seated   W Back  20 reps    Other Seated Exercise  bent over row, extension and horizontal abduction      Neck Exercises: Supine   Neck Retraction  3 secs;10 reps    Neck Retraction Limitations  then did partial rotation with the retraction      Moist Heat Therapy   Number Minutes Moist Heat  12 Minutes    Moist Heat Location  Cervical;Shoulder      Electrical Stimulation   Electrical Stimulation Location  right upper trap/neck and into the right upper arm    Electrical Stimulation Action  IFC    Electrical Stimulation Parameters  supine    Electrical Stimulation Goals  Pain      Manual Therapy   Manual Therapy  Soft tissue mobilization;Passive ROM;Neural Stretch;Muscle Energy Technique    Soft tissue mobilization  to the upper traps and into the neck, some MFR in the right pectoral area  Passive ROM  right shoulder all motions to end ranges    Manual Traction  occipital release, some manual stretch of the cervical spine with shoulder depression, some upper trap and levator stretch    Muscle Energy Technique  some contract relax for the shoulder motions    Neural Stretch  Ulnar, medial & Radial nerve glide LUE                  PT Long Term Goals - 12/22/19 1743      PT LONG TERM GOAL #2   Title  improve upper back strength =/>5-/5 to assist with supporting upright posture ( 12/07/2019)    Status  Partially Met      PT LONG TERM GOAL #3   Title  improve bilat cervical motion =/> 75 degrees with  minimal to no pain ( 12/07/2019)    Status  Achieved      PT LONG TERM GOAL #4   Title  report =/> 75% reduction in overall pain of neck and chest with daily activity ( 12/07/2019)    Status  Partially Met            Plan - 12/27/19 1707    Clinical Impression Statement  Patient is moving better, has better ROM, she remains tight  and painful at end ranges and has neural tension in the right upper arm.  She needs cues to complete exercises and for proper form.    PT Next Visit Plan  will continue to work on her shoulder ROM, work on the neural tension    Consulted and Agree with Plan of Care  Patient       Patient will benefit from skilled therapeutic intervention in order to improve the following deficits and impairments:  Decreased range of motion, Impaired UE functional use, Increased muscle spasms, Pain, Decreased strength, Postural dysfunction  Visit Diagnosis: Muscle weakness (generalized)  Cramp and spasm  Stiffness of right shoulder, not elsewhere classified  Acute pain of right shoulder  Cervicalgia     Problem List Patient Active Problem List   Diagnosis Date Noted  . Closed nondisplaced fracture of proximal phalanx of lesser toe of left foot 11/19/2017  . GI bleed 10/23/2017  . Hypokalemia 10/23/2017  . Nausea vomiting and diarrhea 10/23/2017  . Hematochezia 10/23/2017  . 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 Back pain 04/12/2014  . Arthritis 04/12/2014  . Knee pain 04/12/2014  . Essential hypertension 11/27/2013  . Osteoarthritis of left knee 07/16/2012    Sumner Boast., PT 12/27/2019, 5:12 PM  [place] [address] [place], [place], [address] Phone: [phone]   Fax:  [phone]  Name: Emersynn Deatley MRN: [id number] Date of Birth: November 05, 1951

## 2019-12-29 ENCOUNTER — Other Ambulatory Visit: Payer: Self-pay

## 2019-12-29 ENCOUNTER — Ambulatory Visit: Payer: Federal, State, Local not specified - PPO | Admitting: Physical Therapy

## 2019-12-29 ENCOUNTER — Encounter: Payer: Self-pay | Admitting: Physical Therapy

## 2019-12-29 DIAGNOSIS — M6281 Muscle weakness (generalized): Secondary | ICD-10-CM

## 2019-12-29 DIAGNOSIS — R252 Cramp and spasm: Secondary | ICD-10-CM

## 2019-12-29 DIAGNOSIS — M25611 Stiffness of right shoulder, not elsewhere classified: Secondary | ICD-10-CM

## 2019-12-29 DIAGNOSIS — M542 Cervicalgia: Secondary | ICD-10-CM

## 2019-12-29 DIAGNOSIS — M25511 Pain in right shoulder: Secondary | ICD-10-CM
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
## 2020-01-05 ENCOUNTER — Ambulatory Visit: Payer: Federal, State, Local not specified - PPO | Admitting: Orthopedic Surgery

## 2020-01-11 ENCOUNTER — Other Ambulatory Visit: Payer: Self-pay

## 2020-01-11 ENCOUNTER — Encounter: Payer: Self-pay | Admitting: Physical Therapy

## 2020-01-11 ENCOUNTER — Ambulatory Visit: Payer: Federal, State, Local not specified - PPO | Attending: Orthopedic Surgery | Admitting: Physical Therapy

## 2020-01-11 DIAGNOSIS — R252 Cramp and spasm: Secondary | ICD-10-CM | POA: Diagnosis present

## 2020-01-11 DIAGNOSIS — M25611 Stiffness of right shoulder, not elsewhere classified: Secondary | ICD-10-CM

## 2020-01-11 DIAGNOSIS — M6281 Muscle weakness (generalized): Secondary | ICD-10-CM

## 2020-01-11 DIAGNOSIS — M25511 Pain in right shoulder: Secondary | ICD-10-CM | POA: Diagnosis present

## 2020-01-11 DIAGNOSIS — M542 Cervicalgia: Secondary | ICD-10-CM

## 2020-01-11 NOTE — Therapy (Signed)
[place], [place], [address] Phone: [phone]   Fax:  [phone]  Physical Therapy Treatment  Patient Details  Name: Margaret [name] MRN: [id number] Date of Birth: Oct 27, 1951 Referring Provider (PT): Dr Arther Abbott   Encounter Date: 01/11/2020  PT End of Session - 01/11/20 1739    Visit Number  15    Date for PT Re-Evaluation  02/11/20    Authorization Type  BCBS    PT Start Time  1655    PT Stop Time  1752    PT Time Calculation (min)  57 min    Activity Tolerance  Patient tolerated treatment well    Behavior During Therapy  [facility] for tasks assessed/performed       Past Medical History:  Diagnosis Date  . Allergy   . Anemia   . Colitis   . Dyspareunia   . Endometriosis   . GERD (gastroesophageal reflux disease)   . High cholesterol   . Hx of abnormal Pap smear 1980's  . Hypertension   . Osteopenia   . Ulcer   . Urinary incontinence     Past Surgical History:  Procedure Laterality Date  . ABDOMINAL HYSTERECTOMY  09/2008   TVH/BSO--Dr. Quincy Simmonds with TVT  . BLADDER SUSPENSION  09/2008   Dr. Quincy Simmonds  . CERVIX LESION DESTRUCTION  1980   hx abnormal pap--dysplasia  . COLONOSCOPY WITH PROPOFOL Left 10/25/2017   Procedure: COLONOSCOPY WITH PROPOFOL;  Surgeon: Ronnette Juniper, MD;  Location: [facility];  Service: Gastroenterology;  Laterality: Left;    There were no vitals filed for this visit.  Subjective Assessment - 01/11/20 1727    Subjective  Patient has not been in to see [place] in two weeks, she reports that we did not have 5PM appointments, she is a little more frustrated talking about chest pain and costo chondritis.  She reports that she will see an orthopedic MD tomorrow    Currently in Pain?  Yes    Pain Score  5     Pain Location  Chest   thoracic area   Aggravating Factors   stress         [facility] PT Assessment - 01/11/20 0001      Assessment   Medical Diagnosis  neck and chest wall pain    Referring Provider (PT)  Dr Arther Abbott                   [facility] Adult PT Treatment/Exercise - 01/11/20 0001      Modalities   Modalities  Traction      Moist Heat Therapy   Number Minutes Moist Heat  12 Minutes    Moist Heat Location  Cervical;Shoulder      Electrical Stimulation   Electrical Stimulation Location  right upper trap/neck and into the right upper arm    Electrical Stimulation Action  IFC    Electrical Stimulation Parameters  supine    Electrical Stimulation Goals  Pain      Traction   Type of Traction  Cervical    Max (lbs)  12    Time  10      Manual Therapy   Manual therapy comments  some gentle pressure on the ribs with deep breathing, she is very stiff in the ribs and the thoracic vertebrae    Soft tissue mobilization  to the upper traps and into the neck, some MFR in the right pectoral area  Passive ROM  right shoulder all motions to end ranges    Neural Stretch  Ulnar, medial & Radial nerve glide LUE                  PT Long Term Goals - 01/11/20 1742      PT LONG TERM GOAL #1   Title  I with advanced HEP to include taking posture breaks during work ( 12/07/2019)    Status  Achieved      PT LONG TERM GOAL #2   Title  improve upper back strength =/>5-/5 to assist with supporting upright posture ( 12/07/2019)    Status  Partially Met      PT LONG TERM GOAL #3   Title  improve bilat cervical motion =/> 75 degrees with  minimal to no pain ( 12/07/2019)    Status  Partially Met      PT LONG TERM GOAL #4   Title  report =/> 75% reduction in overall pain of neck and chest with daily activity ( 12/07/2019)    Status  Partially Met      PT LONG TERM GOAL #5   Title  Pt will be able to walk for fitness 20 min at a time without increasing pain.     Status  Achieved            Plan - 01/11/20 1740    Clinical Impression Statement  Patient a little more frustrated today with  her pain levels, also chest pain and the costochondritis issues that she reports that she has, I tried some very gentle rib and thoracic mobility, she is very tight and ore with this, needed a lot of cues to relax as she was very guarded due to pain.  I feel like her ROM of the cervical spine and the shoulder is improving but pain levels remain high    PT Next Visit Plan  see if there are any changes with what we tried or if the MD feels like we need to change anything    Consulted and Agree with Plan of Care  Patient       Patient will benefit from skilled therapeutic intervention in order to improve the following deficits and impairments:  Decreased range of motion, Impaired UE functional use, Increased muscle spasms, Pain, Decreased strength, Postural dysfunction  Visit Diagnosis: Muscle weakness (generalized)  Cramp and spasm  Stiffness of right shoulder, not elsewhere classified  Acute pain of right shoulder  Cervicalgia     Problem List Patient Active Problem List   Diagnosis Date Noted  . Closed nondisplaced fracture of proximal phalanx of lesser toe of left foot 11/19/2017  . GI bleed 10/23/2017  . Hypokalemia 10/23/2017  . Nausea vomiting and diarrhea 10/23/2017  . Hematochezia 10/23/2017  . 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 Back pain 04/12/2014  . Arthritis 04/12/2014  . Knee pain 04/12/2014  . Essential hypertension 11/27/2013  . Osteoarthritis of left knee 07/16/2012    Sumner Boast., PT 01/11/2020, 5:43 PM  [place] [address] [place], [place], [address] Phone: [phone]   Fax:  [phone]  Name: Margaret [name] MRN: [id number] Date of Birth: 04/24/1951

## 2020-01-12 ENCOUNTER — Ambulatory Visit: Payer: Federal, State, Local not specified - PPO | Admitting: Orthopedic Surgery

## 2020-01-12 ENCOUNTER — Encounter: Payer: Self-pay | Admitting: Orthopedic Surgery

## 2020-01-12 VITALS — BP 112/67 | HR 77 | Ht 66.0 in | Wt 165.0 lb

## 2020-01-12 DIAGNOSIS — R0782 Intercostal pain: Secondary | ICD-10-CM | POA: Diagnosis not present

## 2020-01-12 DIAGNOSIS — R0789 Other chest pain: Secondary | ICD-10-CM | POA: Diagnosis not present

## 2020-01-12 DIAGNOSIS — M542 Cervicalgia: Secondary | ICD-10-CM | POA: Diagnosis not present

## 2020-01-12 DIAGNOSIS — G8929 Other chronic pain: Secondary | ICD-10-CM
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
## 2020-01-12 NOTE — Patient Instructions (Signed)
You have been scheduled for an MRI scan  We will call your insurance company to do a precertification to get the MRI covered  You will receive a phone call regarding the date of the scan  Dr Aline Brochure will call you with the results

## 2020-01-13 ENCOUNTER — Ambulatory Visit: Payer: Federal, State, Local not specified - PPO | Admitting: Physical Therapy

## 2020-01-13 ENCOUNTER — Encounter: Payer: Self-pay | Admitting: Physical Therapy

## 2020-01-13 ENCOUNTER — Other Ambulatory Visit: Payer: Self-pay

## 2020-01-13 DIAGNOSIS — M6281 Muscle weakness (generalized): Secondary | ICD-10-CM

## 2020-01-13 DIAGNOSIS — M25511 Pain in right shoulder: Secondary | ICD-10-CM

## 2020-01-13 DIAGNOSIS — M25611 Stiffness of right shoulder, not elsewhere classified: Secondary | ICD-10-CM

## 2020-01-13 DIAGNOSIS — R252 Cramp and spasm: Secondary | ICD-10-CM

## 2020-01-13 DIAGNOSIS — M542 Cervicalgia: Secondary | ICD-10-CM

## 2020-01-13 NOTE — Therapy (Signed)
[place], [place], [address] Phone: [phone]   Fax:  [phone]  Physical Therapy Treatment  Patient Details  Name: Margaret [name] MRN: [id number] Date of Birth: 1951/11/11 Referring Provider (PT): Dr Arther Abbott   Encounter Date: 01/13/2020  PT End of Session - 01/13/20 1744    Visit Number  16    Number of Visits  50    Date for PT Re-Evaluation  02/11/20    Authorization Type  BCBS    PT Start Time  1705    PT Stop Time  1757    PT Time Calculation (min)  52 min    Activity Tolerance  Patient tolerated treatment well    Behavior During Therapy  [facility] for tasks assessed/performed       Past Medical History:  Diagnosis Date  . Allergy   . Anemia   . Colitis   . Dyspareunia   . Endometriosis   . GERD (gastroesophageal reflux disease)   . High cholesterol   . Hx of abnormal Pap smear 1980's  . Hypertension   . Osteopenia   . Ulcer   . Urinary incontinence     Past Surgical History:  Procedure Laterality Date  . ABDOMINAL HYSTERECTOMY  09/2008   TVH/BSO--Dr. Quincy Simmonds with TVT  . BLADDER SUSPENSION  09/2008   Dr. Quincy Simmonds  . CERVIX LESION DESTRUCTION  1980   hx abnormal pap--dysplasia  . COLONOSCOPY WITH PROPOFOL Left 10/25/2017   Procedure: COLONOSCOPY WITH PROPOFOL;  Surgeon: Ronnette Juniper, MD;  Location: [facility];  Service: Gastroenterology;  Laterality: Left;    There were no vitals filed for this visit.  Subjective Assessment - 01/13/20 1720    Subjective  Patient continues her frustrationwith her pain and spasms, she will be having an CT and MRI of the neck, shoulder and chest area.  She is please with this    Currently in Pain?  Yes    Pain Score  5     Pain Location  Chest   neck and right shoulder   Pain Relieving Factors  reports that our treatment does help                       [facility] Adult PT Treatment/Exercise - 01/13/20  0001      Neck Exercises: Machines for Strengthening   UBE (Upper Arm Bike)  Level 3 x 5 minutes change direction every minute      Neck Exercises: Standing   Neck Retraction  20 reps;3 secs    Other Standing Exercises  5# shrugs, upper trap and levator stretches    Other Standing Exercises  doorway stretch, W backs      Moist Heat Therapy   Number Minutes Moist Heat  12 Minutes    Moist Heat Location  Cervical;Shoulder      Electrical Stimulation   Electrical Stimulation Location  right upper trap/neck and into the right upper arm    Electrical Stimulation Action  IFC    Electrical Stimulation Parameters  supine    Electrical Stimulation Goals  Pain      Traction   Type of Traction  Cervical    Max (lbs)  12    Time  12      Manual Therapy   Manual therapy comments  some gentle pressure on the ribs with deep breathing, she is very stiff in the ribs and the thoracic vertebrae, gentle  thoracic extension and rotation    Soft tissue mobilization  to the upper traps and into the neck, some MFR in the right pectoral area                  PT Long Term Goals - 01/11/20 1742      PT LONG TERM GOAL #1   Title  I with advanced HEP to include taking posture breaks during work ( 12/07/2019)    Status  Achieved      PT LONG TERM GOAL #2   Title  improve upper back strength =/>5-/5 to assist with supporting upright posture ( 12/07/2019)    Status  Partially Met      PT LONG TERM GOAL #3   Title  improve bilat cervical motion =/> 75 degrees with  minimal to no pain ( 12/07/2019)    Status  Partially Met      PT LONG TERM GOAL #4   Title  report =/> 75% reduction in overall pain of neck and chest with daily activity ( 12/07/2019)    Status  Partially Met      PT LONG TERM GOAL #5   Title  Pt will be able to walk for fitness 20 min at a time without increasing pain.     Status  Achieved            Plan - 01/13/20 1745    Clinical Impression Statement  Patient is  very tight in the thoracic area with extension, rib movements and rotation, she tolerates the stretches,  She is very tight in the mms of the neck and shoulders with spasms.    I tried a little more passive motions of the thoracic spine3    PT Next Visit Plan  continue with current plan    Consulted and Agree with Plan of Care  Patient       Patient will benefit from skilled therapeutic intervention in order to improve the following deficits and impairments:  Decreased range of motion, Impaired UE functional use, Increased muscle spasms, Pain, Decreased strength, Postural dysfunction  Visit Diagnosis: Muscle weakness (generalized)  Cramp and spasm  Stiffness of right shoulder, not elsewhere classified  Acute pain of right shoulder  Cervicalgia     Problem List Patient Active Problem List   Diagnosis Date Noted  . Closed nondisplaced fracture of proximal phalanx of lesser toe of left foot 11/19/2017  . GI bleed 10/23/2017  . Hypokalemia 10/23/2017  . Nausea vomiting and diarrhea 10/23/2017  . Hematochezia 10/23/2017  . 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 Back pain 04/12/2014  . Arthritis 04/12/2014  . Knee pain 04/12/2014  . Essential hypertension 11/27/2013  . Osteoarthritis of left knee 07/16/2012    Sumner Boast., PT 01/13/2020, 5:49 PM  [place] [address] [address] [place], [place], [address] Phone: [phone]   Fax:  [phone]  Name: Kyah Buesing MRN: [id number] Date of Birth: 17-Jun-1951

## 2020-01-26 ENCOUNTER — Ambulatory Visit: Payer: Federal, State, Local not specified - PPO | Admitting: Physical Therapy

## 2020-01-26 ENCOUNTER — Encounter: Payer: Self-pay | Admitting: Physical Therapy

## 2020-01-26 ENCOUNTER — Other Ambulatory Visit: Payer: Self-pay

## 2020-01-26 DIAGNOSIS — M542 Cervicalgia: Secondary | ICD-10-CM

## 2020-01-26 DIAGNOSIS — M6281 Muscle weakness (generalized): Secondary | ICD-10-CM | POA: Diagnosis not present

## 2020-01-26 DIAGNOSIS — M25611 Stiffness of right shoulder, not elsewhere classified: Secondary | ICD-10-CM

## 2020-01-26 DIAGNOSIS — R252 Cramp and spasm: Secondary | ICD-10-CM

## 2020-01-26 DIAGNOSIS — M25511 Pain in right shoulder: Secondary | ICD-10-CM

## 2020-01-26 NOTE — Therapy (Signed)
[place], [place], [address] Phone: [phone]   Fax:  [phone]  Physical Therapy Treatment  Patient Details  Name: Margaret [name] MRN: [id number] Date of Birth: [birth year] Referring Provider (PT): Dr Arther Abbott   Encounter Date: 01/26/2020  PT End of Session - 01/26/20 1738    Visit Number  17    Number of Visits  50    Date for PT Re-Evaluation  02/11/20    Authorization Type  BCBS    Authorization - Number of Visits  50    PT Start Time  1700    PT Stop Time  1749    PT Time Calculation (min)  49 min    Activity Tolerance  Patient tolerated treatment well    Behavior During Therapy  [facility] for tasks assessed/performed       Past Medical History:  Diagnosis Date  . Allergy   . Anemia   . Colitis   . Dyspareunia   . Endometriosis   . GERD (gastroesophageal reflux disease)   . High cholesterol   . Hx of abnormal Pap smear 1980's  . Hypertension   . Osteopenia   . Ulcer   . Urinary incontinence     Past Surgical History:  Procedure Laterality Date  . ABDOMINAL HYSTERECTOMY  09/2008   TVH/BSO--Dr. Quincy Simmonds with TVT  . BLADDER SUSPENSION  09/2008   Dr. Quincy Simmonds  . CERVIX LESION DESTRUCTION  1980   hx abnormal pap--dysplasia  . COLONOSCOPY WITH PROPOFOL Left 10/25/2017   Procedure: COLONOSCOPY WITH PROPOFOL;  Surgeon: Ronnette Juniper, MD;  Location: [facility];  Service: Gastroenterology;  Laterality: Left;    There were no vitals filed for this visit.  Subjective Assessment - 01/26/20 1736    Subjective  Patient reports that she feels like her shoulder motions is improving, reports still a lot of tightness and pain in the upper trap area, she reports that in the next few weeks she will have an MRI    Currently in Pain?  Yes    Pain Score  6     Pain Location  Neck    Pain Orientation  Right    Pain Descriptors / Indicators  Tightness    Pain Relieving  Factors  "I think what you  are doing is helping"                       [facility] Adult PT Treatment/Exercise - 01/26/20 0001      Neck Exercises: Machines for Strengthening   UBE (Upper Arm Bike)  Level 3 x 5 minutes change direction every minute    Cybex Row  20# 2x10    Lat Pull  20# 2x10      Neck Exercises: Theraband   Shoulder External Rotation  20 reps;Red      Neck Exercises: Standing   Other Standing Exercises  doorway stretch, W backs      Moist Heat Therapy   Number Minutes Moist Heat  12 Minutes    Moist Heat Location  Cervical;Shoulder      Electrical Stimulation   Electrical Stimulation Location  right upper trap/neck and into the right upper arm    Electrical Stimulation Action  IFC    Electrical Stimulation Parameters  supine    Electrical Stimulation Goals  Pain      Manual Therapy   Manual Therapy  Soft tissue mobilization;Passive ROM;Neural Stretch;Muscle Energy Technique  Soft tissue mobilization  right upper trap, the cervical parapsinals and the rhomboids    Passive ROM  right shoulder all motions to end ranges    Muscle Energy Technique  some contract relax for the shoulder motions                  PT Long Term Goals - 01/26/20 1740      PT LONG TERM GOAL #2   Title  improve upper back strength =/>5-/5 to assist with supporting upright posture ( 12/07/2019)    Status  Partially Met      PT LONG TERM GOAL #3   Title  improve bilat cervical motion =/> 75 degrees with  minimal to no pain ( 12/07/2019)    Status  Partially Met            Plan - 01/26/20 1739    Clinical Impression Statement  Patient with improved and easier motions of the shoulder, she reports dressing is easier, she still is very tight with significnat spasms in the upper trap and neck area.  She will be having MRI in the next few weeks    PT Next Visit Plan  find out results of the MRI and proceed as needed    Consulted and Agree with Plan of Care   Patient       Patient will benefit from skilled therapeutic intervention in order to improve the following deficits and impairments:  Decreased range of motion, Impaired UE functional use, Increased muscle spasms, Pain, Decreased strength, Postural dysfunction  Visit Diagnosis: Muscle weakness (generalized)  Cramp and spasm  Stiffness of right shoulder, not elsewhere classified  Acute pain of right shoulder  Cervicalgia     Problem List Patient Active Problem List   Diagnosis Date Noted  . Closed nondisplaced fracture of proximal phalanx of lesser toe of left foot 11/19/2017  . GI bleed 10/23/2017  . Hypokalemia 10/23/2017  . Nausea vomiting and diarrhea 10/23/2017  . Hematochezia 10/23/2017  . 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 Back pain 04/12/2014  . Arthritis 04/12/2014  . Knee pain 04/12/2014  . Essential hypertension 11/27/2013  . Osteoarthritis of left knee 07/16/2012    Sumner Boast., PT 01/26/2020, 5:41 PM  [place] [address] [address] [place], [place], [address] Phone: [phone]   Fax:  [phone]  Name: Margaret [name] MRN: [id number] Date of Birth: 1950/12/09

## 2020-01-28 ENCOUNTER — Ambulatory Visit: Payer: Federal, State, Local not specified - PPO | Admitting: Physical Therapy

## 2020-01-28 ENCOUNTER — Ambulatory Visit (HOSPITAL_COMMUNITY)
Admission: RE | Admit: 2020-01-28 | Discharge: 2020-01-28 | Disposition: A | Discharge status: Home / Self Care | Payer: Federal, State, Local not specified - PPO | Source: Ambulatory Visit | Attending: Orthopedic Surgery | Admitting: Orthopedic Surgery

## 2020-01-28 ENCOUNTER — Other Ambulatory Visit: Payer: Self-pay

## 2020-01-28 ENCOUNTER — Encounter: Payer: Self-pay | Admitting: Physical Therapy

## 2020-01-28 DIAGNOSIS — M6281 Muscle weakness (generalized): Secondary | ICD-10-CM

## 2020-01-28 DIAGNOSIS — R0782 Intercostal pain: Secondary | ICD-10-CM | POA: Insufficient documentation

## 2020-01-28 DIAGNOSIS — M25511 Pain in right shoulder: Secondary | ICD-10-CM

## 2020-01-28 DIAGNOSIS — M542 Cervicalgia: Secondary | ICD-10-CM | POA: Diagnosis not present

## 2020-01-28 DIAGNOSIS — R252 Cramp and spasm: Secondary | ICD-10-CM

## 2020-01-28 DIAGNOSIS — M25611 Stiffness of right shoulder, not elsewhere classified: Secondary | ICD-10-CM

## 2020-01-28 NOTE — Therapy (Signed)
[place], [place], [address] Phone: [phone]   Fax:  [phone]  Physical Therapy Treatment  Patient Details  Name: Margaret [name] MRN: [id number] Date of Birth: Jan 16, 1951 Referring Provider (PT): Dr Arther Abbott   Encounter Date: 01/28/2020  PT End of Session - 01/28/20 0923    Visit Number  18    Date for PT Re-Evaluation  02/11/20    PT Start Time  0847    PT Stop Time  0940    PT Time Calculation (min)  53 min    Activity Tolerance  Patient tolerated treatment well    Behavior During Therapy  [facility] for tasks assessed/performed       Past Medical History:  Diagnosis Date  . Allergy   . Anemia   . Colitis   . Dyspareunia   . Endometriosis   . GERD (gastroesophageal reflux disease)   . High cholesterol   . Hx of abnormal Pap smear 1980's  . Hypertension   . Osteopenia   . Ulcer   . Urinary incontinence     Past Surgical History:  Procedure Laterality Date  . ABDOMINAL HYSTERECTOMY  09/2008   TVH/BSO--Dr. Quincy Simmonds with TVT  . BLADDER SUSPENSION  09/2008   Dr. Quincy Simmonds  . CERVIX LESION DESTRUCTION  1980   hx abnormal pap--dysplasia  . COLONOSCOPY WITH PROPOFOL Left 10/25/2017   Procedure: COLONOSCOPY WITH PROPOFOL;  Surgeon: Ronnette Juniper, MD;  Location: [facility];  Service: Gastroenterology;  Laterality: Left;    There were no vitals filed for this visit.  Subjective Assessment - 01/28/20 0850    Subjective  I really felt pretty good after the last treatment    Currently in Pain?  Yes    Pain Location  Neck    Pain Orientation  Right    Pain Descriptors / Indicators  Sore                       [facility] Adult PT Treatment/Exercise - 01/28/20 0001      Neck Exercises: Machines for Strengthening   UBE (Upper Arm Bike)  Level 3 x 5 minutes change direction every minute    Cybex Row  20# 2x10    Lat Pull  20# 2x10    Other Machines for  Strengthening  5# pulleys rows and extension with cues for posture, core activation and to relax her shoulder      Neck Exercises: Seated   Other Seated Exercise  bent over row 3#, 1# extnesion and 1# horizontal abduction      Moist Heat Therapy   Number Minutes Moist Heat  12 Minutes    Moist Heat Location  Cervical;Shoulder      Electrical Stimulation   Electrical Stimulation Location  right upper trap/neck and into the right upper arm    Electrical Stimulation Action  IFC    Electrical Stimulation Parameters  supine    Electrical Stimulation Goals  Pain      Manual Therapy   Manual Therapy  Soft tissue mobilization;Passive ROM;Neural Stretch;Muscle Energy Technique    Soft tissue mobilization  right upper trap, the cervical parapsinals and the rhomboids    Passive ROM  right shoulder all motions to end ranges    Muscle Energy Technique  some contract relax for the shoulder motions                  PT Long Term  Goals - 01/26/20 1740      PT LONG TERM GOAL #2   Title  improve upper back strength =/>5-/5 to assist with supporting upright posture ( 12/07/2019)    Status  Partially Met      PT LONG TERM GOAL #3   Title  improve bilat cervical motion =/> 75 degrees with  minimal to no pain ( 12/07/2019)    Status  Partially Met            Plan - 01/28/20 0924    Clinical Impression Statement  She reports that our treatment is helping, she does seem to have less guarding and spasms,  She is moving better.  Will have MRI and hopefuly know the results next week so we can progress as needed    PT Next Visit Plan  find out results of the MRI and proceed as needed    Consulted and Agree with Plan of Care  Patient       Patient will benefit from skilled therapeutic intervention in order to improve the following deficits and impairments:  Decreased range of motion, Impaired UE functional use, Increased muscle spasms, Pain, Decreased strength, Postural dysfunction  Visit  Diagnosis: Muscle weakness (generalized)  Cramp and spasm  Stiffness of right shoulder, not elsewhere classified  Acute pain of right shoulder  Cervicalgia     Problem List Patient Active Problem List   Diagnosis Date Noted  . Closed nondisplaced fracture of proximal phalanx of lesser toe of left foot 11/19/2017  . GI bleed 10/23/2017  . Hypokalemia 10/23/2017  . Nausea vomiting and diarrhea 10/23/2017  . Hematochezia 10/23/2017  . 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 Back pain 04/12/2014  . Arthritis 04/12/2014  . Knee pain 04/12/2014  . Essential hypertension 11/27/2013  . Osteoarthritis of left knee 07/16/2012    Sumner Boast., PT 01/28/2020, 9:26 AM  [place] [address] [place], [place], [address] Phone: [phone]   Fax:  [phone]  Name: Jamisyn Langer MRN: [id number] Date of Birth: [birth year]

## 2020-01-31 ENCOUNTER — Other Ambulatory Visit: Payer: Self-pay

## 2020-01-31 ENCOUNTER — Ambulatory Visit: Payer: Federal, State, Local not specified - PPO | Admitting: Physical Therapy

## 2020-01-31 ENCOUNTER — Encounter: Payer: Self-pay | Admitting: Physical Therapy

## 2020-01-31 DIAGNOSIS — M25611 Stiffness of right shoulder, not elsewhere classified: Secondary | ICD-10-CM

## 2020-01-31 DIAGNOSIS — M6281 Muscle weakness (generalized): Secondary | ICD-10-CM

## 2020-01-31 DIAGNOSIS — M25511 Pain in right shoulder: Secondary | ICD-10-CM

## 2020-01-31 DIAGNOSIS — M542 Cervicalgia: Secondary | ICD-10-CM

## 2020-01-31 DIAGNOSIS — R252 Cramp and spasm: Secondary | ICD-10-CM

## 2020-01-31 NOTE — Therapy (Signed)
[address] [place], [place], [address] Phone: [phone]   Fax:  [phone]  Physical Therapy Treatment  Patient Details  Name: Margaret [name] MRN: [id number] Date of Birth: 02-11-1951 Referring Provider (PT): Dr Arther Abbott   Encounter Date: 01/31/2020  PT End of Session - 01/31/20 1744    Visit Number  19    Number of Visits  50    Authorization Type  BCBS    PT Start Time  2633    PT Stop Time  1755    PT Time Calculation (min)  60 min    Activity Tolerance  Patient tolerated treatment well    Behavior During Therapy  [facility] for tasks assessed/performed       Past Medical History:  Diagnosis Date  . Allergy   . Anemia   . Colitis   . Dyspareunia   . Endometriosis   . GERD (gastroesophageal reflux disease)   . High cholesterol   . Hx of abnormal Pap smear 1980's  . Hypertension   . Osteopenia   . Ulcer   . Urinary incontinence     Past Surgical History:  Procedure Laterality Date  . ABDOMINAL HYSTERECTOMY  09/2008   TVH/BSO--Dr. Quincy Simmonds with TVT  . BLADDER SUSPENSION  09/2008   Dr. Quincy Simmonds  . CERVIX LESION DESTRUCTION  1980   hx abnormal pap--dysplasia  . COLONOSCOPY WITH PROPOFOL Left 10/25/2017   Procedure: COLONOSCOPY WITH PROPOFOL;  Surgeon: Ronnette Juniper, MD;  Location: [facility];  Service: Gastroenterology;  Laterality: Left;    There were no vitals filed for this visit.  Subjective Assessment - 01/31/20 1737    Subjective  Patient had MRI of the neck and CT of the chest last week, it looks like the chest was negative, her neck showed spondylosis and mild to moderate narrowing c4-c7, she c/o chest pain and tightness,    Currently in Pain?  Yes    Pain Score  6     Pain Location  Neck    Aggravating Factors   strress, sitting at desk                       [facility] Adult PT Treatment/Exercise - 01/31/20 0001      Neck Exercises: Machines for  Strengthening   UBE (Upper Arm Bike)  Level 3 x 5 minutes change direction every minute    Other Machines for Strengthening  5# pulleys rows and extension with cues for posture, core activation and to relax her shoulder      Neck Exercises: Theraband   Shoulder External Rotation  20 reps;Red      Neck Exercises: Standing   Other Standing Exercises  5# shrugs, upper trap and levator stretches    Other Standing Exercises  doorway stretch, W backs      Moist Heat Therapy   Number Minutes Moist Heat  10 Minutes    Moist Heat Location  Cervical;Shoulder      Electrical Stimulation   Electrical Stimulation Location  necdk and upper traps    Electrical Stimulation Action  IFC    Electrical Stimulation Parameters  supine    Electrical Stimulation Goals  Pain      Traction   Type of Traction  Cervical    Max (lbs)  12    Time  10      Manual Therapy   Manual Therapy  Soft tissue mobilization;Passive ROM;Neural Stretch;Muscle  Energy Technique    Soft tissue mobilization  right upper trap, the cervical parapsinals and the upper pecs                  PT Long Term Goals - 01/26/20 1740      PT LONG TERM GOAL #2   Title  improve upper back strength =/>5-/5 to assist with supporting upright posture ( 12/07/2019)    Status  Partially Met      PT LONG TERM GOAL #3   Title  improve bilat cervical motion =/> 75 degrees with  minimal to no pain ( 12/07/2019)    Status  Partially Met            Plan - 01/31/20 1745    Clinical Impression Statement  Patients CT of the chest was unremarkable, I added some STM of the upper and outer pecs as she c/o chest issues, tightness and pain, and has been worked up numerous times to assure no MI, she is very tender out at the [facility] joint along the pectoral mms, the MRI of the cervical spine showed spondylosis and mild to moderate narrowing from C4-7, so we are going to continue with the traction    PT Next Visit Plan  she will see MD next week  and will have NCV test    Consulted and Agree with Plan of Care  Patient       Patient will benefit from skilled therapeutic intervention in order to improve the following deficits and impairments:  Decreased range of motion, Impaired UE functional use, Increased muscle spasms, Pain, Decreased strength, Postural dysfunction  Visit Diagnosis: Muscle weakness (generalized)  Cramp and spasm  Stiffness of right shoulder, not elsewhere classified  Acute pain of right shoulder  Cervicalgia     Problem List Patient Active Problem List   Diagnosis Date Noted  . Closed nondisplaced fracture of proximal phalanx of lesser toe of left foot 11/19/2017  . GI bleed 10/23/2017  . Hypokalemia 10/23/2017  . Nausea vomiting and diarrhea 10/23/2017  . Hematochezia 10/23/2017  . 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 Back pain 04/12/2014  . Arthritis 04/12/2014  . Knee pain 04/12/2014  . Essential hypertension 11/27/2013  . Osteoarthritis of left knee 07/16/2012    Sumner Boast., PT 01/31/2020, 5:48 PM  [place] [address] [place], [place], [address] Phone: [phone]   Fax:  [phone]  Name: Margaret [name] MRN: [id number] Date of Birth: Jul 02, 1951

## 2020-02-01 ENCOUNTER — Encounter: Payer: Self-pay | Admitting: Certified Nurse Midwife

## 2020-02-02 ENCOUNTER — Encounter: Payer: Self-pay | Admitting: Physical Therapy

## 2020-02-02 ENCOUNTER — Other Ambulatory Visit: Payer: Self-pay

## 2020-02-02 ENCOUNTER — Ambulatory Visit: Payer: Federal, State, Local not specified - PPO | Admitting: Physical Therapy

## 2020-02-02 DIAGNOSIS — M542 Cervicalgia: Secondary | ICD-10-CM

## 2020-02-02 DIAGNOSIS — M6281 Muscle weakness (generalized): Secondary | ICD-10-CM

## 2020-02-02 DIAGNOSIS — M25611 Stiffness of right shoulder, not elsewhere classified: Secondary | ICD-10-CM

## 2020-02-02 DIAGNOSIS — R252 Cramp and spasm: Secondary | ICD-10-CM

## 2020-02-02 DIAGNOSIS — M25511 Pain in right shoulder: Secondary | ICD-10-CM

## 2020-02-02 NOTE — Therapy (Signed)
[place], [place], [address] Phone: [phone]   Fax:  [phone]  Physical Therapy Treatment  Patient Details  Name: Margaret [name] MRN: [id number] Date of Birth: 07/27/1951 Referring Provider (PT): Dr Arther Abbott   Encounter Date: 02/02/2020  PT End of Session - 02/02/20 1734    Visit Number  20    Date for PT Re-Evaluation  02/11/20    Authorization Type  BC[employer]    PT Start Time  1700    PT Stop Time  1759    PT Time Calculation (min)  59 min    Activity Tolerance  Patient tolerated treatment well    Behavior During Therapy  [facility] for tasks assessed/performed       Past Medical History:  Diagnosis Date  . Allergy   . Anemia   . Colitis   . Dyspareunia   . Endometriosis   . GERD (gastroesophageal reflux disease)   . High cholesterol   . Hx of abnormal Pap smear 1980's  . Hypertension   . Osteopenia   . Ulcer   . Urinary incontinence     Past Surgical History:  Procedure Laterality Date  . ABDOMINAL HYSTERECTOMY  09/2008   TVH/BSO--Dr. Quincy Simmonds with TVT  . BLADDER SUSPENSION  09/2008   Dr. Quincy Simmonds  . CERVIX LESION DESTRUCTION  1980   hx abnormal pap--dysplasia  . COLONOSCOPY WITH PROPOFOL Left 10/25/2017   Procedure: COLONOSCOPY WITH PROPOFOL;  Surgeon: Ronnette Juniper, MD;  Location: [facility];  Service: Gastroenterology;  Laterality: Left;    There were no vitals filed for this visit.  Subjective Assessment - 02/02/20 1703    Subjective  Patient reports that she was pretty sore after the last treatment    Currently in Pain?  Yes    Pain Score  5     Pain Location  Neck   some pectoral pain                      [facility] Adult PT Treatment/Exercise - 02/02/20 0001      Neck Exercises: Machines for Strengthening   UBE (Upper Arm Bike)  Level 3 x 5 minutes change direction every minute    Other Machines for Strengthening  5# pulleys rows  and extension with cues for posture, core activation and to relax her shoulder      Neck Exercises: Theraband   Shoulder External Rotation  20 reps;Red      Neck Exercises: Standing   Other Standing Exercises  doorway stretch, W backs      Neck Exercises: Seated   W Back  20 reps      Moist Heat Therapy   Number Minutes Moist Heat  10 Minutes    Moist Heat Location  Cervical;Shoulder      Electrical Stimulation   Electrical Stimulation Location  cervical and upper trap area    Electrical Stimulation Action  IFC    Electrical Stimulation Parameters  supine    Electrical Stimulation Goals  Pain      Manual Therapy   Manual Therapy  Soft tissue mobilization;Passive ROM;Neural Stretch;Muscle Energy Technique    Soft tissue mobilization  right upper trap, the cervical parapsinals and the upper pecs                  PT Long Term Goals - 02/02/20 1736      PT LONG TERM GOAL #4  Title  report =/> 75% reduction in overall pain of neck and chest with daily activity ( 12/07/2019)    Status  Partially Met            Plan - 02/02/20 1735    Clinical Impression Statement  Patient is really tight and tender inthe pectorals, she has knots that seem like trigger points in the left upper trap and the left cervical area.  I worked on these today and educated her about dry needling.    PT Next Visit Plan  she will see MD next week and will have NCV test    Consulted and Agree with Plan of Care  Patient       Patient will benefit from skilled therapeutic intervention in order to improve the following deficits and impairments:  Decreased range of motion, Impaired UE functional use, Increased muscle spasms, Pain, Decreased strength, Postural dysfunction  Visit Diagnosis: Muscle weakness (generalized)  Cramp and spasm  Stiffness of right shoulder, not elsewhere classified  Acute pain of right shoulder  Cervicalgia     Problem List Patient Active Problem List    Diagnosis Date Noted  . Closed nondisplaced fracture of proximal phalanx of lesser toe of left foot 11/19/2017  . GI bleed 10/23/2017  . Hypokalemia 10/23/2017  . Nausea vomiting and diarrhea 10/23/2017  . Hematochezia 10/23/2017  . 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 Back pain 04/12/2014  . Arthritis 04/12/2014  . Knee pain 04/12/2014  . Essential hypertension 11/27/2013  . Osteoarthritis of left knee 07/16/2012    Sumner Boast., PT 02/02/2020, 5:37 PM  [address] [place], [place], [address] Phone: [phone]   Fax:  [phone]  Name: Sabina Beavers MRN: [id number] Date of Birth: 11/22/1950

## 2020-02-02 NOTE — Patient Instructions (Signed)

## 2020-02-14 ENCOUNTER — Telehealth: Payer: Self-pay | Admitting: Radiology

## 2020-02-14 NOTE — Telephone Encounter (Signed)
Will call tues

## 2020-02-14 NOTE — Telephone Encounter (Signed)
Patient called, she has not heard from you regarding results of her CT and MRI scans.  Please call her asap to advise.  She has been continuing to go to PT.

## 2020-02-16 ENCOUNTER — Other Ambulatory Visit: Payer: Self-pay

## 2020-02-16 ENCOUNTER — Encounter: Payer: Self-pay | Admitting: Physical Therapy

## 2020-02-16 ENCOUNTER — Ambulatory Visit: Payer: Federal, State, Local not specified - PPO | Attending: Orthopedic Surgery | Admitting: Physical Therapy

## 2020-02-16 DIAGNOSIS — M6281 Muscle weakness (generalized): Secondary | ICD-10-CM

## 2020-02-16 DIAGNOSIS — M25611 Stiffness of right shoulder, not elsewhere classified: Secondary | ICD-10-CM | POA: Diagnosis present

## 2020-02-16 DIAGNOSIS — M25511 Pain in right shoulder: Secondary | ICD-10-CM | POA: Diagnosis present

## 2020-02-16 DIAGNOSIS — M542 Cervicalgia: Secondary | ICD-10-CM

## 2020-02-16 DIAGNOSIS — R252 Cramp and spasm: Secondary | ICD-10-CM | POA: Diagnosis present

## 2020-02-16 NOTE — Therapy (Signed)
[place], [place], [address] Phone: [phone]   Fax:  [phone]  Physical Therapy Treatment  Patient Details  Name: Margaret [name] MRN: [id number] Date of Birth: Nov 13, 1950 Referring Provider (PT): Dr Arther Abbott   Encounter Date: 02/16/2020  PT End of Session - 02/16/20 1745    Visit Number  21    Number of Visits  50    Date for PT Re-Evaluation  03/17/20    Authorization Type  [employer]BS    PT Start Time  1700    PT Stop Time  1755    PT Time Calculation (min)  55 min    Activity Tolerance  Patient tolerated treatment well    Behavior During Therapy  Anxious       Past Medical History:  Diagnosis Date  . Allergy   . Anemia   . Colitis   . Dyspareunia   . Endometriosis   . GERD (gastroesophageal reflux disease)   . High cholesterol   . Hx of abnormal Pap smear 1980's  . Hypertension   . Osteopenia   . Ulcer   . Urinary incontinence     Past Surgical History:  Procedure Laterality Date  . ABDOMINAL HYSTERECTOMY  09/2008   TVH/BSO--Dr. Quincy Simmonds with TVT  . BLADDER SUSPENSION  09/2008   Dr. Quincy Simmonds  . CERVIX LESION DESTRUCTION  1980   hx abnormal pap--dysplasia  . COLONOSCOPY WITH PROPOFOL Left 10/25/2017   Procedure: COLONOSCOPY WITH PROPOFOL;  Surgeon: Ronnette Juniper, MD;  Location: [facility];  Service: Gastroenterology;  Laterality: Left;    There were no vitals filed for this visit.  Subjective Assessment - 02/16/20 1703    Subjective  Patient continues to be frustrated, she does report an incident this weekend where she was "swatting at a moth" and has had some increased pain in the right shoulder    Currently in Pain?  Yes    Pain Score  4     Pain Location  Shoulder    Pain Orientation  Right    Aggravating Factors   fast arm movements         [facility] PT Assessment - 02/16/20 0001      Assessment   Medical Diagnosis  neck and chest wall pain    Referring Provider (PT)  Dr Arther Abbott                   [facility] Adult PT Treatment/Exercise - 02/16/20 0001      Neck Exercises: Machines for Strengthening   UBE (Upper Arm Bike)  Level 3 x 5 minutes change direction every minute      Neck Exercises: Standing   Other Standing Exercises  5# shrugs, upper trap and levator stretches    Other Standing Exercises  doorway stretch, W backs      Neck Exercises: Seated   Neck Retraction  20 reps;3 secs    W Back  20 reps      Moist Heat Therapy   Number Minutes Moist Heat  10 Minutes    Moist Heat Location  Cervical;Shoulder      Electrical Stimulation   Electrical Stimulation Location  cervical and upper trap area    Electrical Stimulation Action  IFC    Electrical Stimulation Parameters  supine    Electrical Stimulation Goals  Pain      Manual Therapy   Manual Therapy  Soft tissue mobilization;Passive ROM;Neural Stretch;Muscle Energy  Technique    Soft tissue mobilization  right upper trap, the cervical parapsinals and the upper pecs    Passive ROM  right shoulder all motions to end ranges, some manual shoulder depression    Neural Stretch  Ulnar, medial & Radial nerve glide LUE                  PT Long Term Goals - 02/16/20 1748      PT LONG TERM GOAL #1   Title  I with advanced HEP to include taking posture breaks during work ( 12/07/2019)    Status  Achieved      PT LONG TERM GOAL #2   Title  improve upper back strength =/>5-/5 to assist with supporting upright posture ( 12/07/2019)    Status  Partially Met      PT LONG TERM GOAL #3   Title  improve bilat cervical motion =/> 75 degrees with  minimal to no pain ( 12/07/2019)    Status  Partially Met      PT LONG TERM GOAL #4   Title  report =/> 75% reduction in overall pain of neck and chest with daily activity ( 12/07/2019)    Status  Partially Met      PT LONG TERM GOAL #5   Title  Pt will be able to walk for fitness 20 min at a time without  increasing pain.     Status  Achieved            Plan - 02/16/20 1745    Clinical Impression Statement  Patient continues to be anxious and have a lot of quesitons about the diagnoses and why she is having pain, I have tried to explain the reasons behind all of the treatment, with better posture, scapular stability and good flexibility the idea is to take stress off of the neck and shoulders.  She remains very tight in the right UE, pecs and upper trap, she does report positive changes that she is walking and trying to do more and watching her posture, she does report that she really was feeling better until she swatted at a moth and this has set her back and this frustrates her that swatting at something can cause her to have more pain    PT Next Visit Plan  we will continue to address the spasms, posture, flexibility and strength, she will be having a NCV test in the near future    Consulted and Agree with Plan of Care  Patient       Patient will benefit from skilled therapeutic intervention in order to improve the following deficits and impairments:  Decreased range of motion, Impaired UE functional use, Increased muscle spasms, Pain, Decreased strength, Postural dysfunction  Visit Diagnosis: Muscle weakness (generalized) - Plan: PT plan of care cert/re-cert  Cramp and spasm - Plan: PT plan of care cert/re-cert  Stiffness of right shoulder, not elsewhere classified - Plan: PT plan of care cert/re-cert  Acute pain of right shoulder - Plan: PT plan of care cert/re-cert  Cervicalgia - Plan: PT plan of care cert/re-cert     Problem List Patient Active Problem List   Diagnosis Date Noted  . Closed nondisplaced fracture of proximal phalanx of lesser toe of left foot 11/19/2017  . GI bleed 10/23/2017  . Hypokalemia 10/23/2017  . Nausea vomiting and diarrhea 10/23/2017  . Hematochezia 10/23/2017  . 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 Back pain 04/12/2014  .  Arthritis 04/12/2014  .  Knee pain 04/12/2014  . Essential hypertension 11/27/2013  . Osteoarthritis of left knee 07/16/2012    Sumner Boast., PT 02/16/2020, 5:50 PM  [place] [address] [place], [place], [address] Phone: [phone]   Fax:  [phone]  Name: Margaret [name] MRN: [id number] Date of Birth: 30-Dec-1950

## 2020-02-17 ENCOUNTER — Telehealth: Payer: Self-pay | Admitting: Orthopedic Surgery

## 2020-02-17 NOTE — Telephone Encounter (Signed)
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

## 2020-02-22 ENCOUNTER — Encounter: Payer: Self-pay | Admitting: Physical Therapy

## 2020-02-22 ENCOUNTER — Other Ambulatory Visit: Payer: Self-pay

## 2020-02-22 ENCOUNTER — Ambulatory Visit: Payer: Federal, State, Local not specified - PPO | Admitting: Physical Therapy

## 2020-02-22 DIAGNOSIS — M25611 Stiffness of right shoulder, not elsewhere classified: Secondary | ICD-10-CM

## 2020-02-22 DIAGNOSIS — M542 Cervicalgia: Secondary | ICD-10-CM

## 2020-02-22 DIAGNOSIS — M6281 Muscle weakness (generalized): Secondary | ICD-10-CM | POA: Diagnosis not present

## 2020-02-22 DIAGNOSIS — M25511 Pain in right shoulder: Secondary | ICD-10-CM

## 2020-02-22 DIAGNOSIS — R252 Cramp and spasm: Secondary | ICD-10-CM

## 2020-02-22 NOTE — Therapy (Signed)
[place], [place], [address] Phone: [phone]   Fax:  [phone]  Physical Therapy Treatment  Patient Details  Name: Margaret [name] MRN: [id number] Date of Birth: 30-Sep-1951 Referring Provider (PT): Dr Arther Abbott   Encounter Date: 02/22/2020  PT End of Session - 02/22/20 1744    Visit Number  22    Date for PT Re-Evaluation  03/17/20    Authorization Type  [employer]    PT Start Time  1652    PT Stop Time  1754    PT Time Calculation (min)  62 min    Activity Tolerance  Patient tolerated treatment well    Behavior During Therapy  Anxious       Past Medical History:  Diagnosis Date  . Allergy   . Anemia   . Colitis   . Dyspareunia   . Endometriosis   . GERD (gastroesophageal reflux disease)   . High cholesterol   . Hx of abnormal Pap smear 1980's  . Hypertension   . Osteopenia   . Ulcer   . Urinary incontinence     Past Surgical History:  Procedure Laterality Date  . ABDOMINAL HYSTERECTOMY  09/2008   TVH/BSO--Dr. Quincy Simmonds with TVT  . BLADDER SUSPENSION  09/2008   Dr. Quincy Simmonds  . CERVIX LESION DESTRUCTION  1980   hx abnormal pap--dysplasia  . COLONOSCOPY WITH PROPOFOL Left 10/25/2017   Procedure: COLONOSCOPY WITH PROPOFOL;  Surgeon: Ronnette Juniper, MD;  Location: [facility];  Service: Gastroenterology;  Laterality: Left;    There were no vitals filed for this visit.  Subjective Assessment - 02/22/20 1708    Subjective  Patient reports that she is still unsure of her symptoms.  I asked her if she feels like she has anxiety and is this causing some issues, she reports that she feels like she wants to know what is wrong    Currently in Pain?  Yes    Pain Score  3     Pain Location  Neck    Pain Orientation  Right    Pain Descriptors / Indicators  Sore;Tightness    Aggravating Factors   arm movements right                       [facility] Adult PT  Treatment/Exercise - 02/22/20 0001      Neck Exercises: Machines for Strengthening   UBE (Upper Arm Bike)  Level 3 x 5 minutes change direction every minute    Cybex Row  20# 2x10    Lat Pull  20# 2x10    Other Machines for Strengthening  5# pulleys rows and extension with cues for posture, core activation and to relax her shoulder, triceps 20#, biceps 5#      Neck Exercises: Theraband   Shoulder External Rotation  20 reps;Red      Neck Exercises: Standing   Other Standing Exercises  anterior chest and neck stretch with hands behind back and chin toward the cieling    Other Standing Exercises  doorway stretch, W backs some overpressure      Neck Exercises: Supine   Neck Retraction  20 reps;3 secs    Neck Retraction Limitations  head on ball      Moist Heat Therapy   Number Minutes Moist Heat  10 Minutes    Moist Heat Location  Cervical;Shoulder      Electrical Stimulation   Electrical Stimulation Location  cervical and upper trap area    Electrical Stimulation Action  IFC    Electrical Stimulation Parameters  sitting    Electrical Stimulation Goals  Pain      Manual Therapy   Manual Therapy  Soft tissue mobilization;Passive ROM;Neural Stretch;Muscle Energy Technique    Soft tissue mobilization  right upper trap, the cervical parapsinals and the upper pecs    Passive ROM  right shoulder all motions to end ranges, some manual shoulder depression                  PT Long Term Goals - 02/16/20 1748      PT LONG TERM GOAL #1   Title  I with advanced HEP to include taking posture breaks during work ( 12/07/2019)    Status  Achieved      PT LONG TERM GOAL #2   Title  improve upper back strength =/>5-/5 to assist with supporting upright posture ( 12/07/2019)    Status  Partially Met      PT LONG TERM GOAL #3   Title  improve bilat cervical motion =/> 75 degrees with  minimal to no pain ( 12/07/2019)    Status  Partially Met      PT LONG TERM GOAL #4   Title  report  =/> 75% reduction in overall pain of neck and chest with daily activity ( 12/07/2019)    Status  Partially Met      PT LONG TERM GOAL #5   Title  Pt will be able to walk for fitness 20 min at a time without increasing pain.     Status  Achieved            Plan - 02/22/20 1745    Clinical Impression Statement  Paitnet has a lot of c/o pain and tightness but after the exercises she reports that she feels better and stronger, she is tight in the upper traps and the cervical paraspinals, she has a lot of tension in the pectorals, needs cues to decreased trunk compenstaion and to stop elevating the shoulders    PT Next Visit Plan  we will continue to address the spasms, posture, flexibility and strength, she will be having a NCV test in the near future    Consulted and Agree with Plan of Care  Patient       Patient will benefit from skilled therapeutic intervention in order to improve the following deficits and impairments:  Decreased range of motion, Impaired UE functional use, Increased muscle spasms, Pain, Decreased strength, Postural dysfunction  Visit Diagnosis: Muscle weakness (generalized)  Cramp and spasm  Stiffness of right shoulder, not elsewhere classified  Acute pain of right shoulder  Cervicalgia     Problem List Patient Active Problem List   Diagnosis Date Noted  . Closed nondisplaced fracture of proximal phalanx of lesser toe of left foot 11/19/2017  . GI bleed 10/23/2017  . Hypokalemia 10/23/2017  . Nausea vomiting and diarrhea 10/23/2017  . Hematochezia 10/23/2017  . 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 Back pain 04/12/2014  . Arthritis 04/12/2014  . Knee pain 04/12/2014  . Essential hypertension 11/27/2013  . Osteoarthritis of left knee 07/16/2012    Sumner Boast., PT 02/22/2020, 5:47 PM  [place] [address] [place], [place], [address] Phone: [phone]   Fax:   [phone]  Name: Kennah Hehr MRN: [id number] Date of Birth: 1951-10-15

## 2020-02-24 ENCOUNTER — Ambulatory Visit: Payer: Federal, State, Local not specified - PPO | Admitting: Physical Therapy

## 2020-02-28 ENCOUNTER — Emergency Department (HOSPITAL_BASED_OUTPATIENT_CLINIC_OR_DEPARTMENT_OTHER): Payer: Federal, State, Local not specified - PPO

## 2020-02-28 ENCOUNTER — Encounter (HOSPITAL_BASED_OUTPATIENT_CLINIC_OR_DEPARTMENT_OTHER): Payer: Self-pay | Admitting: Emergency Medicine

## 2020-02-28 ENCOUNTER — Other Ambulatory Visit: Payer: Self-pay

## 2020-02-28 ENCOUNTER — Emergency Department (HOSPITAL_BASED_OUTPATIENT_CLINIC_OR_DEPARTMENT_OTHER)
Admission: EM | Admit: 2020-02-28 | Discharge: 2020-02-28 | Disposition: A | Discharge status: Home / Self Care | Payer: Federal, State, Local not specified - PPO | Attending: Emergency Medicine | Admitting: Emergency Medicine

## 2020-02-28 DIAGNOSIS — I10 Essential (primary) hypertension: Secondary | ICD-10-CM | POA: Diagnosis not present

## 2020-02-28 DIAGNOSIS — Z79899 Other long term (current) drug therapy: Secondary | ICD-10-CM | POA: Insufficient documentation

## 2020-02-28 DIAGNOSIS — R079 Chest pain, unspecified: Secondary | ICD-10-CM | POA: Diagnosis not present

## 2020-02-28 LAB — TROPONIN I (HIGH SENSITIVITY): Troponin I (High Sensitivity): 3 ng/L (ref <18)

## 2020-02-28 LAB — BASIC METABOLIC PANEL
Anion gap: 10 (ref 5–15)
BUN: 9 mg/dL (ref 8–23)
CO2: 28 mmol/L (ref 22–32)
Calcium: 9.1 mg/dL (ref 8.9–10.3)
Chloride: 101 mmol/L (ref 98–111)
Creatinine, Ser: 0.7 mg/dL (ref 0.44–1.00)
GFR calc Af Amer: >60 mL/min (ref >60)
GFR calc non Af Amer: >60 mL/min (ref >60)
Glucose, Bld: 93 mg/dL (ref 70–99)
Potassium: 3.4 mmol/L — ABNORMAL LOW (ref 3.5–5.1)
Sodium: 139 mmol/L (ref 135–145)

## 2020-02-28 LAB — CBC
HCT: 37.2 % (ref 36.0–46.0)
Hemoglobin: 11.5 g/dL — ABNORMAL LOW (ref 12.0–15.0)
MCH: 26.8 pg (ref 26.0–34.0)
MCHC: 30.9 g/dL (ref 30.0–36.0)
MCV: 86.7 fL (ref 80.0–100.0)
Platelets: 244 10*3/uL (ref 150–400)
RBC: 4.29 MIL/uL (ref 3.87–5.11)
RDW: 14.9 % (ref 11.5–15.5)
WBC: 4.6 10*3/uL (ref 4.0–10.5)
nRBC: 0 % (ref 0.0–0.2)
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
## 2020-02-28 NOTE — Discharge Instructions (Addendum)
You were seen in the emergency department for evaluation of chest pain.  You had blood work EKG and a chest x-ray that did not show any obvious abnormalities.  Please contact your primary care doctor for follow-up.  If you have any worsening or concerning symptoms please return to the emergency department.

## 2020-02-28 NOTE — ED Notes (Signed)
Left side cp w some pain yesterday  Worse today  States some increased pain w palpation

## 2020-02-28 NOTE — ED Triage Notes (Signed)
Chest pain since last night with mild SOB

## 2020-03-03 ENCOUNTER — Other Ambulatory Visit: Payer: Self-pay

## 2020-03-03 ENCOUNTER — Encounter: Payer: Self-pay | Admitting: Physical Therapy

## 2020-03-03 ENCOUNTER — Ambulatory Visit: Payer: Federal, State, Local not specified - PPO | Admitting: Physical Therapy

## 2020-03-03 DIAGNOSIS — M25611 Stiffness of right shoulder, not elsewhere classified: Secondary | ICD-10-CM

## 2020-03-03 DIAGNOSIS — M25511 Pain in right shoulder: Secondary | ICD-10-CM

## 2020-03-03 DIAGNOSIS — M542 Cervicalgia: Secondary | ICD-10-CM

## 2020-03-03 DIAGNOSIS — M6281 Muscle weakness (generalized): Secondary | ICD-10-CM | POA: Diagnosis not present

## 2020-03-03 DIAGNOSIS — R252 Cramp and spasm: Secondary | ICD-10-CM

## 2020-03-03 NOTE — Therapy (Signed)
[facility] [address] [place], [place], [address] Phone: [phone]   Fax:  [phone]  Physical Therapy Treatment  Patient Details  Name: Margaret [name] MRN: [id number] Date of Birth: 10/13/1951 Referring Provider (PT): Dr Arther Abbott   Encounter Date: 03/03/2020  PT End of Session - 03/03/20 1023    Visit Number  23    Date for PT Re-Evaluation  03/17/20    PT Start Time  0930    PT Stop Time  1028    PT Time Calculation (min)  58 min    Activity Tolerance  Patient tolerated treatment well    Behavior During Therapy  Anxious;WFL for tasks assessed/performed       Past Medical History:  Diagnosis Date  . Allergy   . Anemia   . Colitis   . Dyspareunia   . Endometriosis   . GERD (gastroesophageal reflux disease)   . High cholesterol   . Hx of abnormal Pap smear 1980's  . Hypertension   . Osteopenia   . Ulcer   . Urinary incontinence     Past Surgical History:  Procedure Laterality Date  . ABDOMINAL HYSTERECTOMY  09/2008   TVH/BSO--Dr. Quincy Simmonds with TVT  . BLADDER SUSPENSION  09/2008   Dr. Quincy Simmonds  . CERVIX LESION DESTRUCTION  1980   hx abnormal pap--dysplasia  . COLONOSCOPY WITH PROPOFOL Left 10/25/2017   Procedure: COLONOSCOPY WITH PROPOFOL;  Surgeon: Ronnette Juniper, MD;  Location: [facility];  Service: Gastroenterology;  Laterality: Left;    There were no vitals filed for this visit.  Subjective Assessment - 03/03/20 0931    Subjective  I am feeling a little better, I started walking some, still tight and tender in the chest area    Currently in Pain?  Yes    Pain Score  3     Pain Location  Chest    Pain Orientation  Right    Aggravating Factors   computer work    Pain Relieving Factors  walking         [facility] PT Assessment - 03/03/20 0001      AROM   Overall AROM Comments  Cervical ROM is WNL's but tight and paingul                   [facility] Adult PT  Treatment/Exercise - 03/03/20 0001      Neck Exercises: Machines for Strengthening   Nustep  L4 x 5 min     Cybex Row  20# 2x10    Lat Pull  20# 2x10    Other Machines for Strengthening  5# pulleys rows and extension with cues for posture, core activation and to relax her shoulder, triceps 20#, biceps 5#      Neck Exercises: Standing   Other Standing Exercises  anterior chest and neck stretch with hands behind back and chin toward the cieling, 3# ER back to wall    Other Standing Exercises  doorway stretch, W backs some overpressure      Neck Exercises: Supine   Neck Retraction  20 reps;3 secs    Neck Retraction Limitations  head on ball      Moist Heat Therapy   Number Minutes Moist Heat  10 Minutes    Moist Heat Location  Cervical;Shoulder      Electrical Stimulation   Electrical Stimulation Location  cervical and upper trap area    Electrical Stimulation Action  IFC  Electrical Stimulation Parameters  supine    Electrical Stimulation Goals  Pain      Manual Therapy   Manual Therapy  Soft tissue mobilization;Passive ROM;Neural Stretch;Muscle Energy Technique    Soft tissue mobilization  right upper trap, the cervical parapsinals and the upper pecs    Neural Stretch  Ulnar, medial & Radial nerve glide LUE                  PT Long Term Goals - 03/03/20 1031      PT LONG TERM GOAL #1   Title  I with advanced HEP to include taking posture breaks during work ( 12/07/2019)    Status  Achieved      PT LONG TERM GOAL #2   Title  improve upper back strength =/>5-/5 to assist with supporting upright posture ( 12/07/2019)    Status  Partially Met      PT LONG TERM GOAL #3   Title  improve bilat cervical motion =/> 75 degrees with  minimal to no pain ( 12/07/2019)    Status  Achieved            Plan - 03/03/20 1024    Clinical Impression Statement  Patinet reports that she has started walking, reports that she is overall feeling better, still tight in the neck,  shoulder, wrist and chest.  Struggles with work, I am trying to instruct her in the stretches and exercises that she can do at her desk,  She has tightness in the upper trap, pectorals, UE and the cervical area, worse on the right.  She reports that the NCV test was negative    PT Next Visit Plan  work on the chest wall, pectoral area    Consulted and Agree with Plan of Care  Patient       Patient will benefit from skilled therapeutic intervention in order to improve the following deficits and impairments:  Decreased range of motion, Impaired UE functional use, Increased muscle spasms, Pain, Decreased strength, Postural dysfunction  Visit Diagnosis: Muscle weakness (generalized)  Cramp and spasm  Stiffness of right shoulder, not elsewhere classified  Acute pain of right shoulder  Cervicalgia     Problem List Patient Active Problem List   Diagnosis Date Noted  . Closed nondisplaced fracture of proximal phalanx of lesser toe of left foot 11/19/2017  . GI bleed 10/23/2017  . Hypokalemia 10/23/2017  . Nausea vomiting and diarrhea 10/23/2017  . Hematochezia 10/23/2017  . 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 Back pain 04/12/2014  . Arthritis 04/12/2014  . Knee pain 04/12/2014  . Essential hypertension 11/27/2013  . Osteoarthritis of left knee 07/16/2012    Sumner Boast., PT 03/03/2020, 10:32 AM  [facility] [address] [place], [place], [address] Phone: [phone]   Fax:  [phone]  Name: Margaret [name] MRN: [id number] Date of Birth: 06/23/1951

## 2020-03-10 ENCOUNTER — Ambulatory Visit: Payer: Federal, State, Local not specified - PPO | Admitting: Physical Therapy

## 2020-03-10 ENCOUNTER — Other Ambulatory Visit: Payer: Self-pay

## 2020-03-10 ENCOUNTER — Encounter: Payer: Self-pay | Admitting: Physical Therapy

## 2020-03-10 DIAGNOSIS — M542 Cervicalgia: Secondary | ICD-10-CM

## 2020-03-10 DIAGNOSIS — M25511 Pain in right shoulder: Secondary | ICD-10-CM

## 2020-03-10 DIAGNOSIS — M25611 Stiffness of right shoulder, not elsewhere classified: Secondary | ICD-10-CM

## 2020-03-10 DIAGNOSIS — M6281 Muscle weakness (generalized): Secondary | ICD-10-CM

## 2020-03-10 DIAGNOSIS — R252 Cramp and spasm: Secondary | ICD-10-CM

## 2020-03-10 NOTE — Therapy (Signed)
[facility] [address] [place], [place], [address] Phone: [phone]   Fax:  [phone]  Physical Therapy Treatment  Patient Details  Name: Peni Rupard MRN: [id number] Date of Birth: 05/19/1951 Referring Provider (PT): Dr Arther Abbott   Encounter Date: 03/10/2020  PT End of Session - 03/10/20 1110    Visit Number  24    Number of Visits  50    Date for PT Re-Evaluation  03/17/20    Authorization Type  [employer]    PT Start Time  1012    PT Stop Time  1111    PT Time Calculation (min)  59 min    Activity Tolerance  Patient tolerated treatment well    Behavior During Therapy  Anxious;WFL for tasks assessed/performed       Past Medical History:  Diagnosis Date  . Allergy   . Anemia   . Colitis   . Dyspareunia   . Endometriosis   . GERD (gastroesophageal reflux disease)   . High cholesterol   . Hx of abnormal Pap smear 1980's  . Hypertension   . Osteopenia   . Ulcer   . Urinary incontinence     Past Surgical History:  Procedure Laterality Date  . ABDOMINAL HYSTERECTOMY  09/2008   TVH/BSO--Dr. Quincy Simmonds with TVT  . BLADDER SUSPENSION  09/2008   Dr. Quincy Simmonds  . CERVIX LESION DESTRUCTION  1980   hx abnormal pap--dysplasia  . COLONOSCOPY WITH PROPOFOL Left 10/25/2017   Procedure: COLONOSCOPY WITH PROPOFOL;  Surgeon: Ronnette Juniper, MD;  Location: [facility];  Service: Gastroenterology;  Laterality: Left;    There were no vitals filed for this visit.  Subjective Assessment - 03/10/20 1014    Subjective  Still doing better, I was very sore in the chest after the last treatment but I think that was good    Currently in Pain?  Yes    Pain Score  2     Pain Location  Chest    Pain Orientation  Right    Aggravating Factors   reaching                       [facility] Adult PT Treatment/Exercise - 03/10/20 0001      Neck Exercises: Machines for Strengthening   UBE (Upper Arm  Bike)  Level 5 x 6 minutes change direction every minute    Nustep  L4 x 5 min     Cybex Row  20# 2x10    Other Machines for Strengthening  5# pulleys rows and extension with cues for posture, core activation and to relax her shoulder, triceps 20#, biceps 5#      Neck Exercises: Theraband   Shoulder External Rotation  20 reps;Red      Neck Exercises: Standing   Other Standing Exercises  anterior chest and neck stretch with hands behind back and chin toward the cieling, 3# ER back to wall    Other Standing Exercises  doorway stretch, W backs some overpressure      Moist Heat Therapy   Number Minutes Moist Heat  10 Minutes    Moist Heat Location  Cervical;Shoulder      Electrical Stimulation   Electrical Stimulation Location  cervical and upper trap area    Electrical Stimulation Action  IFC    Electrical Stimulation Parameters  supine    Electrical Stimulation Goals  Pain      Manual Therapy  Manual Therapy  Soft tissue mobilization;Passive ROM;Neural Stretch;Muscle Energy Technique    Soft tissue mobilization  right upper trap, the cervical parapsinals and the upper pecs    Passive ROM  right shoulder all motions to end ranges, some manual shoulder depression                  PT Long Term Goals - 03/10/20 1112      PT LONG TERM GOAL #2   Title  improve upper back strength =/>5-/5 to assist with supporting upright posture ( 12/07/2019)    Status  Partially Met      PT LONG TERM GOAL #3   Title  improve bilat cervical motion =/> 75 degrees with  minimal to no pain ( 12/07/2019)    Status  Achieved      PT LONG TERM GOAL #4   Title  report =/> 75% reduction in overall pain of neck and chest with daily activity ( 12/07/2019)    Status  Partially Met            Plan - 03/10/20 1111    Clinical Impression Statement  Patient is moving better, still needs a lot of verbal and tactile cues to decrease the shoulder elevation during exercises.  She is very tight and  tender in the right upper trap and the right chest wall.    PT Next Visit Plan  work on the chest wall, pectoral area    Consulted and Agree with Plan of Care  Patient       Patient will benefit from skilled therapeutic intervention in order to improve the following deficits and impairments:  Decreased range of motion, Impaired UE functional use, Increased muscle spasms, Pain, Decreased strength, Postural dysfunction  Visit Diagnosis: Muscle weakness (generalized)  Cramp and spasm  Stiffness of right shoulder, not elsewhere classified  Acute pain of right shoulder  Cervicalgia     Problem List Patient Active Problem List   Diagnosis Date Noted  . Closed nondisplaced fracture of proximal phalanx of lesser toe of left foot 11/19/2017  . GI bleed 10/23/2017  . Hypokalemia 10/23/2017  . Nausea vomiting and diarrhea 10/23/2017  . Hematochezia 10/23/2017  . 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 Back pain 04/12/2014  . Arthritis 04/12/2014  . Knee pain 04/12/2014  . Essential hypertension 11/27/2013  . Osteoarthritis of left knee 07/16/2012    Sumner Boast., PT 03/10/2020, 11:13 AM  [facility] [address] [place], [place], [address] Phone: [phone]   Fax:  [phone]  Name: Michiah Masse MRN: [id number] Date of Birth: Dec 13, 1950

## 2020-03-29 ENCOUNTER — Ambulatory Visit: Payer: Federal, State, Local not specified - PPO | Attending: Orthopedic Surgery | Admitting: Physical Therapy

## 2020-03-29 ENCOUNTER — Encounter: Payer: Self-pay | Admitting: Physical Therapy

## 2020-03-29 ENCOUNTER — Other Ambulatory Visit: Payer: Self-pay

## 2020-03-29 DIAGNOSIS — M6281 Muscle weakness (generalized): Secondary | ICD-10-CM

## 2020-03-29 DIAGNOSIS — R252 Cramp and spasm: Secondary | ICD-10-CM

## 2020-03-29 DIAGNOSIS — M542 Cervicalgia: Secondary | ICD-10-CM | POA: Diagnosis present

## 2020-03-29 DIAGNOSIS — M25511 Pain in right shoulder: Secondary | ICD-10-CM | POA: Insufficient documentation

## 2020-03-29 DIAGNOSIS — M25611 Stiffness of right shoulder, not elsewhere classified: Secondary | ICD-10-CM | POA: Diagnosis present

## 2020-03-29 NOTE — Therapy (Signed)
[place], [place], [address] Phone: [phone]   Fax:  [phone]  Physical Therapy Treatment  Patient Details  Name: Margaret [name] MRN: [id number] Date of Birth: 1950/12/05 Referring Provider (PT): Dr Arther Abbott   Encounter Date: 03/29/2020  PT End of Session - 03/29/20 1750    Visit Number  25    Number of Visits  50    Date for PT Re-Evaluation  04/29/20    Authorization Type  BCBS    Authorization - Number of Visits  50    PT Start Time  1700    PT Stop Time  1750    PT Time Calculation (min)  50 min    Activity Tolerance  Patient tolerated treatment well    Behavior During Therapy  [facility] for tasks assessed/performed       Past Medical History:  Diagnosis Date  . Allergy   . Anemia   . Colitis   . Dyspareunia   . Endometriosis   . GERD (gastroesophageal reflux disease)   . High cholesterol   . Hx of abnormal Pap smear 1980's  . Hypertension   . Osteopenia   . Ulcer   . Urinary incontinence     Past Surgical History:  Procedure Laterality Date  . ABDOMINAL HYSTERECTOMY  09/2008   TVH/BSO--Dr. Quincy Simmonds with TVT  . BLADDER SUSPENSION  09/2008   Dr. Quincy Simmonds  . CERVIX LESION DESTRUCTION  1980   hx abnormal pap--dysplasia  . COLONOSCOPY WITH PROPOFOL Left 10/25/2017   Procedure: COLONOSCOPY WITH PROPOFOL;  Surgeon: Ronnette Juniper, MD;  Location: [facility];  Service: Gastroenterology;  Laterality: Left;    There were no vitals filed for this visit.  Subjective Assessment - 03/29/20 1702    Subjective  I think I am doing better, I am trying to walk more, I have changed my diet, I am still hurting in the chest and neck area                        [facility] Adult PT Treatment/Exercise - 03/29/20 0001      Neck Exercises: Machines for Strengthening   UBE (Upper Arm Bike)  Level 5 x 6 minutes change direction every minute    Nustep  L5 x 5 min        Neck Exercises: Standing   Other Standing Exercises  anterior chest and neck stretch with hands behind back and chin toward the cieling, 3# ER back to wall    Other Standing Exercises  doorway stretch, W backs some overpressure, gastroc stretch, passive HS, ITB, adductor and ITB  and quad stretches      Manual Therapy   Manual Therapy  Soft tissue mobilization;Passive ROM;Neural Stretch;Muscle Energy Technique    Soft tissue mobilization  right upper trap, the cervical parapsinals and the upper pecs    Passive ROM  right shoulder all motions to end ranges, some manual shoulder depression                  PT Long Term Goals - 03/10/20 1112      PT LONG TERM GOAL #2   Title  improve upper back strength =/>5-/5 to assist with supporting upright posture ( 12/07/2019)    Status  Partially Met      PT LONG TERM GOAL #3   Title  improve bilat cervical motion =/> 75 degrees with  minimal to  no pain ( 12/07/2019)    Status  Achieved      PT LONG TERM GOAL #4   Title  report =/> 75% reduction in overall pain of neck and chest with daily activity ( 12/07/2019)    Status  Partially Met            Plan - 03/29/20 1750    Clinical Impression Statement  I feel like the patient is doing better, she reports that she is changing her eating habits, she reports that she has started walking for exercise at least 2x/week, this has caused a lot of soreness and pain i the legs, she is very tight in the all of the LE mms, I stretched her today and gave her HEP to try to do on her own.  She has knots in the right upper trap and neck area but they are less tender than previously.  She still c/o some chest pain but was able to demo the doorway stretch with better motion and less reports of pain    PT Frequency  1x / week    PT Duration  4 weeks    PT Next Visit Plan  will continue to work with her on getting moving and becoming more healthy as well as address her pain and limiations     Consulted and Agree with Plan of Care  Patient       Patient will benefit from skilled therapeutic intervention in order to improve the following deficits and impairments:  Decreased range of motion, Impaired UE functional use, Increased muscle spasms, Pain, Decreased strength, Postural dysfunction  Visit Diagnosis: Muscle weakness (generalized) - Plan: PT plan of care cert/re-cert  Cramp and spasm - Plan: PT plan of care cert/re-cert  Stiffness of right shoulder, not elsewhere classified - Plan: PT plan of care cert/re-cert  Acute pain of right shoulder - Plan: PT plan of care cert/re-cert  Cervicalgia - Plan: PT plan of care cert/re-cert     Problem List Patient Active Problem List   Diagnosis Date Noted  . Closed nondisplaced fracture of proximal phalanx of lesser toe of left foot 11/19/2017  . GI bleed 10/23/2017  . Hypokalemia 10/23/2017  . Nausea vomiting and diarrhea 10/23/2017  . Hematochezia 10/23/2017  . 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 Back pain 04/12/2014  . Arthritis 04/12/2014  . Knee pain 04/12/2014  . Essential hypertension 11/27/2013  . Osteoarthritis of left knee 07/16/2012    Sumner Boast., PT 03/29/2020, 6:02 PM  [place] [address] [address] [place], [place], [address] Phone: [phone]   Fax:  [phone]  Name: Margaret [name] MRN: [id number] Date of Birth: Jul 24, 1951

## 2020-03-29 NOTE — Patient Instructions (Signed)
Access Code: [email or web address] URL: [email or web address] Date: 03/29/2020 Prepared by: Lum Babe  Exercises Supine Hamstring Stretch with Strap - 1 x daily - 7 x weekly - 2 sets - 5 reps - 30 hold Supine Piriformis Stretch Pulling Heel to Hip - 1 x daily - 7 x weekly - 2 sets - 5 reps - 30 hold Supine ITB Stretch with Strap - 1 x daily - 7 x weekly - 2 sets - 5 reps - 30 hold Supine Quadriceps Stretch with Strap on Table - 1 x daily - 7 x weekly - 2 sets - 5 reps - 30 hold Standing Bilateral Gastroc Stretch with Step - 1 x daily - 7 x weekly - 2 sets - 5 reps - 30 hold

## 2020-04-05 ENCOUNTER — Other Ambulatory Visit: Payer: Self-pay

## 2020-04-05 ENCOUNTER — Ambulatory Visit: Payer: Federal, State, Local not specified - PPO | Admitting: Physical Therapy

## 2020-04-05 ENCOUNTER — Encounter: Payer: Self-pay | Admitting: Physical Therapy

## 2020-04-05 DIAGNOSIS — M25511 Pain in right shoulder: Secondary | ICD-10-CM

## 2020-04-05 DIAGNOSIS — M25611 Stiffness of right shoulder, not elsewhere classified: Secondary | ICD-10-CM

## 2020-04-05 DIAGNOSIS — M6281 Muscle weakness (generalized): Secondary | ICD-10-CM | POA: Diagnosis not present

## 2020-04-05 DIAGNOSIS — R252 Cramp and spasm: Secondary | ICD-10-CM

## 2020-04-05 DIAGNOSIS — M542 Cervicalgia: Secondary | ICD-10-CM

## 2020-04-05 NOTE — Therapy (Signed)
[place], [place], [address] Phone: [phone]   Fax:  [phone]  Physical Therapy Treatment  Patient Details  Name: Margaret [name] MRN: [id number] Date of Birth: [birth year] Referring Provider (PT): Dr Arther Abbott   Encounter Date: 04/05/2020  PT End of Session - 04/05/20 1605    Visit Number  26    Number of Visits  50    Date for PT Re-Evaluation  04/29/20    Authorization Type  BCBS    PT Start Time  1530    PT Stop Time  1616    PT Time Calculation (min)  46 min    Activity Tolerance  Patient tolerated treatment well    Behavior During Therapy  [facility] for tasks assessed/performed       Past Medical History:  Diagnosis Date  . Allergy   . Anemia   . Colitis   . Dyspareunia   . Endometriosis   . GERD (gastroesophageal reflux disease)   . High cholesterol   . Hx of abnormal Pap smear 1980's  . Hypertension   . Osteopenia   . Ulcer   . Urinary incontinence     Past Surgical History:  Procedure Laterality Date  . ABDOMINAL HYSTERECTOMY  09/2008   TVH/BSO--Dr. Quincy Simmonds with TVT  . BLADDER SUSPENSION  09/2008   Dr. Quincy Simmonds  . CERVIX LESION DESTRUCTION  1980   hx abnormal pap--dysplasia  . COLONOSCOPY WITH PROPOFOL Left 10/25/2017   Procedure: COLONOSCOPY WITH PROPOFOL;  Surgeon: Ronnette Juniper, MD;  [facility];  Service: Gastroenterology;  Laterality: Left;    There were no vitals filed for this visit.  Subjective Assessment - 04/05/20 1539    Subjective  I am having the chest mm tightness again, calf tightness, I am able to do more    Currently in Pain?  Yes    Pain Location  Chest    Pain Descriptors / Indicators  Sore;Spasm    Aggravating Factors   actviity                        [facility] Adult PT Treatment/Exercise - 04/05/20 0001      Neck Exercises: Machines for Strengthening   Nustep  L5 x 6 min     Cybex Row  20# 2x10     Lat Pull  20# 2x10    Other Machines for Strengthening  5# hip extension 2x10    Other Machines for Strengthening  5# pulleys rows and extension with cues for posture, core activation and to relax her shoulder, triceps 20#, biceps 5#      Neck Exercises: Standing   Other Standing Exercises  anterior chest and neck stretch with hands behind back and chin toward the cieling, 3# ER back to wall    Other Standing Exercises  doorway stretch, W backs some overpressure, gastroc stretch, passive HS, ITB, adductor and ITB  and quad stretches      Manual Therapy   Manual Therapy  Soft tissue mobilization;Passive ROM;Neural Stretch;Muscle Energy Technique    Soft tissue mobilization  right upper trap, the cervical parapsinals and the upper pecs    Passive ROM  right shoulder all motions to end ranges, some manual shoulder depression    Manual Traction  occipital release, some manual stretch of the cervical spine with shoulder depression, some upper trap and levator stretch  PT Long Term Goals - 04/05/20 1607      PT LONG TERM GOAL #4   Title  report =/> 75% reduction in overall pain of neck and chest with daily activity ( 12/07/2019)    Status  Partially Met            Plan - 04/05/20 1606    Clinical Impression Statement  Patietn reports that she is sore and tired, she reports that she has been doing some cleaning and yardwork, she reports that she has not been able to do this and is now feeling well enough to try and is pleased that she can but also having some pain    PT Next Visit Plan  will continue to work with her on getting moving and becoming more healthy as well as address her pain and limiations    Consulted and Agree with Plan of Care  Patient       Patient will benefit from skilled therapeutic intervention in order to improve the following deficits and impairments:  Decreased range of motion, Impaired UE functional use, Increased muscle spasms, Pain,  Decreased strength, Postural dysfunction  Visit Diagnosis: Muscle weakness (generalized)  Cramp and spasm  Stiffness of right shoulder, not elsewhere classified  Acute pain of right shoulder  Cervicalgia     Problem List Patient Active Problem List   Diagnosis Date Noted  . Closed nondisplaced fracture of proximal phalanx of lesser toe of left foot 11/19/2017  . GI bleed 10/23/2017  . Hypokalemia 10/23/2017  . Nausea vomiting and diarrhea 10/23/2017  . Hematochezia 10/23/2017  . 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 Back pain 04/12/2014  . Arthritis 04/12/2014  . Knee pain 04/12/2014  . Essential hypertension 11/27/2013  . Osteoarthritis of left knee 07/16/2012    Sumner Boast., PT 04/05/2020, 4:08 PM  [place] [address] [place], [place], [address] Phone: [phone]   Fax:  [phone]  Name: Margaret [name] MRN: [id number] Date of Birth: 1951/10/21

## 2020-04-12 ENCOUNTER — Ambulatory Visit: Payer: Federal, State, Local not specified - PPO | Attending: Orthopedic Surgery | Admitting: Physical Therapy

## 2020-04-12 ENCOUNTER — Other Ambulatory Visit: Payer: Self-pay

## 2020-04-12 ENCOUNTER — Encounter: Payer: Self-pay | Admitting: Physical Therapy

## 2020-04-12 DIAGNOSIS — M25611 Stiffness of right shoulder, not elsewhere classified: Secondary | ICD-10-CM | POA: Insufficient documentation

## 2020-04-12 DIAGNOSIS — R252 Cramp and spasm: Secondary | ICD-10-CM | POA: Diagnosis present

## 2020-04-12 DIAGNOSIS — M542 Cervicalgia: Secondary | ICD-10-CM

## 2020-04-12 DIAGNOSIS — M25511 Pain in right shoulder: Secondary | ICD-10-CM | POA: Diagnosis present

## 2020-04-12 DIAGNOSIS — M6281 Muscle weakness (generalized): Secondary | ICD-10-CM | POA: Diagnosis not present

## 2020-04-12 NOTE — Therapy (Signed)
[place], [place], [address] Phone: [phone]   Fax:  [phone]  Physical Therapy Treatment  Patient Details  Name: Margaret [name] MRN: [id number] Date of Birth: [birth year] Referring Provider (PT): Dr Arther Abbott   Encounter Date: 04/12/2020  PT End of Session - 04/12/20 1754    Visit Number  27    Date for PT Re-Evaluation  04/29/20    Authorization Type  BCBS    PT Start Time  1612    PT Stop Time  1700    PT Time Calculation (min)  48 min    Activity Tolerance  Patient tolerated treatment well    Behavior During Therapy  [facility] for tasks assessed/performed       Past Medical History:  Diagnosis Date  . Allergy   . Anemia   . Colitis   . Dyspareunia   . Endometriosis   . GERD (gastroesophageal reflux disease)   . High cholesterol   . Hx of abnormal Pap smear 1980's  . Hypertension   . Osteopenia   . Ulcer   . Urinary incontinence     Past Surgical History:  Procedure Laterality Date  . ABDOMINAL HYSTERECTOMY  09/2008   TVH/BSO--Dr. Quincy Simmonds with TVT  . BLADDER SUSPENSION  09/2008   Dr. Quincy Simmonds  . CERVIX LESION DESTRUCTION  1980   hx abnormal pap--dysplasia  . COLONOSCOPY WITH PROPOFOL Left 10/25/2017   Procedure: COLONOSCOPY WITH PROPOFOL;  Surgeon: Ronnette Juniper, MD;  [facility]: [facility];  Service: Gastroenterology;  Laterality: Left;    There were no vitals filed for this visit.  Subjective Assessment - 04/12/20 1715    Subjective  Patient reports that she has been doing some yardwork and is very pleased that she was able to tolerate it, reports " there is no way I could have done this, I am moving better, but boy I am sore and hurting"    Currently in Pain?  Yes    Pain Score  3     Pain Location  Neck    Pain Orientation  Right    Pain Descriptors / Indicators  Sore    Aggravating Factors   yardwork                        [facility]  Adult PT Treatment/Exercise - 04/12/20 0001      Neck Exercises: Machines for Strengthening   Nustep  L5 x 6 min     Cybex Row  20# 2x10    Cybex Chest Press  5# 2x10    Lat Pull  20# 2x10    Other Machines for Strengthening  10# pulleys rows and extension with cues for posture, core activation and to relax her shoulder, triceps 20#, biceps 5#      Neck Exercises: Standing   Other Standing Exercises  anterior chest and neck stretch with hands behind back and chin toward the cieling, 3# ER back to wall    Other Standing Exercises  doorway stretch, W backs some overpressure, gastroc stretch, passive HS, ITB, adductor and ITB  and quad stretches      Manual Therapy   Manual Therapy  Soft tissue mobilization;Passive ROM;Neural Stretch;Muscle Energy Technique    Soft tissue mobilization  right upper trap, the cervical parapsinals and the upper pecs    Passive ROM  right shoulder all motions to end ranges, some manual shoulder depression  Neural Stretch  Ulnar, medial & Radial nerve glide LUE                  PT Long Term Goals - 04/12/20 1758      PT LONG TERM GOAL #2   Title  improve upper back strength =/>5-/5 to assist with supporting upright posture ( 12/07/2019)    Status  Partially Met      PT LONG TERM GOAL #3   Title  improve bilat cervical motion =/> 75 degrees with  minimal to no pain ( 12/07/2019)    Status  Achieved            Plan - 04/12/20 1754    Clinical Impression Statement  Patient is able to tolerate more activites and yardwork at her home, she is reporting overall less c/o pain and difficulty with work and ADL's.  She remains tight with spasms in the upper trap and neck, she is tight anterior chest and median nerve.  HEr cervical ROM is improving to only minor limitaitons    PT Next Visit Plan  will continue to work with her on getting moving and becoming more healthy as well as address her pain and limiations    Consulted and Agree with Plan of  Care  Patient       Patient will benefit from skilled therapeutic intervention in order to improve the following deficits and impairments:  Decreased range of motion, Impaired UE functional use, Increased muscle spasms, Pain, Decreased strength, Postural dysfunction  Visit Diagnosis: Muscle weakness (generalized)  Cramp and spasm  Stiffness of right shoulder, not elsewhere classified  Acute pain of right shoulder  Cervicalgia     Problem List Patient Active Problem List   Diagnosis Date Noted  . Closed nondisplaced fracture of proximal phalanx of lesser toe of left foot 11/19/2017  . GI bleed 10/23/2017  . Hypokalemia 10/23/2017  . Nausea vomiting and diarrhea 10/23/2017  . Hematochezia 10/23/2017  . 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 Back pain 04/12/2014  . Arthritis 04/12/2014  . Knee pain 04/12/2014  . Essential hypertension 11/27/2013  . Osteoarthritis of left knee 07/16/2012    Sumner Boast., PT 04/12/2020, 5:59 PM  [place] [address] [place], [place], [address] Phone: [phone]   Fax:  [phone]  Name: Margaret [name] MRN: [id number] Date of Birth: November 14, 1950

## 2020-04-14 ENCOUNTER — Encounter: Payer: Federal, State, Local not specified - PPO | Admitting: Physical Therapy

## 2020-04-18 ENCOUNTER — Other Ambulatory Visit: Payer: Self-pay

## 2020-04-18 ENCOUNTER — Ambulatory Visit: Payer: Federal, State, Local not specified - PPO | Admitting: Physical Therapy

## 2020-04-18 ENCOUNTER — Encounter: Payer: Self-pay | Admitting: Physical Therapy

## 2020-04-18 DIAGNOSIS — M25611 Stiffness of right shoulder, not elsewhere classified: Secondary | ICD-10-CM

## 2020-04-18 DIAGNOSIS — M6281 Muscle weakness (generalized): Secondary | ICD-10-CM

## 2020-04-18 DIAGNOSIS — M25511 Pain in right shoulder: Secondary | ICD-10-CM

## 2020-04-18 DIAGNOSIS — R252 Cramp and spasm: Secondary | ICD-10-CM

## 2020-04-18 DIAGNOSIS — M542 Cervicalgia: Secondary | ICD-10-CM

## 2020-04-18 NOTE — Therapy (Signed)
[place], [place], [address] Phone: [phone]   Fax:  [phone]  Physical Therapy Treatment  Patient Details  Name: Margaret [name] MRN: [id number] Date of Birth: [birth year] Referring Provider (PT): Dr Arther Abbott   Encounter Date: 04/18/2020  PT End of Session - 04/18/20 1724    Visit Number  28    Number of Visits  60    PT Start Time  0037    PT Stop Time  1739    PT Time Calculation (min)  44 min    Activity Tolerance  Patient tolerated treatment well    Behavior During Therapy  [facility] for tasks assessed/performed       Past Medical History:  Diagnosis Date  . Allergy   . Anemia   . Colitis   . Dyspareunia   . Endometriosis   . GERD (gastroesophageal reflux disease)   . High cholesterol   . Hx of abnormal Pap smear 1980's  . Hypertension   . Osteopenia   . Ulcer   . Urinary incontinence     Past Surgical History:  Procedure Laterality Date  . ABDOMINAL HYSTERECTOMY  09/2008   TVH/BSO--Dr. Quincy Simmonds with TVT  . BLADDER SUSPENSION  09/2008   Dr. Quincy Simmonds  . CERVIX LESION DESTRUCTION  1980   hx abnormal pap--dysplasia  . COLONOSCOPY WITH PROPOFOL Left 10/25/2017   Procedure: COLONOSCOPY WITH PROPOFOL;  Surgeon: Ronnette Juniper, MD;  [facility];  Service: Gastroenterology;  Laterality: Left;    There were no vitals filed for this visit.  Subjective Assessment - 04/18/20 1722    Subjective  Patient comes in today and she is again frustrated, she reports that her chest hurts, she reports tha tshe will be seeing a cariologist on Friday    Currently in Pain?  Yes    Pain Score  5     Pain Location  Chest    Aggravating Factors   reports unsure                        [facility] Adult PT Treatment/Exercise - 04/18/20 0001      Moist Heat Therapy   Number Minutes Moist Heat  10 Minutes    Moist Heat Location  Cervical;Shoulder       Electrical Stimulation   Electrical Stimulation Location  cervical and upper trap area    Electrical Stimulation Action  IFC    Electrical Stimulation Parameters  supine    Electrical Stimulation Goals  Pain      Manual Therapy   Manual Therapy  Soft tissue mobilization;Passive ROM;Neural Stretch;Muscle Energy Technique    Manual therapy comments  some gentle pressure on the ribs with deep breathing, she is very stiff in the ribs and the thoracic vertebrae, gentle thoracic extension and rotation    Soft tissue mobilization  right upper trap, the cervical parapsinals and the upper pecs    Passive ROM  right shoulder all motions to end ranges, some manual shoulder depression    Manual Traction  occipital release, some manual stretch of the cervical spine with shoulder depression, some upper trap and levator stretch    Neural Stretch  Ulnar, medial & Radial nerve glide LUE                  PT Long Term Goals - 04/18/20 1727      PT LONG TERM GOAL #1  Title  I with advanced HEP to include taking posture breaks during work ( 12/07/2019)    Status  Achieved      PT LONG TERM GOAL #2   Title  improve upper back strength =/>5-/5 to assist with supporting upright posture ( 12/07/2019)    Status  Not Met      PT LONG TERM GOAL #3   Title  improve bilat cervical motion =/> 75 degrees with  minimal to no pain ( 12/07/2019)    Status  Achieved      PT LONG TERM GOAL #4   Title  report =/> 75% reduction in overall pain of neck and chest with daily activity ( 12/07/2019)    Status  Not Met      PT LONG TERM GOAL #5   Title  Pt will be able to walk for fitness 20 min at a time without increasing pain.     Status  Achieved            Plan - 04/18/20 1725    Clinical Impression Statement  Patient is again frustrated that her chest is hurting and her neck is hurting, she report sthat she is grateful for what we have done but she wants to "get to the bottom" of why she is hurting.   She is going to see a cardiologis and a gastroenterologist soon to see if anything is going on, she reports anxiety due to familial history of stroke.    PT Next Visit Plan  we talked about her doing HEP and staying active with walking and stretching and watching her posture.  I will d/c as we have reached a plateau    Consulted and Agree with Plan of Care  Patient       Patient will benefit from skilled therapeutic intervention in order to improve the following deficits and impairments:  Decreased range of motion, Impaired UE functional use, Increased muscle spasms, Pain, Decreased strength, Postural dysfunction  Visit Diagnosis: Muscle weakness (generalized)  Cramp and spasm  Stiffness of right shoulder, not elsewhere classified  Acute pain of right shoulder  Cervicalgia     Problem List Patient Active Problem List   Diagnosis Date Noted  . Closed nondisplaced fracture of proximal phalanx of lesser toe of left foot 11/19/2017  . GI bleed 10/23/2017  . Hypokalemia 10/23/2017  . Nausea vomiting and diarrhea 10/23/2017  . Hematochezia 10/23/2017  . 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 Back pain 04/12/2014  . Arthritis 04/12/2014  . Knee pain 04/12/2014  . Essential hypertension 11/27/2013  . Osteoarthritis of left knee 07/16/2012    Sumner Boast., PT 04/18/2020, 5:28 PM  [place] [address] [place], [place], [address] Phone: [phone]   Fax:  [phone]  Name: Charelle Petrakis MRN: [id number] Date of Birth: 1951/03/17

## 2020-04-21 ENCOUNTER — Other Ambulatory Visit: Payer: Self-pay

## 2020-04-21 ENCOUNTER — Ambulatory Visit: Payer: Federal, State, Local not specified - PPO | Admitting: Cardiology

## 2020-04-21 ENCOUNTER — Encounter: Payer: Self-pay | Admitting: Cardiology

## 2020-04-21 VITALS — BP 119/73 | HR 78 | Ht 60.0 in | Wt 169.0 lb

## 2020-04-21 DIAGNOSIS — R0789 Other chest pain: Secondary | ICD-10-CM | POA: Insufficient documentation
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
## 2020-04-24 ENCOUNTER — Telehealth: Payer: Self-pay | Admitting: Orthopedic Surgery

## 2020-04-24 NOTE — Telephone Encounter (Signed)
Recommend 14 day event monitor and CTA.If both tests normal, no further cardiac workup is required.  Thanks MJP

## 2020-04-24 NOTE — Telephone Encounter (Signed)
From pt

## 2020-04-24 NOTE — Telephone Encounter (Signed)
Patient called today asking if she should make an appointment to come into the office or if Dr Aline Brochure could call her and go over her nerve tests with her.  She said she has been going to therapy but now doesn't know what else to do.  I could not find any nerve test results in her chart.  She said that Dr. Aline Brochure is not the physician that sent her for the test.  Roselynne will call that office and have them send the results over for Dr. Aline Brochure to review and see what her next step should be.

## 2020-04-25 ENCOUNTER — Ambulatory Visit: Payer: Federal, State, Local not specified - PPO | Admitting: Physical Therapy

## 2020-04-25 NOTE — Telephone Encounter (Signed)
Called pt to inform her about her monitor and CTA results. Pt understood

## 2020-05-22 ENCOUNTER — Other Ambulatory Visit: Payer: Self-pay

## 2020-05-22 ENCOUNTER — Emergency Department (HOSPITAL_BASED_OUTPATIENT_CLINIC_OR_DEPARTMENT_OTHER): Payer: Federal, State, Local not specified - PPO

## 2020-05-22 ENCOUNTER — Encounter (HOSPITAL_BASED_OUTPATIENT_CLINIC_OR_DEPARTMENT_OTHER): Payer: Self-pay | Admitting: Emergency Medicine

## 2020-05-22 ENCOUNTER — Emergency Department (HOSPITAL_BASED_OUTPATIENT_CLINIC_OR_DEPARTMENT_OTHER)
Admission: EM | Admit: 2020-05-22 | Discharge: 2020-05-22 | Disposition: A | Discharge status: Home / Self Care | Payer: Federal, State, Local not specified - PPO | Attending: Emergency Medicine | Admitting: Emergency Medicine

## 2020-05-22 DIAGNOSIS — R05 Cough: Secondary | ICD-10-CM | POA: Diagnosis not present

## 2020-05-22 DIAGNOSIS — Z79899 Other long term (current) drug therapy: Secondary | ICD-10-CM | POA: Diagnosis not present

## 2020-05-22 DIAGNOSIS — I10 Essential (primary) hypertension: Secondary | ICD-10-CM | POA: Insufficient documentation

## 2020-05-22 DIAGNOSIS — R0789 Other chest pain: Secondary | ICD-10-CM

## 2020-05-22 DIAGNOSIS — R0602 Shortness of breath: Secondary | ICD-10-CM | POA: Diagnosis not present

## 2020-05-22 DIAGNOSIS — R079 Chest pain, unspecified: Secondary | ICD-10-CM | POA: Diagnosis present

## 2020-05-22 LAB — BASIC METABOLIC PANEL
Anion gap: 10 (ref 5–15)
BUN: 11 mg/dL (ref 8–23)
CO2: 30 mmol/L (ref 22–32)
Calcium: 9.2 mg/dL (ref 8.9–10.3)
Chloride: 100 mmol/L (ref 98–111)
Creatinine, Ser: 0.71 mg/dL (ref 0.44–1.00)
GFR calc Af Amer: >60 mL/min (ref >60)
GFR calc non Af Amer: >60 mL/min (ref >60)
Glucose, Bld: 106 mg/dL — ABNORMAL HIGH (ref 70–99)
Potassium: 3.6 mmol/L (ref 3.5–5.1)
Sodium: 140 mmol/L (ref 135–145)

## 2020-05-22 LAB — CBC
HCT: 36.6 % (ref 36.0–46.0)
Hemoglobin: 11.2 g/dL — ABNORMAL LOW (ref 12.0–15.0)
MCH: 26.2 pg (ref 26.0–34.0)
MCHC: 30.6 g/dL (ref 30.0–36.0)
MCV: 85.5 fL (ref 80.0–100.0)
Platelets: 259 10*3/uL (ref 150–400)
RBC: 4.28 MIL/uL (ref 3.87–5.11)
RDW: 14.8 % (ref 11.5–15.5)
WBC: 5.3 10*3/uL (ref 4.0–10.5)
nRBC: 0 % (ref 0.0–0.2)

## 2020-05-22 LAB — TROPONIN I (HIGH SENSITIVITY)
Troponin I (High Sensitivity): 3 ng/L (ref <18)
Troponin I (High Sensitivity): 4 ng/L (ref <18)

## 2020-05-22 MED ORDER — LORAZEPAM 2 MG/ML IJ SOLN
0.2500 mg | Freq: Once | INTRAMUSCULAR | Status: AC
  Administered 2020-05-22: 0.25 mg via INTRAVENOUS
  Filled 2020-05-22: qty 1

## 2020-05-22 MED ORDER — NAPROXEN 375 MG PO TABS: 375.0000 mg | ORAL_TABLET | Freq: Two times a day (BID) | ORAL | 0 refills | Status: AC

## 2020-05-22 NOTE — ED Triage Notes (Signed)
Intermittent chest heaviness for "awhile". She has seen her PCP and cardiologist and states she was told it is not her heart. Also reports intermittent tingling in her legs.
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
## 2020-05-22 NOTE — Discharge Instructions (Signed)
You may alternate taking Tylenol and Naproxen as needed for pain control. You may take Naproxen twice daily as directed on your discharge paperwork and you may take  [phone] mg of Tylenol every 6 hours. Do not exceed 4000 mg of Tylenol daily as this can lead to liver damage. Also, make sure to take Naproxen with meals as it can cause an upset stomach. Do not take other NSAIDs while taking Naproxen such as (Aleve, Ibuprofen, Aspirin, Celebrex, etc) and do not take more than the prescribed dose as this can lead to ulcers and bleeding in your GI tract. You may use warm and cold compresses to help with your symptoms.   Please follow up with your primary doctor within the next 7-10 days for re-evaluation and further treatment of your symptoms.   Please return to the ER sooner if you have any new or worsening symptoms.

## 2020-05-23 ENCOUNTER — Telehealth: Payer: Self-pay | Admitting: Family Medicine

## 2020-05-23 NOTE — Telephone Encounter (Signed)
OK 

## 2020-05-23 NOTE — Telephone Encounter (Signed)
Caller Lorette Peterkin  Call Back # [phone]  Patient referred to you by Misty,RN downstairs in ED. Please Advise if willing to accept patient at this time.

## 2020-05-25 ENCOUNTER — Encounter: Payer: Self-pay | Admitting: Physical Therapy

## 2020-06-06 ENCOUNTER — Other Ambulatory Visit: Payer: Self-pay

## 2020-06-06 ENCOUNTER — Encounter: Payer: Self-pay | Admitting: Family Medicine

## 2020-06-06 ENCOUNTER — Ambulatory Visit: Payer: Federal, State, Local not specified - PPO | Admitting: Family Medicine

## 2020-06-06 VITALS — BP 108/62 | HR 79 | Temp 98.4°F | Ht 66.0 in | Wt 169.0 lb

## 2020-06-06 DIAGNOSIS — R0789 Other chest pain: Secondary | ICD-10-CM | POA: Diagnosis not present

## 2020-06-06 DIAGNOSIS — K219 Gastro-esophageal reflux disease without esophagitis: Secondary | ICD-10-CM | POA: Diagnosis not present

## 2020-06-06 MED ORDER — PANTOPRAZOLE SODIUM 40 MG PO TBEC: 40.0000 mg | DELAYED_RELEASE_TABLET | Freq: Every day | ORAL | 3 refills | Status: DC

## 2020-06-06 NOTE — Patient Instructions (Addendum)
Ice/cold pack over area for 10-15 min twice daily.  Heat (pad or rice pillow in microwave) over affected area, 10-15 minutes twice daily.   If you aren't any better in the next 7-10 days, let me know. I will set you up with a sports med specialist.   Let us know if you need anything.   Pectoralis Major Rehab Ask your health care provider which exercises are safe for you. Do exercises exactly as told by your health care provider and adjust them as directed. It is normal to feel mild stretching, pulling, tightness, or discomfort as you do these exercises, but you should stop right away if you feel sudden pain or your pain gets worse.Do not begin these exercises until told by your health care provider. Stretching and range of motion exercises These exercises warm up your muscles and joints and improve the movement and flexibility of your shoulder. These exercises can also help to relieve pain, numbness, and tingling. Exercise A: Pendulum  1. Stand near a wall or a surface that you can hold onto for balance. 2. Bend at the waist and let your left / right arm hang straight down. Use your other arm to keep your balance. 3. Relax your arm and shoulder muscles, and move your hips and your trunk so your left / right arm swings freely. Your arm should swing because of the motion of your body, not because you are using your arm or shoulder muscles. 4. Keep moving so your arm swings in the following directions, as told by your health care provider: ? Side to side. ? Forward and backward. ? In clockwise and counterclockwise circles. 5. Slowly return to the starting position. Repeat 2 times. Complete this exercise 3 times per week. Exercise B: Abduction, standing 1. Stand and hold a broomstick, a cane, or a similar object. Place your hands a little more than shoulder-width apart on the object. Your left / right hand should be palm-up, and your other hand should be palm-down. 2. While keeping your elbow  straight and your shoulder muscles relaxed, push the stick across your body toward your left / right side. Raise your left / right arm to the side of your body and then over your head until you feel a stretch in your shoulder. ? Stop when you reach the angle that is recommended by your health care provider. ? Avoid shrugging your shoulder while you raise your arm. Keep your shoulder blade tucked down toward the middle of your spine. 3. Hold for 10 seconds. 4. Slowly return to the starting position. Repeat 2 times. Complete this exercise 3 times per week. Exercise C: Wand flexion, supine  1. Lie on your back. You may bend your knees for comfort. 2. Hold a broomstick, a cane, or a similar object so that your hands are about shoulder-width apart on the object. Your palms should face toward your feet. 3. Raise your left / right arm in front of your face, then behind your head (toward the floor). Use your other hand to help you do this. Stop when you feel a gentle stretch in your shoulder, or when you reach the angle that is recommended by your health care provider. 4. Hold for 3 seconds. 5. Use the broomstick and your other arm to help you return your left / right arm to the starting position. Repeat 2 times. Complete this exercise 3 times per week. Exercise D: Wand shoulder external rotation 1. Stand and hold a broomstick, a cane, or  a similar object so your handsare about shoulder-width apart on the object. 2. Start with your arms hanging down, then bend both elbows to an "L" shape (90 degrees). 3. Keep your left / right elbow at your side. Use your other hand to push the stick so your left / right forearm moves away from your body, out to your side. ? Keep your left / right elbow bent to 90 degrees and keep it against your side. ? Stop when you feel a gentle stretch in your shoulder, or when you reach the angle recommended by your health care provider. 4. Hold for 10 seconds. 5. Use the stick to  help you return your left / right arm to the starting position. Repeat 2 times. Complete this exercise 3 times per week. Strengthening exercises These exercises build strength and endurance in your shoulder. Endurance is the ability to use your muscles for a long time, even after your muscles get tired. Exercise E: Scapular protraction, standing 1. Stand so you are facing a wall. Place your feet about one arm-length away from the wall. 2. Place your hands on the wall and straighten your elbows. 3. Keep your hands on the wall as you push your upper back away from the wall. You should feel your shoulder blades sliding forward.Keep your elbows and your head still. ? If you are not sure that you are doing this exercise correctly, ask your health care provider for more instructions. 4. Hold for 3 seconds. 5. Slowly return to the starting position. Let your muscles relax completely before you repeat this exercise. Repeat 2 times. Complete this exercise 3 times per week. Exercise F: Shoulder blade squeezes  (scapular retraction) 1. Sit with good posture in a stable chair. Do not let your back touch the back of the chair. 2. Your arms should be at your sides with your elbows bent. You may rest your forearms on a pillow if that is more comfortable. 3. Squeeze your shoulder blades together. Bring them down and back. ? Keep your shoulders level. ? Do not lift your shoulders up toward your ears. 4. Hold for 3 seconds. 5. Return to the starting position. Repeat 2 times. Complete this exercise 3 times per week. This information is not intended to replace advice given to you by your health care provider. Make sure you discuss any questions you have with your health care provider. Document Released: 10/28/2005 Document Revised: 08/08/2016 Document Reviewed: 07/16/2015 Elsevier Interactive Patient Education  2018 [place] (ROM) AND STRETCHING EXERCISES  These exercises  may help you when beginning to rehabilitate your issue. In order to successfully resolve your symptoms, you must improve your posture. These exercises are designed to help reduce the forward-head and rounded-shoulder posture which contributes to this condition. Your symptoms may resolve with or without further involvement from your physician, physical therapist or athletic trainer. While completing these exercises, remember:   Restoring tissue flexibility helps normal motion to return to the joints. This allows healthier, less painful movement and activity.  An effective stretch should be held for at least 20 seconds, although you may need to begin with shorter hold times for comfort.  A stretch should never be painful. You should only feel a gentle lengthening or release in the stretched tissue.  Do not do any stretch or exercise that you cannot tolerate.  STRETCH- Axial Extensors  Lie on your back on the floor. You may bend your knees for comfort. Place a rolled-up  hand towel or dish towel, about 2 inches in diameter, under the part of your head that makes contact with the floor.  Gently tuck your chin, as if trying to make a "double chin," until you feel a gentle stretch at the base of your head.  Hold 15-20 seconds. Repeat 2-3 times. Complete this exercise 1 time per day.   STRETCH - Axial Extension   Stand or sit on a firm surface. Assume a good posture: chest up, shoulders drawn back, abdominal muscles slightly tense, knees unlocked (if standing) and feet hip width apart.  Slowly retract your chin so your head slides back and your chin slightly lowers. Continue to look straight ahead.  You should feel a gentle stretch in the back of your head. Be certain not to feel an aggressive stretch since this can cause headaches later.  Hold for 15-20 seconds. Repeat 2-3 times. Complete this exercise 1 time per day.  STRETCH - Cervical Side Bend   Stand or sit on a firm surface. Assume a  good posture: chest up, shoulders drawn back, abdominal muscles slightly tense, knees unlocked (if standing) and feet hip width apart.  Without letting your nose or shoulders move, slowly tip your right / left ear to your shoulder until your feel a gentle stretch in the muscles on the opposite side of your neck.  Hold 15-20 seconds. Repeat 2-3 times. Complete this exercise 1-2 times per day.  STRETCH - Cervical Rotators   Stand or sit on a firm surface. Assume a good posture: chest up, shoulders drawn back, abdominal muscles slightly tense, knees unlocked (if standing) and feet hip width apart.  Keeping your eyes level with the ground, slowly turn your head until you feel a gentle stretch along the back and opposite side of your neck.  Hold 15-20 seconds. Repeat 2-3 times. Complete this exercise 1-2 times per day.  RANGE OF MOTION - Neck Circles   Stand or sit on a firm surface. Assume a good posture: chest up, shoulders drawn back, abdominal muscles slightly tense, knees unlocked (if standing) and feet hip width apart.  Gently roll your head down and around from the back of one shoulder to the back of the other. The motion should never be forced or painful.  Repeat the motion 10-20 times, or until you feel the neck muscles relax and loosen. Repeat 2-3 times. Complete the exercise 1-2 times per day. STRENGTHENING EXERCISES - Cervical Strain and Sprain These exercises may help you when beginning to rehabilitate your injury. They may resolve your symptoms with or without further involvement from your physician, physical therapist, or athletic trainer. While completing these exercises, remember:   Muscles can gain both the endurance and the strength needed for everyday activities through controlled exercises.  Complete these exercises as instructed by your physician, physical therapist, or athletic trainer. Progress the resistance and repetitions only as guided.  You may experience muscle  soreness or fatigue, but the pain or discomfort you are trying to eliminate should never worsen during these exercises. If this pain does worsen, stop and make certain you are following the directions exactly. If the pain is still present after adjustments, discontinue the exercise until you can discuss the trouble with your clinician.  STRENGTH - Cervical Flexors, Isometric  Face a wall, standing about 6 inches away. Place a small pillow, a ball about 6-8 inches in diameter, or a folded towel between your forehead and the wall.  Slightly tuck your chin and gently push  your forehead into the soft object. Push only with mild to moderate intensity, building up tension gradually. Keep your jaw and forehead relaxed.  Hold 10 to 20 seconds. Keep your breathing relaxed.  Release the tension slowly. Relax your neck muscles completely before you start the next repetition. Repeat 2-3 times. Complete this exercise 1 time per day.  STRENGTH- Cervical Lateral Flexors, Isometric   Stand about 6 inches away from a wall. Place a small pillow, a ball about 6-8 inches in diameter, or a folded towel between the side of your head and the wall.  Slightly tuck your chin and gently tilt your head into the soft object. Push only with mild to moderate intensity, building up tension gradually. Keep your jaw and forehead relaxed.  Hold 10 to 20 seconds. Keep your breathing relaxed.  Release the tension slowly. Relax your neck muscles completely before you start the next repetition. Repeat 2-3 times. Complete this exercise 1 time per day.  STRENGTH - Cervical Extensors, Isometric   Stand about 6 inches away from a wall. Place a small pillow, a ball about 6-8 inches in diameter, or a folded towel between the back of your head and the wall.  Slightly tuck your chin and gently tilt your head back into the soft object. Push only with mild to moderate intensity, building up tension gradually. Keep your jaw and forehead  relaxed.  Hold 10 to 20 seconds. Keep your breathing relaxed.  Release the tension slowly. Relax your neck muscles completely before you start the next repetition. Repeat 2-3 times. Complete this exercise 1 time per day.  POSTURE AND BODY MECHANICS CONSIDERATIONS Keeping correct posture when sitting, standing or completing your activities will reduce the stress put on different body tissues, allowing injured tissues a chance to heal and limiting painful experiences. The following are general guidelines for improved posture. Your physician or physical therapist will provide you with any instructions specific to your needs. While reading these guidelines, remember:  The exercises prescribed by your provider will help you have the flexibility and strength to maintain correct postures.  The correct posture provides the optimal environment for your joints to work. All of your joints have less wear and tear when properly supported by a spine with good posture. This means you will experience a healthier, less painful body.  Correct posture must be practiced with all of your activities, especially prolonged sitting and standing. Correct posture is as important when doing repetitive low-stress activities (typing) as it is when doing a single heavy-load activity (lifting).  PROLONGED STANDING WHILE SLIGHTLY LEANING FORWARD When completing a task that requires you to lean forward while standing in one place for a long time, place either foot up on a stationary 2- to 4-inch high object to help maintain the best posture. When both feet are on the ground, the low back tends to lose its slight inward curve. If this curve flattens (or becomes too large), then the back and your other joints will experience too much stress, fatigue more quickly, and can cause pain.   RESTING POSITIONS Consider which positions are most painful for you when choosing a resting position. If you have pain with flexion-based activities  (sitting, bending, stooping, squatting), choose a position that allows you to rest in a less flexed posture. You would want to avoid curling into a fetal position on your side. If your pain worsens with extension-based activities (prolonged standing, working overhead), avoid resting in an extended position such as sleeping on  your stomach. Most people will find more comfort when they rest with their spine in a more neutral position, neither too rounded nor too arched. Lying on a non-sagging bed on your side with a pillow between your knees, or on your back with a pillow under your knees will often provide some relief. Keep in mind, being in any one position for a prolonged period of time, no matter how correct your posture, can still lead to stiffness.  WALKING Walk with an upright posture. Your ears, shoulders, and hips should all line up. OFFICE WORK When working at a desk, create an environment that supports good, upright posture. Without extra support, muscles fatigue and lead to excessive strain on joints and other tissues.  CHAIR:  A chair should be able to slide under your desk when your back makes contact with the back of the chair. This allows you to work closely.  The chair's height should allow your eyes to be level with the upper part of your monitor and your hands to be slightly lower than your elbows.  Body position: ? Your feet should make contact with the floor. If this is not possible, use a foot rest. ? Keep your ears over your shoulders. This will reduce stress on your neck and low back.
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
## 2020-06-13 ENCOUNTER — Other Ambulatory Visit: Payer: Self-pay | Admitting: Family Medicine

## 2020-06-13 DIAGNOSIS — M940 Chondrocostal junction syndrome [Tietze]: Secondary | ICD-10-CM

## 2020-06-13 NOTE — Progress Notes (Signed)
PT

## 2020-06-14 ENCOUNTER — Ambulatory Visit: Payer: Federal, State, Local not specified - PPO | Admitting: Family Medicine

## 2020-06-21 ENCOUNTER — Encounter: Payer: Self-pay | Admitting: Physical Therapy

## 2020-06-21 ENCOUNTER — Ambulatory Visit: Payer: Federal, State, Local not specified - PPO | Attending: Family Medicine | Admitting: Physical Therapy

## 2020-06-21 ENCOUNTER — Other Ambulatory Visit: Payer: Self-pay

## 2020-06-21 DIAGNOSIS — R252 Cramp and spasm: Secondary | ICD-10-CM | POA: Insufficient documentation

## 2020-06-21 DIAGNOSIS — R293 Abnormal posture: Secondary | ICD-10-CM | POA: Diagnosis present

## 2020-06-21 DIAGNOSIS — M546 Pain in thoracic spine: Secondary | ICD-10-CM | POA: Insufficient documentation

## 2020-06-21 DIAGNOSIS — M6281 Muscle weakness (generalized): Secondary | ICD-10-CM | POA: Diagnosis present

## 2020-06-21 DIAGNOSIS — M542 Cervicalgia: Secondary | ICD-10-CM | POA: Diagnosis present
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
## 2020-06-28 ENCOUNTER — Other Ambulatory Visit: Payer: Self-pay

## 2020-06-28 ENCOUNTER — Ambulatory Visit: Payer: Federal, State, Local not specified - PPO

## 2020-06-28 DIAGNOSIS — M6281 Muscle weakness (generalized): Secondary | ICD-10-CM

## 2020-06-28 DIAGNOSIS — M546 Pain in thoracic spine: Secondary | ICD-10-CM | POA: Diagnosis not present

## 2020-06-28 DIAGNOSIS — R252 Cramp and spasm: Secondary | ICD-10-CM

## 2020-06-28 DIAGNOSIS — M542 Cervicalgia: Secondary | ICD-10-CM

## 2020-06-28 NOTE — Therapy (Addendum)
[facility] [address]  [place], [place], [address] Phone: [phone]   Fax:  [phone]  Physical Therapy Treatment  Patient Details  Name: Margaret [name] MRN: [id number] Date of Birth: Jan 09, 1951 Referring Provider (PT): Crosby Oyster [place], [place]   Encounter Date: 06/28/2020   PT End of Session - 06/28/20 1706    Visit Number 2    Number of Visits 10    Date for PT Re-Evaluation 08/02/20    Authorization Type Federal BCBS    PT Start Time 1658    PT Stop Time 1802    PT Time Calculation (min) 64 min    Activity Tolerance Patient tolerated treatment well    Behavior During Therapy [facility] for tasks assessed/performed           Past Medical History:  Diagnosis Date  . Allergy   . Anemia   . Colitis   . Dyspareunia   . Endometriosis   . GERD (gastroesophageal reflux disease)   . High cholesterol   . Hx of abnormal Pap smear 1980's  . Hypertension   . Osteopenia   . Ulcer   . Urinary incontinence     Past Surgical History:  Procedure Laterality Date  . ABDOMINAL HYSTERECTOMY  09/2008   TVH/BSO--Dr. Quincy Simmonds with TVT  . BLADDER SUSPENSION  09/2008   Dr. Quincy Simmonds  . CERVIX LESION DESTRUCTION  1980   hx abnormal pap--dysplasia  . COLONOSCOPY WITH PROPOFOL Left 10/25/2017   Procedure: COLONOSCOPY WITH PROPOFOL;  Surgeon: Ronnette Juniper, MD;  Location: [facility];  Service: Gastroenterology;  Laterality: Left;    There were no vitals filed for this visit.   Subjective Assessment - 06/28/20 1713    Subjective Pt. noting no issues with HEP.    Pertinent History osteopenia, HTN, HLD, GERD, anemia    Diagnostic tests 05/22/20 chest xray: negative    Patient Stated Goals help manage chest pain    Currently in Pain? Yes    Pain Score 3     Pain Location Chest    Pain Orientation Left    Pain Descriptors / Indicators Pressure    Pain Type Chronic pain    Aggravating Factors  stretches     Multiple Pain Sites No                             [facility] Adult PT Treatment/Exercise - 06/28/20 0001      Neck Exercises: Theraband   Rows 10 reps    Rows Limitations 5" hold - red TB      Lumbar Exercises: Seated   Other Seated Lumbar Exercises Seated thoracic extension 3" x 10 reps       Lumbar Exercises: Quadruped   Madcat/Old Horse 10 reps    Madcat/Old Horse Limitations 3" holds       Manual Therapy   Manual Therapy Soft tissue mobilization;Myofascial release    Manual therapy comments seated     Soft tissue mobilization STM/DTM to B LS, UT, rhomboids, scalenes     Myofascial Release TPR to B Ut,       Neck Exercises: Stretches   Upper Trapezius Stretch Right;Left;30 seconds;1 rep    Neck Stretch 1 rep;30 seconds    Neck Stretch Limitations B scalenes     Corner Stretch 1 rep;30 seconds    Corner Stretch Limitations low - poor tolerance thus terminated  Modalities: E-stim/moist heat to B uppers, IFC, 15', 80-150Hz , intensity to pt. Tolerance         PT Short Term Goals - 06/28/20 1707      PT SHORT TERM GOAL #1   Title independent with initial HEP    Time 3    Period Weeks    Status On-going    Target Date 07/12/20             PT Long Term Goals - 06/28/20 1707      PT LONG TERM GOAL #1   Title Patient to be independent with advanced HEP.    Time 6    Period Weeks    Status On-going      PT LONG TERM GOAL #2   Title Patient to demonstrate thoracic AROM WFL and without pain limiting.    Time 6    Period Weeks    Status On-going      PT LONG TERM GOAL #3   Title Patient to demonstrate B shoulder strength >/=4+/5.    Time 6    Period Weeks    Status On-going      PT LONG TERM GOAL #4   Title report =/> 75% reduction in overall pain of neck and chest with daily activity.    Time 6    Period Weeks    Status On-going                 Plan - 06/28/20 1707    Clinical Impression Statement Pt.  Reporting no issues with HEP aside from having some soreness with neck exercises.  Tolerated all postural exercises today well with exception of gentle chest stretch on doorway which caused short-lasting pain increase.  Ended visit with E-stim/moist heat to reduce upper shoulder pain and tension with good relief.     Comorbidities osteopenia, HTN, HLD, GERD, anemia    Rehab Potential Good    PT Treatment/Interventions ADLs/Self Care Home Management;Cryotherapy;Electrical Stimulation;Iontophoresis 4mg /ml Dexamethasone;Moist Heat;Traction;Therapeutic exercise;Therapeutic activities;Functional mobility training;Stair training;Gait training;Ultrasound;Neuromuscular re-education;Patient/family education;Manual techniques;Taping;Energy conservation;Dry needling;Passive range of motion    PT Next Visit Plan Progress thoracic mobility, STM, cervical and chest stretching    Consulted and Agree with Plan of Care Patient           Patient will benefit from skilled therapeutic intervention in order to improve the following deficits and impairments:  Hypomobility, Decreased activity tolerance, Pain, Impaired UE functional use, Increased fascial restricitons, Decreased strength, Increased muscle spasms, Improper body mechanics, Decreased range of motion, Postural dysfunction, Impaired flexibility  Visit Diagnosis: Pain in thoracic spine  Cervicalgia  Cramp and spasm  Muscle weakness (generalized)     Problem List Patient Active Problem List   Diagnosis Date Noted  . Gastroesophageal reflux disease 06/06/2020  . Atypical chest pain 04/21/2020  . Closed nondisplaced fracture of proximal phalanx of lesser toe of left foot 11/19/2017  . GI bleed 10/23/2017  . Hypokalemia 10/23/2017  . Nausea vomiting and diarrhea 10/23/2017  . Hematochezia 10/23/2017  . 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 Back pain 04/12/2014  . Arthritis 04/12/2014  . Knee pain 04/12/2014  . Essential  hypertension 11/27/2013  . Osteoarthritis of left knee 07/16/2012    Bess Harvest, PTA 06/28/2020, 6:12 PM  [facility] [address]  [place], [place], [address] Phone: [phone]   Fax:  [phone]  Name: Margaret [name] MRN: [id number] Date of Birth: [birth year]

## 2020-06-30 ENCOUNTER — Ambulatory Visit: Payer: Federal, State, Local not specified - PPO

## 2020-06-30 ENCOUNTER — Other Ambulatory Visit: Payer: Self-pay

## 2020-06-30 DIAGNOSIS — M546 Pain in thoracic spine: Secondary | ICD-10-CM

## 2020-06-30 DIAGNOSIS — M6281 Muscle weakness (generalized): Secondary | ICD-10-CM

## 2020-06-30 DIAGNOSIS — R293 Abnormal posture: Secondary | ICD-10-CM

## 2020-06-30 DIAGNOSIS — M542 Cervicalgia: Secondary | ICD-10-CM

## 2020-06-30 DIAGNOSIS — R252 Cramp and spasm: Secondary | ICD-10-CM

## 2020-06-30 NOTE — Therapy (Signed)
[facility] [address]  [place], [place], [address] Phone: [phone]   Fax:  [phone]  Physical Therapy Treatment  Patient Details  Name: Margaret [name] MRN: [id number] Date of Birth: June 11, 1951 Referring Provider (PT): Crosby Oyster [place], [place]   Encounter Date: 06/30/2020   PT End of Session - 06/30/20 0815    Visit Number 3    Number of Visits 10    Date for PT Re-Evaluation 08/02/20    Authorization Type Federal BCBS    PT Start Time 0803    PT Stop Time 0856    PT Time Calculation (min) 53 min    Activity Tolerance Patient tolerated treatment well    Behavior During Therapy [facility] for tasks assessed/performed           Past Medical History:  Diagnosis Date  . Allergy   . Anemia   . Colitis   . Dyspareunia   . Endometriosis   . GERD (gastroesophageal reflux disease)   . High cholesterol   . Hx of abnormal Pap smear 1980's  . Hypertension   . Osteopenia   . Ulcer   . Urinary incontinence     Past Surgical History:  Procedure Laterality Date  . ABDOMINAL HYSTERECTOMY  09/2008   TVH/BSO--Dr. Quincy Simmonds with TVT  . BLADDER SUSPENSION  09/2008   Dr. Quincy Simmonds  . CERVIX LESION DESTRUCTION  1980   hx abnormal pap--dysplasia  . COLONOSCOPY WITH PROPOFOL Left 10/25/2017   Procedure: COLONOSCOPY WITH PROPOFOL;  Surgeon: Ronnette Juniper, MD;  [facility];  Service: Gastroenterology;  Laterality: Left;    There were no vitals filed for this visit.   Subjective Assessment - 06/30/20 0806    Subjective Pt. noting some soreness in chest after last session which improved somewhat.    Pertinent History osteopenia, HTN, HLD, GERD, anemia    Diagnostic tests 05/22/20 chest xray: negative    Patient Stated Goals help manage chest pain    Currently in Pain? Yes    Pain Location Chest    Pain Orientation Left    Pain Descriptors / Indicators Pressure    Pain Type Chronic pain                               [facility] Adult PT Treatment/Exercise - 06/30/20 0001      Neck Exercises: Theraband   Shoulder Extension 10 reps;Red    Shoulder Extension Limitations red TB closed in door    Rows 15 reps    Rows Limitations 3" hold - red TB      Lumbar Exercises: Stretches   Lumbar Stabilization Level 1 3 reps;20 seconds    Lumbar Stabilization Level 1 Limitations 3-way green p-ball lumbar stretch     Other Lumbar Stretch Exercise Prone childs pose 3 x 20 sec each way       Lumbar Exercises: Quadruped   Madcat/Old Horse 15 reps    Madcat/Old Horse Limitations 3" holds     Other Quadruped Lumbar Exercises Gentle B "thread the needle" 3" x 5 reps    "pulling" in chest      Modalities   Modalities Moist Heat;Electrical Stimulation      Moist Heat Therapy   Number Minutes Moist Heat 10 Minutes    Moist Heat Location Shoulder   B UT     Electrical Stimulation   Electrical Stimulation Location B UT  Electrical Stimulation Action IFC    Electrical Stimulation Parameters 80-150Hz , intensity to pt. tolerance, 10'    Electrical Stimulation Goals Pain;Tone                    PT Short Term Goals - 06/28/20 1707      PT SHORT TERM GOAL #1   Title independent with initial HEP    Time 3    Period Weeks    Status On-going    Target Date 07/12/20             PT Long Term Goals - 06/28/20 1707      PT LONG TERM GOAL #1   Title Patient to be independent with advanced HEP.    Time 6    Period Weeks    Status On-going      PT LONG TERM GOAL #2   Title Patient to demonstrate thoracic AROM WFL and without pain limiting.    Time 6    Period Weeks    Status On-going      PT LONG TERM GOAL #3   Title Patient to demonstrate B shoulder strength >/=4+/5.    Time 6    Period Weeks    Status On-going      PT LONG TERM GOAL #4   Title report =/> 75% reduction in overall pain of neck and chest with daily activity.    Time 6    Period Weeks     Status On-going                 Plan - 06/30/20 0939    Clinical Impression Statement Margaret [name] denies significant pain after last session however notes some "chest" irritation which subsided.  did note typical R-sided chest discomfort occasionally throughout session which seemed to be during pec stretching positioning which was relieved with rest.  Progressed scapular strengthening and instructed pt. to avoid painful arc of movement patterns.  Ended visit with complaint of B upper shoulder tension/pain thus continued E-stim/moist heat to B UT as pt. noting good relief with this last session.    Comorbidities osteopenia, HTN, HLD, GERD, anemia    Rehab Potential Good    PT Treatment/Interventions ADLs/Self Care Home Management;Cryotherapy;Electrical Stimulation;Iontophoresis 4mg /ml Dexamethasone;Moist Heat;Traction;Therapeutic exercise;Therapeutic activities;Functional mobility training;Stair training;Gait training;Ultrasound;Neuromuscular re-education;Patient/family education;Manual techniques;Taping;Energy conservation;Dry needling;Passive range of motion    PT Next Visit Plan Progress thoracic mobility, STM, cervical and chest stretching    Consulted and Agree with Plan of Care Patient           Patient will benefit from skilled therapeutic intervention in order to improve the following deficits and impairments:  Hypomobility, Decreased activity tolerance, Pain, Impaired UE functional use, Increased fascial restricitons, Decreased strength, Increased muscle spasms, Improper body mechanics, Decreased range of motion, Postural dysfunction, Impaired flexibility  Visit Diagnosis: Pain in thoracic spine  Cervicalgia  Cramp and spasm  Muscle weakness (generalized)  Abnormal posture     Problem List Patient Active Problem List   Diagnosis Date Noted  . Gastroesophageal reflux disease 06/06/2020  . Atypical chest pain 04/21/2020  . Closed nondisplaced fracture of proximal phalanx  of lesser toe of left foot 11/19/2017  . GI bleed 10/23/2017  . Hypokalemia 10/23/2017  . Nausea vomiting and diarrhea 10/23/2017  . Hematochezia 10/23/2017  . 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 Back pain 04/12/2014  . Arthritis 04/12/2014  . Knee pain 04/12/2014  . Essential hypertension 11/27/2013  . Osteoarthritis of left knee 07/16/2012  Bess Harvest, PTA 06/30/20 9:45 AM   [facility] [address]  [place], [place], [address] Phone: [phone]   Fax:  [phone]  Name: Margaret [name] MRN: [id number] Date of Birth: 1951-01-15

## 2020-06-30 NOTE — Patient Instructions (Signed)
TENS stands for Transcutaneous Electrical Nerve Stimulation. In other words, electrical impulses are allowed to pass through the skin in order to excite a nerve.   Purpose and Use of TENS:  TENS is a method used to manage acute and chronic pain without the use of drugs. It has been effective in managing pain associated with surgery, sprains, strains, trauma, rheumatoid arthritis, and neuralgias. It is a non-addictive, low risk, and non-invasive technique used to control pain. It is not, by any means, a curative form of treatment.   How TENS Works:  Most TENS units are a Paramedic unit powered by one 9 volt battery. Attached to the outside of the unit are two lead wires where two pins and/or snaps connect on each wire. All units come with a set of four reusable pads or electrodes. These are placed on the skin surrounding the area involved. By inserting the leads into  the pads, the electricity can pass from the unit making the circuit complete.  As the intensity is turned up slowly, the electrical current enters the body from the electrodes through the skin to the surrounding nerve fibers. This triggers the release of hormones from within the body. These hormones contain pain relievers. By increasing the circulation of these hormones, the person's pain may be lessened. It is also believed that the electrical stimulation itself helps to block the pain messages being sent to the brain, thus also decreasing the body's perception of pain.   Hazards:  TENS units are NOT to be used by patients with PACEMAKERS, DEFIBRILLATORS, DIABETIC PUMPS, PREGNANT WOMEN, and patients with SEIZURE DISORDERS.  TENS units are NOT to be used over the heart, throat, brain, or spinal cord.  One of the major side effects from the TENS unit may be skin irritation. Some people may develop a rash if they are sensitive to the materials used in the electrodes or the connecting wires.   Wear the unit for 15 min.   Avoid  overuse due the body getting used to the stem making it not as effective over time.

## 2020-07-05 ENCOUNTER — Ambulatory Visit: Payer: Federal, State, Local not specified - PPO | Admitting: Physical Therapy

## 2020-07-05 ENCOUNTER — Other Ambulatory Visit: Payer: Self-pay

## 2020-07-05 ENCOUNTER — Encounter: Payer: Self-pay | Admitting: Physical Therapy

## 2020-07-05 DIAGNOSIS — M6281 Muscle weakness (generalized): Secondary | ICD-10-CM

## 2020-07-05 DIAGNOSIS — M546 Pain in thoracic spine: Secondary | ICD-10-CM | POA: Diagnosis not present

## 2020-07-05 DIAGNOSIS — M542 Cervicalgia: Secondary | ICD-10-CM

## 2020-07-05 DIAGNOSIS — R252 Cramp and spasm: Secondary | ICD-10-CM

## 2020-07-05 DIAGNOSIS — R293 Abnormal posture: Secondary | ICD-10-CM

## 2020-07-05 NOTE — Therapy (Signed)
[facility] [address]  [place], [place], [address] Phone: [phone]   Fax:  [phone]  Physical Therapy Treatment  Patient Details  Name: Margaret [name] MRN: [id number] Date of Birth: 30-Sep-1951 Referring Provider (PT): Crosby Oyster [place], [place]   Encounter Date: 07/05/2020   PT End of Session - 07/05/20 1750    Visit Number 4    Number of Visits 10    Date for PT Re-Evaluation 08/02/20    Authorization Type Federal BC[employer]    PT Start Time 1706    PT Stop Time 1748    PT Time Calculation (min) 42 min    Activity Tolerance Patient tolerated treatment well;Patient limited by pain    Behavior During Therapy [facility] for tasks assessed/performed           Past Medical History:  Diagnosis Date  . Allergy   . Anemia   . Colitis   . Dyspareunia   . Endometriosis   . GERD (gastroesophageal reflux disease)   . High cholesterol   . Hx of abnormal Pap smear 1980's  . Hypertension   . Osteopenia   . Ulcer   . Urinary incontinence     Past Surgical History:  Procedure Laterality Date  . ABDOMINAL HYSTERECTOMY  09/2008   TVH/BSO--Dr. Quincy Simmonds with TVT  . BLADDER SUSPENSION  09/2008   Dr. Quincy Simmonds  . CERVIX LESION DESTRUCTION  1980   hx abnormal pap--dysplasia  . COLONOSCOPY WITH PROPOFOL Left 10/25/2017   Procedure: COLONOSCOPY WITH PROPOFOL;  Surgeon: Ronnette Juniper, MD;  Location: [facility];  Service: Gastroenterology;  Laterality: Left;    There were no vitals filed for this visit.   Subjective Assessment - 07/05/20 1705    Subjective Bringing in personal TENs unit today. "Feeling pretty good." Having some R chest soreness currently but this comes and goes.    Pertinent History osteopenia, HTN, HLD, GERD, anemia    Diagnostic tests 05/22/20 chest xray: negative    Patient Stated Goals help manage chest pain    Currently in Pain? Yes    Pain Score 6     Pain Location Chest    Pain  Orientation Right    Pain Descriptors / Indicators Pressure    Pain Type Chronic pain              [facility] PT Assessment - 07/05/20 0001      Observation/Other Assessments   Focus on Therapeutic Outcomes (FOTO)  ribs: 50 (50% limited, 41 predicted)                         [facility] Adult PT Treatment/Exercise - 07/05/20 0001      Exercises   Exercises Shoulder;Lumbar      Lumbar Exercises: Standing   Other Standing Lumbar Exercises R/L open book stretch at wall 5x each   more challenge to L   Other Standing Lumbar Exercises thoracic extension at wall 5x to tolerance   limited ROM; no pain     Lumbar Exercises: Prone   Other Prone Lumbar Exercises prone press up 10x3" to tolerance      Shoulder Exercises: ROM/Strengthening   UBE (Upper Arm Bike) L1 x 3 min forward/3 min back      Manual Therapy   Manual Therapy Joint mobilization    Manual therapy comments prone    Joint Mobilization central thoracic PAs grade III to tolerance; R and L posterior rib  springing grade II/III to tolerance                  PT Education - 07/05/20 1749    Education Details update to HEP; edu on personal TENS unit modes for max comfort and benefit    Person(s) Educated Patient    Methods Explanation;Demonstration;Tactile cues;Verbal cues;Handout    Comprehension Verbalized understanding;Returned demonstration            PT Short Term Goals - 06/28/20 1707      PT SHORT TERM GOAL #1   Title independent with initial HEP    Time 3    Period Weeks    Status On-going    Target Date 07/12/20             PT Long Term Goals - 06/28/20 1707      PT LONG TERM GOAL #1   Title Patient to be independent with advanced HEP.    Time 6    Period Weeks    Status On-going      PT LONG TERM GOAL #2   Title Patient to demonstrate thoracic AROM WFL and without pain limiting.    Time 6    Period Weeks    Status On-going      PT LONG TERM GOAL #3   Title Patient to  demonstrate B shoulder strength >/=4+/5.    Time 6    Period Weeks    Status On-going      PT LONG TERM GOAL #4   Title report =/> 75% reduction in overall pain of neck and chest with daily activity.    Time 6    Period Weeks    Status On-going                 Plan - 07/05/20 1750    Clinical Impression Statement Patient reporting continued chest pain, particularly when "opening my chest" or stretching her chest. Initiated thoracic and posterior rib joint mobilizations to address stiffness, as patient demonstrated considerable hypomobility throughout the thoracic spine. Patient particularly tender at T5 and T6- mobilizations were adjusted to patient's tolerance. Worked on thoracic AROM with patient demonstrating limited ROM in extension in prone and standing. However, patient denied pain. Better tolerance was demonstrated with spinal rotation. Updated HEP with exercise that was well-tolerated today. Patient reported understanding and without complaints at end of session.    Comorbidities osteopenia, HTN, HLD, GERD, anemia    Rehab Potential Good    PT Treatment/Interventions ADLs/Self Care Home Management;Cryotherapy;Electrical Stimulation;Iontophoresis 4mg /ml Dexamethasone;Moist Heat;Traction;Therapeutic exercise;Therapeutic activities;Functional mobility training;Stair training;Gait training;Ultrasound;Neuromuscular re-education;Patient/family education;Manual techniques;Taping;Energy conservation;Dry needling;Passive range of motion    PT Next Visit Plan Progress thoracic mobility, STM, cervical and chest stretching    Consulted and Agree with Plan of Care Patient           Patient will benefit from skilled therapeutic intervention in order to improve the following deficits and impairments:  Hypomobility, Decreased activity tolerance, Pain, Impaired UE functional use, Increased fascial restricitons, Decreased strength, Increased muscle spasms, Improper body mechanics, Decreased  range of motion, Postural dysfunction, Impaired flexibility  Visit Diagnosis: Pain in thoracic spine  Cervicalgia  Cramp and spasm  Muscle weakness (generalized)  Abnormal posture     Problem List Patient Active Problem List   Diagnosis Date Noted  . Gastroesophageal reflux disease 06/06/2020  . Atypical chest pain 04/21/2020  . Closed nondisplaced fracture of proximal phalanx of lesser toe of left foot 11/19/2017  . GI bleed 10/23/2017  .  Hypokalemia 10/23/2017  . Nausea vomiting and diarrhea 10/23/2017  . Hematochezia 10/23/2017  . 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 Back pain 04/12/2014  . Arthritis 04/12/2014  . Knee pain 04/12/2014  . Essential hypertension 11/27/2013  . Osteoarthritis of left knee 07/16/2012     Janene Harvey, PT, DPT 07/05/20 5:55 PM   [facility] [address]  [place], [place], [address] Phone: [phone]   Fax:  [phone]  Name: Margaret [name] MRN: [id number] Date of Birth: 02-23-1951

## 2020-07-07 ENCOUNTER — Other Ambulatory Visit: Payer: Self-pay

## 2020-07-07 ENCOUNTER — Ambulatory Visit: Payer: Federal, State, Local not specified - PPO

## 2020-07-07 DIAGNOSIS — R293 Abnormal posture: Secondary | ICD-10-CM

## 2020-07-07 DIAGNOSIS — M542 Cervicalgia: Secondary | ICD-10-CM

## 2020-07-07 DIAGNOSIS — R252 Cramp and spasm: Secondary | ICD-10-CM

## 2020-07-07 DIAGNOSIS — M546 Pain in thoracic spine: Secondary | ICD-10-CM

## 2020-07-07 DIAGNOSIS — M6281 Muscle weakness (generalized): Secondary | ICD-10-CM

## 2020-07-07 NOTE — Therapy (Signed)
[facility] [address]  [place], [place], [address] Phone: [phone]   Fax:  [phone]  Physical Therapy Treatment  Patient Details  Name: Margaret [name] MRN: [id number] Date of Birth: 1951/11/06 Referring Provider (PT): Crosby Oyster [place], [place]   Encounter Date: 07/07/2020   PT End of Session - 07/07/20 0816    Visit Number 5    Number of Visits 10    Date for PT Re-Evaluation 08/02/20    Authorization Type Federal BCBS    PT Start Time 0804    PT Stop Time 0845    PT Time Calculation (min) 41 min    Activity Tolerance Patient tolerated treatment well;Patient limited by pain    Behavior During Therapy [facility] for tasks assessed/performed           Past Medical History:  Diagnosis Date  . Allergy   . Anemia   . Colitis   . Dyspareunia   . Endometriosis   . GERD (gastroesophageal reflux disease)   . High cholesterol   . Hx of abnormal Pap smear 1980's  . Hypertension   . Osteopenia   . Ulcer   . Urinary incontinence     Past Surgical History:  Procedure Laterality Date  . ABDOMINAL HYSTERECTOMY  09/2008   TVH/BSO--Dr. Quincy Simmonds with TVT  . BLADDER SUSPENSION  09/2008   Dr. Quincy Simmonds  . CERVIX LESION DESTRUCTION  1980   hx abnormal pap--dysplasia  . COLONOSCOPY WITH PROPOFOL Left 10/25/2017   Procedure: COLONOSCOPY WITH PROPOFOL;  Surgeon: Ronnette Juniper, MD;  Location: [facility];  Service: Gastroenterology;  Laterality: Left;    There were no vitals filed for this visit.   Subjective Assessment - 07/07/20 0813    Subjective Pt. expressing some frustration regarding her ongoing R-sided intermittent chest pain.    Pertinent History osteopenia, HTN, HLD, GERD, anemia    Diagnostic tests 05/22/20 chest xray: negative    Patient Stated Goals help manage chest pain    Currently in Pain? Yes    Pain Score 0-No pain   up to 6/10 at times   Pain Location Chest    Pain Orientation  Right    Pain Descriptors / Indicators Pressure    Pain Type Chronic pain    Multiple Pain Sites No                             [facility] Adult PT Treatment/Exercise - 07/07/20 0001      Neck Exercises: Theraband   Horizontal ABduction 15 reps;Red    Horizontal ABduction Limitations Leaning on pool noodle       Shoulder Exercises: Standing   Horizontal ABduction Both;10 reps;Theraband;Strengthening    Theraband Level (Shoulder Horizontal ABduction) Level 2 (Red)    Horizontal ABduction Limitations leaning on pool noodle on wall     Row Both;15 reps    Theraband Level (Shoulder Row) Level 2 (Red)    Row Limitations red TB closed in door       Shoulder Exercises: ROM/Strengthening   UBE (Upper Arm Bike) L1 x 3 min forward/3 min back    Cybex Row 15 reps    Cybex Row Limitations 10# - low row       Neck Exercises: Land 3 reps;30 seconds    Corner Stretch Limitations B low single arm x 30 sec x 3 reps each  PT Education - 07/07/20 0923    Education Details HEP update;  supine horizontal abduction with red TB, red TB row    Person(s) Educated Patient    Methods Explanation;Demonstration;Verbal cues;Handout    Comprehension Verbalized understanding;Returned demonstration;Verbal cues required            PT Short Term Goals - 07/07/20 0833      PT SHORT TERM GOAL #1   Title independent with initial HEP    Time 3    Period Weeks    Status Achieved    Target Date 07/12/20             PT Long Term Goals - 06/28/20 1707      PT LONG TERM GOAL #1   Title Patient to be independent with advanced HEP.    Time 6    Period Weeks    Status On-going      PT LONG TERM GOAL #2   Title Patient to demonstrate thoracic AROM WFL and without pain limiting.    Time 6    Period Weeks    Status On-going      PT LONG TERM GOAL #3   Title Patient to demonstrate B shoulder strength >/=4+/5.    Time 6    Period Weeks     Status On-going      PT LONG TERM GOAL #4   Title report =/> 75% reduction in overall pain of neck and chest with daily activity.    Time 6    Period Weeks    Status On-going                 Plan - 07/07/20 4268    Clinical Impression Statement Pt. reports she understands initial HEP and denies questions.  STG #1 met.  Notes she would like to schedule one more visit with PT for her to attend after she goes on weeklong vacation to [facility] to see how she feels.  Progressed chest stretching to pt. tolerance along with HEP updated for progression of scapular training.  Pt. verbalized understanding and plans to perform HEP consistently over vacation.    Comorbidities osteopenia, HTN, HLD, GERD, anemia    Rehab Potential Good    PT Treatment/Interventions ADLs/Self Care Home Management;Cryotherapy;Electrical Stimulation;Iontophoresis 56m/ml Dexamethasone;Moist Heat;Traction;Therapeutic exercise;Therapeutic activities;Functional mobility training;Stair training;Gait training;Ultrasound;Neuromuscular re-education;Patient/family education;Manual techniques;Taping;Energy conservation;Dry needling;Passive range of motion    PT Next Visit Plan Progress thoracic mobility, STM, cervical and chest stretching    Consulted and Agree with Plan of Care Patient           Patient will benefit from skilled therapeutic intervention in order to improve the following deficits and impairments:  Hypomobility, Decreased activity tolerance, Pain, Impaired UE functional use, Increased fascial restricitons, Decreased strength, Increased muscle spasms, Improper body mechanics, Decreased range of motion, Postural dysfunction, Impaired flexibility  Visit Diagnosis: Pain in thoracic spine  Cervicalgia  Cramp and spasm  Muscle weakness (generalized)  Abnormal posture     Problem List Patient Active Problem List   Diagnosis Date Noted  . Gastroesophageal reflux disease 06/06/2020  . Atypical chest pain  04/21/2020  . Closed nondisplaced fracture of proximal phalanx of lesser toe of left foot 11/19/2017  . GI bleed 10/23/2017  . Hypokalemia 10/23/2017  . Nausea vomiting and diarrhea 10/23/2017  . Hematochezia 10/23/2017  . 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 Back pain 04/12/2014  . Arthritis 04/12/2014  . Knee pain 04/12/2014  . Essential hypertension 11/27/2013  . Osteoarthritis  of left knee 07/16/2012    Bess Harvest, PTA 07/07/20 12:44 PM   [facility] [address]  [place], [place], [address] Phone: [phone]   Fax:  [phone]  Name: Margaret [name] MRN: [id number] Date of Birth: [birth year]

## 2020-07-20 ENCOUNTER — Encounter: Payer: Self-pay | Admitting: Physical Therapy

## 2020-07-26 ENCOUNTER — Other Ambulatory Visit: Payer: Self-pay | Admitting: Family Medicine

## 2020-07-26 ENCOUNTER — Other Ambulatory Visit: Payer: Self-pay | Admitting: Physician Assistant

## 2020-07-26 DIAGNOSIS — R109 Unspecified abdominal pain: Secondary | ICD-10-CM

## 2020-07-26 DIAGNOSIS — Z1231 Encounter for screening mammogram for malignant neoplasm of breast: Secondary | ICD-10-CM

## 2020-07-28 ENCOUNTER — Other Ambulatory Visit: Payer: Self-pay

## 2020-07-28 ENCOUNTER — Ambulatory Visit: Payer: Federal, State, Local not specified - PPO | Attending: Family Medicine | Admitting: Physical Therapy

## 2020-07-28 ENCOUNTER — Encounter: Payer: Self-pay | Admitting: Physical Therapy

## 2020-07-28 DIAGNOSIS — R252 Cramp and spasm: Secondary | ICD-10-CM | POA: Insufficient documentation

## 2020-07-28 DIAGNOSIS — M6281 Muscle weakness (generalized): Secondary | ICD-10-CM | POA: Insufficient documentation

## 2020-07-28 DIAGNOSIS — R293 Abnormal posture: Secondary | ICD-10-CM | POA: Insufficient documentation

## 2020-07-28 DIAGNOSIS — M546 Pain in thoracic spine: Secondary | ICD-10-CM | POA: Insufficient documentation

## 2020-07-28 DIAGNOSIS — M542 Cervicalgia: Secondary | ICD-10-CM | POA: Insufficient documentation

## 2020-07-28 NOTE — Therapy (Signed)
[facility] [address]  [place], [place], [address] Phone: [phone]   Fax:  [phone]  Physical Therapy Discharge Summary  Patient Details  Name: Margaret [name] MRN: [id number] Date of Birth: October 26, 1951 Referring Provider (PT): Crosby Oyster [place], [place]   Encounter Date: 07/28/2020   PT End of Session - 07/28/20 1147    Visit Number 6    Number of Visits 10    Date for PT Re-Evaluation 08/02/20    Authorization Type Federal [employer]BS    PT Start Time [phone]    PT Stop Time 1012    PT Time Calculation (min) 41 min    Activity Tolerance Patient tolerated treatment well;Patient limited by pain    Behavior During Therapy [facility] for tasks assessed/performed           Past Medical History:  Diagnosis Date  . Allergy   . Anemia   . Colitis   . Dyspareunia   . Endometriosis   . GERD (gastroesophageal reflux disease)   . High cholesterol   . Hx of abnormal Pap smear 1980's  . Hypertension   . Osteopenia   . Ulcer   . Urinary incontinence     Past Surgical History:  Procedure Laterality Date  . ABDOMINAL HYSTERECTOMY  09/2008   TVH/BSO--Dr. Quincy Simmonds with TVT  . BLADDER SUSPENSION  09/2008   Dr. Quincy Simmonds  . CERVIX LESION DESTRUCTION  1980   hx abnormal pap--dysplasia  . COLONOSCOPY WITH PROPOFOL Left 10/25/2017   Procedure: COLONOSCOPY WITH PROPOFOL;  Surgeon: Ronnette Juniper, MD;  Location: [facility];  Service: Gastroenterology;  Laterality: Left;    There were no vitals filed for this visit.   Subjective Assessment - 07/28/20 0930    Subjective Has had a lot going on lately- drove to [facility] which probably did not help her back. Has been having some LB and tailbone pain, chest pain, and pain in her side. Also retired yesterday. Has not been working much on her HEP d/t having a lot going on. Has been feeling weak- has some GI imaging coming up. Feels like she would like to wrap up with PT today as she  has learned a lot.    Pertinent History osteopenia, HTN, HLD, GERD, anemia    Diagnostic tests 05/22/20 chest xray: negative    Patient Stated Goals help manage chest pain    Currently in Pain? Yes    Pain Score 4     Pain Location Chest    Pain Orientation Right    Pain Descriptors / Indicators Pressure    Pain Type Chronic pain    Multiple Pain Sites Yes    Pain Score 4    Pain Location Rib cage    Pain Orientation Left    Pain Descriptors / Indicators Aching    Pain Type Acute pain              [facility] PT Assessment - 07/28/20 0001      Assessment   Medical Diagnosis Costochondritis    Referring Provider (PT) Shelda Pal, DO    Onset Date/Surgical Date --   sept 2016     Observation/Other Assessments   Focus on Therapeutic Outcomes (FOTO)  ribs: 50 (50% limited, 41 predicted)      AROM   Thoracic Flexion WNL   mild LBP & chest pain   Thoracic Extension severely limited   L lateral ribcage moderate pain   Thoracic - Right  Side Bend mildly limited   B shoulder and ribcage discomfort   Thoracic - Left Side Bend mildly limited   B shoulder and ribcage discomfort   Thoracic - Right Rotation mildly limited   mod-severe chest pain   Thoracic - Left Rotation mildly limited   mod-severe chest pain     Strength   Right Shoulder Flexion 4+/5    Right Shoulder ABduction 4+/5    Right Shoulder Internal Rotation 4/5    Right Shoulder External Rotation 4+/5    Left Shoulder Flexion 4+/5    Left Shoulder ABduction 4+/5    Left Shoulder Internal Rotation 4/5    Left Shoulder External Rotation 4+/5                         [facility] Adult PT Treatment/Exercise - 07/28/20 0001      Lumbar Exercises: Seated   Other Seated Lumbar Exercises prayer stretch with green pball 5x5" forward, 5x5" to each diagonal   report of good stretch     Shoulder Exercises: Standing   Internal Rotation Strengthening;Right;Left;10 reps;Theraband    Theraband Level (Shoulder  Internal Rotation) Level 2 (Red)    Internal Rotation Limitations stopping at midpoint      Shoulder Exercises: ROM/Strengthening   UBE (Upper Arm Bike) L2 x 3 min forward/3 min back                  PT Education - 07/28/20 1146    Education Details update/consolidation of HEP; discussion on objective progress and remaining impairments; educated patient on importance of improved HEP compliance    Person(s) Educated Patient    Methods Explanation;Demonstration;Tactile cues;Verbal cues;Handout    Comprehension Verbalized understanding;Returned demonstration            PT Short Term Goals - 07/28/20 0939      PT SHORT TERM GOAL #1   Title independent with initial HEP    Time 3    Period Weeks    Status Achieved    Target Date 07/12/20             PT Long Term Goals - 07/28/20 0939      PT LONG TERM GOAL #1   Title Patient to be independent with advanced HEP.    Time 6    Period Weeks    Status Partially Met   reporting poor compliance but denies questions     PT LONG TERM GOAL #2   Title Patient to demonstrate thoracic AROM WFL and without pain limiting.    Time 6    Period Weeks    Status Not Met   improved in B sidebending and B rotation     PT LONG TERM GOAL #3   Title Patient to demonstrate B shoulder strength >/=4+/5.    Time 6    Period Weeks    Status Partially Met   still limited in B shoulder IR     PT LONG TERM GOAL #4   Title report =/> 75% reduction in overall pain of neck and chest with daily activity.    Time 6    Period Weeks    Status Not Met   reporting 30% improvement                Plan - 07/28/20 1141    Clinical Impression Statement Patient reporting increased LB and coccyx pain since driving to [facility] and back for vacation. Also reporting continued chest and  L ribcage pain. Admits to poor HEP compliance d/t having a lot going on, but notes that she would like to wrap up with PT today d/t having learned a lot. Strength testing  revealed improvement in R shoulder abduction and L shoulder ER. Thoracic AROM has improved in B sidebending and rotation, but still with severe limitation in thoracic extension. Overall, patient reporting 30% improvement in pain levels since starting therapy. Worked on gentle stretching and RTC strengthening ther-ex to address remaining impairments. Patient tolerated these exercises well and particularly noted good benefit with prayer stretch. Consolidated HEP for max benefit and educated patient on importance of improved consistency with HEP for continued benefit. Patient reported understanding and without complaints at end of session. Patient now D/C'd at this time per her request.    Comorbidities osteopenia, HTN, HLD, GERD, anemia    Rehab Potential Good    PT Treatment/Interventions ADLs/Self Care Home Management;Cryotherapy;Electrical Stimulation;Iontophoresis 67m/ml Dexamethasone;Moist Heat;Traction;Therapeutic exercise;Therapeutic activities;Functional mobility training;Stair training;Gait training;Ultrasound;Neuromuscular re-education;Patient/family education;Manual techniques;Taping;Energy conservation;Dry needling;Passive range of motion    PT Next Visit Plan DC at this time    Consulted and Agree with Plan of Care Patient           Patient will benefit from skilled therapeutic intervention in order to improve the following deficits and impairments:  Hypomobility, Decreased activity tolerance, Pain, Impaired UE functional use, Increased fascial restricitons, Decreased strength, Increased muscle spasms, Improper body mechanics, Decreased range of motion, Postural dysfunction, Impaired flexibility  Visit Diagnosis: Pain in thoracic spine  Cervicalgia  Cramp and spasm  Muscle weakness (generalized)  Abnormal posture     Problem List Patient Active Problem List   Diagnosis Date Noted  . Gastroesophageal reflux disease 06/06/2020  . Atypical chest pain 04/21/2020  . Closed  nondisplaced fracture of proximal phalanx of lesser toe of left foot 11/19/2017  . GI bleed 10/23/2017  . Hypokalemia 10/23/2017  . Nausea vomiting and diarrhea 10/23/2017  . Hematochezia 10/23/2017  . 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 Back pain 04/12/2014  . Arthritis 04/12/2014  . Knee pain 04/12/2014  . Essential hypertension 11/27/2013  . Osteoarthritis of left knee 07/16/2012    JManuela Neptune9/17/2021, 11:47 AM  [facility] [place] [address]Phone: [phone]  Fax:  [phone] Name: CChala GulMRN: [id number] of Birth: 702-27-1952  PHYSICAL THERAPY DISCHARGE SUMMARY  Visits from Start of Care: 6  Current functional level related to goals / functional outcomes: See above clinical impression   Remaining deficits: Limited thoracic extension ROM, decreased B shoulder IR strength, pain   Education / Equipment: HEP  Plan: Patient agrees to discharge.  Patient goals were partially met. Patient is being discharged due to the patient's request.  ?????     YJanene Harvey PT, DPT 07/28/20 11:48 AM

## 2020-08-08 ENCOUNTER — Ambulatory Visit
Admission: RE | Admit: 2020-08-08 | Discharge: 2020-08-08 | Disposition: A | Discharge status: Home / Self Care | Payer: Federal, State, Local not specified - PPO | Source: Ambulatory Visit | Attending: Physician Assistant | Admitting: Physician Assistant

## 2020-08-08 DIAGNOSIS — R109 Unspecified abdominal pain: Secondary | ICD-10-CM

## 2020-08-08 MED ORDER — IOPAMIDOL (ISOVUE-300) INJECTION 61%
100.0000 mL | Freq: Once | INTRAVENOUS | Status: AC | PRN
  Administered 2020-08-08: 100 mL via INTRAVENOUS

## 2020-08-08 NOTE — Progress Notes (Signed)
69 y.o. [name] Divorced [place] American female here for annual exam.    Patient complaining of "knot" in left calf. Onset 3 days ago after standing for a long period of time.   Had a CT scan for back pain and elevated amylase.  She is awaiting results.  Her tail bone is sore.   Some constipation.   No bladder control problems.   Just retired.   Received her Covid vaccine and finished the end of February.   PCP:  Margaret Axe, MD  No LMP recorded. Patient has had a hysterectomy.           Sexually active: No.  The current method of family planning is status post hysterectomy.    Exercising: Yes.    walks 45 minutes/day Smoker:  no  Health Maintenance: Pap:  Years ago prior to hysterectomy  History of abnormal Pap: Yes, 1982 Hx of a conization of cervix MMG:  11-03-19  3D/Neg/density B/BiRads1 Colonoscopy:  10/25/17 History of polyp repeat in 3-5 years BMD: 08/02/20  Result :results pend.with PCP TDaP: PCP--?2018 Gardasil:   no HIV: 06-11-17 NR Hep C: 06-11-17 Neg Screening Labs:  PCP.  Flu vaccine:  Completed. Shingles:  ?   reports that she has never smoked. She has never used smokeless tobacco. She reports current alcohol use. She reports that she does not use drugs.  Past Medical History:  Diagnosis Date  . Allergy   . Anemia   . Colitis   . Dyspareunia   . Endometriosis   . GERD (gastroesophageal reflux disease)   . High cholesterol   . Hx of abnormal Pap smear 1980's  . Hypertension   . Osteopenia   . Ulcer   . Urinary incontinence     Past Surgical History:  Procedure Laterality Date  . ABDOMINAL HYSTERECTOMY  09/2008   TVH/BSO--Dr. Quincy [name] with TVT  . BLADDER SUSPENSION  09/2008   Dr. Quincy [name]  . CERVIX LESION DESTRUCTION  1980   hx abnormal pap--dysplasia  . COLONOSCOPY WITH PROPOFOL Left 10/25/2017   Procedure: COLONOSCOPY WITH PROPOFOL;  Surgeon: Margaret Juniper, MD;  Location: [facility];  Service: Gastroenterology;  Laterality: Left;    Current  Outpatient Medications  Medication Sig Dispense Refill  . Ascorbic Acid (VITAMIN C) 1000 MG tablet Take 1,000 mg by mouth daily.    Marland Kitchen atorvastatin (LIPITOR) 10 MG tablet Take 10 mg by mouth daily.    . Cholecalciferol (VITAMIN D) 2000 UNITS CAPS Take 2,000 Units by mouth daily.     . Ferrous Sulfate (IRON) 325 (65 Fe) MG TABS Take 325 mg by mouth daily.     . fluticasone (FLONASE) 50 MCG/ACT nasal spray Place 1 spray into both nostrils daily as needed for allergies or rhinitis.     Marland Kitchen lisinopril-hydrochlorothiazide (PRINZIDE,ZESTORETIC) 10-12.5 MG per tablet Take 1 tablet by mouth daily.  6  . Multiple Vitamins-Minerals (MULTIVITAMIN ADULT PO) Take 1 tablet by mouth daily.    Marland Kitchen omeprazole (PRILOSEC) 40 MG capsule Take 40 mg by mouth daily.    Marland Kitchen zinc gluconate 50 MG tablet Take 50 mg by mouth daily.     No current facility-administered medications for this visit.    Family History  Problem Relation Age of Onset  . Diabetes Mother   . Hypertension Mother   . Thyroid disease Sister   . Seizures Brother   . Diabetes Brother   . Hypertension Brother   . Diabetes Brother   . Arthritis Other   . Diabetes Other   .  Breast cancer Cousin     Review of Systems  All other systems reviewed and are negative.   Exam:   BP 130/76   Pulse 88   Resp 14   Ht 5' 5.5" (1.664 m)   Wt 167 lb (75.8 kg)   BMI 27.37 kg/m     General appearance: alert, cooperative and appears stated age Head: normocephalic, without obvious abnormality, atraumatic Neck: no adenopathy, supple, symmetrical, trachea midline and thyroid normal to inspection and palpation Lungs: clear to auscultation bilaterally Breasts: normal appearance, no masses or tenderness, No nipple retraction or dimpling, No nipple discharge or bleeding, No axillary adenopathy Heart: regular rate and rhythm Abdomen: soft, non-tender; no masses, no organomegaly Extremities: extremities normal, atraumatic, no cyanosis or edema.  Left lateral  calf with 1.5 cm soft, tender mass.  Positive Homan's. Skin: skin color, texture, turgor normal. No rashes or lesions Lymph nodes: cervical, supraclavicular, and axillary nodes normal. Neurologic: grossly normal  Pelvic: External genitalia:  no lesions              No abnormal inguinal nodes palpated.              Urethra:  normal appearing urethra with no masses, tenderness or lesions              Bartholins and Skenes: normal                 Vagina: normal appearing vagina with normal color and discharge, no lesions              Cervix: absent              Pap taken: No. Bimanual Exam:  Uterus:  absent              Adnexa: no mass, fullness, tenderness              Rectal exam: Yes.  .  Confirms.              Anus:  normal sphincter tone, no lesions  Chaperone was present for exam.  Assessment:   Well woman visit with normal exam. Status post TVH/BSO/TVT.  Status post anterior colporrhaphy with biological mesh. Vaginal atrophy.  Back pain and elevated amylase.  Status post CT. HTN. Prediabetic. Left calf small mass and pain.  Phlebitis versus DVT.  Plan: Mammogram screening discussed. Self breast awareness reviewed. Pap and HR HPV as above. Guidelines for Calcium, Vitamin D, regular exercise program including cardiovascular and weight bearing exercise. Will schedule doppler US of left leg.  Follow up annually and prn.   After visit summary provided.

## 2020-08-09 ENCOUNTER — Encounter: Payer: Self-pay | Admitting: Obstetrics and Gynecology

## 2020-08-09 ENCOUNTER — Ambulatory Visit (INDEPENDENT_AMBULATORY_CARE_PROVIDER_SITE_OTHER): Payer: Federal, State, Local not specified - PPO | Admitting: Obstetrics and Gynecology

## 2020-08-09 ENCOUNTER — Ambulatory Visit (HOSPITAL_COMMUNITY)
Admission: RE | Admit: 2020-08-09 | Discharge: 2020-08-09 | Disposition: A | Discharge status: Home / Self Care | Payer: Federal, State, Local not specified - PPO | Source: Ambulatory Visit | Attending: Obstetrics and Gynecology | Admitting: Obstetrics and Gynecology

## 2020-08-09 ENCOUNTER — Other Ambulatory Visit: Payer: Self-pay

## 2020-08-09 ENCOUNTER — Telehealth: Payer: Self-pay | Admitting: *Deleted

## 2020-08-09 VITALS — BP 130/76 | HR 88 | Resp 14 | Ht 65.5 in | Wt 167.0 lb

## 2020-08-09 DIAGNOSIS — M79662 Pain in left lower leg: Secondary | ICD-10-CM | POA: Insufficient documentation

## 2020-08-09 DIAGNOSIS — Z01419 Encounter for gynecological examination (general) (routine) without abnormal findings: Secondary | ICD-10-CM | POA: Diagnosis not present

## 2020-08-09 NOTE — Patient Instructions (Signed)

## 2020-08-09 NOTE — Progress Notes (Signed)
Patient scheduled while in office for left lower extremity vascular US to r/o DVT. Spoke with Elysian, patient scheduled for today at 4pm at [facility], arrive at 3:45pm. Check in at entrance "C". Patient verbalzies understanding and is agreeable.

## 2020-08-09 NOTE — Progress Notes (Signed)
Left lower extremity venous duplex has been completed. Preliminary results can be found in CV Proc through chart review.  Results were given to [facility] at Dr. Elza Rafter office.  08/09/20 4:15 PM Carlos Levering RVT

## 2020-08-09 NOTE — Telephone Encounter (Signed)
Incoming call from [place] at [facility].  [place] Ultrasound of left lower extremity negative for DVT.  Preliminary report will be available in Epic.   Routing to Dr. Quincy Simmonds

## 2020-08-10 NOTE — Telephone Encounter (Signed)
See result note also. Please inform patient of preliminary result which is normal. I do recommend heat, Tylenol, and follow up with her PCP if the area continues to bother her.

## 2020-08-10 NOTE — Telephone Encounter (Signed)
Burnice Logan, RN  08/10/2020 1:24 PM EDT Back to Top    Spoke with patient, advised per Dr. Quincy Simmonds. Patient verbalizes understanding and is agreeable.    Encounter closed.

## 2020-08-14 ENCOUNTER — Other Ambulatory Visit: Payer: Federal, State, Local not specified - PPO

## 2020-08-16 ENCOUNTER — Other Ambulatory Visit: Payer: Federal, State, Local not specified - PPO

## 2020-08-16 DIAGNOSIS — Z20822 Contact with and (suspected) exposure to covid-19: Secondary | ICD-10-CM

## 2020-08-17 LAB — SARS-COV-2, NAA 2 DAY TAT

## 2020-08-17 LAB — NOVEL CORONAVIRUS, NAA: SARS-CoV-2, NAA: NOT DETECTED

## 2020-09-20 ENCOUNTER — Ambulatory Visit: Payer: Federal, State, Local not specified - PPO | Admitting: Podiatry

## 2020-09-20 ENCOUNTER — Ambulatory Visit (INDEPENDENT_AMBULATORY_CARE_PROVIDER_SITE_OTHER): Payer: Federal, State, Local not specified - PPO

## 2020-09-20 ENCOUNTER — Other Ambulatory Visit: Payer: Self-pay

## 2020-09-20 DIAGNOSIS — S9002XA Contusion of left ankle, initial encounter: Secondary | ICD-10-CM | POA: Diagnosis not present

## 2020-09-20 DIAGNOSIS — M7661 Achilles tendinitis, right leg: Secondary | ICD-10-CM

## 2020-09-20 DIAGNOSIS — M7672 Peroneal tendinitis, left leg: Secondary | ICD-10-CM

## 2020-09-20 DIAGNOSIS — M7662 Achilles tendinitis, left leg: Secondary | ICD-10-CM

## 2020-09-20 MED ORDER — METHYLPREDNISOLONE 4 MG PO TBPK: ORAL_TABLET | ORAL | 0 refills | Status: DC

## 2020-09-20 MED ORDER — MELOXICAM 15 MG PO TABS: 15.0000 mg | ORAL_TABLET | Freq: Every day | ORAL | 1 refills | Status: DC

## 2020-09-20 NOTE — Progress Notes (Signed)
   HPI: 69 y.o. female presenting today for evaluation of left foot and ankle pain.  Pain is been present for approximately 3-6 months now.  Patient denies a history of injury.  She states that the pain was aggravated by walking.  She has not done anything for treatment.  Past Medical History:  Diagnosis Date  . Allergy   . Anemia   . Colitis   . Dyspareunia   . Endometriosis   . GERD (gastroesophageal reflux disease)   . High cholesterol   . Hx of abnormal Pap smear 1980's  . Hypertension   . Osteopenia   . Ulcer   . Urinary incontinence       Physical Exam: General: The patient is alert and oriented x3 in no acute distress.  Dermatology: Skin is warm, dry and supple bilateral lower extremities. Negative for open lesions or macerations.  Vascular: Palpable pedal pulses bilaterally. No edema or erythema noted. Capillary refill within normal limits.  Neurological: Epicritic and protective threshold grossly intact bilaterally.   Musculoskeletal Exam: Pain on palpation noted to the mid substance of the Achilles tendon approximately 6-8 cm proximal to the insertion of the calcaneus.  There is also some pain on palpation along the peroneal tendon as it traverses posteriorly to the lateral malleolus. Range of motion within normal limits. Muscle strength 5/5 in all muscle groups bilateral lower extremities.  Radiographic exam: Normal osseous mineralization.  Joint spaces preserved.  No fracture identified.  Otherwise normal exam  Assessment: 1. Insertional Achilles tendinitis left 2.  Peroneal tendinitis left   Plan of Care:  1. Patient was evaluated. Radiographs were reviewed today. 2. Injection of 0.5 mL Celestone Soluspan injected into the proximal portion of the Achilles tendon.  Injection of 0.5 cc Celestone Soluspan also injected along the peroneal tendon sheath just posterior to the lateral malleolus 3.  Prescription for Medrol Dosepak 4.  Prescription for meloxicam 15 mg  daily 5.  Cam boot dispensed.  Weightbearing as tolerated x4 weeks 6.  Return to clinic in 4 weeks   Edrick Kins, DPM [facility]  Dr. Edrick Kins, [address]                                        [place], [place] [address]                Office [phone]  Fax [phone]

## 2020-09-20 NOTE — Patient Instructions (Signed)
He remembered message this

## 2020-10-19 ENCOUNTER — Encounter (HOSPITAL_BASED_OUTPATIENT_CLINIC_OR_DEPARTMENT_OTHER): Payer: Self-pay | Admitting: Emergency Medicine

## 2020-10-19 ENCOUNTER — Emergency Department (HOSPITAL_BASED_OUTPATIENT_CLINIC_OR_DEPARTMENT_OTHER): Payer: Federal, State, Local not specified - PPO

## 2020-10-19 ENCOUNTER — Other Ambulatory Visit: Payer: Self-pay

## 2020-10-19 ENCOUNTER — Emergency Department (HOSPITAL_BASED_OUTPATIENT_CLINIC_OR_DEPARTMENT_OTHER)
Admission: EM | Admit: 2020-10-19 | Discharge: 2020-10-19 | Disposition: A | Discharge status: Home / Self Care | Payer: Federal, State, Local not specified - PPO | Attending: Emergency Medicine | Admitting: Emergency Medicine

## 2020-10-19 DIAGNOSIS — Z79899 Other long term (current) drug therapy: Secondary | ICD-10-CM | POA: Insufficient documentation

## 2020-10-19 DIAGNOSIS — R079 Chest pain, unspecified: Secondary | ICD-10-CM | POA: Diagnosis present

## 2020-10-19 DIAGNOSIS — I10 Essential (primary) hypertension: Secondary | ICD-10-CM | POA: Insufficient documentation

## 2020-10-19 LAB — BASIC METABOLIC PANEL
Anion gap: 9 (ref 5–15)
BUN: 12 mg/dL (ref 8–23)
CO2: 30 mmol/L (ref 22–32)
Calcium: 9.2 mg/dL (ref 8.9–10.3)
Chloride: 99 mmol/L (ref 98–111)
Creatinine, Ser: 0.77 mg/dL (ref 0.44–1.00)
GFR, Estimated: >60 mL/min (ref >60)
Glucose, Bld: 112 mg/dL — ABNORMAL HIGH (ref 70–99)
Potassium: 3.6 mmol/L (ref 3.5–5.1)
Sodium: 138 mmol/L (ref 135–145)

## 2020-10-19 LAB — CBC
HCT: 36.9 % (ref 36.0–46.0)
Hemoglobin: 11.5 g/dL — ABNORMAL LOW (ref 12.0–15.0)
MCH: 26.6 pg (ref 26.0–34.0)
MCHC: 31.2 g/dL (ref 30.0–36.0)
MCV: 85.2 fL (ref 80.0–100.0)
Platelets: 281 10*3/uL (ref 150–400)
RBC: 4.33 MIL/uL (ref 3.87–5.11)
RDW: 15.5 % (ref 11.5–15.5)
WBC: 5.9 10*3/uL (ref 4.0–10.5)
nRBC: 0 % (ref 0.0–0.2)

## 2020-10-19 LAB — TROPONIN I (HIGH SENSITIVITY): Troponin I (High Sensitivity): 3 ng/L (ref <18)

## 2020-10-19 MED ORDER — CYCLOBENZAPRINE HCL 10 MG PO TABS: 10.0000 mg | ORAL_TABLET | Freq: Three times a day (TID) | ORAL | 0 refills | Status: DC | PRN

## 2020-10-19 NOTE — ED Provider Notes (Signed)
[facility] EMERGENCY DEPARTMENT Provider Note   CSN: [id number] Arrival date & time: 10/19/20  9381     History Chief Complaint  Patient presents with  . Chest Pain    Margaret [name] is a 69 y.o. female.  Presents to the emergency room with concern for chest pain.  Patient reports she started having chest pain sometime yesterday, worse on right side, worse with certain positions, describes pain as aching, cramping sensation.  At times radiates to back.  Similar to prior episodes when she was diagnosed with costochondritis.  No shortness of breath associated with this.  Pain is currently mild.  Not associated with exertion.  Per chart review no known history of coronary artery disease.  Has been seen by cardiology for her atypical chest pain.  HPI     Past Medical History:  Diagnosis Date  . Allergy   . Anemia   . Colitis   . Dyspareunia   . Endometriosis   . GERD (gastroesophageal reflux disease)   . High cholesterol   . Hx of abnormal Pap smear 1980's  . Hypertension   . Osteopenia   . Ulcer   . Urinary incontinence     Patient Active Problem List   Diagnosis Date Noted  . Gastroesophageal reflux disease 06/06/2020  . Atypical chest pain 04/21/2020  . Closed nondisplaced fracture of proximal phalanx of lesser toe of left foot 11/19/2017  . GI bleed 10/23/2017  . Hypokalemia 10/23/2017  . Nausea vomiting and diarrhea 10/23/2017  . Hematochezia 10/23/2017  . 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 Back pain 04/12/2014  . Arthritis 04/12/2014  . Knee pain 04/12/2014  . Essential hypertension 11/27/2013  . Osteoarthritis of left knee 07/16/2012    Past Surgical History:  Procedure Laterality Date  . ABDOMINAL HYSTERECTOMY  09/2008   TVH/BSO--Dr. Quincy Simmonds with TVT  . BLADDER SUSPENSION  09/2008   Dr. Quincy Simmonds  . CERVIX LESION DESTRUCTION  1980   hx abnormal pap--dysplasia  . COLONOSCOPY WITH PROPOFOL Left 10/25/2017    Procedure: COLONOSCOPY WITH PROPOFOL;  Surgeon: Ronnette Juniper, MD;  Location: [facility];  Service: Gastroenterology;  Laterality: Left;     OB History    Gravida  3   Para  3   Term  3   Preterm      AB      Living  3     SAB      IAB      Ectopic      Multiple      Live Births              Family History  Problem Relation Age of Onset  . Diabetes Mother   . Hypertension Mother   . Thyroid disease Sister   . Seizures Brother   . Diabetes Brother   . Hypertension Brother   . Diabetes Brother   . Arthritis Other   . Diabetes Other   . Breast cancer Cousin     Social History   Tobacco Use  . Smoking status: Never Smoker  . Smokeless tobacco: Never Used  Vaping Use  . Vaping Use: Never used  Substance Use Topics  . Alcohol use: Yes    Comment: rare  . Drug use: No    Home Medications Prior to Admission medications   Medication Sig Start Date End Date Taking? Authorizing Provider  Ascorbic Acid (VITAMIN C) 1000 MG tablet Take 1,000 mg by mouth daily.    [provider]  atorvastatin (LIPITOR) 10 MG tablet Take 10 mg by mouth daily.    [provider]  Cholecalciferol (VITAMIN D) 2000 UNITS CAPS Take 2,000 Units by mouth daily.     [provider]  cyclobenzaprine (FLEXERIL) 10 MG tablet Take 1 tablet (10 mg total) by mouth 3 (three) times daily as needed for muscle spasms. 10/19/20   Lucrezia Starch, MD  Ferrous Sulfate (IRON) 325 (65 Fe) MG TABS Take 325 mg by mouth daily.     [provider]  fluticasone (FLONASE) 50 MCG/ACT nasal spray Place 1 spray into both nostrils daily as needed for allergies or rhinitis.     [provider]  lisinopril-hydrochlorothiazide (PRINZIDE,ZESTORETIC) 10-12.5 MG per tablet Take 1 tablet by mouth daily. 05/05/15   [provider]  meloxicam (MOBIC) 15 MG tablet Take 1 tablet (15 mg total) by mouth daily. 09/20/20   Edrick Kins, DPM  methylPREDNISolone (MEDROL  DOSEPAK) 4 MG TBPK tablet 6 day dose pack - take as directed 09/20/20   Edrick Kins, DPM  Multiple Vitamins-Minerals (MULTIVITAMIN ADULT PO) Take 1 tablet by mouth daily.    [provider]  omeprazole (PRILOSEC) 40 MG capsule Take 40 mg by mouth daily.    [provider]  zinc gluconate 50 MG tablet Take 50 mg by mouth daily.    [provider]    Allergies    Amoxicillin-pot clavulanate, Ciprofloxacin, Levofloxacin, Macrobid [nitrofurantoin], Pantoprazole sodium, and Sulfa antibiotics  Review of Systems   Review of Systems  Constitutional: Negative for chills and fever.  HENT: Negative for ear pain and sore throat.   Eyes: Negative for pain and visual disturbance.  Respiratory: Positive for chest tightness. Negative for cough and shortness of breath.   Cardiovascular: Positive for chest pain. Negative for palpitations.  Gastrointestinal: Negative for abdominal pain and vomiting.  Genitourinary: Negative for dysuria and hematuria.  Musculoskeletal: Negative for arthralgias and back pain.  Skin: Negative for color change and rash.  Neurological: Negative for seizures and syncope.  All other systems reviewed and are negative.   Physical Exam Updated Vital Signs BP 119/66   Pulse 70   Temp 98.7 F (37.1 C)   Resp 13   Ht 5\' 6"  (1.676 m)   Wt 76.2 kg   SpO2 98%   BMI 27.12 kg/m   Physical Exam Vitals and nursing note reviewed.  Constitutional:      General: She is not in acute distress.    Appearance: She is well-developed and well-nourished.  HENT:     Head: Normocephalic and atraumatic.  Eyes:     Conjunctiva/sclera: Conjunctivae normal.  Cardiovascular:     Rate and Rhythm: Normal rate and regular rhythm.     Heart sounds: No murmur heard.   Pulmonary:     Effort: Pulmonary effort is normal. No respiratory distress.     Breath sounds: Normal breath sounds.  Chest:     Comments: ttp over anterior right chest wall Abdominal:      Palpations: Abdomen is soft.     Tenderness: There is no abdominal tenderness.  Musculoskeletal:        General: No edema.     Cervical back: Neck supple.     Right lower leg: No edema.     Left lower leg: No edema.  Skin:    General: Skin is warm and dry.     Capillary Refill: Capillary refill takes less than 2 seconds.  Neurological:  General: No focal deficit present.     Mental Status: She is alert and oriented to person, place, and time.  Psychiatric:        Mood and Affect: Mood and affect normal.     ED Results / Procedures / Treatments   Labs (all labs ordered are listed, but only abnormal results are displayed) Labs Reviewed  BASIC METABOLIC PANEL - Abnormal; Notable for the following components:      Result Value   Glucose, Bld 112 (*)    All other components within normal limits  CBC - Abnormal; Notable for the following components:   Hemoglobin 11.5 (*)    All other components within normal limits  TROPONIN I (HIGH SENSITIVITY)  TROPONIN I (HIGH SENSITIVITY)    EKG EKG Interpretation  Date/Time:  Thursday October 19 2020 08:28:12 EST Ventricular Rate:  74 PR Interval:    QRS Duration: 94 QT Interval:  414 QTC Calculation: 460 R Axis:   74 Text Interpretation: Sinus rhythm Confirmed by Madalyn Rob [phone]) on 10/19/2020 9:35:10 AM   Radiology DG Chest 2 View  Result Date: 10/19/2020 CLINICAL DATA:  Chest pain today. EXAM: CHEST - 2 VIEW COMPARISON:  05/22/2020 FINDINGS: The cardiac silhouette, mediastinal hilar contours are within normal limits and stable. The lungs are clear. No pleural effusions or pulmonary nodules. The bony thorax is intact. IMPRESSION: No acute cardiopulmonary findings. Electronically Signed   By: Marijo Sanes M.D.   On: 10/19/2020 09:15    Procedures Procedures (including critical care time)  Medications Ordered in ED Medications - No data to display  ED Course  I have reviewed the triage vital signs and the nursing  notes.  Pertinent labs & imaging results that were available during my care of the patient were reviewed by me and considered in my medical decision making (see chart for details).    MDM Rules/Calculators/A&P                          69 year old lady presents to ER with concern for chest pain.  On exam patient noted to be remarkably well-appearing in no distress with normal vital signs.  EKG without acute ischemic changes, troponin within normal limits.  Based on description of symptoms and a forementioned findings, very low suspicion for ACS.  CXR negative.  Remainder of labs grossly stable.  Suspect most likely MSK in nature.  Recommend she follow-up with her primary doctor and cardiologist.  Reviewed return precautions and discharged home.  After the discussed management above, the patient was determined to be safe for discharge.  The patient was in agreement with this plan and all questions regarding their care were answered.  ED return precautions were discussed and the patient will return to the ED with any significant worsening of condition.    Final Clinical Impression(s) / ED Diagnoses Final diagnoses:  Chest pain, unspecified type    Rx / DC Orders ED Discharge Orders         Ordered    cyclobenzaprine (FLEXERIL) 10 MG tablet  3 times daily PRN        10/19/20 0947           Lucrezia Starch, MD 10/19/20 1026

## 2020-10-19 NOTE — Discharge Instructions (Signed)
Follow up with your PCP and cardiologist. Return for worsening chest pain or difficulty in breathing or other new concerning symptom.

## 2020-10-19 NOTE — ED Triage Notes (Signed)
Chest pain since yesterday.  More to right side.  Pain radiates to back.  Pain increases with movement and palpation.  Some sob.  Some diaphoresis and lethargy.  No N/V.

## 2020-10-23 ENCOUNTER — Ambulatory Visit: Payer: Federal, State, Local not specified - PPO | Admitting: Podiatry

## 2020-10-23 ENCOUNTER — Other Ambulatory Visit: Payer: Self-pay

## 2020-10-23 DIAGNOSIS — M7662 Achilles tendinitis, left leg: Secondary | ICD-10-CM

## 2020-10-23 DIAGNOSIS — M7672 Peroneal tendinitis, left leg: Secondary | ICD-10-CM

## 2020-10-26 ENCOUNTER — Telehealth: Payer: Self-pay

## 2020-10-26 NOTE — Telephone Encounter (Signed)
Agree 

## 2020-10-26 NOTE — Telephone Encounter (Signed)
[id number] ED visit summary to schedule pt. Called and she states that heart related issues were ruled out ans she is seeing someone for symptoms and does not need appt.//ah

## 2020-10-29 NOTE — Progress Notes (Signed)
   HPI: 69 y.o. female presenting today for follow-up evaluation regarding Achilles tendinitis and peroneal tendinitis to the left lower extremity.  Patient states that she is significantly improved but still having some mild pain.  She is not wearing the boot today.  She states that the cam boot with the Flexeril and anti-inflammatory injection helped significantly.  No new complaints at this time  Past Medical History:  Diagnosis Date  . Allergy   . Anemia   . Colitis   . Dyspareunia   . Endometriosis   . GERD (gastroesophageal reflux disease)   . High cholesterol   . Hx of abnormal Pap smear 1980's  . Hypertension   . Osteopenia   . Ulcer   . Urinary incontinence       Physical Exam: General: The patient is alert and oriented x3 in no acute distress.  Dermatology: Skin is warm, dry and supple bilateral lower extremities. Negative for open lesions or macerations.  Vascular: Palpable pedal pulses bilaterally. No edema or erythema noted. Capillary refill within normal limits.  Neurological: Epicritic and protective threshold grossly intact bilaterally.   Musculoskeletal Exam: Minimal pain on palpation noted to the mid substance of the Achilles tendon approximately 6-8 cm proximal to the insertion of the calcaneus.  There is also some minimal pain on palpation along the peroneal tendon as it traverses posteriorly to the lateral malleolus. Range of motion within normal limits. Muscle strength 5/5 in all muscle groups bilateral lower extremities.  Assessment: 1. Insertional Achilles tendinitis left 2.  Peroneal tendinitis left   Plan of Care:  1. Patient was evaluated. 2.  Patient declined injection today 3.  Discontinue cam boot.  Ankle brace dispensed.  Wear daily. 4.  Continue meloxicam as needed 5.  Return to clinic as needed.  If the patient has a flareup and needs an injection  Edrick Kins, DPM [facility]  Dr. Edrick Kins, [address]                                        [place], [place] [address]                Office [phone]  Fax [phone]

## 2020-11-15 ENCOUNTER — Ambulatory Visit: Payer: Federal, State, Local not specified - PPO

## 2020-11-16 ENCOUNTER — Other Ambulatory Visit: Payer: Self-pay

## 2020-11-16 ENCOUNTER — Ambulatory Visit
Admission: RE | Admit: 2020-11-16 | Discharge: 2020-11-16 | Disposition: A | Discharge status: Home / Self Care | Payer: Federal, State, Local not specified - PPO | Source: Ambulatory Visit | Attending: Family Medicine | Admitting: Family Medicine

## 2020-11-16 DIAGNOSIS — Z1231 Encounter for screening mammogram for malignant neoplasm of breast: Secondary | ICD-10-CM

## 2021-04-04 ENCOUNTER — Ambulatory Visit: Payer: Medicare Other | Attending: Orthopaedic Surgery | Admitting: Physical Therapy

## 2021-04-04 ENCOUNTER — Other Ambulatory Visit: Payer: Self-pay

## 2021-04-04 ENCOUNTER — Encounter: Payer: Self-pay | Admitting: Physical Therapy

## 2021-04-04 DIAGNOSIS — M6281 Muscle weakness (generalized): Secondary | ICD-10-CM | POA: Diagnosis present

## 2021-04-04 DIAGNOSIS — M542 Cervicalgia: Secondary | ICD-10-CM | POA: Insufficient documentation

## 2021-04-04 DIAGNOSIS — R293 Abnormal posture: Secondary | ICD-10-CM | POA: Diagnosis present

## 2021-04-04 DIAGNOSIS — M546 Pain in thoracic spine: Secondary | ICD-10-CM | POA: Diagnosis present

## 2021-04-04 DIAGNOSIS — R252 Cramp and spasm: Secondary | ICD-10-CM | POA: Insufficient documentation

## 2021-04-04 NOTE — Therapy (Signed)
[place]. [place], [place], [address] Phone: [phone]   Fax:  [phone]  Physical Therapy Evaluation  Patient Details  Name: Margaret [name] MRN: [id number] Date of Birth: 08/31/1951 Referring Provider (PT): Patrice Paradise   Encounter Date: 04/04/2021   PT End of Session - 04/04/21 1131    Visit Number 1    Date for PT Re-Evaluation 07/05/21    Authorization Type Medicare    PT Start Time 1100    PT Stop Time 1150    PT Time Calculation (min) 50 min    Activity Tolerance Patient tolerated treatment well    Behavior During Therapy [facility] for tasks assessed/performed           Past Medical History:  Diagnosis Date  . Allergy   . Anemia   . Colitis   . Dyspareunia   . Endometriosis   . GERD (gastroesophageal reflux disease)   . High cholesterol   . Hx of abnormal Pap smear 1980's  . Hypertension   . Osteopenia   . Ulcer   . Urinary incontinence     Past Surgical History:  Procedure Laterality Date  . ABDOMINAL HYSTERECTOMY  09/2008   TVH/BSO--Dr. Quincy Simmonds with TVT  . BLADDER SUSPENSION  09/2008   Dr. Quincy Simmonds  . CERVIX LESION DESTRUCTION  1980   hx abnormal pap--dysplasia  . COLONOSCOPY WITH PROPOFOL Left 10/25/2017   Procedure: COLONOSCOPY WITH PROPOFOL;  Surgeon: Ronnette Juniper, MD;  Location: [facility];  Service: Gastroenterology;  Laterality: Left;    There were no vitals filed for this visit.    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  She has retired, reports that she has had some tingling and weakness in the arms.  She reports that the MD talked about surgery.  She reports that she does not want surgery.  Had some good relief with [place] in the past    Limitations House hold  activities    Patient Stated Goals have less pain, better ROM    Currently in Pain? Yes    Pain Score 5     Pain Location Neck    Pain Orientation Right;Left    Pain Descriptors / Indicators Tingling;Aching;Sore    Pain Type Chronic pain    Pain Radiating Towards some into both arms    Pain Onset More than a month ago    Pain Frequency Constant    Aggravating Factors  activity, lifting, pain can be up to 7/10, being on the computer    Pain Relieving Factors use better pillow, move, pain can be down to 3/10    Effect of Pain on Daily Activities difficulty, just hurts all the time              [facility] PT Assessment - 04/04/21 0001      Assessment   Medical Diagnosis cervicalgia, radiculopathy    Referring Provider (PT) Cohen    Onset Date/Surgical Date 03/05/21    Hand Dominance Right    Prior Therapy over a year ago      Precautions   Precautions None      Balance Screen   Has the patient fallen in the past 6 months No    Is the patient reluctant to leave their home  because of a fear of falling?  No      Home Environment   Additional Comments does housework, light yardwork and gardening, just got a small dog      Prior Function   Level of Independence Independent    Vocation Retired    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        [facility] Adult PT Treatment/Exercise - 04/04/21 0001      Modalities   Modalities Electrical Stimulation;Moist Heat      Moist Heat Therapy   Number Minutes Moist Heat 10 Minutes    Moist Heat Location Cervical      Electrical Stimulation   Electrical Stimulation Location C/T area    Electrical Stimulation Action IFC    Electrical Stimulation Parameters supine    Electrical Stimulation Goals Pain                    PT Short Term Goals - 04/04/21 1134      PT SHORT TERM GOAL #1   Title independent with initial HEP    Time 3    Period Weeks    Status New             PT Long Term Goals - 04/04/21 1134      PT LONG TERM GOAL #1   Title Patient to be independent with advanced HEP.    Time 12    Period Weeks    Status New      PT LONG TERM GOAL #2   Title increase cervical ROM to WNL's    Time 12    Period Weeks    Status New      PT LONG TERM GOAL #3   Title Patient to demonstrate B shoulder strength >/=4+/5.    Time 12    Period Weeks    Status New      PT LONG TERM GOAL #4   Title report =/> 75% reduction in overall pain of neck and chest with daily activity.    Time 12    Period Weeks    Status New      PT LONG TERM GOAL #5   Title Pt will be able to walk for fitness 20 min at a time without increasing pain.     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  She has some tingling at times in the arms, she recently retired, has a new Environmental health practitioner.  Has loss of cervical ROM, tightness with shoulder ROM, mild strength loss, has spasms in the upper traps the rhomboids and the cervical pspinals    Stability/Clinical Decision Making Stable/Uncomplicated    Clinical Decision Making Low    Rehab Potential Good    PT Frequency 2x / week    PT Duration 12 weeks    PT  Treatment/Interventions ADLs/Self Care Home Management;Electrical Stimulation;Moist Heat;Iontophoresis 4mg /ml Dexamethasone;Traction;Therapeutic exercise;Therapeutic activities;Neuromuscular re-education;Manual techniques;Patient/family education;Dry needling    PT Next Visit Plan may try traction, start stability exrcises    Consulted and Agree with Plan of Care Patient           Patient will benefit from skilled therapeutic intervention in order to improve the following deficits and impairments:  Decreased range of motion,Increased muscle spasms,Pain,Improper body mechanics,Decreased strength,Postural dysfunction  Visit Diagnosis: Cervicalgia - Plan: PT plan of care cert/re-cert  Pain in thoracic spine - Plan: PT plan of care cert/re-cert  Cramp and spasm - Plan: PT plan of care cert/re-cert  Muscle weakness (generalized) - Plan: PT plan of care cert/re-cert  Abnormal posture - Plan: PT plan of care cert/re-cert     Problem List Patient Active Problem List   Diagnosis Date Noted  . Gastroesophageal reflux disease 06/06/2020  . Atypical chest pain 04/21/2020  . Closed nondisplaced fracture of proximal phalanx of lesser toe of left foot 11/19/2017  . GI bleed 10/23/2017  . Hypokalemia 10/23/2017  . Nausea vomiting and diarrhea 10/23/2017  . Hematochezia 10/23/2017  . 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 Back pain 04/12/2014  . Arthritis 04/12/2014  . Knee pain 04/12/2014  . Essential hypertension 11/27/2013  . Osteoarthritis of left knee 07/16/2012    Sumner Boast., PT 04/04/2021, 11:36 AM  [address]. [place], [place], [address] Phone: [phone]   Fax:  [phone]  Name: Candela Krul MRN: [id number] Date of Birth: 1951-04-18

## 2021-04-13 ENCOUNTER — Encounter: Payer: Self-pay | Admitting: Physical Therapy

## 2021-04-13 ENCOUNTER — Other Ambulatory Visit: Payer: Self-pay

## 2021-04-13 ENCOUNTER — Ambulatory Visit: Payer: Medicare Other | Attending: Orthopaedic Surgery | Admitting: Physical Therapy

## 2021-04-13 DIAGNOSIS — M546 Pain in thoracic spine: Secondary | ICD-10-CM | POA: Insufficient documentation

## 2021-04-13 DIAGNOSIS — M542 Cervicalgia: Secondary | ICD-10-CM | POA: Insufficient documentation

## 2021-04-13 DIAGNOSIS — M25511 Pain in right shoulder: Secondary | ICD-10-CM | POA: Diagnosis present

## 2021-04-13 DIAGNOSIS — M6281 Muscle weakness (generalized): Secondary | ICD-10-CM | POA: Insufficient documentation

## 2021-04-13 DIAGNOSIS — M25611 Stiffness of right shoulder, not elsewhere classified: Secondary | ICD-10-CM | POA: Insufficient documentation

## 2021-04-13 DIAGNOSIS — R252 Cramp and spasm: Secondary | ICD-10-CM | POA: Insufficient documentation

## 2021-04-13 DIAGNOSIS — R293 Abnormal posture: Secondary | ICD-10-CM | POA: Diagnosis present

## 2021-04-13 NOTE — Therapy (Signed)
[place]. [place], [place], [address] Phone: [phone]   Fax:  [phone]  Physical Therapy Treatment  Patient Details  Name: Margaret [name] MRN: [id number] Date of Birth: Jan 29, 1951 Referring Provider (PT): Veatrice Bourbon Date: 04/13/2021   PT End of Session - 04/13/21 0818    Visit Number 2    Date for PT Re-Evaluation 07/05/21    Authorization Type Medicare    PT Start Time 0800    PT Stop Time 0850    PT Time Calculation (min) 50 min    Activity Tolerance Patient tolerated treatment well    Behavior During Therapy [facility] for tasks assessed/performed           Past Medical History:  Diagnosis Date  . Allergy   . Anemia   . Colitis   . Dyspareunia   . Endometriosis   . GERD (gastroesophageal reflux disease)   . High cholesterol   . Hx of abnormal Pap smear 1980's  . Hypertension   . Osteopenia   . Ulcer   . Urinary incontinence     Past Surgical History:  Procedure Laterality Date  . ABDOMINAL HYSTERECTOMY  09/2008   TVH/BSO--Dr. Quincy Simmonds with TVT  . BLADDER SUSPENSION  09/2008   Dr. Quincy Simmonds  . CERVIX LESION DESTRUCTION  1980   hx abnormal pap--dysplasia  . COLONOSCOPY WITH PROPOFOL Left 10/25/2017   Procedure: COLONOSCOPY WITH PROPOFOL;  Surgeon: Ronnette Juniper, MD;  Location: [facility];  Service: Gastroenterology;  Laterality: Left;    There were no vitals filed for this visit.   Subjective Assessment - 04/13/21 0801    Subjective Patient reports stiff and tight at times    Currently in Pain? Yes    Pain Score 5     Pain Location Neck    Aggravating Factors  activity                             [facility] Adult PT Treatment/Exercise - 04/13/21 0001      Exercises   Exercises Neck      Neck Exercises: Machines for Strengthening   UBE (Upper Arm Bike) level 2 x 4 minutes      Neck Exercises: Theraband   Shoulder External Rotation 20 reps;Red       Neck Exercises: Standing   Neck Retraction 10 reps;3 secs    Other Standing Exercises weighted ball overhead reach, W backs, 5# shrugs with upper trap and levator stretches      Modalities   Modalities Traction      Moist Heat Therapy   Number Minutes Moist Heat 10 Minutes    Moist Heat Location Cervical      Electrical Stimulation   Electrical Stimulation Location C/T area    Electrical Stimulation Action IFC    Electrical Stimulation Parameters supine    Electrical Stimulation Goals Pain      Traction   Type of Traction Cervical    Max (lbs) 10    Hold Time static    Time 12                    PT Short Term Goals - 04/04/21 1134      PT SHORT TERM GOAL #1   Title independent with initial HEP    Time 3    Period Weeks    Status New  PT Long Term Goals - 04/04/21 1134      PT LONG TERM GOAL #1   Title Patient to be independent with advanced HEP.    Time 12    Period Weeks    Status New      PT LONG TERM GOAL #2   Title increase cervical ROM to WNL's    Time 12    Period Weeks    Status New      PT LONG TERM GOAL #3   Title Patient to demonstrate B shoulder strength >/=4+/5.    Time 12    Period Weeks    Status New      PT LONG TERM GOAL #4   Title report =/> 75% reduction in overall pain of neck and chest with daily activity.    Time 12    Period Weeks    Status New      PT LONG TERM GOAL #5   Title Pt will be able to walk for fitness 20 min at a time without increasing pain.     Time 12    Period Weeks    Status New                 Plan - 04/13/21 0818    Clinical Impression Statement Patient needs a lot of cues to relax the shoulders and not elevate and guard, she is very tight with over head shoulder motions needing some assist to get to end ranges.  I added traction today and started exercises see how she responds    PT Next Visit Plan see how she responds to the exercises and the traction, could issues HEP     Consulted and Agree with Plan of Care Patient           Patient will benefit from skilled therapeutic intervention in order to improve the following deficits and impairments:  Decreased range of motion,Increased muscle spasms,Pain,Improper body mechanics,Decreased strength,Postural dysfunction  Visit Diagnosis: Cervicalgia  Pain in thoracic spine  Cramp and spasm  Muscle weakness (generalized)  Abnormal posture  Stiffness of right shoulder, not elsewhere classified     Problem List Patient Active Problem List   Diagnosis Date Noted  . Gastroesophageal reflux disease 06/06/2020  . Atypical chest pain 04/21/2020  . Closed nondisplaced fracture of proximal phalanx of lesser toe of left foot 11/19/2017  . GI bleed 10/23/2017  . Hypokalemia 10/23/2017  . Nausea vomiting and diarrhea 10/23/2017  . Hematochezia 10/23/2017  . 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 Back pain 04/12/2014  . Arthritis 04/12/2014  . Knee pain 04/12/2014  . Essential hypertension 11/27/2013  . Osteoarthritis of left knee 07/16/2012    Sumner Boast., PT 04/13/2021, 8:20 AM  [address]. [place], [place], [address] Phone: [phone]   Fax:  [phone]  Name: Margaret [name] MRN: [id number] Date of Birth: Jan 21, 1951

## 2021-04-16 ENCOUNTER — Other Ambulatory Visit: Payer: Self-pay

## 2021-04-16 ENCOUNTER — Encounter: Payer: Self-pay | Admitting: Rehabilitative and Restorative Service Providers"

## 2021-04-16 ENCOUNTER — Ambulatory Visit: Payer: Medicare Other | Admitting: Rehabilitative and Restorative Service Providers"

## 2021-04-16 DIAGNOSIS — M25611 Stiffness of right shoulder, not elsewhere classified: Secondary | ICD-10-CM

## 2021-04-16 DIAGNOSIS — M542 Cervicalgia: Secondary | ICD-10-CM | POA: Diagnosis not present

## 2021-04-16 DIAGNOSIS — R252 Cramp and spasm: Secondary | ICD-10-CM

## 2021-04-16 DIAGNOSIS — M25511 Pain in right shoulder: Secondary | ICD-10-CM

## 2021-04-16 DIAGNOSIS — R293 Abnormal posture: Secondary | ICD-10-CM

## 2021-04-16 DIAGNOSIS — M6281 Muscle weakness (generalized): Secondary | ICD-10-CM

## 2021-04-16 DIAGNOSIS — M546 Pain in thoracic spine: Secondary | ICD-10-CM

## 2021-04-16 NOTE — Therapy (Signed)
[place]. [place], [place], [address] Phone: [phone]   Fax:  [phone]  Physical Therapy Treatment  Patient Details  Name: Margaret [name] MRN: [id number] Date of Birth: 1951-02-10 Referring Provider (PT): Veatrice Bourbon Date: 04/16/2021   PT End of Session - 04/16/21 1151    Visit Number 3    Date for PT Re-Evaluation 07/05/21    Authorization Type Medicare    PT Start Time 1145    PT Stop Time 1230    PT Time Calculation (min) 45 min    Activity Tolerance Patient tolerated treatment well    Behavior During Therapy [facility] for tasks assessed/performed           Past Medical History:  Diagnosis Date  . Allergy   . Anemia   . Colitis   . Dyspareunia   . Endometriosis   . GERD (gastroesophageal reflux disease)   . High cholesterol   . Hx of abnormal Pap smear 1980's  . Hypertension   . Osteopenia   . Ulcer   . Urinary incontinence     Past Surgical History:  Procedure Laterality Date  . ABDOMINAL HYSTERECTOMY  09/2008   TVH/BSO--Dr. Quincy Simmonds with TVT  . BLADDER SUSPENSION  09/2008   Dr. Quincy Simmonds  . CERVIX LESION DESTRUCTION  1980   hx abnormal pap--dysplasia  . COLONOSCOPY WITH PROPOFOL Left 10/25/2017   Procedure: COLONOSCOPY WITH PROPOFOL;  Surgeon: Ronnette Juniper, MD;  Location: [facility];  Service: Gastroenterology;  Laterality: Left;    There were no vitals filed for this visit.   Subjective Assessment - 04/16/21 1149    Subjective I was stiff and hurting yesterday, but I am feeling better today.    Patient Stated Goals have less pain, better ROM    Currently in Pain? Yes    Pain Score 4     Pain Location Neck    Pain Orientation Right;Left    Pain Descriptors / Indicators Aching;Tingling;Sore    Pain Type Chronic pain                             [facility] Adult PT Treatment/Exercise - 04/16/21 0001      Neck Exercises: Machines for Strengthening    UBE (Upper Arm Bike) L2.0.  70min fwd/3 backward      Neck Exercises: Theraband   Shoulder Extension 20 reps;Red    Shoulder External Rotation 20 reps;Red    Other Theraband Exercises Shoulder  2x10 reps red tband      Neck Exercises: Standing   Neck Retraction 20 reps;3 secs    Other Standing Exercises weighted ball overhead reach, W backs, 5# shrugs with upper trap and levator stretches    Other Standing Exercises Upper trap and levator stretch.  B side with 20 sec hold x3 each      Manual Therapy   Manual Therapy Soft tissue mobilization    Manual therapy comments seated    Soft tissue mobilization Manual traction to cervical spine.  STM/DTM to B LS, UT, rhomboids, scalenes                  PT Education - 04/16/21 1232    Education Details Issued HEP    Person(s) Educated Patient    Methods Explanation;Demonstration;Handout    Comprehension Verbalized understanding;Returned demonstration            PT Short Term Goals -  04/16/21 1240      PT SHORT TERM GOAL #1   Title independent with initial HEP    Status On-going             PT Long Term Goals - 04/16/21 1240      PT LONG TERM GOAL #1   Title Patient to be independent with advanced HEP.    Status On-going      PT LONG TERM GOAL #2   Title increase cervical ROM to WNL's    Status On-going      PT LONG TERM GOAL #3   Title Patient to demonstrate B shoulder strength >/=4+/5.    Status On-going      PT LONG TERM GOAL #4   Title report =/> 75% reduction in overall pain of neck and chest with daily activity.    Status On-going      PT LONG TERM GOAL #5   Title Pt will be able to walk for fitness 20 min at a time without increasing pain.     Status On-going                 Plan - 04/16/21 1237    Clinical Impression Statement Patient requires cuing for relaxing her shoulder, but is able to complete activities.  She continues with tight musculature, and reported relief with manual therapy  and manual traction.  With cervical retraction, pt reported a centralization of radicular symptoms.  She continues to require skilled PT to progress towards goal related activities.    PT Treatment/Interventions ADLs/Self Care Home Management;Electrical Stimulation;Moist Heat;Iontophoresis 4mg /ml Dexamethasone;Traction;Therapeutic exercise;Therapeutic activities;Neuromuscular re-education;Manual techniques;Patient/family education;Dry needling    PT Next Visit Plan strengthening, traction    PT Home Exercise Plan Access Code [email or web address]    Consulted and Agree with Plan of Care Patient           Patient will benefit from skilled therapeutic intervention in order to improve the following deficits and impairments:  Decreased range of motion,Increased muscle spasms,Pain,Improper body mechanics,Decreased strength,Postural dysfunction  Visit Diagnosis: Cervicalgia  Pain in thoracic spine  Cramp and spasm  Muscle weakness (generalized)  Abnormal posture  Stiffness of right shoulder, not elsewhere classified  Acute pain of right shoulder     Problem List Patient Active Problem List   Diagnosis Date Noted  . Gastroesophageal reflux disease 06/06/2020  . Atypical chest pain 04/21/2020  . Closed nondisplaced fracture of proximal phalanx of lesser toe of left foot 11/19/2017  . GI bleed 10/23/2017  . Hypokalemia 10/23/2017  . Nausea vomiting and diarrhea 10/23/2017  . Hematochezia 10/23/2017  . 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 Back pain 04/12/2014  . Arthritis 04/12/2014  . Knee pain 04/12/2014  . Essential hypertension 11/27/2013  . Osteoarthritis of left knee 07/16/2012    Juel Burrow, PT, DPT 04/16/2021, 12:45 PM  [place] [address]. [place], [place], [address] Phone: [phone]   Fax:  [phone]  Name: Sekai Nayak MRN: [id number] Date of Birth: 1951/06/04

## 2021-04-16 NOTE — Patient Instructions (Signed)
Access Code: [email or web address] URL: [email or web address] Date: 04/16/2021 Prepared by: Shelby Dubin Brandonlee Navis  Exercises Seated Upper Trapezius Stretch - 1 x daily - 7 x weekly - 1 sets - 3 reps - 20 hold Seated Levator Scapulae Stretch - 1 x daily - 7 x weekly - 1 sets - 3 reps - 20 hold Seated Scapular Retraction - 1 x daily - 7 x weekly - 2 sets - 10 reps

## 2021-04-20 ENCOUNTER — Other Ambulatory Visit: Payer: Self-pay

## 2021-04-20 ENCOUNTER — Encounter: Payer: Self-pay | Admitting: Physical Therapy

## 2021-04-20 ENCOUNTER — Ambulatory Visit: Payer: Medicare Other | Admitting: Physical Therapy

## 2021-04-20 DIAGNOSIS — M6281 Muscle weakness (generalized): Secondary | ICD-10-CM

## 2021-04-20 DIAGNOSIS — M542 Cervicalgia: Secondary | ICD-10-CM

## 2021-04-20 DIAGNOSIS — M25611 Stiffness of right shoulder, not elsewhere classified: Secondary | ICD-10-CM

## 2021-04-20 DIAGNOSIS — R293 Abnormal posture: Secondary | ICD-10-CM

## 2021-04-20 DIAGNOSIS — M546 Pain in thoracic spine: Secondary | ICD-10-CM

## 2021-04-20 DIAGNOSIS — R252 Cramp and spasm: Secondary | ICD-10-CM

## 2021-04-20 NOTE — Therapy (Signed)
[address]. [place], [place], [address] Phone: [phone]   Fax:  [phone]  Physical Therapy Treatment  Patient Details  Name: Margaret [name] MRN: [id number] Date of Birth: December 16, 1950 Referring Provider (PT): Veatrice Bourbon Date: 04/20/2021   PT End of Session - 04/20/21 1011     Visit Number 4    Date for PT Re-Evaluation 07/05/21    Authorization Type Medicare    PT Start Time 0928    PT Stop Time 9628    PT Time Calculation (min) 47 min    Activity Tolerance Patient tolerated treatment well    Behavior During Therapy [facility] for tasks assessed/performed             Past Medical History:  Diagnosis Date   Allergy    Anemia    Colitis    Dyspareunia    Endometriosis    GERD (gastroesophageal reflux disease)    High cholesterol    Hx of abnormal Pap smear 1980's   Hypertension    Osteopenia    Ulcer    Urinary incontinence     Past Surgical History:  Procedure Laterality Date   ABDOMINAL HYSTERECTOMY  09/2008   TVH/BSO--Dr. Quincy Simmonds with TVT   BLADDER SUSPENSION  09/2008   Dr. Quincy Simmonds   CERVIX LESION DESTRUCTION  1980   hx abnormal pap--dysplasia   COLONOSCOPY WITH PROPOFOL Left 10/25/2017   Procedure: COLONOSCOPY WITH PROPOFOL;  Surgeon: Ronnette Juniper, MD;  Location: [facility];  Service: Gastroenterology;  Laterality: Left;    There were no vitals filed for this visit.   Subjective Assessment - 04/20/21 0930     Subjective I liftied a box in my garage, my back and neck are hurting more today    Currently in Pain? Yes    Pain Score 6     Pain Location Back    Aggravating Factors  lifting a box                               [facility] Adult PT Treatment/Exercise - 04/20/21 0001       Neck Exercises: Machines for Strengthening   UBE (Upper Arm Bike) L2.0.  24min fwd/3 backward      Neck Exercises: Theraband   Shoulder Extension 20 reps;Red    Rows  20 reps;Red    Shoulder External Rotation 20 reps;Red    Other Theraband Exercises back to wall weighted ball overhead press and W backs      Neck Exercises: Supine   Other Supine Exercise stretches o fHS, piriformis    Other Supine Exercise feet on ball K2C, trunk rotation, snmall bridge and isometrci abs      Moist Heat Therapy   Number Minutes Moist Heat 10 Minutes    Moist Heat Location Cervical;Lumbar Spine      Electrical Stimulation   Electrical Stimulation Location lumbar    Electrical Stimulation Action IFC    Electrical Stimulation Parameters supine    Electrical Stimulation Goals Pain                      PT Short Term Goals - 04/20/21 1012       PT SHORT TERM GOAL #1   Title independent with initial HEP    Status Achieved               PT Long Term Goals -  04/16/21 1240       PT LONG TERM GOAL #1   Title Patient to be independent with advanced HEP.    Status On-going      PT LONG TERM GOAL #2   Title increase cervical ROM to WNL's    Status On-going      PT LONG TERM GOAL #3   Title Patient to demonstrate B shoulder strength >/=4+/5.    Status On-going      PT LONG TERM GOAL #4   Title report =/> 75% reduction in overall pain of neck and chest with daily activity.    Status On-going      PT LONG TERM GOAL #5   Title Pt will be able to walk for fitness 20 min at a time without increasing pain.     Status On-going                   Plan - 04/20/21 1011     Clinical Impression Statement Patient with LBP today, reports moving boxes in garage.  She is tight in the LE's, tight and tender in the lumbar area, did well with the exercises cues needed for posture and core    PT Next Visit Plan strengthening, traction    Consulted and Agree with Plan of Care Patient             Patient will benefit from skilled therapeutic intervention in order to improve the following deficits and impairments:  Decreased range of motion,  Increased muscle spasms, Pain, Improper body mechanics, Decreased strength, Postural dysfunction  Visit Diagnosis: Cervicalgia  Pain in thoracic spine  Cramp and spasm  Muscle weakness (generalized)  Abnormal posture  Stiffness of right shoulder, not elsewhere classified     Problem List Patient Active Problem List   Diagnosis Date Noted   Gastroesophageal reflux disease 06/06/2020   Atypical chest pain 04/21/2020   Closed nondisplaced fracture of proximal phalanx of lesser toe of left foot 11/19/2017   GI bleed 10/23/2017   Hypokalemia 10/23/2017   Nausea vomiting and diarrhea 10/23/2017   Hematochezia 10/23/2017   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 [place], [place], [address] Phone: [phone]   Fax:  [phone]  Name: Margaret [name] MRN: [id number] Date of Birth: 06-26-1951

## 2021-04-23 ENCOUNTER — Ambulatory Visit: Payer: Medicare Other | Admitting: Physical Therapy

## 2021-04-23 ENCOUNTER — Other Ambulatory Visit: Payer: Self-pay

## 2021-04-23 ENCOUNTER — Encounter: Payer: Self-pay | Admitting: Physical Therapy

## 2021-04-23 DIAGNOSIS — R252 Cramp and spasm: Secondary | ICD-10-CM

## 2021-04-23 DIAGNOSIS — R293 Abnormal posture: Secondary | ICD-10-CM

## 2021-04-23 DIAGNOSIS — M6281 Muscle weakness (generalized): Secondary | ICD-10-CM

## 2021-04-23 DIAGNOSIS — M542 Cervicalgia: Secondary | ICD-10-CM

## 2021-04-23 DIAGNOSIS — M546 Pain in thoracic spine: Secondary | ICD-10-CM

## 2021-04-23 NOTE — Therapy (Signed)
[place]. [place], [place], [address] Phone: [phone]   Fax:  [phone]  Physical Therapy Treatment  Patient Details  Name: Margaret [name] MRN: [id number] Date of Birth: Oct 19, 1951 Referring Provider (PT): Patrice Paradise   Encounter Date: 04/23/2021   PT End of Session - 04/23/21 1049     Visit Number 5    Date for PT Re-Evaluation 07/05/21    Authorization Type Medicare    PT Start Time 1607    PT Stop Time 1103    PT Time Calculation (min) 48 min    Activity Tolerance Patient tolerated treatment well    Behavior During Therapy [facility] for tasks assessed/performed             Past Medical History:  Diagnosis Date   Allergy    Anemia    Colitis    Dyspareunia    Endometriosis    GERD (gastroesophageal reflux disease)    High cholesterol    Hx of abnormal Pap smear 1980's   Hypertension    Osteopenia    Ulcer    Urinary incontinence     Past Surgical History:  Procedure Laterality Date   ABDOMINAL HYSTERECTOMY  09/2008   TVH/BSO--Dr. Quincy Simmonds with TVT   BLADDER SUSPENSION  09/2008   Dr. Quincy Simmonds   CERVIX LESION DESTRUCTION  1980   hx abnormal pap--dysplasia   COLONOSCOPY WITH PROPOFOL Left 10/25/2017   Procedure: COLONOSCOPY WITH PROPOFOL;  Surgeon: Ronnette Juniper, MD;  Location: [facility];  Service: Gastroenterology;  Laterality: Left;    There were no vitals filed for this visit.   Subjective Assessment - 04/23/21 1020     Subjective Reports that her back is doing a little better, she is noticing more pain in the right arm to the right middle finger.  She is unsure of the cause    Currently in Pain? Yes    Pain Score 8     Pain Location Arm    Pain Orientation Right    Pain Descriptors / Indicators Aching;Sore                               [facility] Adult PT Treatment/Exercise - 04/23/21 0001       Neck Exercises: Machines for Strengthening   Nustep Level 5 x 6  minutes      Neck Exercises: Theraband   Shoulder Extension 20 reps;Red    Rows 20 reps;Red    Shoulder External Rotation 20 reps;Red    Other Theraband Exercises back to wall weighted ball overhead press and W backs      Neck Exercises: Standing   Other Standing Exercises weighted ball overhead reach, W backs, 5# shrugs with upper trap and levator stretches    Other Standing Exercises Upper trap and levator stretch.  B side with 20 sec hold x3 each      Traction   Type of Traction Cervical    Max (lbs) 10    Hold Time static    Time 12      Manual Therapy   Manual Therapy Soft tissue mobilization    Soft tissue mobilization to the left upper trap, rhomboid and the cervical area                      PT Short Term Goals - 04/20/21 1012       PT SHORT TERM GOAL #  1   Title independent with initial HEP    Status Achieved               PT Long Term Goals - 04/16/21 1240       PT LONG TERM GOAL #1   Title Patient to be independent with advanced HEP.    Status On-going      PT LONG TERM GOAL #2   Title increase cervical ROM to WNL's    Status On-going      PT LONG TERM GOAL #3   Title Patient to demonstrate B shoulder strength >/=4+/5.    Status On-going      PT LONG TERM GOAL #4   Title report =/> 75% reduction in overall pain of neck and chest with daily activity.    Status On-going      PT LONG TERM GOAL #5   Title Pt will be able to walk for fitness 20 min at a time without increasing pain.     Status On-going                   Plan - 04/23/21 1049     Clinical Impression Statement Patient reporting more left UE symtoms today.she is very tight in the left upper trap, the left rhomboid and the left cervical area.  We returned to the traction today with her c/o increased left UE symptoms    PT Next Visit Plan try dry needling    Consulted and Agree with Plan of Care Patient             Patient will benefit from skilled  therapeutic intervention in order to improve the following deficits and impairments:  Decreased range of motion, Increased muscle spasms, Pain, Improper body mechanics, Decreased strength, Postural dysfunction  Visit Diagnosis: Cervicalgia  Pain in thoracic spine  Cramp and spasm  Muscle weakness (generalized)  Abnormal posture     Problem List Patient Active Problem List   Diagnosis Date Noted   Gastroesophageal reflux disease 06/06/2020   Atypical chest pain 04/21/2020   Closed nondisplaced fracture of proximal phalanx of lesser toe of left foot 11/19/2017   GI bleed 10/23/2017   Hypokalemia 10/23/2017   Nausea vomiting and diarrhea 10/23/2017   Hematochezia 10/23/2017   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 [place], [place], [address] Phone: [phone]   Fax:  [phone]  Name: Margaret [name] MRN: [id number] Date of Birth: [birth year]

## 2021-04-27 ENCOUNTER — Other Ambulatory Visit: Payer: Self-pay

## 2021-04-27 ENCOUNTER — Ambulatory Visit: Payer: Medicare Other | Admitting: Physical Therapy

## 2021-04-27 ENCOUNTER — Encounter: Payer: Self-pay | Admitting: Physical Therapy

## 2021-04-27 DIAGNOSIS — M6281 Muscle weakness (generalized): Secondary | ICD-10-CM

## 2021-04-27 DIAGNOSIS — R252 Cramp and spasm: Secondary | ICD-10-CM

## 2021-04-27 DIAGNOSIS — M546 Pain in thoracic spine: Secondary | ICD-10-CM

## 2021-04-27 DIAGNOSIS — R293 Abnormal posture: Secondary | ICD-10-CM

## 2021-04-27 DIAGNOSIS — M542 Cervicalgia: Secondary | ICD-10-CM | POA: Diagnosis not present

## 2021-04-27 NOTE — Therapy (Signed)
[name]. [name], [place], [address] Phone: [phone]   Fax:  [phone]  Physical Therapy Treatment  Patient Details  Name: Margaret [name] MRN: [id number] Date of Birth: [birth year] Referring Provider (PT): Veatrice Bourbon Date: 04/27/2021   PT End of Session - 04/27/21 1008     Visit Number 6    Date for PT Re-Evaluation 07/05/21    Authorization Type Medicare    PT Start Time 0928    PT Stop Time 1022    PT Time Calculation (min) 54 min    Activity Tolerance Patient tolerated treatment well    Behavior During Therapy [facility] for tasks assessed/performed             Past Medical History:  Diagnosis Date   Allergy    Anemia    Colitis    Dyspareunia    Endometriosis    GERD (gastroesophageal reflux disease)    High cholesterol    Hx of abnormal Pap smear 1980's   Hypertension    Osteopenia    Ulcer    Urinary incontinence     Past Surgical History:  Procedure Laterality Date   ABDOMINAL HYSTERECTOMY  09/2008   TVH/BSO--Dr. Quincy Simmonds with TVT   BLADDER SUSPENSION  09/2008   Dr. Quincy Simmonds   CERVIX LESION DESTRUCTION  1980   hx abnormal pap--dysplasia   COLONOSCOPY WITH PROPOFOL Left 10/25/2017   Procedure: COLONOSCOPY WITH PROPOFOL;  Surgeon: Ronnette Juniper, MD;  Location: [facility];  Service: Gastroenterology;  Laterality: Left;    There were no vitals filed for this visit.   Subjective Assessment - 04/27/21 0933     Subjective Feeling better, I am just tight    Currently in Pain? Yes    Pain Score 6     Pain Location Neck    Pain Orientation Right    Pain Descriptors / Indicators Aching;Sore    Pain Relieving Factors treatment helps some                               [facility] Adult PT Treatment/Exercise - 04/27/21 0001       Neck Exercises: Machines for Strengthening   Nustep Level 5 x 6 minutes      Neck Exercises: Theraband   Shoulder Extension 20  reps;Red    Rows 20 reps;Red    Shoulder External Rotation 20 reps;Red    Other Theraband Exercises back to wall weighted ball overhead press and W backs      Neck Exercises: Standing   Neck Retraction 20 reps;3 secs    Other Standing Exercises weighted ball overhead reach, W backs, 5# shrugs with upper trap and levator stretches    Other Standing Exercises Upper trap and levator stretch.  B side with 20 sec hold x3 each      Moist Heat Therapy   Number Minutes Moist Heat 10 Minutes    Moist Heat Location Cervical;Lumbar Spine      Electrical Stimulation   Electrical Stimulation Location left CT area    Electrical Stimulation Action IFC    Electrical Stimulation Parameters supine    Electrical Stimulation Goals Pain      Manual Therapy   Manual Therapy Soft tissue mobilization    Soft tissue mobilization to the left upper trap, rhomboid and the cervical area              Trigger  Point Dry Needling - 04/27/21 0001     Consent Given? Yes    Education Handout Provided Yes    Muscles Treated Head and Neck Upper trapezius    Upper Trapezius Response Twitch reponse elicited;Palpable increased muscle length                    PT Short Term Goals - 04/20/21 1012       PT SHORT TERM GOAL #1   Title independent with initial HEP    Status Achieved               PT Long Term Goals - 04/27/21 1022       PT LONG TERM GOAL #1   Title Patient to be independent with advanced HEP.    Status On-going      PT LONG TERM GOAL #2   Title increase cervical ROM to WNL's    Status Partially Met      PT LONG TERM GOAL #3   Title Patient to demonstrate B shoulder strength >/=4+/5.    Status On-going                   Plan - 04/27/21 1010     Clinical Impression Statement Added DN, got good LTR's mild soreness after but did some STM and modalities to work this out.  ROM looked better and a little easier after treatment, she is still very tight and tender  in the left upper trap and into the neck area    PT Next Visit Plan see how things went, progress as tolerated    Consulted and Agree with Plan of Care Patient             Patient will benefit from skilled therapeutic intervention in order to improve the following deficits and impairments:  Decreased range of motion, Increased muscle spasms, Pain, Improper body mechanics, Decreased strength, Postural dysfunction  Visit Diagnosis: Cervicalgia  Pain in thoracic spine  Cramp and spasm  Muscle weakness (generalized)  Abnormal posture     Problem List Patient Active Problem List   Diagnosis Date Noted   Gastroesophageal reflux disease 06/06/2020   Atypical chest pain 04/21/2020   Closed nondisplaced fracture of proximal phalanx of lesser toe of left foot 11/19/2017   GI bleed 10/23/2017   Hypokalemia 10/23/2017   Nausea vomiting and diarrhea 10/23/2017   Hematochezia 10/23/2017   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 [place], [place], [address] Phone: [phone]   Fax:  [phone]  Name: Margaret [name] MRN: [id number] Date of Birth: 1951/09/27

## 2021-04-27 NOTE — Patient Instructions (Signed)

## 2021-04-30 ENCOUNTER — Other Ambulatory Visit: Payer: Self-pay

## 2021-04-30 ENCOUNTER — Encounter: Payer: Self-pay | Admitting: Rehabilitative and Restorative Service Providers"

## 2021-04-30 ENCOUNTER — Ambulatory Visit: Payer: Medicare Other | Admitting: Rehabilitative and Restorative Service Providers"

## 2021-04-30 DIAGNOSIS — M6281 Muscle weakness (generalized): Secondary | ICD-10-CM

## 2021-04-30 DIAGNOSIS — M542 Cervicalgia: Secondary | ICD-10-CM

## 2021-04-30 DIAGNOSIS — R293 Abnormal posture: Secondary | ICD-10-CM

## 2021-04-30 DIAGNOSIS — M25611 Stiffness of right shoulder, not elsewhere classified: Secondary | ICD-10-CM

## 2021-04-30 DIAGNOSIS — M546 Pain in thoracic spine: Secondary | ICD-10-CM

## 2021-04-30 DIAGNOSIS — R252 Cramp and spasm: Secondary | ICD-10-CM

## 2021-04-30 DIAGNOSIS — M25511 Pain in right shoulder: Secondary | ICD-10-CM

## 2021-04-30 NOTE — Therapy (Signed)
[place] [name], [place], [address] Phone: [phone]   Fax:  [phone]  Physical Therapy Treatment  Patient Details  Name: Margaret [name] MRN: [id number] Date of Birth: 05/06/1951 Referring Provider (PT): Veatrice Bourbon Date: 04/30/2021   PT End of Session - 04/30/21 0926     Visit Number 7    Date for PT Re-Evaluation 07/05/21    Authorization Type Medicare    PT Start Time 3810    PT Stop Time 1751    PT Time Calculation (min) 42 min    Activity Tolerance Patient tolerated treatment well    Behavior During Therapy [facility] for tasks assessed/performed             Past Medical History:  Diagnosis Date   Allergy    Anemia    Colitis    Dyspareunia    Endometriosis    GERD (gastroesophageal reflux disease)    High cholesterol    Hx of abnormal Pap smear 1980's   Hypertension    Osteopenia    Ulcer    Urinary incontinence     Past Surgical History:  Procedure Laterality Date   ABDOMINAL HYSTERECTOMY  09/2008   TVH/BSO--Dr. Quincy Simmonds with TVT   BLADDER SUSPENSION  09/2008   Dr. Quincy Simmonds   CERVIX LESION DESTRUCTION  1980   hx abnormal pap--dysplasia   COLONOSCOPY WITH PROPOFOL Left 10/25/2017   Procedure: COLONOSCOPY WITH PROPOFOL;  Surgeon: Ronnette Juniper, MD;  Location: [facility];  Service: Gastroenterology;  Laterality: Left;    There were no vitals filed for this visit.   Subjective Assessment - 04/30/21 0925     Subjective I feel better after the DN    Patient Stated Goals have less pain, better ROM    Currently in Pain? Yes    Pain Score 4     Pain Location Neck    Pain Orientation Left    Pain Descriptors / Indicators Aching;Radiating;Sore    Pain Type Chronic pain                               [facility] Adult PT Treatment/Exercise - 04/30/21 0001       Neck Exercises: Machines for Strengthening   UBE (Upper Arm Bike) L2.0.  31mn fwd/3 backward       Neck Exercises: Theraband   Shoulder Extension 20 reps;Red    Rows 20 reps;Red    Shoulder External Rotation 20 reps;Red    Other Theraband Exercises back to wall yellow weighted ball overhead press and W backs with 2#, 2x10 each      Neck Exercises: Standing   Neck Retraction 20 reps;3 secs      Neck Exercises: Seated   Shoulder Shrugs 20 reps    Shoulder Shrugs Limitations 5#      Neck Exercises: Stretches   Upper Trapezius Stretch Right;Left;3 reps;20 seconds    Levator Stretch Right;Left;3 reps;20 seconds                      PT Short Term Goals - 04/20/21 1012       PT SHORT TERM GOAL #1   Title independent with initial HEP    Status Achieved               PT Long Term Goals - 04/30/21 0942       PT LONG TERM GOAL #  1   Title Patient to be independent with advanced HEP.    Status On-going      PT LONG TERM GOAL #2   Title increase cervical ROM to WNL's    Status Partially Met      PT LONG TERM GOAL #3   Title Patient to demonstrate B shoulder strength >/=4+/5.    Status On-going      PT LONG TERM GOAL #4   Title report =/> 75% reduction in overall pain of neck and chest with daily activity.    Status On-going      PT LONG TERM GOAL #5   Title Pt will be able to walk for fitness 20 min at a time without increasing pain.     Status On-going                   Plan - 04/30/21 8347     Clinical Impression Statement Patient reports that she was feeling better following DN last session.  Pt required cuing during shoulder shrugs to maintain chin tuck to decrease radicular symptoms down L arm.  She continues to have trigger points in her L upper trap, but pain eased following manual therapy.  She continues to require skilled PT to progress towards goal related activities.    PT Treatment/Interventions ADLs/Self Care Home Management;Electrical Stimulation;Moist Heat;Iontophoresis 60m/ml Dexamethasone;Traction;Therapeutic  exercise;Therapeutic activities;Neuromuscular re-education;Manual techniques;Patient/family education;Dry needling    PT Next Visit Plan Progress strengthening and ROM/muscle flexibility    Consulted and Agree with Plan of Care Patient             Patient will benefit from skilled therapeutic intervention in order to improve the following deficits and impairments:  Decreased range of motion, Increased muscle spasms, Pain, Improper body mechanics, Decreased strength, Postural dysfunction  Visit Diagnosis: Cervicalgia  Pain in thoracic spine  Cramp and spasm  Muscle weakness (generalized)  Abnormal posture  Stiffness of right shoulder, not elsewhere classified  Acute pain of right shoulder     Problem List Patient Active Problem List   Diagnosis Date Noted   Gastroesophageal reflux disease 06/06/2020   Atypical chest pain 04/21/2020   Closed nondisplaced fracture of proximal phalanx of lesser toe of left foot 11/19/2017   GI bleed 10/23/2017   Hypokalemia 10/23/2017   Nausea vomiting and diarrhea 10/23/2017   Hematochezia 10/23/2017   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

## 2021-05-11 ENCOUNTER — Encounter: Payer: Self-pay | Admitting: Physical Therapy

## 2021-05-11 ENCOUNTER — Other Ambulatory Visit: Payer: Self-pay

## 2021-05-11 ENCOUNTER — Ambulatory Visit: Payer: Medicare Other | Attending: Orthopaedic Surgery | Admitting: Physical Therapy

## 2021-05-11 DIAGNOSIS — R252 Cramp and spasm: Secondary | ICD-10-CM | POA: Insufficient documentation

## 2021-05-11 DIAGNOSIS — M6281 Muscle weakness (generalized): Secondary | ICD-10-CM | POA: Diagnosis present

## 2021-05-11 DIAGNOSIS — M546 Pain in thoracic spine: Secondary | ICD-10-CM | POA: Insufficient documentation

## 2021-05-11 DIAGNOSIS — M542 Cervicalgia: Secondary | ICD-10-CM | POA: Insufficient documentation

## 2021-05-11 DIAGNOSIS — R293 Abnormal posture: Secondary | ICD-10-CM | POA: Diagnosis present

## 2021-05-11 NOTE — Therapy (Signed)
[place]. [place], [place], [address] Phone: [phone]   Fax:  [phone]  Physical Therapy Treatment  Patient Details  Name: Margaret [name] MRN: [id number] Date of Birth: January 21, 1951 Referring Provider (PT): Patrice Paradise   Encounter Date: 05/11/2021   PT End of Session - 05/11/21 1051     Visit Number 8    Date for PT Re-Evaluation 07/05/21    Authorization Type Medicare    PT Start Time 1005    PT Stop Time 1045    PT Time Calculation (min) 40 min    Activity Tolerance Patient tolerated treatment well    Behavior During Therapy [facility] for tasks assessed/performed             Past Medical History:  Diagnosis Date   Allergy    Anemia    Colitis    Dyspareunia    Endometriosis    GERD (gastroesophageal reflux disease)    High cholesterol    Hx of abnormal Pap smear 1980's   Hypertension    Osteopenia    Ulcer    Urinary incontinence     Past Surgical History:  Procedure Laterality Date   ABDOMINAL HYSTERECTOMY  09/2008   TVH/BSO--Dr. Quincy Simmonds with TVT   BLADDER SUSPENSION  09/2008   Dr. Quincy Simmonds   CERVIX LESION DESTRUCTION  1980   hx abnormal pap--dysplasia   COLONOSCOPY WITH PROPOFOL Left 10/25/2017   Procedure: COLONOSCOPY WITH PROPOFOL;  Surgeon: Ronnette Juniper, MD;  Location: [facility];  Service: Gastroenterology;  Laterality: Left;    There were no vitals filed for this visit.   Subjective Assessment - 05/11/21 1017     Subjective I think I want to try the DN again, I think it really helped, my neck and shoulders just get tense    Currently in Pain? Yes    Pain Score 4     Pain Location Neck    Pain Orientation Left    Pain Descriptors / Indicators Spasm;Tightness    Aggravating Factors  stress                               [facility] Adult PT Treatment/Exercise - 05/11/21 0001       Neck Exercises: Machines for Strengthening   UBE (Upper Arm Bike) L2.0.  37mn  fwd/3 backward    Cybex Row 20# 2x10    Lat Pull 20# 2x10      Neck Exercises: Theraband   Other Theraband Exercises back to wall yellow weighted ball overhead press and W backs with 2#, 2x10 each      Neck Exercises: Standing   Other Standing Exercises 5# shrugs with upper trap and levator stretches      Manual Therapy   Manual Therapy Soft tissue mobilization;Passive ROM;Manual Traction    Soft tissue mobilization to the left upper trap, rhomboid and the cervical area    Passive ROM cervical spine rotation and sidebending    Manual Traction occipital release              Trigger Point Dry Needling - 05/11/21 0001     Consent Given? Yes    Upper Trapezius Response Twitch reponse elicited;Palpable increased muscle length                    PT Short Term Goals - 04/20/21 1012       PT SHORT TERM GOAL #  1   Title independent with initial HEP    Status Achieved               PT Long Term Goals - 05/11/21 1104       PT LONG TERM GOAL #2   Title increase cervical ROM to WNL's    Status Partially Met      PT LONG TERM GOAL #3   Title Patient to demonstrate B shoulder strength >/=4+/5.    Status Partially Met                   Plan - 05/11/21 1052     Clinical Impression Statement Patient had significant LTR in the left upper trap, very sore and tight, she has some limitation in cervical ROM and left shoulder ROM, needed PT overpressure to do the overhead lift and the W backs.  I added some increased machine strenght exercises, she has two big events conming up that she has a lot of planning and some stress going on    PT Next Visit Plan Progress strengthening and ROM/muscle flexibility    Consulted and Agree with Plan of Care Patient             Patient will benefit from skilled therapeutic intervention in order to improve the following deficits and impairments:  Decreased range of motion, Increased muscle spasms, Pain, Improper body  mechanics, Decreased strength, Postural dysfunction  Visit Diagnosis: Cervicalgia  Pain in thoracic spine  Cramp and spasm  Muscle weakness (generalized)  Abnormal posture     Problem List Patient Active Problem List   Diagnosis Date Noted   Gastroesophageal reflux disease 06/06/2020   Atypical chest pain 04/21/2020   Closed nondisplaced fracture of proximal phalanx of lesser toe of left foot 11/19/2017   GI bleed 10/23/2017   Hypokalemia 10/23/2017   Nausea vomiting and diarrhea 10/23/2017   Hematochezia 10/23/2017   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 [place], [place], [address] Phone: [phone]   Fax:  [phone]  Name: BRYLEA PITA MRN: [id number] Date of Birth: 1951/08/22

## 2021-05-16 ENCOUNTER — Encounter: Payer: Self-pay | Admitting: Physical Therapy

## 2021-05-16 ENCOUNTER — Other Ambulatory Visit: Payer: Self-pay

## 2021-05-16 ENCOUNTER — Ambulatory Visit: Payer: Medicare Other | Admitting: Physical Therapy

## 2021-05-16 DIAGNOSIS — M542 Cervicalgia: Secondary | ICD-10-CM

## 2021-05-16 DIAGNOSIS — R293 Abnormal posture: Secondary | ICD-10-CM

## 2021-05-16 DIAGNOSIS — M6281 Muscle weakness (generalized): Secondary | ICD-10-CM

## 2021-05-16 DIAGNOSIS — R252 Cramp and spasm: Secondary | ICD-10-CM

## 2021-05-16 DIAGNOSIS — M546 Pain in thoracic spine: Secondary | ICD-10-CM

## 2021-05-16 NOTE — Therapy (Signed)
[facility] [address] [place], [place], [address] Phone: [phone]   Fax:  [phone]  Physical Therapy Treatment  Patient Details  Name: Margaret [name] MRN: [id number] Date of Birth: 12/06/1950 Referring Provider (PT): Cohen   Encounter Date: 05/16/2021   PT End of Session - 05/16/21 1017     Visit Number 9    Date for PT Re-Evaluation 07/05/21    Authorization Type Medicare    PT Start Time 0929    PT Stop Time 1017    PT Time Calculation (min) 48 min    Activity Tolerance Patient tolerated treatment well    Behavior During Therapy WFL for tasks assessed/performed             Past Medical History:  Diagnosis Date   Allergy    Anemia    Colitis    Dyspareunia    Endometriosis    GERD (gastroesophageal reflux disease)    High cholesterol    Hx of abnormal Pap smear 1980's   Hypertension    Osteopenia    Ulcer    Urinary incontinence     Past Surgical History:  Procedure Laterality Date   ABDOMINAL HYSTERECTOMY  09/2008   TVH/BSO--Dr. Silva with TVT   BLADDER SUSPENSION  09/2008   Dr. Silva   CERVIX LESION DESTRUCTION  1980   hx abnormal pap--dysplasia   COLONOSCOPY WITH PROPOFOL Left 10/25/2017   Procedure: COLONOSCOPY WITH PROPOFOL;  Surgeon: Karki, Arya, MD;  Location: [facility];  Service: Gastroenterology;  Laterality: Left;    There were no vitals filed for this visit.   Subjective Assessment - 05/16/21 0933     Subjective I have so much going on, I am planning and in charge of so many things right now, a lot of stress    Currently in Pain? Yes    Pain Score 6     Pain Location Neck    Pain Orientation Left    Pain Descriptors / Indicators Aching;Spasm;Tightness    Aggravating Factors  stress                               [facility] Adult PT Treatment/Exercise - 05/16/21 0001       Neck Exercises: Machines for Strengthening   UBE (Upper Arm Bike) L2.0.   3min fwd/3 backward    Cybex Row 20# 2x10    Cybex Chest Press 5# 2x10    Lat Pull 20# 2x10      Neck Exercises: Theraband   Other Theraband Exercises back to wall yellow weighted ball overhead press and W backs with 2#, 2x10 each      Neck Exercises: Standing   Other Standing Exercises 5# shrugs with upper trap and levator stretches      Manual Therapy   Manual Therapy Soft tissue mobilization;Passive ROM;Manual Traction    Soft tissue mobilization to the left upper trap, rhomboid and the cervical area    Passive ROM cervical spine rotation and sidebending    Manual Traction occipital release                      PT Short Term Goals - 04/20/21 1012       PT SHORT TERM GOAL #1   Title independent with initial HEP    Status Achieved               PT   Long Term Goals - 05/11/21 1104       PT LONG TERM GOAL #2   Title increase cervical ROM to WNL's    Status Partially Met      PT LONG TERM GOAL #3   Title Patient to demonstrate B shoulder strength >/=4+/5.    Status Partially Met                   Plan - 05/16/21 1017     Clinical Impression Statement Reports that she is tight in the left UE, I did some neural tension stretches with her today, she has knots and tenderness in the left upper trap and into the cervical area.  She was able to do the exercises without much difficulty just the ROM with tight pecs and tightness in the left UE    PT Next Visit Plan Progress strengthening and ROM/muscle flexibility    Consulted and Agree with Plan of Care Patient             Patient will benefit from skilled therapeutic intervention in order to improve the following deficits and impairments:  Decreased range of motion, Increased muscle spasms, Pain, Improper body mechanics, Decreased strength, Postural dysfunction  Visit Diagnosis: Cervicalgia  Pain in thoracic spine  Cramp and spasm  Muscle weakness (generalized)  Abnormal  posture     Problem List Patient Active Problem List   Diagnosis Date Noted   Gastroesophageal reflux disease 06/06/2020   Atypical chest pain 04/21/2020   Closed nondisplaced fracture of proximal phalanx of lesser toe of left foot 11/19/2017   GI bleed 10/23/2017   Hypokalemia 10/23/2017   Nausea vomiting and diarrhea 10/23/2017   Hematochezia 10/23/2017   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 [place], [place], [address] Phone: [phone]   Fax:  [phone]  Name: Daveena L Sheils MRN: [id number] Date of Birth: 09/29/1951    

## 2021-05-18 ENCOUNTER — Ambulatory Visit: Payer: Medicare Other | Admitting: Physical Therapy

## 2021-05-18 ENCOUNTER — Other Ambulatory Visit: Payer: Self-pay

## 2021-05-18 ENCOUNTER — Encounter: Payer: Self-pay | Admitting: Physical Therapy

## 2021-05-18 DIAGNOSIS — R293 Abnormal posture: Secondary | ICD-10-CM

## 2021-05-18 DIAGNOSIS — M546 Pain in thoracic spine: Secondary | ICD-10-CM

## 2021-05-18 DIAGNOSIS — M542 Cervicalgia: Secondary | ICD-10-CM | POA: Diagnosis not present

## 2021-05-18 DIAGNOSIS — R252 Cramp and spasm: Secondary | ICD-10-CM

## 2021-05-18 DIAGNOSIS — M6281 Muscle weakness (generalized): Secondary | ICD-10-CM

## 2021-05-18 NOTE — Therapy (Signed)
[place]. [place], [place], [address] Phone: [phone]   Fax:  [phone] Progress Note Reporting Period 04/04/21 to 05/18/21 for the first 10 visits  See note below for Objective Data and Assessment of Progress/Goals.     Physical Therapy Treatment  Patient Details  Name: Margaret [name] MRN: [id number] Date of Birth: 1950-12-07 Referring Provider (PT): Veatrice Bourbon Date: 05/18/2021   PT End of Session - 05/18/21 1047     Visit Number 10    Date for PT Re-Evaluation 07/05/21    Authorization Type Medicare    PT Start Time 1005    PT Stop Time 1050    PT Time Calculation (min) 45 min    Activity Tolerance Patient tolerated treatment well    Behavior During Therapy [facility] for tasks assessed/performed             Past Medical History:  Diagnosis Date   Allergy    Anemia    Colitis    Dyspareunia    Endometriosis    GERD (gastroesophageal reflux disease)    High cholesterol    Hx of abnormal Pap smear 1980's   Hypertension    Osteopenia    Ulcer    Urinary incontinence     Past Surgical History:  Procedure Laterality Date   ABDOMINAL HYSTERECTOMY  09/2008   TVH/BSO--Dr. Quincy Simmonds with TVT   BLADDER SUSPENSION  09/2008   Dr. Quincy Simmonds   CERVIX LESION DESTRUCTION  1980   hx abnormal pap--dysplasia   COLONOSCOPY WITH PROPOFOL Left 10/25/2017   Procedure: COLONOSCOPY WITH PROPOFOL;  Surgeon: Ronnette Juniper, MD;  Location: [facility];  Service: Gastroenterology;  Laterality: Left;    There were no vitals filed for this visit.   Subjective Assessment - 05/18/21 1008     Subjective I think that stretching of the arm helped, still hurting in the neck    Currently in Pain? Yes    Pain Score 5     Pain Location Neck    Pain Orientation Left    Aggravating Factors  stress                               [facility] Adult PT Treatment/Exercise - 05/18/21 0001       Neck  Exercises: Machines for Strengthening   Nustep Level 5 x 6 minutes    Other Machines for Strengthening 5# 2x10    Other Machines for Strengthening 20# 2x10      Neck Exercises: Theraband   Shoulder External Rotation 20 reps;Red    Other Theraband Exercises back to wall yellow weighted ball overhead press and W backs with 2#, 2x10 each      Neck Exercises: Standing   Neck Retraction 20 reps;3 secs    Other Standing Exercises weighted ball overhead lift    Other Standing Exercises 5# shrugs with upper trap and levator stretches      Neck Exercises: Supine   Other Supine Exercise feet on ball K2C, trunk rotation, bridges, isometric abs      Manual Therapy   Manual Therapy Soft tissue mobilization;Passive ROM;Manual Traction    Soft tissue mobilization to the left upper trap, rhomboid and the cervical area    Passive ROM cervical spine rotation and sidebending    Manual Traction occipital release      Neck Exercises: Stretches   Corner Stretch 3 reps;10 seconds  PT Short Term Goals - 04/20/21 1012       PT SHORT TERM GOAL #1   Title independent with initial HEP    Status Achieved               PT Long Term Goals - 05/18/21 1049       PT LONG TERM GOAL #1   Title Patient to be independent with advanced HEP.    Status Partially Met      PT LONG TERM GOAL #2   Title increase cervical ROM to WNL's    Status Partially Met      PT LONG TERM GOAL #3   Title Patient to demonstrate B shoulder strength >/=4+/5.    Status Partially Met                   Plan - 05/18/21 1048     Clinical Impression Statement Continues to have some UE mm and nerve tightness in the left.  She is doing a lot of stressful planning for a family reunion and a high school reunion.  I added some more total body type exercises today to help with core stability to helpw ith pain  and decreased stress    PT Next Visit Plan Progress strengthening and  ROM/muscle flexibility    Consulted and Agree with Plan of Care Patient             Patient will benefit from skilled therapeutic intervention in order to improve the following deficits and impairments:  Decreased range of motion, Increased muscle spasms, Pain, Improper body mechanics, Decreased strength, Postural dysfunction  Visit Diagnosis: Cervicalgia  Pain in thoracic spine  Cramp and spasm  Muscle weakness (generalized)  Abnormal posture     Problem List Patient Active Problem List   Diagnosis Date Noted   Gastroesophageal reflux disease 06/06/2020   Atypical chest pain 04/21/2020   Closed nondisplaced fracture of proximal phalanx of lesser toe of left foot 11/19/2017   GI bleed 10/23/2017   Hypokalemia 10/23/2017   Nausea vomiting and diarrhea 10/23/2017   Hematochezia 10/23/2017   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 [place], [place], [address] Phone: [phone]   Fax:  [phone]  Name: Margaret [name] MRN: [id number] Date of Birth: 08-Mar-1951

## 2021-05-22 ENCOUNTER — Encounter: Payer: Self-pay | Admitting: Physical Therapy

## 2021-05-22 ENCOUNTER — Other Ambulatory Visit: Payer: Self-pay

## 2021-05-22 ENCOUNTER — Ambulatory Visit: Payer: Medicare Other | Admitting: Physical Therapy

## 2021-05-22 DIAGNOSIS — M542 Cervicalgia: Secondary | ICD-10-CM

## 2021-05-22 DIAGNOSIS — M546 Pain in thoracic spine: Secondary | ICD-10-CM

## 2021-05-22 DIAGNOSIS — M6281 Muscle weakness (generalized): Secondary | ICD-10-CM

## 2021-05-22 DIAGNOSIS — R293 Abnormal posture: Secondary | ICD-10-CM

## 2021-05-22 DIAGNOSIS — R252 Cramp and spasm: Secondary | ICD-10-CM

## 2021-05-22 NOTE — Therapy (Signed)
[place]. [place], [place], [address] Phone: [phone]   Fax:  [phone]  Physical Therapy Treatment  Patient Details  Name: BROCHA GILLIAM MRN: [id number] Date of Birth: [birth year] Referring Provider (PT): Patrice Paradise   Encounter Date: 05/22/2021   PT End of Session - 05/22/21 1054     Visit Number 11    Date for PT Re-Evaluation 07/05/21    Authorization Type Medicare    PT Start Time 1013    PT Stop Time 1057    PT Time Calculation (min) 44 min    Activity Tolerance Patient tolerated treatment well    Behavior During Therapy [facility] for tasks assessed/performed             Past Medical History:  Diagnosis Date   Allergy    Anemia    Colitis    Dyspareunia    Endometriosis    GERD (gastroesophageal reflux disease)    High cholesterol    Hx of abnormal Pap smear 1980's   Hypertension    Osteopenia    Ulcer    Urinary incontinence     Past Surgical History:  Procedure Laterality Date   ABDOMINAL HYSTERECTOMY  09/2008   TVH/BSO--Dr. Quincy Simmonds with TVT   BLADDER SUSPENSION  09/2008   Dr. Quincy Simmonds   CERVIX LESION DESTRUCTION  1980   hx abnormal pap--dysplasia   COLONOSCOPY WITH PROPOFOL Left 10/25/2017   Procedure: COLONOSCOPY WITH PROPOFOL;  Surgeon: Ronnette Juniper, MD;  [facility];  Service: Gastroenterology;  Laterality: Left;    There were no vitals filed for this visit.   Subjective Assessment - 05/22/21 1015     Subjective Doing okay today, neck and shoulder still hurting    Currently in Pain? Yes    Pain Score 3     Pain Location Neck    Pain Orientation Left                               [facility] Adult PT Treatment/Exercise - 05/22/21 0001       Neck Exercises: Machines for Strengthening   UBE (Upper Arm Bike) L2.0.  9mn fwd/3 backward    Cybex Row 20# 2x10    Cybex Chest Press 5# 2x10    Lat Pull 20# 2x10    Other Machines for Strengthening 5#  2x10 leg extension    Other Machines for Strengthening 20# 2x10 leg curls, 10# straight arm pulls, 20# leg press      Neck Exercises: Standing   Other Standing Exercises 20# farmer's carry      Neck Exercises: Supine   Other Supine Exercise feet on ball K2C, trunk rotation, bridges, isometric abs      Manual Therapy   Passive ROM left shoulder distraction and ER/IR, some wrist stretches                      PT Short Term Goals - 04/20/21 1012       PT SHORT TERM GOAL #1   Title independent with initial HEP    Status Achieved               PT Long Term Goals - 05/18/21 1049       PT LONG TERM GOAL #1   Title Patient to be independent with advanced HEP.    Status Partially Met      PT LONG TERM  GOAL #2   Title increase cervical ROM to WNL's    Status Partially Met      PT LONG TERM GOAL #3   Title Patient to demonstrate B shoulder strength >/=4+/5.    Status Partially Met                   Plan - 05/22/21 1054     Clinical Impression Statement Patient started having some left arm pain during the isometric abs where she pushes with the arm, we had also done the farmer's carry prior.  I did some stretches and she has good ROM just seems to have some neural tension and does have a hard time relaxing    PT Next Visit Plan Progress strengthening and ROM/muscle flexibility    Consulted and Agree with Plan of Care Patient             Patient will benefit from skilled therapeutic intervention in order to improve the following deficits and impairments:  Decreased range of motion, Increased muscle spasms, Pain, Improper body mechanics, Decreased strength, Postural dysfunction  Visit Diagnosis: Cervicalgia  Pain in thoracic spine  Cramp and spasm  Muscle weakness (generalized)  Abnormal posture     Problem List Patient Active Problem List   Diagnosis Date Noted   Gastroesophageal reflux disease 06/06/2020   Atypical chest pain  04/21/2020   Closed nondisplaced fracture of proximal phalanx of lesser toe of left foot 11/19/2017   GI bleed 10/23/2017   Hypokalemia 10/23/2017   Nausea vomiting and diarrhea 10/23/2017   Hematochezia 10/23/2017   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 [place], [place], [address] Phone: [phone]   Fax:  [phone]  Name: BRIGHTON DELIO MRN: [id number] Date of Birth: 04/05/1951

## 2021-05-24 ENCOUNTER — Encounter: Payer: Self-pay | Admitting: Physical Therapy

## 2021-05-24 ENCOUNTER — Other Ambulatory Visit: Payer: Self-pay

## 2021-05-24 ENCOUNTER — Ambulatory Visit: Payer: Medicare Other | Admitting: Physical Therapy

## 2021-05-24 DIAGNOSIS — R252 Cramp and spasm: Secondary | ICD-10-CM

## 2021-05-24 DIAGNOSIS — M546 Pain in thoracic spine: Secondary | ICD-10-CM

## 2021-05-24 DIAGNOSIS — M542 Cervicalgia: Secondary | ICD-10-CM

## 2021-05-24 DIAGNOSIS — R293 Abnormal posture: Secondary | ICD-10-CM

## 2021-05-24 DIAGNOSIS — M6281 Muscle weakness (generalized): Secondary | ICD-10-CM

## 2021-05-24 NOTE — Therapy (Signed)
[place]. [place], [place], [address] Phone: [phone]   Fax:  [phone]  Physical Therapy Treatment  Patient Details  Name: DOMINIQUA COONER MRN: [id number] Date of Birth: Sep 07, 1951 Referring Provider (PT): Patrice Paradise   Encounter Date: 05/24/2021   PT End of Session - 05/24/21 1201     Visit Number 12    Date for PT Re-Evaluation 07/05/21    Authorization Type Medicare    PT Start Time 1013    PT Stop Time 1100    PT Time Calculation (min) 47 min    Activity Tolerance Patient tolerated treatment well    Behavior During Therapy [facility] for tasks assessed/performed             Past Medical History:  Diagnosis Date   Allergy    Anemia    Colitis    Dyspareunia    Endometriosis    GERD (gastroesophageal reflux disease)    High cholesterol    Hx of abnormal Pap smear 1980's   Hypertension    Osteopenia    Ulcer    Urinary incontinence     Past Surgical History:  Procedure Laterality Date   ABDOMINAL HYSTERECTOMY  09/2008   TVH/BSO--Dr. Quincy Simmonds with TVT   BLADDER SUSPENSION  09/2008   Dr. Quincy Simmonds   CERVIX LESION DESTRUCTION  1980   hx abnormal pap--dysplasia   COLONOSCOPY WITH PROPOFOL Left 10/25/2017   Procedure: COLONOSCOPY WITH PROPOFOL;  Surgeon: Ronnette Juniper, MD;  [facility];  Service: Gastroenterology;  Laterality: Left;    There were no vitals filed for this visit.   Subjective Assessment - 05/24/21 1018     Subjective Conitnue to have some increased left neck pain, very sore today    Currently in Pain? Yes    Pain Score 6     Pain Location Neck    Pain Orientation Left    Pain Descriptors / Indicators Sore;Tightness;Spasm    Aggravating Factors  stress                               [facility] Adult PT Treatment/Exercise - 05/24/21 0001       Neck Exercises: Machines for Strengthening   UBE (Upper Arm Bike) L2.5.  66min fwd/3 backward    Cybex Row  20# 2x10    Lat Pull 20# 2x10    Other Machines for Strengthening 5# 2x10 leg extension      Manual Therapy   Manual Therapy Soft tissue mobilization;Passive ROM;Manual Traction    Soft tissue mobilization to the left upper trap, rhomboid and the cervical area    Manual Traction occipital release                      PT Short Term Goals - 04/20/21 1012       PT SHORT TERM GOAL #1   Title independent with initial HEP    Status Achieved               PT Long Term Goals - 05/24/21 1204       PT LONG TERM GOAL #2   Title increase cervical ROM to WNL's    Status Achieved                   Plan - 05/24/21 1201     Clinical Impression Statement Patient with knots and tenderness in the left  upper trap and the neck area.  She again is under a lot of stress with some planning and organizing that she is doing for a family and HS reunion. She is getting stronger and moving better still with the pain and some ROM limitation    PT Next Visit Plan Progress strengthening and ROM/muscle flexibility    Consulted and Agree with Plan of Care Patient             Patient will benefit from skilled therapeutic intervention in order to improve the following deficits and impairments:  Decreased range of motion, Increased muscle spasms, Pain, Improper body mechanics, Decreased strength, Postural dysfunction  Visit Diagnosis: Cervicalgia  Pain in thoracic spine  Cramp and spasm  Muscle weakness (generalized)  Abnormal posture     Problem List Patient Active Problem List   Diagnosis Date Noted   Gastroesophageal reflux disease 06/06/2020   Atypical chest pain 04/21/2020   Closed nondisplaced fracture of proximal phalanx of lesser toe of left foot 11/19/2017   GI bleed 10/23/2017   Hypokalemia 10/23/2017   Nausea vomiting and diarrhea 10/23/2017   Hematochezia 10/23/2017   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 [place], [place], [address] Phone: [phone]   Fax:  [phone]  Name: ADALENE GULOTTA MRN: [id number] Date of Birth: Dec 19, 1950

## 2021-05-28 ENCOUNTER — Ambulatory Visit: Payer: Medicare Other | Admitting: Physical Therapy

## 2021-05-28 ENCOUNTER — Encounter: Payer: Self-pay | Admitting: Physical Therapy

## 2021-05-28 ENCOUNTER — Other Ambulatory Visit: Payer: Self-pay

## 2021-05-28 DIAGNOSIS — M6281 Muscle weakness (generalized): Secondary | ICD-10-CM

## 2021-05-28 DIAGNOSIS — M546 Pain in thoracic spine: Secondary | ICD-10-CM

## 2021-05-28 DIAGNOSIS — M542 Cervicalgia: Secondary | ICD-10-CM | POA: Diagnosis not present

## 2021-05-28 DIAGNOSIS — R252 Cramp and spasm: Secondary | ICD-10-CM

## 2021-05-28 DIAGNOSIS — R293 Abnormal posture: Secondary | ICD-10-CM

## 2021-05-28 NOTE — Therapy (Signed)
[place]. [place], [place], [address] Phone: [phone]   Fax:  [phone]  Physical Therapy Treatment  Patient Details  Name: Margaret [name] MRN: [id number] Date of Birth: 09-22-1951 Referring Provider (PT): Patrice Paradise   Encounter Date: 05/28/2021   PT End of Session - 05/28/21 1057     Visit Number 13    Date for PT Re-Evaluation 07/05/21    Authorization Type Medicare    PT Start Time 1012    PT Stop Time 1100    PT Time Calculation (min) 48 min    Activity Tolerance Patient tolerated treatment well    Behavior During Therapy [facility] for tasks assessed/performed             Past Medical History:  Diagnosis Date   Allergy    Anemia    Colitis    Dyspareunia    Endometriosis    GERD (gastroesophageal reflux disease)    High cholesterol    Hx of abnormal Pap smear 1980's   Hypertension    Osteopenia    Ulcer    Urinary incontinence     Past Surgical History:  Procedure Laterality Date   ABDOMINAL HYSTERECTOMY  09/2008   TVH/BSO--Dr. Quincy Simmonds with TVT   BLADDER SUSPENSION  09/2008   Dr. Quincy Simmonds   CERVIX LESION DESTRUCTION  1980   hx abnormal pap--dysplasia   COLONOSCOPY WITH PROPOFOL Left 10/25/2017   Procedure: COLONOSCOPY WITH PROPOFOL;  Surgeon: Ronnette Juniper, MD;  [facility];  Service: Gastroenterology;  Laterality: Left;    There were no vitals filed for this visit.   Subjective Assessment - 05/28/21 1016     Subjective Doing okay today    Currently in Pain? Yes    Pain Score 5     Pain Location Neck    Pain Descriptors / Indicators Sore    Pain Relieving Factors the treatment helps                               [facility] Adult PT Treatment/Exercise - 05/28/21 0001       Neck Exercises: Machines for Strengthening   UBE (Upper Arm Bike) L2.5.  40min fwd/3 backward    Cybex Row 20# 2x10    Lat Pull 20# 2x10    Other Machines for Strengthening 5#  2x10 leg extension, 5# hip abduction and extension, 30# resisted gait    Other Machines for Strengthening 20# 2x10 leg curls, 10# straight arm pulls, 20# leg press                      PT Short Term Goals - 04/20/21 1012       PT SHORT TERM GOAL #1   Title independent with initial HEP    Status Achieved               PT Long Term Goals - 05/28/21 1130       PT LONG TERM GOAL #2   Title increase cervical ROM to WNL's      PT LONG TERM GOAL #3   Title Patient to demonstrate B shoulder strength >/=4+/5.    Status Achieved                   Plan - 05/28/21 1058     Clinical Impression Statement Patient had a lot of demonstrable weakness in the hips on the resisted  gait and the hip abduction exercises, she really struggled with this.  She is under a lot of stress right now with doing a lot of planning for a family reunion and a high school reunion.    PT Next Visit Plan she is going to do 20 hours of driving and then a lot of planning and organizing for a reunioon prior to returning    Consulted and Agree with Plan of Care Patient             Patient will benefit from skilled therapeutic intervention in order to improve the following deficits and impairments:  Decreased range of motion, Increased muscle spasms, Pain, Improper body mechanics, Decreased strength, Postural dysfunction  Visit Diagnosis: Cervicalgia  Pain in thoracic spine  Cramp and spasm  Muscle weakness (generalized)  Abnormal posture     Problem List Patient Active Problem List   Diagnosis Date Noted   Gastroesophageal reflux disease 06/06/2020   Atypical chest pain 04/21/2020   Closed nondisplaced fracture of proximal phalanx of lesser toe of left foot 11/19/2017   GI bleed 10/23/2017   Hypokalemia 10/23/2017   Nausea vomiting and diarrhea 10/23/2017   Hematochezia 10/23/2017   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 [place], [place], [address] Phone: [phone]   Fax:  [phone]  Name: Margaret [name] MRN: [id number] Date of Birth: 1951-06-17

## 2021-06-27 ENCOUNTER — Ambulatory Visit: Payer: Medicare Other | Admitting: Physical Therapy

## 2021-07-02 ENCOUNTER — Other Ambulatory Visit: Payer: Self-pay

## 2021-07-02 ENCOUNTER — Encounter: Payer: Self-pay | Admitting: Physical Therapy

## 2021-07-02 ENCOUNTER — Ambulatory Visit: Payer: Medicare Other | Attending: Orthopaedic Surgery | Admitting: Physical Therapy

## 2021-07-02 DIAGNOSIS — M542 Cervicalgia: Secondary | ICD-10-CM | POA: Insufficient documentation

## 2021-07-02 DIAGNOSIS — M546 Pain in thoracic spine: Secondary | ICD-10-CM | POA: Diagnosis present

## 2021-07-02 DIAGNOSIS — M6281 Muscle weakness (generalized): Secondary | ICD-10-CM | POA: Insufficient documentation

## 2021-07-02 DIAGNOSIS — R293 Abnormal posture: Secondary | ICD-10-CM | POA: Diagnosis present

## 2021-07-02 DIAGNOSIS — R252 Cramp and spasm: Secondary | ICD-10-CM | POA: Insufficient documentation

## 2021-07-02 NOTE — Therapy (Signed)
[address]. [place], [place], [address] Phone: [phone]   Fax:  [phone]  Physical Therapy Treatment  Patient Details  Name: Margaret [name] MRN: [id number] Date of Birth: 17-Jan-1951 Referring Provider (PT): Patrice Paradise   Encounter Date: 07/02/2021   PT End of Session - 07/02/21 1047     Visit Number 71    PT Start Time 1010    PT Stop Time 1100    PT Time Calculation (min) 50 min    Activity Tolerance Patient tolerated treatment well    Behavior During Therapy [facility] for tasks assessed/performed             Past Medical History:  Diagnosis Date   Allergy    Anemia    Colitis    Dyspareunia    Endometriosis    GERD (gastroesophageal reflux disease)    High cholesterol    Hx of abnormal Pap smear 1980's   Hypertension    Osteopenia    Ulcer    Urinary incontinence     Past Surgical History:  Procedure Laterality Date   ABDOMINAL HYSTERECTOMY  09/2008   TVH/BSO--Dr. Quincy Simmonds with TVT   BLADDER SUSPENSION  09/2008   Dr. Quincy Simmonds   CERVIX LESION DESTRUCTION  1980   hx abnormal pap--dysplasia   COLONOSCOPY WITH PROPOFOL Left 10/25/2017   Procedure: COLONOSCOPY WITH PROPOFOL;  Surgeon: Ronnette Juniper, MD;  Location: [facility];  Service: Gastroenterology;  Laterality: Left;    There were no vitals filed for this visit.   Subjective Assessment - 07/02/21 1042     Subjective Patient was at a family reunion and had to drive to [place] and then came back and flew to a high school reunion in [place] and then came back.  She reports a lot of stress. some neck tension but overall doing well    Currently in Pain? Yes    Pain Score 3     Pain Location Neck    Pain Descriptors / Indicators Tightness    Aggravating Factors  stress                               [facility] Adult PT Treatment/Exercise - 07/02/21 0001       Self-Care   Self-Care Other Self-Care Comments    Other  Self-Care Comments  posture and body mechanics, stretches and HEP      Electrical Stimulation   Electrical Stimulation Location c/t area    Electrical Stimulation Action IFC    Electrical Stimulation Parameters supine    Electrical Stimulation Goals Pain      Manual Therapy   Manual Therapy Soft tissue mobilization;Passive ROM;Manual Traction    Manual therapy comments gentle cervical stretches    Soft tissue mobilization to the left upper trap, rhomboid and the cervical area    Manual Traction occipital release                      PT Short Term Goals - 04/20/21 1012       PT SHORT TERM GOAL #1   Title independent with initial HEP    Status Achieved               PT Long Term Goals - 07/02/21 1048       PT LONG TERM GOAL #1   Title Patient to be independent with advanced HEP.    Status  Achieved      PT LONG TERM GOAL #2   Title increase cervical ROM to WNL's    Status Achieved      PT LONG TERM GOAL #3   Title Patient to demonstrate B shoulder strength >/=4+/5.    Status Achieved      PT LONG TERM GOAL #4   Title report =/> 75% reduction in overall pain of neck and chest with daily activity.    Status Achieved      PT LONG TERM GOAL #5   Title Pt will be able to walk for fitness 20 min at a time without increasing pain.     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  She is very tight but feels that she is good and weathered the storm of all the stress and came through well    PT Next Visit Plan D/C with all goals met    Consulted and Agree with Plan of Care Patient             Patient will benefit from skilled therapeutic intervention in order to improve the following deficits and impairments:  Decreased range of motion, Increased muscle spasms, Pain, Improper body  mechanics, Decreased strength, Postural dysfunction  Visit Diagnosis: Cervicalgia  Pain in thoracic spine  Cramp and spasm  Muscle weakness (generalized)  Abnormal posture     Problem List Patient Active Problem List   Diagnosis Date Noted   Gastroesophageal reflux disease 06/06/2020   Atypical chest pain 04/21/2020   Closed nondisplaced fracture of proximal phalanx of lesser toe of left foot 11/19/2017   GI bleed 10/23/2017   Hypokalemia 10/23/2017   Nausea vomiting and diarrhea 10/23/2017   Hematochezia 10/23/2017   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 [place], [place], [address] Phone: [phone]   Fax:  [phone]  Name: Margaret [name] MRN: [id number] Date of Birth: 25-Dec-1950

## 2021-07-10 ENCOUNTER — Encounter (HOSPITAL_BASED_OUTPATIENT_CLINIC_OR_DEPARTMENT_OTHER): Payer: Self-pay | Admitting: *Deleted

## 2021-07-10 ENCOUNTER — Other Ambulatory Visit: Payer: Self-pay

## 2021-07-10 ENCOUNTER — Emergency Department (HOSPITAL_BASED_OUTPATIENT_CLINIC_OR_DEPARTMENT_OTHER)
Admission: EM | Admit: 2021-07-10 | Discharge: 2021-07-10 | Disposition: A | Discharge status: Home / Self Care | Payer: Medicare Other | Attending: Emergency Medicine | Admitting: Emergency Medicine

## 2021-07-10 ENCOUNTER — Emergency Department (HOSPITAL_BASED_OUTPATIENT_CLINIC_OR_DEPARTMENT_OTHER): Payer: Medicare Other

## 2021-07-10 DIAGNOSIS — R0602 Shortness of breath: Secondary | ICD-10-CM | POA: Diagnosis not present

## 2021-07-10 DIAGNOSIS — R079 Chest pain, unspecified: Secondary | ICD-10-CM

## 2021-07-10 DIAGNOSIS — M549 Dorsalgia, unspecified: Secondary | ICD-10-CM | POA: Insufficient documentation

## 2021-07-10 DIAGNOSIS — Z79899 Other long term (current) drug therapy: Secondary | ICD-10-CM | POA: Insufficient documentation

## 2021-07-10 DIAGNOSIS — I10 Essential (primary) hypertension: Secondary | ICD-10-CM | POA: Insufficient documentation

## 2021-07-10 DIAGNOSIS — R072 Precordial pain: Secondary | ICD-10-CM | POA: Insufficient documentation

## 2021-07-10 LAB — CBC WITH DIFFERENTIAL/PLATELET
Abs Immature Granulocytes: 0.01 10*3/uL (ref 0.00–0.07)
Basophils Absolute: 0 10*3/uL (ref 0.0–0.1)
Basophils Relative: 1 %
Eosinophils Absolute: 0.4 10*3/uL (ref 0.0–0.5)
Eosinophils Relative: 6 %
HCT: 38.8 % (ref 36.0–46.0)
Hemoglobin: 12.1 g/dL (ref 12.0–15.0)
Immature Granulocytes: 0 %
Lymphocytes Relative: 60 %
Lymphs Abs: 3.8 10*3/uL (ref 0.7–4.0)
MCH: 26.6 pg (ref 26.0–34.0)
MCHC: 31.2 g/dL (ref 30.0–36.0)
MCV: 85.3 fL (ref 80.0–100.0)
Monocytes Absolute: 0.5 10*3/uL (ref 0.1–1.0)
Monocytes Relative: 8 %
Neutro Abs: 1.6 10*3/uL — ABNORMAL LOW (ref 1.7–7.7)
Neutrophils Relative %: 25 %
Platelets: 257 10*3/uL (ref 150–400)
RBC: 4.55 MIL/uL (ref 3.87–5.11)
RDW: 15.4 % (ref 11.5–15.5)
WBC: 6.2 10*3/uL (ref 4.0–10.5)
nRBC: 0 % (ref 0.0–0.2)

## 2021-07-10 LAB — TROPONIN I (HIGH SENSITIVITY): Troponin I (High Sensitivity): 3 ng/L (ref <18)

## 2021-07-10 LAB — COMPREHENSIVE METABOLIC PANEL
ALT: 18 U/L (ref 0–44)
AST: 22 U/L (ref 15–41)
Albumin: 4.2 g/dL (ref 3.5–5.0)
Alkaline Phosphatase: 82 U/L (ref 38–126)
Anion gap: 7 (ref 5–15)
BUN: 16 mg/dL (ref 8–23)
CO2: 31 mmol/L (ref 22–32)
Calcium: 9.5 mg/dL (ref 8.9–10.3)
Chloride: 100 mmol/L (ref 98–111)
Creatinine, Ser: 0.86 mg/dL (ref 0.44–1.00)
GFR, Estimated: >60 mL/min (ref >60)
Glucose, Bld: 100 mg/dL — ABNORMAL HIGH (ref 70–99)
Potassium: 3.6 mmol/L (ref 3.5–5.1)
Sodium: 138 mmol/L (ref 135–145)
Total Bilirubin: 0.2 mg/dL — ABNORMAL LOW (ref 0.3–1.2)
Total Protein: 7.7 g/dL (ref 6.5–8.1)

## 2021-07-10 LAB — D-DIMER, QUANTITATIVE: D-Dimer, Quant: 0.28 ug/mL-FEU (ref 0.00–0.50)

## 2021-07-10 MED ORDER — LIDOCAINE VISCOUS HCL 2 % MT SOLN
15.0000 mL | Freq: Once | OROMUCOSAL | Status: AC
  Administered 2021-07-10: 15 mL via ORAL
  Filled 2021-07-10: qty 15

## 2021-07-10 MED ORDER — ALUM & MAG HYDROXIDE-SIMETH 200-200-20 MG/5ML PO SUSP
30.0000 mL | Freq: Once | ORAL | Status: AC
  Administered 2021-07-10: 30 mL via ORAL
  Filled 2021-07-10: qty 30

## 2021-07-10 MED ORDER — OMEPRAZOLE 40 MG PO CPDR: 40.0000 mg | DELAYED_RELEASE_CAPSULE | Freq: Every day | ORAL | 0 refills | Status: DC

## 2021-07-10 NOTE — ED Provider Notes (Signed)
[facility] EMERGENCY DEPARTMENT Provider Note   CSN: [id number] Arrival date & time: 07/10/21  1902     History Chief Complaint  Patient presents with   Chest Pain    Margaret [name] is a 70 y.o. female.  She has a history of hypertension and reflux.  She is complaining of 4 days of some burning chest pain and some shortness of breath.  She is on over-the-counter Pepcid without any improvement.  She has been doing a lot of traveling and is also under a lot of stress.  The history is provided by the patient.  Chest Pain Pain location:  Substernal area Pain quality: burning   Pain severity:  Moderate Onset quality:  Gradual Duration:  4 days Timing:  Intermittent Progression:  Unchanged Chronicity:  New Relieved by:  Nothing Worsened by:  Nothing Ineffective treatments:  Antacids Associated symptoms: back pain, heartburn and shortness of breath   Associated symptoms: no abdominal pain, no cough, no fever, no headache, no nausea and no vomiting   Risk factors: high cholesterol and hypertension   Risk factors: no coronary artery disease, no prior DVT/PE and no smoking       Past Medical History:  Diagnosis Date   Allergy    Anemia    Colitis    Dyspareunia    Endometriosis    GERD (gastroesophageal reflux disease)    High cholesterol    Hx of abnormal Pap smear 1980's   Hypertension    Osteopenia    Ulcer    Urinary incontinence     Patient Active Problem List   Diagnosis Date Noted   Gastroesophageal reflux disease 06/06/2020   Atypical chest pain 04/21/2020   Closed nondisplaced fracture of proximal phalanx of lesser toe of left foot 11/19/2017   GI bleed 10/23/2017   Hypokalemia 10/23/2017   Nausea vomiting and diarrhea 10/23/2017   Hematochezia 10/23/2017   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 Quincy Simmonds with TVT   BLADDER SUSPENSION  09/2008   Dr. Quincy Simmonds   CERVIX LESION DESTRUCTION  1980   hx abnormal pap--dysplasia   COLONOSCOPY WITH PROPOFOL Left 10/25/2017   Procedure: COLONOSCOPY WITH PROPOFOL;  Surgeon: Ronnette Juniper, MD;  Location: [facility];  Service: Gastroenterology;  Laterality: Left;     OB History     Gravida  3   Para  3   Term  3   Preterm      AB      Living  3      SAB      IAB      Ectopic      Multiple      Live Births              Family History  Problem Relation Age of Onset   Diabetes Mother    Hypertension Mother    Thyroid disease Sister    Seizures Brother    Diabetes Brother    Hypertension Brother    Diabetes Brother    Arthritis Other    Diabetes Other    Breast cancer Cousin     Social History   Tobacco Use   Smoking status: Never   Smokeless tobacco: Never  Vaping Use  Vaping Use: Never used  Substance Use Topics   Alcohol use: Yes    Comment: rare   Drug use: No    Home Medications Prior to Admission medications   Medication Sig Start Date End Date Taking? Authorizing Provider  Ascorbic Acid (VITAMIN C) 1000 MG tablet Take 1,000 mg by mouth daily.    [provider]  atorvastatin (LIPITOR) 10 MG tablet Take 10 mg by mouth daily.    [provider]  Cholecalciferol (VITAMIN D) 2000 UNITS CAPS Take 2,000 Units by mouth daily.     [provider]  cyclobenzaprine (FLEXERIL) 10 MG tablet Take 1 tablet (10 mg total) by mouth 3 (three) times daily as needed for muscle spasms. 10/19/20   Lucrezia Starch, MD  Ferrous Sulfate (IRON) 325 (65 Fe) MG TABS Take 325 mg by mouth daily.     [provider]  fluticasone (FLONASE) 50 MCG/ACT nasal spray Place 1 spray into both nostrils daily as needed for allergies or rhinitis.     [provider]   LINZESS 72 MCG capsule Take 72 mcg by mouth every morning. 10/18/20   [provider]  lisinopril-hydrochlorothiazide (PRINZIDE,ZESTORETIC) 10-12.5 MG per tablet Take 1 tablet by mouth daily. 05/05/15   [provider]  meloxicam (MOBIC) 15 MG tablet Take 1 tablet (15 mg total) by mouth daily. 09/20/20   Edrick Kins, DPM  methylPREDNISolone (MEDROL DOSEPAK) 4 MG TBPK tablet 6 day dose pack - take as directed 09/20/20   Edrick Kins, DPM  Multiple Vitamins-Minerals (MULTIVITAMIN ADULT PO) Take 1 tablet by mouth daily.    [provider]  omeprazole (PRILOSEC) 40 MG capsule Take 40 mg by mouth daily.    [provider]  zinc gluconate 50 MG tablet Take 50 mg by mouth daily.    [provider]    Allergies    Amoxicillin-pot clavulanate, Ciprofloxacin, Levofloxacin, Macrobid [nitrofurantoin], Pantoprazole sodium, and Sulfa antibiotics  Review of Systems   Review of Systems  Constitutional:  Negative for fever.  HENT:  Negative for sore throat.   Eyes:  Negative for visual disturbance.  Respiratory:  Positive for shortness of breath. Negative for cough.   Cardiovascular:  Positive for chest pain.  Gastrointestinal:  Positive for heartburn. Negative for abdominal pain, nausea and vomiting.  Genitourinary:  Negative for dysuria.  Musculoskeletal:  Positive for back pain.  Skin:  Negative for rash.  Neurological:  Negative for headaches.   Physical Exam Updated Vital Signs BP (!) 151/83 (BP Location: Right Arm)   Pulse 75   Temp 98.4 F (36.9 C) (Oral)   Resp 20   Ht '5\' 6"'$  (1.676 m)   Wt 77.6 kg   SpO2 99%   BMI 27.60 kg/m   Physical Exam Vitals and nursing note reviewed.  Constitutional:      General: She is not in acute distress.    Appearance: Normal appearance. She is well-developed.  HENT:     Head: Normocephalic and atraumatic.  Eyes:     Conjunctiva/sclera: Conjunctivae normal.  Cardiovascular:     Rate and Rhythm:  Normal rate and regular rhythm.     Heart sounds: No murmur heard. Pulmonary:     Effort: Pulmonary effort is normal. No respiratory distress.     Breath sounds: Normal breath sounds.  Abdominal:     Palpations: Abdomen is soft.     Tenderness: There is no abdominal tenderness. There is no guarding or rebound.  Musculoskeletal:  General: No deformity or signs of injury. Normal range of motion.     Cervical back: Neck supple.  Skin:    General: Skin is warm and dry.  Neurological:     General: No focal deficit present.     Mental Status: She is alert.     Sensory: No sensory deficit.     Motor: No weakness.    ED Results / Procedures / Treatments   Labs (all labs ordered are listed, but only abnormal results are displayed) Labs Reviewed  CBC WITH DIFFERENTIAL/PLATELET - Abnormal; Notable for the following components:      Result Value   Neutro Abs 1.6 (*)    All other components within normal limits  COMPREHENSIVE METABOLIC PANEL - Abnormal; Notable for the following components:   Glucose, Bld 100 (*)    Total Bilirubin 0.2 (*)    All other components within normal limits  D-DIMER, QUANTITATIVE  TROPONIN I (HIGH SENSITIVITY)    EKG EKG Interpretation  Date/Time:  Tuesday July 10 2021 19:12:36 EDT Ventricular Rate:  73 PR Interval:  162 QRS Duration: 92 QT Interval:  382 QTC Calculation: 420 R Axis:   74 Text Interpretation: Normal sinus rhythm Normal ECG No significant change since prior 12/21 Confirmed by Aletta Edouard [phone]) on 07/10/2021 7:15:47 PM  Radiology DG Chest 2 View  Result Date: 07/10/2021 CLINICAL DATA:  Chest pain. EXAM: CHEST - 2 VIEW COMPARISON:  October 19, 2020 FINDINGS: The heart size and mediastinal contours are within normal limits. Both lungs are clear. The visualized skeletal structures are unremarkable. IMPRESSION: No active cardiopulmonary disease. Electronically Signed   By: Virgina Norfolk M.D.   On: 07/10/2021 19:55     Procedures Procedures   Medications Ordered in ED Medications  alum & mag hydroxide-simeth (MAALOX/MYLANTA) 200-200-20 MG/5ML suspension 30 mL (has no administration in time range)    And  lidocaine (XYLOCAINE) 2 % viscous mouth solution 15 mL (has no administration in time range)    ED Course  I have reviewed the triage vital signs and the nursing notes.  Pertinent labs & imaging results that were available during my care of the patient were reviewed by me and considered in my medical decision making (see chart for details).    MDM Rules/Calculators/A&P                          This patient complains of burning chest pain and shortness of breath; this involves an extensive number of treatment Options and is a complaint that carries with it a high risk of complications and Morbidity. The differential includes ACS, pneumonia, pneumothorax, PE, vascular, reflux  I ordered, reviewed and interpreted labs, which included CBC with normal white count normal hemoglobin, chemistries and LFTs normal, troponins flat, D-dimer negative I ordered medication GI cocktail with improvement in her symptoms I ordered imaging studies which included chest x-ray and I independently    visualized and interpreted imaging which showed no acute findings Previous records obtained and reviewed in epic no recent admissions  After the interventions stated above, I reevaluated the patient and found her symptoms to be improved.  Her vitals are stable.  Reviewed work-up with her.  She is comfortable plan for outpatient work-up with her provider.  Will start on PPI.  Return instructions discussed   Final Clinical Impression(s) / ED Diagnoses Final diagnoses:  Nonspecific chest pain    Rx / DC Orders ED Discharge Orders  Ordered    omeprazole (PRILOSEC) 40 MG capsule  Daily        07/10/21 2225             Hayden Rasmussen, MD 07/11/21 [phone]

## 2021-07-10 NOTE — ED Notes (Signed)
Patient transported to X-ray 

## 2021-07-10 NOTE — ED Triage Notes (Signed)
C/o chest pain described and burning x 3 days also c/o mid back pain  and SOB , recent long travel

## 2021-07-10 NOTE — Discharge Instructions (Signed)
You are seen in the emergency department for burning chest pain and shortness of breath.  You had lab work done EKG and a chest x-ray that did not show any serious findings.  We are starting you on some acid medication.  Please follow-up with your primary care doctor.  Return to the emergency department if any worsening or concerning symptoms

## 2021-08-15 ENCOUNTER — Ambulatory Visit: Payer: Federal, State, Local not specified - PPO | Admitting: Obstetrics and Gynecology

## 2021-08-28 ENCOUNTER — Other Ambulatory Visit: Payer: Self-pay

## 2021-08-28 ENCOUNTER — Encounter: Payer: Self-pay | Admitting: Obstetrics and Gynecology

## 2021-08-28 ENCOUNTER — Ambulatory Visit (INDEPENDENT_AMBULATORY_CARE_PROVIDER_SITE_OTHER): Payer: Medicare Other | Admitting: Obstetrics and Gynecology

## 2021-08-28 VITALS — BP 122/74 | HR 78 | Ht 65.5 in | Wt 171.0 lb

## 2021-08-28 DIAGNOSIS — Z01419 Encounter for gynecological examination (general) (routine) without abnormal findings: Secondary | ICD-10-CM

## 2021-08-28 DIAGNOSIS — R0789 Other chest pain: Secondary | ICD-10-CM
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
## 2021-08-28 NOTE — Patient Instructions (Signed)

## 2021-09-19 ENCOUNTER — Other Ambulatory Visit: Payer: Self-pay | Admitting: Family Medicine

## 2021-09-19 DIAGNOSIS — Z1231 Encounter for screening mammogram for malignant neoplasm of breast: Secondary | ICD-10-CM

## 2021-09-28 ENCOUNTER — Telehealth (HOSPITAL_COMMUNITY): Payer: Self-pay

## 2021-09-28 NOTE — Telephone Encounter (Signed)
Attempted to contact patient to schedule OP MBS - left voicemail. ?

## 2021-10-08 ENCOUNTER — Telehealth (HOSPITAL_COMMUNITY): Payer: Self-pay

## 2021-10-08 NOTE — Telephone Encounter (Signed)
2nd attempt to contact patient to schedule OP MBS - left voicemail. ?

## 2021-10-09 ENCOUNTER — Other Ambulatory Visit (HOSPITAL_COMMUNITY): Payer: Self-pay

## 2021-10-09 DIAGNOSIS — R1310 Dysphagia, unspecified: Secondary | ICD-10-CM

## 2021-10-19 ENCOUNTER — Ambulatory Visit (HOSPITAL_COMMUNITY): Payer: Federal, State, Local not specified - PPO

## 2021-10-19 ENCOUNTER — Encounter (HOSPITAL_COMMUNITY): Payer: Federal, State, Local not specified - PPO

## 2021-10-23 ENCOUNTER — Ambulatory Visit (HOSPITAL_COMMUNITY): Payer: Federal, State, Local not specified - PPO

## 2021-10-23 ENCOUNTER — Encounter (HOSPITAL_COMMUNITY): Payer: Self-pay

## 2021-10-23 ENCOUNTER — Encounter (HOSPITAL_COMMUNITY): Payer: Federal, State, Local not specified - PPO

## 2021-10-24 ENCOUNTER — Encounter: Payer: Self-pay | Admitting: Obstetrics and Gynecology

## 2021-10-24 ENCOUNTER — Other Ambulatory Visit (HOSPITAL_COMMUNITY)
Admission: RE | Admit: 2021-10-24 | Discharge: 2021-10-24 | Disposition: A | Discharge status: Home / Self Care | Payer: Medicare Other | Source: Ambulatory Visit | Attending: Obstetrics and Gynecology | Admitting: Obstetrics and Gynecology

## 2021-10-24 ENCOUNTER — Other Ambulatory Visit: Payer: Self-pay

## 2021-10-24 ENCOUNTER — Ambulatory Visit (INDEPENDENT_AMBULATORY_CARE_PROVIDER_SITE_OTHER): Payer: Medicare Other | Admitting: Obstetrics and Gynecology

## 2021-10-24 VITALS — BP 110/64 | Ht 65.5 in | Wt 171.0 lb

## 2021-10-24 DIAGNOSIS — R10A2 Flank pain, left side: Secondary | ICD-10-CM

## 2021-10-24 DIAGNOSIS — R109 Unspecified abdominal pain: Secondary | ICD-10-CM

## 2021-10-24 DIAGNOSIS — N760 Acute vaginitis: Secondary | ICD-10-CM | POA: Insufficient documentation

## 2021-10-24 DIAGNOSIS — R300 Dysuria: Secondary | ICD-10-CM

## 2021-10-24 LAB — URINALYSIS, COMPLETE W/RFL CULTURE
Bacteria, UA: NONE SEEN /HPF
Bilirubin Urine: NEGATIVE
Glucose, UA: NEGATIVE
Hgb urine dipstick: NEGATIVE
Hyaline Cast: NONE SEEN /LPF
Ketones, ur: NEGATIVE
Leukocyte Esterase: NEGATIVE
Nitrites, Initial: NEGATIVE
Protein, ur: NEGATIVE
RBC / HPF: NONE SEEN /HPF (ref 0–2)
Specific Gravity, Urine: 1.02 (ref 1.001–1.035)
WBC, UA: NONE SEEN /HPF (ref 0–5)
pH: 6 (ref 5.0–8.0)

## 2021-10-24 LAB — NO CULTURE INDICATED

## 2021-10-24 MED ORDER — TRIAMCINOLONE ACETONIDE 0.025 % EX OINT: 1.0000 "application " | TOPICAL_OINTMENT | Freq: Two times a day (BID) | CUTANEOUS | 0 refills | Status: DC

## 2021-10-24 NOTE — Progress Notes (Signed)
GYNECOLOGY  VISIT   HPI: 70 y.o.   Divorced  [place] American  female   [phone] with No LMP recorded. Patient has had a hysterectomy.   here for dysuria, vaginal irritation, left flank pain and low back pain. Urine has an odor.  Maybe some vaginal odor.  No discharge.   Symptoms started one week after taking a bath with Epsom salts and lavender in it. She has used A and D ointment and hydrocortisone cream.  Also treating with cranberry juice.  No over the counter Monistat treatment.  Not sexually active.   Some pain with urination.  Some urgency and frequency, but this is improved.  Some leakage of urine.  No blood in her urine.   Not feeling well. No know fever.   No vomiting.  Had an endoscopy a week ago due to acid reflux.  She also has some concerns about her vit B12.   GYNECOLOGIC HISTORY: No LMP recorded. Patient has had a hysterectomy. Contraception:  Hyst Menopausal hormone therapy:  none Last mammogram:  11-16-20  3D/Neg/BiRads1 Last pap smear:  Years ago prior to hysterectomy         OB History     Gravida  3   Para  3   Term  3   Preterm      AB      Living  3      SAB      IAB      Ectopic      Multiple      Live Births                 Patient Active Problem List   Diagnosis Date Noted   Gastroesophageal reflux disease 06/06/2020   Atypical chest pain 04/21/2020   Closed nondisplaced fracture of proximal phalanx of lesser toe of left foot 11/19/2017   GI bleed 10/23/2017   Hypokalemia 10/23/2017   Nausea vomiting and diarrhea 10/23/2017   Hematochezia 10/23/2017   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 Quincy Simmonds with TVT   BLADDER SUSPENSION  09/2008   Dr. Quincy Simmonds   CERVIX LESION DESTRUCTION  1980   hx abnormal pap--dysplasia   COLONOSCOPY WITH PROPOFOL Left 10/25/2017   Procedure: COLONOSCOPY WITH PROPOFOL;  Surgeon: Ronnette Juniper, MD;  Location: [facility];  Service: Gastroenterology;  Laterality: Left;    Current Outpatient Medications  Medication Sig Dispense Refill   Ascorbic Acid (VITAMIN C) 1000 MG tablet Take 1,000 mg by mouth daily.     atorvastatin (LIPITOR) 10 MG tablet Take 10 mg by mouth daily.     cetirizine (ZYRTEC) 10 MG tablet 1 tablet     Cholecalciferol (VITAMIN D) 2000 UNITS CAPS Take 6,000 Units by mouth daily.     docusate sodium (COLACE) 100 MG capsule 1 capsule as needed     Ferrous Sulfate (IRON)  325 (65 Fe) MG TABS Take 325 mg by mouth daily.      fluticasone (FLONASE) 50 MCG/ACT nasal spray Place 1 spray into both nostrils daily as needed for allergies or rhinitis.      Garlic Oil 1194 MG CAPS 1 capsule     hydrocortisone 2.5 % cream Apply 1 application topically at bedtime.     lisinopril-hydrochlorothiazide (ZESTORETIC) 10-12.5 MG tablet 1 tablet     Magnesium 250 MG TABS 1 tablet with a meal     methylPREDNISolone (MEDROL DOSEPAK) 4 MG TBPK tablet 6 day dose pack - take as directed 21 tablet 0   Multiple Vitamins-Minerals (MULTIVITAMIN ADULT PO) Take 1 tablet by mouth daily.     omeprazole (PRILOSEC) 40 MG capsule Take 1 capsule (40 mg total) by mouth daily. 30 capsule 0   omeprazole (PRILOSEC) 40 MG capsule 1 capsule     No current facility-administered medications for this visit.     ALLERGIES: Amoxicillin-pot clavulanate, Ciprofloxacin, Levofloxacin, Macrobid [nitrofurantoin], Pantoprazole sodium, and Sulfa antibiotics  Family History   Problem Relation Age of Onset   Diabetes Mother    Hypertension Mother    Thyroid disease Sister    Seizures Brother    Diabetes Brother    Hypertension Brother    Diabetes Brother    Arthritis Other    Diabetes Other    Breast cancer Cousin     Social History   Socioeconomic History   Marital status: Divorced    Spouse name: Not on file   Number of children: Not on file   Years of education: college   Highest education level: Not on file  Occupational History   Not on file  Tobacco Use   Smoking status: Never   Smokeless tobacco: Never  Vaping Use   Vaping Use: Never used  Substance and Sexual Activity   Alcohol use: Yes    Comment: rare   Drug use: No   Sexual activity: Not Currently    Birth control/protection: Surgical    Comment: TAH/BSO  Other Topics Concern   Not on file  Social History Narrative   Not on file   Social Determinants of Health   Financial Resource Strain: Not on file  Food Insecurity: Not on file  Transportation Needs: Not on file  Physical Activity: Not on file  Stress: Not on file  Social Connections: Not on file  Intimate Partner Violence: Not on file    Review of Systems  Genitourinary:  Positive for dysuria and flank pain (let).  All other systems reviewed and are negative.  PHYSICAL EXAMINATION:    BP 110/64    Ht 5' 5.5" (1.664 m)    Wt 171 lb (77.6 kg)    BMI 28.02 kg/m     General appearance: alert, cooperative and appears stated age   Abdomen: soft, non-tender, no masses,  no organomegaly Back:  no CVA tenderness. No abnormal inguinal nodes palpated   Pelvic: External genitalia:  no lesions              Urethra:  normal appearing urethra with no masses, tenderness or lesions              Bartholins and Skenes: normal                 Vagina: normal appearing vagina with normal color and discharge, no lesions              Cervix: absent  Bimanual Exam:  Uterus:  absent              Adnexa: no mass,  fullness, tenderness              Chaperone was present for exam:  Estill Bamberg, CMA  ASSESSMENT  Status post TVH/BSO/TVT.  Status post anterior colporrhaphy with biological mesh.  Hx of vaginal atrophy.  Vulvovaginitis. Left flank pain.  Possible musculoskeletal from her new work out.  Dysuria.  PLAN  Vaginitis testing.  Triamcinolone ointment.   Urinalysis:  sg 1.020, ph 6.0, 6 - 10 squams, negative for everything.  No UC sent.  Rest, heat, and ibuprofen for back/flank pain.  See PCP if flank pain continues.    An After Visit Summary was printed and given to the patient.  22 min  total time was spent for this patient encounter, including preparation, face-to-face counseling with the patient, coordination of care, and documentation of the encounter.

## 2021-10-25 LAB — CERVICOVAGINAL ANCILLARY ONLY
Bacterial Vaginitis (gardnerella): NEGATIVE
Candida Glabrata: NEGATIVE
Candida Vaginitis: NEGATIVE
Comment: NEGATIVE
Comment: NEGATIVE
Comment: NEGATIVE
Comment: NEGATIVE
Trichomonas: NEGATIVE

## 2021-10-25 MED ORDER — TRANEXAMIC ACID-NACL 1000-0.7 MG/100ML-% IV SOLN
INTRAVENOUS | Status: AC
  Filled 2021-10-25: qty 100

## 2021-11-19 ENCOUNTER — Ambulatory Visit
Admission: RE | Admit: 2021-11-19 | Discharge: 2021-11-19 | Disposition: A | Discharge status: Home / Self Care | Payer: Medicare Other | Source: Ambulatory Visit | Attending: Family Medicine | Admitting: Family Medicine

## 2021-11-19 DIAGNOSIS — Z1231 Encounter for screening mammogram for malignant neoplasm of breast: Secondary | ICD-10-CM

## 2022-03-24 ENCOUNTER — Emergency Department (HOSPITAL_BASED_OUTPATIENT_CLINIC_OR_DEPARTMENT_OTHER)
Admission: EM | Admit: 2022-03-24 | Discharge: 2022-03-24 | Disposition: A | Discharge status: Home / Self Care | Payer: Medicare Other | Attending: Emergency Medicine | Admitting: Emergency Medicine

## 2022-03-24 ENCOUNTER — Telehealth: Payer: Self-pay | Admitting: Sports Medicine

## 2022-03-24 ENCOUNTER — Emergency Department (HOSPITAL_BASED_OUTPATIENT_CLINIC_OR_DEPARTMENT_OTHER): Payer: Medicare Other

## 2022-03-24 ENCOUNTER — Other Ambulatory Visit: Payer: Self-pay

## 2022-03-24 ENCOUNTER — Encounter (HOSPITAL_BASED_OUTPATIENT_CLINIC_OR_DEPARTMENT_OTHER): Payer: Self-pay | Admitting: Emergency Medicine

## 2022-03-24 DIAGNOSIS — S92351A Displaced fracture of fifth metatarsal bone, right foot, initial encounter for closed fracture: Secondary | ICD-10-CM | POA: Diagnosis not present

## 2022-03-24 DIAGNOSIS — S99921A Unspecified injury of right foot, initial encounter: Secondary | ICD-10-CM | POA: Diagnosis present

## 2022-03-24 DIAGNOSIS — W010XXA Fall on same level from slipping, tripping and stumbling without subsequent striking against object, initial encounter: Secondary | ICD-10-CM | POA: Diagnosis not present

## 2022-03-24 DIAGNOSIS — W19XXXA Unspecified fall, initial encounter: Secondary | ICD-10-CM | POA: Insufficient documentation

## 2022-03-24 MED ORDER — OXYCODONE-ACETAMINOPHEN 5-325 MG PO TABS: 1.0000 | ORAL_TABLET | Freq: Four times a day (QID) | ORAL | 0 refills | Status: AC | PRN

## 2022-03-24 MED ORDER — OXYCODONE-ACETAMINOPHEN 5-325 MG PO TABS
1.0000 | ORAL_TABLET | Freq: Once | ORAL | Status: AC
  Administered 2022-03-24: 1 via ORAL
  Filled 2022-03-24: qty 1

## 2022-03-24 NOTE — Discharge Instructions (Addendum)
Please call the podiatry office tomorrow morning to request close follow-up appointment.  They recommended no weightbearing in your right leg for now.  Use boot.  Use crutches as needed or strongly recommend using a knee scooter.  Please get one as soon as possible tomorrow morning. ?

## 2022-03-24 NOTE — ED Provider Notes (Signed)
?[place] EMER[facility]ENCY DEPARTMENT ?Provider Note ? ? ?CSN: [id number] ?Arrival date & time: 03/24/22  2126 ? ?  ? ?History ? ?Chief Complaint  ?Patient presents with  ? Fall  ? ? ?Margaret [name] is a 71 y.o. female.  Presented to ER due to concern for ankle/foot injury.  Patient states that when she went to get up to let the dog out, she tripped and twisted and fell on her right foot/ankle.  Has been having significant pain, not able to bear weight in her right foot.  Worse with movement, improved with rest.  Denies hitting her head.  She thought initially that her right foot had fallen asleep and that caused her to fall.  She denies any ongoing numbness or tingling sensation in her leg or foot. ? ?HPI ? ?  ? ?Home Medications ?Prior to Admission medications   ?Medication Sig Start Date End Date Taking? Authorizing Provider  ?oxyCODONE-acetaminophen (PERCOCET/ROXICET) 5-325 MG tablet Take 1 tablet by mouth every 6 (six) hours as needed for up to 3 days for severe pain. 03/24/22 03/27/22 Yes Avery Eustice, Ellwood Dense, MD  ?Ascorbic Acid (VITAMIN C) 1000 MG tablet Take 1,000 mg by mouth daily.    [provider]  ?atorvastatin (LIPITOR) 10 MG tablet Take 10 mg by mouth daily.    [provider]  ?cetirizine (ZYRTEC) 10 MG tablet 1 tablet    [provider]  ?Cholecalciferol (VITAMIN D) 2000 UNITS CAPS Take 6,000 Units by mouth daily.    [provider]  ?docusate sodium (COLACE) 100 MG capsule 1 capsule as needed    [provider]  ?Ferrous Sulfate (IRON) 325 (65 Fe) MG TABS Take 325 mg by mouth daily.     [provider]  ?fluticasone (FLONASE) 50 MCG/ACT nasal spray Place 1 spray into both nostrils daily as needed for allergies or rhinitis.     [provider]  ?Garlic Oil 1275 MG CAPS 1 capsule    [provider]  ?hydrocortisone 2.5 % cream Apply 1 application topically at bedtime. 10/18/21   [provider]   ?lisinopril-hydrochlorothiazide (ZESTORETIC) 10-12.5 MG tablet 1 tablet    [provider]  ?Magnesium 250 MG TABS 1 tablet with a meal    [provider]  ?methylPREDNISolone (MEDROL DOSEPAK) 4 MG TBPK tablet 6 day dose pack - take as directed 09/20/20   Edrick Kins, DPM  ?Multiple Vitamins-Minerals (MULTIVITAMIN ADULT PO) Take 1 tablet by mouth daily.    [provider]  ?omeprazole (PRILOSEC) 40 MG capsule Take 1 capsule (40 mg total) by mouth daily. 07/10/21   Hayden Rasmussen, MD  ?omeprazole (PRILOSEC) 40 MG capsule 1 capsule    [provider]  ?triamcinolone (KENALOG) 0.025 % ointment Apply 1 application topically 2 (two) times daily. Use for 2 weeks as needed. 10/24/21   Nunzio Cobbs, MD  ?   ? ?Allergies    ?Amoxicillin-pot clavulanate, Ciprofloxacin, Levofloxacin, Macrobid [nitrofurantoin], Pantoprazole sodium, and Sulfa antibiotics   ? ?Review of Systems   ?Review of Systems  ?Musculoskeletal:  Positive for arthralgias.  ?All other systems reviewed and are negative. ? ?Physical Exam ?Updated Vital Signs ?BP 121/72   Pulse 60   Temp 98.6 ?F (37 ?C) (Oral)   Resp 17   Ht 5' 5.5" (1.664 m)   Wt 76.2 kg   SpO2 98%   BMI 27.53 kg/m?  ?Physical Exam ?Vitals and nursing note reviewed.  ?Constitutional:   ?  General: She is not in acute distress. ?   Appearance: She is well-developed.  ?HENT:  ?   Head: Normocephalic and atraumatic.  ?Eyes:  ?   Conjunctiva/sclera: Conjunctivae normal.  ?Cardiovascular:  ?   Rate and Rhythm: Normal rate and regular rhythm.  ?   Heart sounds: No murmur heard. ?Pulmonary:  ?   Effort: Pulmonary effort is normal. No respiratory distress.  ?Musculoskeletal:  ?   Cervical back: Neck supple.  ?   Comments: Right lower extremity: There is some mild swelling and tenderness to the midfoot, tenderness over base of fifth metatarsal bone, mild tenderness to the ankle  ?Skin: ?   General: Skin is warm and dry.  ?   Capillary  Refill: Capillary refill takes less than 2 seconds.  ?Neurological:  ?   General: No focal deficit present.  ?   Mental Status: She is alert.  ?   Comments: Sensation intact throughout entirety of foot, distal motor intact  ?Psychiatric:     ?   Mood and Affect: Mood normal.  ? ? ?ED Results / Procedures / Treatments   ?Labs ?(all labs ordered are listed, but only abnormal results are displayed) ?Labs Reviewed - No data to display ? ?EKG ?None ? ?Radiology ?DG Ankle Complete Right ? ?Result Date: 03/24/2022 ?CLINICAL DATA:  Ankle pain following fall, initial encounter EXAM: RIGHT ANKLE - COMPLETE 3+ VIEW COMPARISON:  None Available. FINDINGS: Known fifth metatarsal fracture is again seen and stable. Calcaneal spurring is noted. No other fracture or dislocation is noted. No soft tissue changes are seen. IMPRESSION: Fifth metatarsal fracture.  No other focal abnormality is noted. Electronically Signed   By: Inez Catalina M.D.   On: 03/24/2022 22:10  ? ?DG Foot Complete Right ? ?Result Date: 03/24/2022 ?CLINICAL DATA:  Recent fall with right foot pain, initial encounter EXAM: RIGHT FOOT COMPLETE - 3+ VIEW COMPARISON:  None Available. FINDINGS: Minimally displaced fracture is noted the base of the fifth metatarsal. Mild hallux valgus deformity is noted. No other focal abnormality is seen. Mild calcaneal spurring is noted. IMPRESSION: Fracture at the base of the fifth metatarsal with mild displacement. Electronically Signed   By: Inez Catalina M.D.   On: 03/24/2022 22:10   ? ?Procedures ?Procedures  ? ? ?Medications Ordered in ED ?Medications  ?oxyCODONE-acetaminophen (PERCOCET/ROXICET) 5-325 MG per tablet 1 tablet (1 tablet Oral Given 03/24/22 2257)  ? ? ?ED Course/ Medical Decision Making/ A&P ?  ?                        ?Medical Decision Making ?Amount and/or Complexity of Data Reviewed ?Radiology: ordered. ? ?Risk ?Prescription drug management. ? ? ?71 year old lady presents to ER due to concern for right foot/ankle  injury.  Patient has a fracture at the base of the fifth metatarsal with mild displacement.  Based on my review of the x-ray I am concern for Jones fracture though it is on the border between [place] and pseudo Jones.  Patient has previously seen Dr. Amalia Hailey for unrelated issue in the [facility].  I discussed the case with Dr. Cannon Kettle on-call for podiatry.  She advises cam boot, nonweightbearing, strongly encourage his use of knee scooter and follow-up in clinic.  I discussed these recommendations with patient.  We will give cam boot, crutches for now and instructed to get a knee scooter tomorrow and follow-up with podiatrist. ? ? ? ?After the discussed management above, the  patient was determined to be safe for discharge.  The patient was in agreement with this plan and all questions regarding their care were answered.  ED return precautions were discussed and the patient will return to the ED with any significant worsening of condition. ? ? ? ? ? ? ? ? ?Final Clinical Impression(s) / ED Diagnoses ?Final diagnoses:  ?Closed fracture of base of fifth metatarsal bone of right foot, initial encounter  ? ? ?Rx / DC Orders ?ED Discharge Orders   ? ?      Ordered  ?  oxyCODONE-acetaminophen (PERCOCET/ROXICET) 5-325 MG tablet  Every 6 hours PRN       ? 03/24/22 2250  ? ?  ?  ? ?  ? ? ?  ?Lucrezia Starch, MD ?03/24/22 2308 ? ?

## 2022-03-24 NOTE — ED Notes (Signed)
D/c instructions reviewed and pt understands to call Dr. Amalia Hailey office in the am for follow up appointment and understands that she is non weight bearing and encouraged her to get a knee scooter.  ?

## 2022-03-24 NOTE — ED Triage Notes (Signed)
Reports she got up to let the dog out and the right foot may have been asleep.  Pain in right foot.  Unable to bear weight. ?

## 2022-03-24 NOTE — Telephone Encounter (Signed)
Received phone call from [facility] ER from Dr. Roslynn Amble re: Right foot possible jones fracture. I advised him to place patient in a cam boot and to encourage nonweightbearing with knee scooter since he is not confident that patient can use crutches. Office will contact patient to arrange follow up appointment to further evaluate her fracture. ? ?Dr. Landis Martins ?On Call provider  ?[facility] ?[phone] office ?[phone] cell ?Available via secure chat  ? ?

## 2022-03-27 ENCOUNTER — Ambulatory Visit (INDEPENDENT_AMBULATORY_CARE_PROVIDER_SITE_OTHER): Payer: Medicare Other | Admitting: Podiatry

## 2022-03-27 DIAGNOSIS — S99191A Other physeal fracture of right metatarsal, initial encounter for closed fracture: Secondary | ICD-10-CM | POA: Diagnosis not present

## 2022-03-27 NOTE — Progress Notes (Signed)
? ?HPI: 71 y.o. female presenting today for evaluation of a fracture that was sustained on Mother's Day, 03/24/2022 when the patient twisted her foot at home.  She went to the urgent care on 03/24/2022 that same day and was diagnosed with a Jones fracture and instructed to follow-up here in the office.  She presents today wearing a cam boot.  She says that she has been using crutches at home.  She presents for further treatment and evaluation ? ?Past Medical History:  ?Diagnosis Date  ? Allergy   ? Anemia   ? Colitis   ? Dyspareunia   ? Endometriosis   ? GERD (gastroesophageal reflux disease)   ? High cholesterol   ? Hx of abnormal Pap smear 1980's  ? Hypertension   ? Osteopenia   ? Ulcer   ? Urinary incontinence   ? ? ?Past Surgical History:  ?Procedure Laterality Date  ? ABDOMINAL HYSTERECTOMY  09/2008  ? TVH/BSO--Dr. Quincy Simmonds with TVT  ? BLADDER SUSPENSION  09/2008  ? Dr. Quincy Simmonds  ? [place]  ? hx abnormal pap--dysplasia  ? COLONOSCOPY WITH PROPOFOL Left 10/25/2017  ? Procedure: COLONOSCOPY WITH PROPOFOL;  Surgeon: Ronnette Juniper, MD;  Location: [facility];  Service: Gastroenterology;  Laterality: Left;  ? ? ?Allergies  ?Allergen Reactions  ? Amoxicillin-Pot Clavulanate Nausea And Vomiting and Other (See Comments)  ? Ciprofloxacin Nausea And Vomiting and Other (See Comments)  ?  Other reaction(s): GI bleeding  ? Levofloxacin   ?  Other reaction(s): itchy,achey bones  ? Macrobid [Nitrofurantoin] Itching  ? Pantoprazole Sodium Other (See Comments)  ?  Other reaction(s): itching, joint pains  ? Sulfa Antibiotics Other (See Comments)  ?  Unknown ?Other reaction(s): do not remember  ? ?  ?Physical Exam: ?General: The patient is alert and oriented x3 in no acute distress. ? ?Dermatology: Skin is warm, dry and supple bilateral lower extremities. Negative for open lesions or macerations. ? ?Vascular: Palpable pedal pulses bilaterally. Capillary refill within normal limits.  Edema noted right  foot ? ?Neurological: Light touch and protective threshold grossly intact ? ?Musculoskeletal Exam: No pedal deformities noted.  Exquisite tenderness to light touch to the lateral aspect of the right foot at the base of the fifth metatarsal ? ?Radiographic Exam RT foot 03/24/2022:  ?FINDINGS: ?Minimally displaced fracture is noted the base of the fifth ?metatarsal. Mild hallux valgus deformity is noted. No other focal ?abnormality is seen. Mild calcaneal spurring is noted. ?  ?IMPRESSION: ?Fracture at the base of the fifth metatarsal with mild displacement. ? ?Assessment: ?1.  Jones fracture RT foot; closed, nondisplaced, initial encounter ? ? ?Plan of Care:  ?1. Patient evaluated. X-Rays reviewed.  We did discuss different treatment options both surgical and conservative today. ?2.  Today were going to pursue conservative treatment.  Continue strict nonweightbearing in the cam boot.  Patient is going to pick up a rolling walker and possible wheelchair ?3.  Recommend rest ice compression and elevation ?4.  Ace wrap's dispensed.  Ace wrap daily ?5.  Return to clinic in 6 weeks for follow-up x-ray ? ?*Patient leaves to visit [place], [place] 05/07/2022 ?  ?  ?Edrick Kins, DPM ?[place] ? ?Dr. Edrick Kins, DPM  ?  ?[address]                                        ?  [place], [place] [address]                ?Office [phone]  ?Fax [phone] ? ? ? ? ?

## 2022-03-28 ENCOUNTER — Telehealth: Payer: Self-pay | Admitting: Podiatry

## 2022-03-28 NOTE — Telephone Encounter (Signed)
Pt called and called she called her insurance and they will cover a walker and/or scooter but needs a rx. She is asking what you would recommend and once she get the rx she has to take to medical supply store to pick up.  Pt aware you are out of office today and in [place] office tomorrow.

## 2022-03-29 ENCOUNTER — Other Ambulatory Visit: Payer: Self-pay | Admitting: Podiatry

## 2022-03-29 DIAGNOSIS — S99191A Other physeal fracture of right metatarsal, initial encounter for closed fracture: Secondary | ICD-10-CM

## 2022-03-29 NOTE — Telephone Encounter (Signed)
I just placed an order for a knee scooter in the patient's chart.  You can go ahead and print this off and she can come into the office to pick it up to take to a medical supply store.  Thanks, Dr. Amalia Hailey

## 2022-04-01 ENCOUNTER — Encounter: Payer: Self-pay | Admitting: Podiatry

## 2022-04-01 ENCOUNTER — Telehealth: Payer: Self-pay | Admitting: *Deleted

## 2022-04-01 NOTE — Telephone Encounter (Signed)
Please advise 

## 2022-04-01 NOTE — Telephone Encounter (Signed)
Patient is calling to requesting that another pain medicine be prescribed, "oxycodone is a little too much for me."She is also calling for a prescription for a walker. Please advise

## 2022-04-02 ENCOUNTER — Other Ambulatory Visit: Payer: Self-pay | Admitting: Podiatry

## 2022-04-02 MED ORDER — TRAMADOL HCL 50 MG PO TABS: 50.0000 mg | ORAL_TABLET | Freq: Four times a day (QID) | ORAL | 0 refills | Status: AC | PRN

## 2022-04-02 NOTE — Progress Notes (Signed)
PRN

## 2022-04-02 NOTE — Telephone Encounter (Signed)
Patient notified

## 2022-04-02 NOTE — Telephone Encounter (Signed)
Rx tramadol sent to pharmacy. Please notify patient.

## 2022-04-15 ENCOUNTER — Telehealth: Payer: Self-pay | Admitting: *Deleted

## 2022-04-15 NOTE — Telephone Encounter (Signed)
[employer] is calling to let the physician know that Medicare nor [employer] medic[employer] cover crutch substitute. Will  receive a denial letter to follow this message.

## 2022-04-15 NOTE — Telephone Encounter (Signed)
Patient needs a new prescription for a walker ,  insurance will not approve the scooter.   [employer] will cover the walker)  Please call  -she would like to pick up prescription 04/16/22 in the afternoon

## 2022-04-16 ENCOUNTER — Other Ambulatory Visit: Payer: Self-pay | Admitting: Podiatry

## 2022-04-16 DIAGNOSIS — S99191A Other physeal fracture of right metatarsal, initial encounter for closed fracture: Secondary | ICD-10-CM

## 2022-04-16 NOTE — Telephone Encounter (Signed)
Called patient and let her know that we would have the order in an envelope down at the front office.

## 2022-04-16 NOTE — Telephone Encounter (Signed)
Order placed in the  patient's chart for a rolling walker.  Please print off the order and she can come in and pick it up.  Thanks, Dr. Amalia Hailey

## 2022-04-18 ENCOUNTER — Ambulatory Visit: Payer: Federal, State, Local not specified - PPO | Admitting: Cardiology

## 2022-04-18 NOTE — Progress Notes (Deleted)
Follow up visit  Subjective:   Margaret ERKKILA, female    DOB: [birth year], 71 y.o.   MRN: [id number]   HPI  No chief complaint on file.    71 year old African-American female with controlled hypertension, atypical chest pain.  ***   Current Outpatient Medications:    Ascorbic Acid (VITAMIN C) 1000 MG tablet, Take 1,000 mg by mouth daily., Disp: , Rfl:    atorvastatin (LIPITOR) 10 MG tablet, Take 10 mg by mouth daily., Disp: , Rfl:    cetirizine (ZYRTEC) 10 MG tablet, 1 tablet, Disp: , Rfl:    Cholecalciferol (VITAMIN D) 2000 UNITS CAPS, Take 6,000 Units by mouth daily., Disp: , Rfl:    docusate sodium (COLACE) 100 MG capsule, 1 capsule as needed, Disp: , Rfl:    Ferrous Sulfate (IRON) 325 (65 Fe) MG TABS, Take 325 mg by mouth daily. , Disp: , Rfl:    fluticasone (FLONASE) 50 MCG/ACT nasal spray, Place 1 spray into both nostrils daily as needed for allergies or rhinitis. , Disp: , Rfl:    Garlic Oil 7253 MG CAPS, 1 capsule, Disp: , Rfl:    hydrocortisone 2.5 % cream, Apply 1 application topically at bedtime., Disp: , Rfl:    lisinopril-hydrochlorothiazide (ZESTORETIC) 10-12.5 MG tablet, 1 tablet, Disp: , Rfl:    Magnesium 250 MG TABS, 1 tablet with a meal, Disp: , Rfl:    methylPREDNISolone (MEDROL DOSEPAK) 4 MG TBPK tablet, 6 day dose pack - take as directed, Disp: 21 tablet, Rfl: 0   Multiple Vitamins-Minerals (MULTIVITAMIN ADULT PO), Take 1 tablet by mouth daily., Disp: , Rfl:    omeprazole (PRILOSEC) 40 MG capsule, Take 1 capsule (40 mg total) by mouth daily., Disp: 30 capsule, Rfl: 0   omeprazole (PRILOSEC) 40 MG capsule, 1 capsule, Disp: , Rfl:    triamcinolone (KENALOG) 0.025 % ointment, Apply 1 application topically 2 (two) times daily. Use for 2 weeks as needed., Disp: 30 g, Rfl: 0  Cardiovascular & other pertient studies:  EKG 04/21/2020: Sinus rhythm 76 bpm Normal EKG    CT chest without contrast 01/28/2020: IMPRESSION: 1. No findings to explain the  patient's chest wall pain. 2. No acute cardiopulmonary process.  The lungs are clear.    Exercise Treadmill Stress Test 01/23/2018: Indication: Chest pain The patient exercised on Bruce protocol for  05:29 min. Patient achieved  7.05 METS and reached HR  176 bpm, which is  112 % of maximum age-predicted HR.  Stress test terminated due to SOB and fatigue.  Exercise capacity was below average for age. HR Response to Exercise: Appropriate. BP Response to Exercise: Resting hypertension- exaggerated response. Chest Pain: non-limiting. Arrhythmias: Occasional PVC and PACs. ST Changes: With peak exercise there was no ST-T changes of ischemia.  Overall Impression: Normal stress test.   Recent labs: 02/28/2020: Glucose 93, BUN/Cr 9/0.7. EGFR >60. Na/K 139/3.4.  H/H 11.5/37.2. MCV 86.7. Platelets 244 Trop HS 3  2018: Chol 181, TG 78, HDL 63, LDL 102   Review of Systems  Cardiovascular:  Positive for chest pain. Negative for dyspnea on exertion, leg swelling, palpitations and syncope.         There were no vitals filed for this visit.   There is no height or weight on file to calculate BMI. There were no vitals filed for this visit.    Objective:   Physical Exam Vitals and nursing note reviewed.  Constitutional:      General: She is not in acute distress. Neck:  Vascular: No JVD.  Cardiovascular:     Rate and Rhythm: Normal rate and regular rhythm.     Pulses: Intact distal pulses.     Heart sounds: Normal heart sounds. No murmur heard. Pulmonary:     Effort: Pulmonary effort is normal.     Breath sounds: Normal breath sounds. No wheezing or rales.  Musculoskeletal:        General: Tenderness (Chest wall tenderness) present.           Assessment & Recommendations:   71 year old African-American female with controlled hypertension, atypical chest pain.  Chest pain: Reviewed ED records. Normal EKG< troponin. CT Chest showed coronary calcification seen.   She had previously had normal exercise treadmill stress test in 2019 for similar chest pain.  Her history does not fit angina symptoms. I do not think further imaging/testing would provide any additional information regarding chest pain that is likely musculoskeletal.  I will see her on as needed basis.   Nigel Mormon, MD [facility] Pager: [phone] Office: [phone]

## 2022-05-02 ENCOUNTER — Ambulatory Visit: Payer: Federal, State, Local not specified - PPO | Admitting: Cardiology

## 2022-05-06 ENCOUNTER — Ambulatory Visit (INDEPENDENT_AMBULATORY_CARE_PROVIDER_SITE_OTHER): Payer: Medicare Other

## 2022-05-06 ENCOUNTER — Ambulatory Visit (INDEPENDENT_AMBULATORY_CARE_PROVIDER_SITE_OTHER): Payer: Medicare Other | Admitting: Podiatry

## 2022-05-06 DIAGNOSIS — S99191A Other physeal fracture of right metatarsal, initial encounter for closed fracture: Secondary | ICD-10-CM

## 2022-05-06 NOTE — Progress Notes (Signed)
HPI: 71 y.o. female presenting today for follow-up evaluation of a Jones  fracture that was sustained on Mother's Day, 03/24/2022 when the patient twisted her foot at home.  She went to the urgent care on 03/24/2022 that same day and was diagnosed with a Jones fracture and instructed to follow-up here in the office.    Patient has been wearing the cam boot for the last 6 weeks.  She says that overall there has been some improvement.  She presents for further treatment and evaluation  Past Medical History:  Diagnosis Date   Allergy    Anemia    Colitis    Dyspareunia    Endometriosis    GERD (gastroesophageal reflux disease)    High cholesterol    Hx of abnormal Pap smear 1980's   Hypertension    Osteopenia    Ulcer    Urinary incontinence     Past Surgical History:  Procedure Laterality Date   ABDOMINAL HYSTERECTOMY  09/2008   TVH/BSO--Dr. Edward Jolly with TVT   BLADDER SUSPENSION  09/2008   Dr. Edward Jolly   CERVIX LESION DESTRUCTION  1980   hx abnormal pap--dysplasia   COLONOSCOPY WITH PROPOFOL Left 10/25/2017   Procedure: COLONOSCOPY WITH PROPOFOL;  Surgeon: Kerin Salen, MD;  Location: [facility];  Service: Gastroenterology;  Laterality: Left;    Allergies  Allergen Reactions   Amoxicillin-Pot Clavulanate Nausea And Vomiting and Other (See Comments)   Ciprofloxacin Nausea And Vomiting and Other (See Comments)    Other reaction(s): GI bleeding   Levofloxacin     Other reaction(s): itchy,achey bones   Macrobid [Nitrofurantoin] Itching   Pantoprazole Sodium Other (See Comments)    Other reaction(s): itching, joint pains   Sulfa Antibiotics Other (See Comments)    Unknown Other reaction(s): do not remember     Physical Exam: General: The patient is alert and oriented x3 in no acute distress.  Dermatology: Skin is warm, dry and supple bilateral lower extremities. Negative for open lesions or macerations.  Vascular: Palpable pedal pulses bilaterally. Capillary refill within  normal limits.  Edema noted right foot  Neurological: Light touch and protective threshold grossly intact  Musculoskeletal Exam: No pedal deformities noted.  There continues to be some mild tenderness to light touch to the lateral aspect of the right foot at the base of the fifth metatarsal  Radiographic Exam RT foot 05/06/2022:  There does appear to be evidence of healing especially along the medial portion of the fracture site.  There is increased bone resorption to the lateral portion of the fracture site.  Assessment: 1.  Jones fracture RT foot; closed, nondisplaced, subsequent encounter   Plan of Care:  1. Patient evaluated. X-Rays reviewed.   2.  Overall there does not appear to be some improvement of the Jones fracture 3.  Today we are going to initiate authorization for an external bone stimulator. Rep from [employer] contacted 4.  Continue cam boot.  The patient does not need to drive.  She may drive as needed without the cam boot.  Postsurgical shoe was also dispensed that she may use intermittently 5.  Return to clinic 6 weeks for follow-up.  She is now leaving for [place], [place] 06/11/2022.  *Patient leaves to visit [place], [place] 06/11/2022     Felecia Shelling, DPM [facility]  Dr. Felecia Shelling, DPM    [address]  [place], [place] [address]                Office [phone]  Fax [phone]

## 2022-05-16 ENCOUNTER — Encounter: Payer: Self-pay | Admitting: Podiatry

## 2022-05-17 ENCOUNTER — Encounter: Payer: Self-pay | Admitting: Cardiology

## 2022-05-17 ENCOUNTER — Ambulatory Visit: Payer: Federal, State, Local not specified - PPO | Admitting: Cardiology

## 2022-05-17 VITALS — BP 91/66 | HR 82 | Temp 98.0°F | Resp 16 | Ht 65.0 in | Wt 166.0 lb

## 2022-05-17 DIAGNOSIS — I10 Essential (primary) hypertension: Secondary | ICD-10-CM

## 2022-05-17 DIAGNOSIS — R072 Precordial pain: Secondary | ICD-10-CM

## 2022-05-17 MED ORDER — NITROGLYCERIN 0.4 MG SL SUBL: 0.4000 mg | SUBLINGUAL_TABLET | SUBLINGUAL | 3 refills | Status: DC | PRN

## 2022-05-17 NOTE — Progress Notes (Signed)
Follow up visit  Subjective:   Margaret [name], female    DOB: Mar 30, 1951, 71 y.o.   MRN: [id number]   HPI  Chief Complaint  Patient presents with   Chest Pain   Hypertension   Follow-up    2 year   Shortness of Breath    71 year old African-American female with controlled hypertension, atypical chest pain.  Patient was last seen in 2021. She recently has had chest pain, usually in the mornings, unrelated to exertion. She recently underwent nuclear stress testing at [facility], results not available to me.     Current Outpatient Medications:    Ascorbic Acid (VITAMIN C) 1000 MG tablet, Take 1,000 mg by mouth daily., Disp: , Rfl:    atorvastatin (LIPITOR) 10 MG tablet, Take 10 mg by mouth daily., Disp: , Rfl:    cetirizine (ZYRTEC) 10 MG tablet, 1 tablet, Disp: , Rfl:    Cholecalciferol (VITAMIN D) 2000 UNITS CAPS, Take 6,000 Units by mouth daily., Disp: , Rfl:    docusate sodium (COLACE) 100 MG capsule, 1 capsule as needed, Disp: , Rfl:    Ferrous Sulfate (IRON) 325 (65 Fe) MG TABS, Take 325 mg by mouth daily. , Disp: , Rfl:    fluticasone (FLONASE) 50 MCG/ACT nasal spray, Place 1 spray into both nostrils daily as needed for allergies or rhinitis. , Disp: , Rfl:    Garlic Oil 8676 MG CAPS, 1 capsule, Disp: , Rfl:    hydrocortisone 2.5 % cream, Apply 1 application topically at bedtime., Disp: , Rfl:    lisinopril-hydrochlorothiazide (ZESTORETIC) 10-12.5 MG tablet, 1 tablet, Disp: , Rfl:    Magnesium 250 MG TABS, 1 tablet with a meal, Disp: , Rfl:    methylPREDNISolone (MEDROL DOSEPAK) 4 MG TBPK tablet, 6 day dose pack - take as directed, Disp: 21 tablet, Rfl: 0   Multiple Vitamins-Minerals (MULTIVITAMIN ADULT PO), Take 1 tablet by mouth daily., Disp: , Rfl:    omeprazole (PRILOSEC) 40 MG capsule, Take 1 capsule (40 mg total) by mouth daily., Disp: 30 capsule, Rfl: 0   omeprazole (PRILOSEC) 40 MG capsule, 1 capsule, Disp: , Rfl:    triamcinolone (KENALOG) 0.025 %  ointment, Apply 1 application topically 2 (two) times daily. Use for 2 weeks as needed., Disp: 30 g, Rfl: 0    Cardiovascular & other pertient studies:  EKG 05/17/2022: Sinus rhythm 87 bpm Normal EKG  CT chest without contrast 2021: IMPRESSION: 1. No findings to explain the patient's chest wall pain. 2. No acute cardiopulmonary process.  The lungs are clear.    Exercise Treadmill Stress Test 2019: Indication: Chest pain The patient exercised on Bruce protocol for  05:29 min. Patient achieved  7.05 METS and reached HR  176 bpm, which is  112 % of maximum age-predicted HR.  Stress test terminated due to SOB and fatigue.  Exercise capacity was below average for age. HR Response to Exercise: Appropriate. BP Response to Exercise: Resting hypertension- exaggerated response. Chest Pain: non-limiting. Arrhythmias: Occasional PVC and PACs. ST Changes: With peak exercise there was no ST-T changes of ischemia.  Overall Impression: Normal stress test.   Recent labs: 02/28/2020: Glucose 93, BUN/Cr 9/0.7. EGFR >60. Na/K 139/3.4.  H/H 11.5/37.2. MCV 86.7. Platelets 244 Trop HS 3  2018: Chol 181, TG 78, HDL 63, LDL 102   Review of Systems  Cardiovascular:  Positive for chest pain. Negative for dyspnea on exertion, leg swelling, palpitations and syncope.         Vitals:  05/17/22 1055  BP: 91/66  Pulse: 82  Resp: 16  Temp: 98 F (36.7 C)  SpO2: 98%    Body mass index is 27.62 kg/m. Filed Weights   05/17/22 1055  Weight: 166 lb (75.3 kg)     Objective:   Physical Exam Vitals and nursing note reviewed.  Constitutional:      General: She is not in acute distress. Neck:     Vascular: No JVD.  Cardiovascular:     Rate and Rhythm: Normal rate and regular rhythm.     Pulses: Intact distal pulses.     Heart sounds: Normal heart sounds. No murmur heard. Pulmonary:     Effort: Pulmonary effort is normal.     Breath sounds: Normal breath sounds. No wheezing or rales.   Musculoskeletal:        General: Tenderness (Chest wall tenderness) present.     Right lower leg: No edema.     Left lower leg: No edema.           Assessment & Recommendations:   71 year old African-American female with controlled hypertension, atypical chest pain.  Chest pain: Workup in the past has been negative. Reportedly underwent stress testing at [facility], results not available to me. I will obtain the results, and will not repeat testing at this tine. Prescribed nitro for prn use.  F/u in 4 weeks.  Nigel Mormon, MD [facility] Pager: [phone] Office: [phone]

## 2022-05-17 NOTE — Telephone Encounter (Signed)
Please advise 

## 2022-05-18 NOTE — Telephone Encounter (Signed)
Could we please contact this patient and reschedule her follow-up appointment for after 06/24/2022.  Thanks, Dr. Amalia Hailey

## 2022-05-23 ENCOUNTER — Telehealth: Payer: Self-pay | Admitting: Podiatry

## 2022-05-23 NOTE — Telephone Encounter (Signed)
Replied via MyChart message to the patient.  Thanks, Dr. Amalia Hailey

## 2022-05-23 NOTE — Telephone Encounter (Signed)
Pt wants to know if a foot massager or a tens unit will help to stimulate her foot while we are waiting for approval for the bone stimulator. Also wants to know if anyone reached out to her insurance company to see if this will be covered.  Please advise.

## 2022-05-23 NOTE — Telephone Encounter (Signed)
Pt wants to know if a foot massager or a tens unit will help to stimulate her foot while we are waiting for approval for the bone stimulator. Also wants to know if anyone reached out to her insurance company to see if this will be covered.   Please advise.

## 2022-06-10 ENCOUNTER — Ambulatory Visit: Payer: Medicare Other | Admitting: Podiatry

## 2022-06-17 ENCOUNTER — Telehealth: Payer: Self-pay | Admitting: Cardiology

## 2022-06-17 NOTE — Telephone Encounter (Signed)
Patient had an appointment for 06/19/22. She canceled because she will be seeing her PCP and says if PCP thinks she should follow up soon with you, then she will call back to make a new appointment.

## 2022-06-19 ENCOUNTER — Ambulatory Visit: Payer: Federal, State, Local not specified - PPO | Admitting: Cardiology

## 2022-06-25 ENCOUNTER — Ambulatory Visit (INDEPENDENT_AMBULATORY_CARE_PROVIDER_SITE_OTHER): Payer: Medicare Other

## 2022-06-25 ENCOUNTER — Ambulatory Visit (INDEPENDENT_AMBULATORY_CARE_PROVIDER_SITE_OTHER): Payer: Medicare Other | Admitting: Podiatry

## 2022-06-25 DIAGNOSIS — S99191K Other physeal fracture of right metatarsal, subsequent encounter for fracture with nonunion: Secondary | ICD-10-CM

## 2022-06-25 NOTE — Progress Notes (Signed)
HPI: 71 y.o. female presenting today for follow-up evaluation of a Jones  fracture that was sustained on Mother's Day, 03/24/2022 when the patient twisted her foot at home.  She went to the urgent care on 03/24/2022 that same day and was diagnosed with a Jones fracture and instructed to follow-up here in the office.    Patient has been wearing the cam boot for the last 6 weeks.  She says that overall there has been some improvement.  She presents for further treatment and evaluation  Past Medical History:  Diagnosis Date   Allergy    Anemia    Colitis    Dyspareunia    Endometriosis    GERD (gastroesophageal reflux disease)    High cholesterol    Hx of abnormal Pap smear 1980's   Hypertension    Osteopenia    Ulcer    Urinary incontinence     Past Surgical History:  Procedure Laterality Date   ABDOMINAL HYSTERECTOMY  09/2008   TVH/BSO--Dr. Quincy Simmonds with TVT   BLADDER SUSPENSION  09/2008   Dr. Quincy Simmonds   CERVIX LESION DESTRUCTION  1980   hx abnormal pap--dysplasia   COLONOSCOPY WITH PROPOFOL Left 10/25/2017   Procedure: COLONOSCOPY WITH PROPOFOL;  Surgeon: Ronnette Juniper, MD;  Location: [facility];  Service: Gastroenterology;  Laterality: Left;    Allergies  Allergen Reactions   Amoxicillin-Pot Clavulanate Nausea And Vomiting and Other (See Comments)   Ciprofloxacin Nausea And Vomiting and Other (See Comments)    Other reaction(s): GI bleeding   Levofloxacin     Other reaction(s): itchy,achey bones   Macrobid [Nitrofurantoin] Itching   Pantoprazole Sodium Other (See Comments)    Other reaction(s): itching, joint pains   Sulfa Antibiotics Other (See Comments)    Unknown Other reaction(s): do not remember     Physical Exam: General: The patient is alert and oriented x3 in no acute distress.  Dermatology: Skin is warm, dry and supple bilateral lower extremities. Negative for open lesions or macerations.  Vascular: Palpable pedal pulses bilaterally. Capillary refill within  normal limits.  Edema noted right foot  Neurological: Light touch and protective threshold grossly intact  Musculoskeletal Exam: No pedal deformities noted.  There continues to be some mild tenderness to light touch to the lateral aspect of the right foot at the base of the fifth metatarsal  Radiographic Exam RT foot 05/06/2022:  There does appear to be evidence of healing especially along the medial portion of the fracture site.  There is increased bone resorption to the lateral portion of the fracture site.  Assessment: 1.  Jones fracture RT foot; closed, nondisplaced, subsequent encounter   Plan of Care:  1. Patient evaluated. X-Rays reviewed and compared to prior x-rays.  There continues to be nonunion/delayed healing of the fracture site 2.  Authorization was resubmitted for bone stimulator. 3.  Continue minimal weightbearing in the postsurgical shoe 4.  Return to clinic 6 weeks  *Patient leaves to visit [place], [place] 06/11/2022     Edrick Kins, DPM [facility]  Dr. Edrick Kins, DPM    [address], [place] [address]                Office [phone]  Fax [phone])  [phone]   

## 2022-06-25 NOTE — Progress Notes (Signed)
GYNECOLOGY  VISIT   HPI: 71 y.o.   Divorced  [place] American  female   [phone] with No LMP recorded. Patient has had a hysterectomy.   here for left breast pain x 1 month.   Uncertain if there is a mass. Having itching on both breasts.   Also has some moles in her skin.   Always had inverted nipples.   Not using any hormone treatment.   She has a history of left chest wall pain following an accident.  She has a hx of neuralgia.    Dog does step on her chest/breast.   Broke a metatarsal in her right foot.  Just started driving again.  GYNECOLOGIC HISTORY: No LMP recorded. Patient has had a hysterectomy. Contraception:  Hyst Menopausal hormone therapy:  none Last mammogram:  11-19-21 Neg/BiRads1 Last pap smear:  Years ago prior to hysterectomy         OB History     Gravida  3   Para  3   Term  3   Preterm      AB      Living  3      SAB      IAB      Ectopic      Multiple      Live Births                 Patient Active Problem List   Diagnosis Date Noted   Gastroesophageal reflux disease 06/06/2020   Atypical chest pain 04/21/2020   Closed nondisplaced fracture of proximal phalanx of lesser toe of left foot 11/19/2017   GI bleed 10/23/2017   Hypokalemia 10/23/2017   Nausea vomiting and diarrhea 10/23/2017   Hematochezia 10/23/2017   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 Quincy Simmonds with TVT   BLADDER SUSPENSION   09/2008   Dr. Quincy Simmonds   CERVIX LESION DESTRUCTION  1980   hx abnormal pap--dysplasia   COLONOSCOPY WITH PROPOFOL Left 10/25/2017   Procedure: COLONOSCOPY WITH PROPOFOL;  Surgeon: Ronnette Juniper, MD;  Location: [facility];  Service: Gastroenterology;  Laterality: Left;    Current Outpatient Medications  Medication Sig Dispense Refill   Ascorbic Acid (VITAMIN C) 1000 MG tablet Take 1,000 mg by mouth daily.     atorvastatin (LIPITOR) 10 MG tablet Take 10 mg by mouth daily.     cetirizine (ZYRTEC) 10 MG tablet 1 tablet     Cholecalciferol (VITAMIN D) 2000 UNITS CAPS Take 6,000 Units by mouth daily.     docusate sodium (COLACE) 100 MG capsule 1 capsule as needed     fluticasone (FLONASE) 50 MCG/ACT nasal spray Place 1 spray into both nostrils daily as needed for allergies or rhinitis.      lisinopril-hydrochlorothiazide (ZESTORETIC) 10-12.5 MG tablet 1 tablet     Magnesium 250 MG TABS 1 tablet with a meal  Multiple Vitamins-Minerals (MULTIVITAMIN ADULT PO) Take 1 tablet by mouth daily.     nitroGLYCERIN (NITROSTAT) 0.4 MG SL tablet Place 1 tablet (0.4 mg total) under the tongue every 5 (five) minutes as needed for chest pain. 30 tablet 3   Omega-3 Fatty Acids (FISH OIL) 1000 MG CPDR Take by mouth.     No current facility-administered medications for this visit.     ALLERGIES: Amoxicillin-pot clavulanate, Ciprofloxacin, Levofloxacin, Macrobid [nitrofurantoin], Pantoprazole sodium, and Sulfa antibiotics  Family History  Problem Relation Age of Onset   Diabetes Mother    Hypertension Mother    Thyroid disease Sister    Seizures Brother    Diabetes Brother    Hypertension Brother    Diabetes Brother    Arthritis Other    Diabetes Other    Breast cancer Cousin     Social History   Socioeconomic History   Marital status: Divorced    Spouse name: Not on file   Number of children: Not on file   Years of education: college   Highest education level: Not on file  Occupational  History   Not on file  Tobacco Use   Smoking status: Never   Smokeless tobacco: Never  Vaping Use   Vaping Use: Never used  Substance and Sexual Activity   Alcohol use: Yes    Comment: rare   Drug use: No   Sexual activity: Not Currently    Birth control/protection: Surgical    Comment: TAH/BSO  Other Topics Concern   Not on file  Social History Narrative   Not on file   Social Determinants of Health   Financial Resource Strain: Not on file  Food Insecurity: Not on file  Transportation Needs: Not on file  Physical Activity: Not on file  Stress: Not on file  Social Connections: Not on file  Intimate Partner Violence: Not on file    Review of Systems  Genitourinary:        Left breast pain x 1 month  All other systems reviewed and are negative.   PHYSICAL EXAMINATION:    BP 110/70   Ht 5' 5.5" (1.664 m)   Wt 171 lb (77.6 kg)   BMI 28.02 kg/m     General appearance: alert, cooperative and appears stated age  Breasts: right - normal appearance, no masses or tenderness, No nipple retraction or dimpling, No nipple discharge or bleeding, No axillary or supraclavicular adenopathy Left -  normal appearance, no masses, generalized tenderness, No nipple retraction or dimpling, No nipple discharge or bleeding, No axillary or supraclavicular adenopathy  Chaperone was present for exam:  Estill Bamberg, CMA.  ASSESSMENT  Left breast tenderness.  Hx left chest wall trauma and neuralgia.   PLAN  Dx left mammogram and left breast US. Ibuprofen 600 mg every 6 hours.  She will consider a new bra.  She will try ice or heat.  Fu prn.    An After Visit Summary was printed and given to the patient.  26 min  total time was spent for this patient encounter, including preparation, face-to-face counseling with the patient, coordination of care, and documentation of the encounter.

## 2022-06-26 ENCOUNTER — Ambulatory Visit: Payer: Medicare Other | Admitting: Podiatry

## 2022-06-27 ENCOUNTER — Encounter: Payer: Self-pay | Admitting: Obstetrics and Gynecology

## 2022-06-27 ENCOUNTER — Ambulatory Visit (INDEPENDENT_AMBULATORY_CARE_PROVIDER_SITE_OTHER): Payer: Medicare Other | Admitting: Obstetrics and Gynecology

## 2022-06-27 ENCOUNTER — Telehealth: Payer: Self-pay | Admitting: Obstetrics and Gynecology

## 2022-06-27 VITALS — BP 110/70 | Ht 65.5 in | Wt 171.0 lb

## 2022-06-27 DIAGNOSIS — N644 Mastodynia: Secondary | ICD-10-CM

## 2022-06-27 MED ORDER — IBUPROFEN 600 MG PO TABS: 600.0000 mg | ORAL_TABLET | Freq: Four times a day (QID) | ORAL | 1 refills | Status: DC | PRN

## 2022-06-27 NOTE — Telephone Encounter (Signed)
Please schedule a dx left mammogram and and possible [facility].   My patient has diffuse left breast pain.   I do not feel a mass.

## 2022-06-27 NOTE — Patient Instructions (Signed)
Breast Tenderness Breast tenderness is a common problem for women of all ages, but may also occur in men. Breast tenderness has many possible causes, including hormone changes, infections, taking certain medicines, and caffeine intake. In women, the pain usually comes and goes with the menstrual cycle, but it can also be constant. Breast tenderness may range from mild discomfort to severe pain. You may have tests, such as a mammogram or an ultrasound, to check for any unusual findings. Having breast tenderness usually does not mean that you have breast cancer. Follow these instructions at home: Managing pain and discomfort  If directed, put ice on the painful area. To do this: Put ice in a plastic bag. Place a towel between your skin and the bag. Leave the ice on for 20 minutes, 2-3 times a day. If your skin turns bright red, remove the ice right away to prevent skin damage. The risk of skin damage is higher if you cannot feel pain, heat, or cold. Wear a supportive bra or chest support: During exercise. While sleeping, if your breasts are very tender. Medicines Take over-the-counter and prescription medicines only as told by your health care provider. If the cause of your pain is an infection, you may be prescribed an antibiotic medicine. If you were prescribed antibiotics, take them as told by your health care provider. Do not stop using the antibiotic even if you start to feel better. Eating and drinking Decrease the amount of caffeine in your diet. Instead, drink more water and choose caffeine-free drinks. Your health care provider may recommend that you lessen the amount of fat in your diet. You can do this by: Limiting fried foods. Cooking foods using methods such as baking, boiling, grilling, and broiling. General instructions  Keep a log of the days and times when your breasts are most tender. Ask your health care provider how to do breast exams at home. This will help you notice if  you have an unusual growth or lump. Keep all follow-up visits. Contact a health care provider if: Any part of your breast is hard, red, and hot to the touch. This may be a sign of infection. You are a woman and have a new or painful lump in your breast that remains after your menstrual period ends. You are not breastfeeding and you have fluid, especially blood or pus, coming out of your nipples. You have a fever. Your pain does not improve or it gets worse. Your pain is interfering with your daily activities. Summary Breast tenderness may range from mild discomfort to severe pain. Breast tenderness has many possible causes, including hormone changes, infections, taking certain medicines, and caffeine intake. It can be treated with ice, wearing a supportive bra or chest support, and medicines. Make changes to your diet as told by your health care provider. This information is not intended to replace advice given to you by your health care provider. Make sure you discuss any questions you have with your health care provider. Document Revised: 01/09/2022 Document Reviewed: 01/09/2022 Elsevier Patient Education  2023 Elsevier Inc.  

## 2022-06-28 NOTE — Telephone Encounter (Signed)
Patient scheduled on 07/11/22 @ 11:10 am at the breast center. Patient informed.

## 2022-06-28 NOTE — Telephone Encounter (Signed)
Encounter reviewed and closed.  

## 2022-07-04 ENCOUNTER — Telehealth: Payer: Self-pay | Admitting: *Deleted

## 2022-07-04 NOTE — Telephone Encounter (Signed)
Seth Bake w/ [employer] is calling to ask if xray's were completed for patient on 06/25/22, not mentioned in office notes. Spoke with her giving her information needed that yes xray's were taken on that date, verbalized understanding.

## 2022-07-11 ENCOUNTER — Ambulatory Visit
Admission: RE | Admit: 2022-07-11 | Discharge: 2022-07-11 | Disposition: A | Discharge status: Home / Self Care | Payer: Medicare Other | Source: Ambulatory Visit | Attending: Obstetrics and Gynecology | Admitting: Obstetrics and Gynecology

## 2022-07-11 ENCOUNTER — Ambulatory Visit: Admission: RE | Admit: 2022-07-11 | Payer: Medicare Other | Source: Ambulatory Visit

## 2022-07-11 DIAGNOSIS — N644 Mastodynia: Secondary | ICD-10-CM

## 2022-08-07 ENCOUNTER — Ambulatory Visit (INDEPENDENT_AMBULATORY_CARE_PROVIDER_SITE_OTHER): Payer: Medicare Other

## 2022-08-07 ENCOUNTER — Ambulatory Visit (INDEPENDENT_AMBULATORY_CARE_PROVIDER_SITE_OTHER): Payer: Medicare Other | Admitting: Podiatry

## 2022-08-07 DIAGNOSIS — S99191K Other physeal fracture of right metatarsal, subsequent encounter for fracture with nonunion: Secondary | ICD-10-CM

## 2022-08-07 NOTE — Progress Notes (Signed)
   HPI: 71 y.o. female presenting today for follow-up evaluation of a Jones  fracture that was sustained on Mother's Day, 03/24/2022 when the patient twisted her foot at home.  Patient states that overall she does have some improvement.  She continues to have some very mild tenderness however.  She has been using the bone stimulator now for about 4 weeks.  Past Medical History:  Diagnosis Date   Allergy    Anemia    Colitis    Dyspareunia    Endometriosis    GERD (gastroesophageal reflux disease)    High cholesterol    Hx of abnormal Pap smear 1980's   Hypertension    Osteopenia    Ulcer    Urinary incontinence     Past Surgical History:  Procedure Laterality Date   ABDOMINAL HYSTERECTOMY  09/2008   TVH/BSO--Dr. Quincy Simmonds with TVT   BLADDER SUSPENSION  09/2008   Dr. Quincy Simmonds   CERVIX LESION DESTRUCTION  1980   hx abnormal pap--dysplasia   COLONOSCOPY WITH PROPOFOL Left 10/25/2017   Procedure: COLONOSCOPY WITH PROPOFOL;  Surgeon: Ronnette Juniper, MD;  Location: [facility];  Service: Gastroenterology;  Laterality: Left;    Allergies  Allergen Reactions   Amoxicillin-Pot Clavulanate Nausea And Vomiting and Other (See Comments)   Ciprofloxacin Nausea And Vomiting and Other (See Comments)    Other reaction(s): GI bleeding   Levofloxacin Other (See Comments)    Other reaction(s): itchy,achey bones   Macrobid [Nitrofurantoin] Itching   Pantoprazole Sodium Other (See Comments)    Other reaction(s): itching, joint pains   Sulfa Antibiotics Other (See Comments)    Unknown Other reaction(s): do not remember     Physical Exam: General: The patient is alert and oriented x3 in no acute distress.  Dermatology: Skin is warm, dry and supple bilateral lower extremities. Negative for open lesions or macerations.  Vascular: Palpable pedal pulses bilaterally. Capillary refill within normal limits.  Edema noted right foot  Neurological: Light touch and protective threshold grossly  intact  Musculoskeletal Exam: No pedal deformities noted.  There continues to be some mild tenderness to light touch to the lateral aspect of the right foot at the base of the fifth metatarsal  Radiographic Exam RT foot 05/06/2022:  There continues to be evidence of healing especially along the medial portion of the fracture site.  It appears that the fracture site is healing nicely  Assessment: 1.  Jones fracture RT foot; closed, nondisplaced, subsequent encounter   Plan of Care:  1. Patient evaluated. X-Rays reviewed and compared to prior x-rays.  Today there is good demonstration of bone healing and osseous restructuring within the fracture site 2.  Continue the Biomet bone stimulator as instructed 3.  Continue wearing good supportive shoes and sneakers 4.  Return to clinic in 6 weeks for follow-up x-ray  *Patient leaves to visit [place], [place] for Thanksgiving     Edrick Kins, DPM [facility]  Dr. Edrick Kins, DPM    [address] [place],  [address]                Office [phone]  Fax [phone]

## 2022-08-16 ENCOUNTER — Emergency Department (HOSPITAL_BASED_OUTPATIENT_CLINIC_OR_DEPARTMENT_OTHER)
Admission: EM | Admit: 2022-08-16 | Discharge: 2022-08-16 | Disposition: A | Discharge status: Home / Self Care | Payer: Medicare Other | Attending: Emergency Medicine | Admitting: Emergency Medicine

## 2022-08-16 ENCOUNTER — Encounter (HOSPITAL_BASED_OUTPATIENT_CLINIC_OR_DEPARTMENT_OTHER): Payer: Self-pay | Admitting: Emergency Medicine

## 2022-08-16 ENCOUNTER — Other Ambulatory Visit: Payer: Self-pay

## 2022-08-16 ENCOUNTER — Emergency Department (HOSPITAL_BASED_OUTPATIENT_CLINIC_OR_DEPARTMENT_OTHER): Payer: Medicare Other

## 2022-08-16 DIAGNOSIS — Z79899 Other long term (current) drug therapy: Secondary | ICD-10-CM | POA: Diagnosis not present

## 2022-08-16 DIAGNOSIS — R0789 Other chest pain: Secondary | ICD-10-CM | POA: Diagnosis present

## 2022-08-16 DIAGNOSIS — I10 Essential (primary) hypertension: Secondary | ICD-10-CM | POA: Insufficient documentation

## 2022-08-16 DIAGNOSIS — R079 Chest pain, unspecified: Secondary | ICD-10-CM

## 2022-08-16 LAB — CBC WITH DIFFERENTIAL/PLATELET
Abs Immature Granulocytes: 0.01 10*3/uL (ref 0.00–0.07)
Basophils Absolute: 0 10*3/uL (ref 0.0–0.1)
Basophils Relative: 1 %
Eosinophils Absolute: 0.2 10*3/uL (ref 0.0–0.5)
Eosinophils Relative: 4 %
HCT: 34.8 % — ABNORMAL LOW (ref 36.0–46.0)
Hemoglobin: 10.7 g/dL — ABNORMAL LOW (ref 12.0–15.0)
Immature Granulocytes: 0 %
Lymphocytes Relative: 57 %
Lymphs Abs: 3 10*3/uL (ref 0.7–4.0)
MCH: 25.8 pg — ABNORMAL LOW (ref 26.0–34.0)
MCHC: 30.7 g/dL (ref 30.0–36.0)
MCV: 84.1 fL (ref 80.0–100.0)
Monocytes Absolute: 0.3 10*3/uL (ref 0.1–1.0)
Monocytes Relative: 7 %
Neutro Abs: 1.6 10*3/uL — ABNORMAL LOW (ref 1.7–7.7)
Neutrophils Relative %: 31 %
Platelets: 285 10*3/uL (ref 150–400)
RBC: 4.14 MIL/uL (ref 3.87–5.11)
RDW: 15.1 % (ref 11.5–15.5)
WBC: 5.2 10*3/uL (ref 4.0–10.5)
nRBC: 0 % (ref 0.0–0.2)

## 2022-08-16 LAB — BASIC METABOLIC PANEL
Anion gap: 5 (ref 5–15)
BUN: 10 mg/dL (ref 8–23)
CO2: 29 mmol/L (ref 22–32)
Calcium: 8.8 mg/dL — ABNORMAL LOW (ref 8.9–10.3)
Chloride: 106 mmol/L (ref 98–111)
Creatinine, Ser: 0.67 mg/dL (ref 0.44–1.00)
GFR, Estimated: >60 mL/min (ref >60)
Glucose, Bld: 142 mg/dL — ABNORMAL HIGH (ref 70–99)
Potassium: 3.5 mmol/L (ref 3.5–5.1)
Sodium: 140 mmol/L (ref 135–145)

## 2022-08-16 LAB — CBG MONITORING, ED: Glucose-Capillary: 150 mg/dL — ABNORMAL HIGH (ref 70–99)

## 2022-08-16 LAB — TROPONIN I (HIGH SENSITIVITY): Troponin I (High Sensitivity): 4 ng/L (ref <18)

## 2022-08-16 MED ORDER — ASPIRIN 81 MG PO CHEW
324.0000 mg | CHEWABLE_TABLET | Freq: Once | ORAL | Status: AC
  Administered 2022-08-16: 324 mg via ORAL
  Filled 2022-08-16: qty 4

## 2022-08-16 MED ORDER — DICLOFENAC SODIUM 1 % EX GEL: 4.0000 g | Freq: Four times a day (QID) | CUTANEOUS | 0 refills | Status: DC

## 2022-08-16 NOTE — ED Triage Notes (Signed)
Back pain started  yeasterday worse this am and cp started this am , some sob no nausea

## 2022-08-16 NOTE — Discharge Instructions (Signed)
Use the gel as prescribed. Take tylenol '1000mg'$ (2 extra strength) four times a day.   Your work-up here did not suggest that you had had an acute heart attack.  More likely you have a muscular strain.  Please follow-up with your family doctor in the office.
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
## 2022-09-18 ENCOUNTER — Ambulatory Visit: Payer: Medicare Other | Admitting: Podiatry

## 2022-09-23 ENCOUNTER — Other Ambulatory Visit (HOSPITAL_COMMUNITY): Payer: Self-pay | Admitting: Family Medicine

## 2022-09-23 DIAGNOSIS — R0602 Shortness of breath: Secondary | ICD-10-CM

## 2022-09-24 ENCOUNTER — Ambulatory Visit (INDEPENDENT_AMBULATORY_CARE_PROVIDER_SITE_OTHER): Payer: Medicare Other

## 2022-09-24 ENCOUNTER — Ambulatory Visit (INDEPENDENT_AMBULATORY_CARE_PROVIDER_SITE_OTHER): Payer: Medicare Other | Admitting: Podiatry

## 2022-09-24 DIAGNOSIS — S99191K Other physeal fracture of right metatarsal, subsequent encounter for fracture with nonunion: Secondary | ICD-10-CM

## 2022-09-24 NOTE — Progress Notes (Signed)
Chief Complaint  Patient presents with   Follow-up    Patient is here for follow-up on right foot.    HPI: 71 y.o. female presenting today for follow-up evaluation of a Jones  fracture that was sustained on Mother's Day, 03/24/2022 when the patient twisted her foot at home.  Patient continues to improve however she states that she continues to have some slight tenderness to the area.  She continues to use the bone stimulator as instructed.  Past Medical History:  Diagnosis Date   Allergy    Anemia    Colitis    Dyspareunia    Endometriosis    GERD (gastroesophageal reflux disease)    High cholesterol    Hx of abnormal Pap smear 1980's   Hypertension    Osteopenia    Ulcer    Urinary incontinence     Past Surgical History:  Procedure Laterality Date   ABDOMINAL HYSTERECTOMY  09/2008   TVH/BSO--Dr. Quincy Simmonds with TVT   BLADDER SUSPENSION  09/2008   Dr. Quincy Simmonds   CERVIX LESION DESTRUCTION  1980   hx abnormal pap--dysplasia   COLONOSCOPY WITH PROPOFOL Left 10/25/2017   Procedure: COLONOSCOPY WITH PROPOFOL;  Surgeon: Ronnette Juniper, MD;  Location: [facility];  Service: Gastroenterology;  Laterality: Left;    Allergies  Allergen Reactions   Amoxicillin-Pot Clavulanate Nausea And Vomiting and Other (See Comments)   Ciprofloxacin Nausea And Vomiting and Other (See Comments)    Other reaction(s): GI bleeding   Levofloxacin Other (See Comments)    Other reaction(s): itchy,achey bones   Macrobid [Nitrofurantoin] Itching   Pantoprazole Sodium Other (See Comments)    Other reaction(s): itching, joint pains   Sulfa Antibiotics Other (See Comments)    Unknown Other reaction(s): do not remember     Physical Exam: General: The patient is alert and oriented x3 in no acute distress.  Dermatology: Skin is warm, dry and supple bilateral lower extremities. Negative for open lesions or macerations.  Vascular: Palpable pedal pulses bilaterally. Capillary refill within normal limits.   Edema noted right foot  Neurological: Light touch and protective threshold grossly intact  Musculoskeletal Exam: No pedal deformities noted.  Minimal tenderness to light touch to the lateral aspect of the right foot at the base of the fifth metatarsal  Radiographic Exam RT foot 09/24/2022:  There continues to be evidence of healing especially along the medial portion of the fracture site.  It appears that the fracture site is healing nicely  Assessment: 1.  Jones fracture RT foot; closed, nondisplaced, subsequent encounter   Plan of Care:  1. Patient evaluated. X-Rays reviewed and compared to prior x-rays.  There continues to be good demonstration of bone healing and osseous restructuring within the fracture site 2.  Continue the Biomet bone stimulator as instructed 3.  Continue wearing good supportive shoes and sneakers 4.  Patient would like to return to clinic in 2 months for follow-up x-ray  *Patient leaves to visit [place], [place] for Thanksgiving for 3 weeks     Edrick Kins, DPM [facility]  Dr. Edrick Kins, DPM    [address], [place] [address]                Office [phone]  Fax [phone]

## 2022-09-26 ENCOUNTER — Encounter (HOSPITAL_COMMUNITY): Payer: Self-pay | Admitting: Emergency Medicine

## 2022-09-27 ENCOUNTER — Encounter (HOSPITAL_COMMUNITY): Payer: Self-pay | Admitting: Emergency Medicine

## 2022-10-16 ENCOUNTER — Other Ambulatory Visit (HOSPITAL_COMMUNITY): Payer: Self-pay | Admitting: *Deleted

## 2022-10-16 ENCOUNTER — Encounter (HOSPITAL_COMMUNITY): Payer: Self-pay

## 2022-10-16 MED ORDER — METOPROLOL TARTRATE 50 MG PO TABS: ORAL_TABLET | ORAL | 0 refills | Status: DC

## 2022-10-17 ENCOUNTER — Telehealth (HOSPITAL_COMMUNITY): Payer: Self-pay | Admitting: Emergency Medicine

## 2022-10-17 NOTE — Telephone Encounter (Signed)
Attempted to call patient regarding upcoming cardiac CT appointment. °Left message on voicemail with name and callback number °Kari Montero RN Navigator Cardiac Imaging °[facility] °[phone] Office °[phone] Cell ° °

## 2022-10-18 ENCOUNTER — Ambulatory Visit (HOSPITAL_COMMUNITY)
Admission: RE | Admit: 2022-10-18 | Discharge: 2022-10-18 | Disposition: A | Discharge status: Home / Self Care | Payer: Medicare Other | Source: Ambulatory Visit | Attending: Family Medicine | Admitting: Family Medicine

## 2022-10-18 DIAGNOSIS — I2510 Atherosclerotic heart disease of native coronary artery without angina pectoris: Secondary | ICD-10-CM | POA: Insufficient documentation

## 2022-10-18 DIAGNOSIS — R0602 Shortness of breath: Secondary | ICD-10-CM | POA: Diagnosis present

## 2022-10-18 MED ORDER — METOPROLOL TARTRATE 5 MG/5ML IV SOLN
10.0000 mg | Freq: Once | INTRAVENOUS | Status: AC
  Administered 2022-10-18: 10 mg via INTRAVENOUS

## 2022-10-18 MED ORDER — NITROGLYCERIN 0.4 MG SL SUBL
SUBLINGUAL_TABLET | SUBLINGUAL | Status: AC
  Filled 2022-10-18: qty 2

## 2022-10-18 MED ORDER — NITROGLYCERIN 0.4 MG SL SUBL
0.8000 mg | SUBLINGUAL_TABLET | Freq: Once | SUBLINGUAL | Status: AC
  Administered 2022-10-18: 0.8 mg via SUBLINGUAL

## 2022-10-18 MED ORDER — IOHEXOL 350 MG/ML SOLN
100.0000 mL | Freq: Once | INTRAVENOUS | Status: AC | PRN
  Administered 2022-10-18: 100 mL via INTRAVENOUS

## 2022-10-18 MED ORDER — METOPROLOL TARTRATE 5 MG/5ML IV SOLN
INTRAVENOUS | Status: AC
  Filled 2022-10-18: qty 10

## 2022-11-15 ENCOUNTER — Ambulatory Visit (INDEPENDENT_AMBULATORY_CARE_PROVIDER_SITE_OTHER): Payer: Medicare Other

## 2022-11-15 ENCOUNTER — Ambulatory Visit (INDEPENDENT_AMBULATORY_CARE_PROVIDER_SITE_OTHER): Payer: Medicare Other | Admitting: Podiatry

## 2022-11-15 DIAGNOSIS — S92301A Fracture of unspecified metatarsal bone(s), right foot, initial encounter for closed fracture: Secondary | ICD-10-CM | POA: Diagnosis not present

## 2022-11-15 NOTE — Progress Notes (Unsigned)
   Chief Complaint  Patient presents with   Follow-up    Follow up on the right foot     HPI: 72 y.o. female presenting today for follow-up evaluation of a Jones  fracture that was sustained on Mother's Day, 03/24/2022 when the patient twisted her foot at home.  Patient states that she feels significantly better.  She has been using the bone stimulator as instructed.  No new complaints at this time  Past Medical History:  Diagnosis Date   Allergy    Anemia    Colitis    Dyspareunia    Endometriosis    GERD (gastroesophageal reflux disease)    High cholesterol    Hx of abnormal Pap smear 1980's   Hypertension    Osteopenia    Ulcer    Urinary incontinence     Past Surgical History:  Procedure Laterality Date   ABDOMINAL HYSTERECTOMY  09/2008   TVH/BSO--Dr. Quincy Simmonds with TVT   BLADDER SUSPENSION  09/2008   Dr. Quincy Simmonds   CERVIX LESION DESTRUCTION  1980   hx abnormal pap--dysplasia   COLONOSCOPY WITH PROPOFOL Left 10/25/2017   Procedure: COLONOSCOPY WITH PROPOFOL;  Surgeon: Ronnette Juniper, MD;  Location: [facility];  Service: Gastroenterology;  Laterality: Left;    Allergies  Allergen Reactions   Amoxicillin-Pot Clavulanate Nausea And Vomiting and Other (See Comments)   Ciprofloxacin Nausea And Vomiting and Other (See Comments)    Other reaction(s): GI bleeding   Levofloxacin Other (See Comments)    Other reaction(s): itchy,achey bones   Levonorgestrel-Ethinyl Estrad Other (See Comments)   Macrobid [Nitrofurantoin] Itching   Pantoprazole Sodium Other (See Comments)    Other reaction(s): itching, joint pains   Sulfa Antibiotics Other (See Comments)    Unknown Other reaction(s): do not remember     Physical Exam: General: The patient is alert and oriented x3 in no acute distress.  Dermatology: Skin is warm, dry and supple bilateral lower extremities. Negative for open lesions or macerations.  Vascular: Palpable pedal pulses bilaterally. Capillary refill within normal  limits.  Edema noted right foot  Neurological: Light touch and protective threshold grossly intact  Musculoskeletal Exam: No pedal deformities noted.  Minimal tenderness to light touch to the lateral aspect of the right foot at the base of the fifth metatarsal  Radiographic Exam RT foot 11/19/2022:  There continues to be evidence of healing especially along the medial portion of the fracture site.  It appears that the fracture site is healing nicely  Assessment: 1.  Jones fracture RT foot; closed, nondisplaced, subsequent encounter   Plan of Care:  1. Patient evaluated. X-Rays reviewed and compared to prior x-rays.  There continues to be good demonstration of bone healing and osseous restructuring within the fracture site 2.  Continue the Biomet bone stimulator as instructed 3.  Continue wearing good supportive shoes and sneakers 4.  Patient would like to return to clinic in 2 months for follow-up x-ray     Edrick Kins, DPM [facility]  Dr. Edrick Kins, DPM    [address], [place] [address]                Office [phone]  Fax [phone]

## 2023-01-02 ENCOUNTER — Other Ambulatory Visit: Payer: Self-pay

## 2023-01-02 ENCOUNTER — Encounter (HOSPITAL_BASED_OUTPATIENT_CLINIC_OR_DEPARTMENT_OTHER): Payer: Self-pay | Admitting: Emergency Medicine

## 2023-01-02 ENCOUNTER — Emergency Department (HOSPITAL_BASED_OUTPATIENT_CLINIC_OR_DEPARTMENT_OTHER)
Admission: EM | Admit: 2023-01-02 | Discharge: 2023-01-02 | Disposition: A | Discharge status: Home / Self Care | Payer: Medicare Other | Attending: Emergency Medicine | Admitting: Emergency Medicine

## 2023-01-02 ENCOUNTER — Emergency Department (HOSPITAL_BASED_OUTPATIENT_CLINIC_OR_DEPARTMENT_OTHER): Payer: Medicare Other

## 2023-01-02 DIAGNOSIS — R1032 Left lower quadrant pain: Secondary | ICD-10-CM | POA: Insufficient documentation

## 2023-01-02 DIAGNOSIS — I10 Essential (primary) hypertension: Secondary | ICD-10-CM | POA: Diagnosis not present

## 2023-01-02 DIAGNOSIS — R110 Nausea: Secondary | ICD-10-CM | POA: Diagnosis not present

## 2023-01-02 DIAGNOSIS — R109 Unspecified abdominal pain: Secondary | ICD-10-CM | POA: Diagnosis present

## 2023-01-02 DIAGNOSIS — Z79899 Other long term (current) drug therapy: Secondary | ICD-10-CM | POA: Diagnosis not present

## 2023-01-02 LAB — COMPREHENSIVE METABOLIC PANEL
ALT: 13 U/L (ref 0–44)
AST: 18 U/L (ref 15–41)
Albumin: 3.6 g/dL (ref 3.5–5.0)
Alkaline Phosphatase: 79 U/L (ref 38–126)
Anion gap: 4 — ABNORMAL LOW (ref 5–15)
BUN: 13 mg/dL (ref 8–23)
CO2: 28 mmol/L (ref 22–32)
Calcium: 8.5 mg/dL — ABNORMAL LOW (ref 8.9–10.3)
Chloride: 105 mmol/L (ref 98–111)
Creatinine, Ser: 0.87 mg/dL (ref 0.44–1.00)
GFR, Estimated: >60 mL/min (ref >60)
Glucose, Bld: 101 mg/dL — ABNORMAL HIGH (ref 70–99)
Potassium: 3.5 mmol/L (ref 3.5–5.1)
Sodium: 137 mmol/L (ref 135–145)
Total Bilirubin: 0.4 mg/dL (ref 0.3–1.2)
Total Protein: 7.1 g/dL (ref 6.5–8.1)

## 2023-01-02 LAB — URINALYSIS, W/ REFLEX TO CULTURE (INFECTION SUSPECTED)
Bilirubin Urine: NEGATIVE
Glucose, UA: NEGATIVE mg/dL
Hgb urine dipstick: NEGATIVE
Ketones, ur: NEGATIVE mg/dL
Leukocytes,Ua: NEGATIVE
Nitrite: NEGATIVE
Protein, ur: NEGATIVE mg/dL
Specific Gravity, Urine: 1.02 (ref 1.005–1.030)
pH: 5 (ref 5.0–8.0)

## 2023-01-02 LAB — CBC WITH DIFFERENTIAL/PLATELET
Abs Immature Granulocytes: 0.01 10*3/uL (ref 0.00–0.07)
Basophils Absolute: 0.1 10*3/uL (ref 0.0–0.1)
Basophils Relative: 1 %
Eosinophils Absolute: 0.2 10*3/uL (ref 0.0–0.5)
Eosinophils Relative: 4 %
HCT: 34.3 % — ABNORMAL LOW (ref 36.0–46.0)
Hemoglobin: 10.6 g/dL — ABNORMAL LOW (ref 12.0–15.0)
Immature Granulocytes: 0 %
Lymphocytes Relative: 60 %
Lymphs Abs: 3.5 10*3/uL (ref 0.7–4.0)
MCH: 26.2 pg (ref 26.0–34.0)
MCHC: 30.9 g/dL (ref 30.0–36.0)
MCV: 84.9 fL (ref 80.0–100.0)
Monocytes Absolute: 0.5 10*3/uL (ref 0.1–1.0)
Monocytes Relative: 8 %
Neutro Abs: 1.6 10*3/uL — ABNORMAL LOW (ref 1.7–7.7)
Neutrophils Relative %: 27 %
Platelets: 229 10*3/uL (ref 150–400)
RBC: 4.04 MIL/uL (ref 3.87–5.11)
RDW: 15.3 % (ref 11.5–15.5)
WBC: 5.8 10*3/uL (ref 4.0–10.5)
nRBC: 0 % (ref 0.0–0.2)

## 2023-01-02 MED ORDER — IOHEXOL 300 MG/ML  SOLN
100.0000 mL | Freq: Once | INTRAMUSCULAR | Status: AC | PRN
  Administered 2023-01-02: 100 mL via INTRAVENOUS

## 2023-01-02 MED ORDER — FENTANYL CITRATE PF 50 MCG/ML IJ SOSY
50.0000 ug | PREFILLED_SYRINGE | Freq: Once | INTRAMUSCULAR | Status: AC
  Administered 2023-01-02: 50 ug via INTRAVENOUS
  Filled 2023-01-02: qty 1

## 2023-01-02 MED ORDER — CYCLOBENZAPRINE HCL 10 MG PO TABS: 10.0000 mg | ORAL_TABLET | Freq: Two times a day (BID) | ORAL | 0 refills | Status: DC | PRN

## 2023-01-02 NOTE — ED Provider Notes (Signed)
[facility] Provider Note   CSN: [id number] Arrival date & time: 01/02/23  0453     History  Chief Complaint  Patient presents with   Flank Pain    Margaret [name] is a 72 y.o. female.  The history is provided by the patient and medical records.  Flank Pain  Margaret [name] is a 72 y.o. female who presents to the Emergency Department complaining of flank pain.  She presents to the emergency department for evaluation of left-sided flank and lower abdominal pain that started a few days ago.  She describes as a hurting and sore type sensation.  She also feels unwell.  She did take Advil at 3 PM but reports pain is worsening.  No fevers, vomiting.  She did have nausea today.  Pain was initially waxing and waning and now is constant in nature.  No associated weakness, chest pain, shortness of breath.  She did pick up a heavy box last week but no definite injuries.  No dysuria but has experienced some vaginal discomfort and irritation.  Hx/o HTN, reflux     Home Medications Prior to Admission medications   Medication Sig Start Date End Date Taking? Authorizing Provider  Ascorbic Acid (VITAMIN C) 1000 MG tablet Take 1,000 mg by mouth daily.    [provider]  atorvastatin (LIPITOR) 10 MG tablet Take 10 mg by mouth daily.    [provider]  cetirizine (ZYRTEC) 10 MG tablet 1 tablet    [provider]  Cholecalciferol (VITAMIN D) 2000 UNITS CAPS Take 6,000 Units by mouth daily.    [provider]  diclofenac Sodium (VOLTAREN) 1 % GEL Apply 4 g topically 4 (four) times daily. 08/16/22   [name], DO  docusate sodium (COLACE) 100 MG capsule 1 capsule as needed    [provider]  fluticasone (FLONASE) 50 MCG/ACT nasal spray Place 1 spray into both nostrils daily as needed for allergies or rhinitis.     [provider]  ibuprofen (ADVIL) 600 MG tablet Take 1 tablet (600 mg  total) by mouth every 6 (six) hours as needed for mild pain. 06/27/22   Nunzio Cobbs, MD  lisinopril-hydrochlorothiazide (ZESTORETIC) 10-12.5 MG tablet 1 tablet    [provider]  Magnesium 250 MG TABS 1 tablet with a meal    [provider]  metoprolol tartrate (LOPRESSOR) 50 MG tablet Take tablet (87m) TWO hours prior to your cardiac CT scan. 10/16/22   O'Neal,Cassie Freer MD  Multiple Vitamins-Minerals (MULTIVITAMIN ADULT PO) Take 1 tablet by mouth daily.    [provider]  nitroGLYCERIN (NITROSTAT) 0.4 MG SL tablet Place 1 tablet (0.4 mg total) under the tongue every 5 (five) minutes as needed for chest pain. 05/17/22 08/15/22  Patwardhan, MReynold Bowen MD  Omega-3 Fatty Acids (FISH OIL) 1000 MG CPDR Take by mouth.    [provider]      Allergies    Amoxicillin-pot clavulanate, Ciprofloxacin, Levofloxacin, Levonorgestrel-ethinyl estrad, Macrobid [nitrofurantoin], Pantoprazole sodium, and Sulfa antibiotics    Review of Systems   Review of Systems  Genitourinary:  Positive for flank pain.  All other systems reviewed and are negative.   Physical Exam Updated Vital Signs BP 130/75 (BP Location: Right Arm)   Pulse 80   Temp 98.3 F (36.8 C) (Oral)   Resp 16   Ht 5' 6"$  (1.676 m)   Wt 75.3 kg   SpO2 100%  BMI 26.79 kg/m  Physical Exam Vitals and nursing note reviewed.  Constitutional:      Appearance: She is well-developed.  HENT:     Head: Normocephalic and atraumatic.  Cardiovascular:     Rate and Rhythm: Normal rate and regular rhythm.  Pulmonary:     Effort: Pulmonary effort is normal.  Abdominal:     Palpations: Abdomen is soft.     Tenderness: There is no guarding or rebound.     Comments: Mild llq tenderness  Musculoskeletal:        General: No swelling or tenderness.  Skin:    General: Skin is warm and dry.  Neurological:     Mental Status: She is alert and oriented to person, place, and time.     Comments: 5/5  strength in all four extremities.   Psychiatric:        Behavior: Behavior normal.     ED Results / Procedures / Treatments   Labs (all labs ordered are listed, but only abnormal results are displayed) Labs Reviewed - No data to display  EKG None  Radiology No results found.  Procedures Procedures    Medications Ordered in ED Medications - No data to display  ED Course/ Medical Decision Making/ A&P Clinical Course as of 01/02/23 0839  Thu Jan 02, 2023  0702 Assumed care from Dr. Ralene Bathe 73 year old female who presents with LLQ pain and feeling poorly for several days. Lifted heavy box one week ago but didn't think there was an injury. Difficult to characterize pain.  On initial exam had mild left lower quadrant abdominal tenderness to palpation.  Getting CT scan to further evaluate.  Urine without UTI. [RP]  0806 CT scan without acute abnormality.  Discussed with patient and states that her pain is sometimes worse with movement.  Feel that it could be a musculoskeletal strain given her reassuring evaluation here.  Instructed her to take Tylenol and use heating pads for pain.  Also gave her a short course of cyclobenzaprine to take at night since she says that the pain has been keeping her awake.  Will have her follow-up with her primary doctor in several days. [RP]    Clinical Course User Index [RP] Fransico Meadow, MD                             Medical Decision Making Amount and/or Complexity of Data Reviewed Labs: ordered. Radiology: ordered.  Risk Prescription drug management.   Patient here for evaluation of left lower quadrant abdominal pain, feels unwell.  UA is not consistent with UTI.  She does have some left lower quadrant tenderness on examination.  Plan to obtain CT scan to evaluate for diverticulitis.  Care transferred pending CT abdomen pelvis.        Final Clinical Impression(s) / ED Diagnoses Final diagnoses:  None    Rx / DC Orders ED  Discharge Orders     None         Quintella Reichert, MD 01/02/23 [phone]

## 2023-01-02 NOTE — Discharge Instructions (Signed)
You were seen for your abdominal pain in the emergency department.   At home, please take Tylenol and use heating pads for pain.  You may also use the muscle relaxer that we have prescribed you (cyclobenzaprine).  Do not take this medicine before driving or operating heavy machinery since it can make you drowsy.    Check your MyChart online for the results of any tests that had not resulted by the time you left the emergency department.   Follow-up with your primary doctor in 2-3 days regarding your visit.    Return immediately to the emergency department if you experience any of the following: Fevers, vomiting, or any other concerning symptoms.    Thank you for visiting our Emergency Department. It was a pleasure taking care of you today.

## 2023-01-02 NOTE — ED Provider Notes (Signed)
  Physical Exam  BP 130/75 (BP Location: Right Arm)   Pulse 80   Temp 98.3 F (36.8 C) (Oral)   Resp 16   Ht 5' 6"$  (1.676 m)   Wt 75.3 kg   SpO2 100%   BMI 26.79 kg/m   Physical Exam Constitutional:      Appearance: Normal appearance.  HENT:     Head: Normocephalic.     Nose: Nose normal.     Mouth/Throat:     Mouth: Mucous membranes are moist.     Pharynx: Oropharynx is clear.  Abdominal:     General: There is no distension.     Palpations: There is no mass.     Tenderness: There is abdominal tenderness (Left flank and left lower quadrant). There is no guarding.  Neurological:     Mental Status: She is alert.     Procedures  Procedures  ED Course / MDM   Clinical Course as of 01/02/23 0806  Thu Jan 02, 2023  0702 Assumed care from Dr. Ralene Bathe 72 year old female who presents with LLQ pain and feeling poorly for several days. Lifted heavy box one week ago but didn't think there was an injury. Difficult to characterize pain.  On initial exam had mild left lower quadrant abdominal tenderness to palpation.  Getting CT scan to further evaluate.  Urine without UTI. [RP]  0806 CT scan without acute abnormality.  Discussed with patient and states that her pain is sometimes worse with movement.  Feel that it could be a musculoskeletal strain given her reassuring evaluation here.  Instructed her to take Tylenol and use heating pads for pain.  Also gave her a short course of cyclobenzaprine to take at night since she says that the pain has been keeping her awake.  Will have her follow-up with her primary doctor in several days. [RP]    Clinical Course User Index [RP] Fransico Meadow, MD   Medical Decision Making Amount and/or Complexity of Data Reviewed Labs: ordered. Radiology: ordered.  Risk Prescription drug management.     Fransico Meadow, MD 01/02/23 [phone]

## 2023-01-02 NOTE — ED Triage Notes (Signed)
Pt is c/o left flank pain x 2 days  Has some nausea without vomiting  Denies hematuria but states she has slight irritation

## 2023-01-09 ENCOUNTER — Encounter: Payer: Self-pay | Admitting: Radiology

## 2023-01-13 ENCOUNTER — Ambulatory Visit (INDEPENDENT_AMBULATORY_CARE_PROVIDER_SITE_OTHER): Payer: Medicare Other

## 2023-01-13 ENCOUNTER — Ambulatory Visit (INDEPENDENT_AMBULATORY_CARE_PROVIDER_SITE_OTHER): Payer: Medicare Other | Admitting: Podiatry

## 2023-01-13 DIAGNOSIS — S92301A Fracture of unspecified metatarsal bone(s), right foot, initial encounter for closed fracture: Secondary | ICD-10-CM

## 2023-01-13 NOTE — Progress Notes (Signed)
   Chief Complaint  Patient presents with   Fracture    Patient came in today for right foot fracture follow-up, patient still has some pain, rateof pain 3 out of 10, X-rays done today     HPI: 72 y.o. female presenting today for follow-up evaluation of a Jones  fracture that was sustained on Mother's Day, 03/24/2022 when the patient twisted her foot at home.  Patient states that she only has mild intermittent pain on occasion.  Otherwise she selectivity with no restrictions in tennis shoes  Past Medical History:  Diagnosis Date   Allergy    Anemia    Colitis    Dyspareunia    Endometriosis    GERD (gastroesophageal reflux disease)    High cholesterol    Hx of abnormal Pap smear 1980's   Hypertension    Osteopenia    Ulcer    Urinary incontinence     Past Surgical History:  Procedure Laterality Date   ABDOMINAL HYSTERECTOMY  09/2008   TVH/BSO--Dr. Quincy Simmonds with TVT   BLADDER SUSPENSION  09/2008   Dr. Quincy Simmonds   CERVIX LESION DESTRUCTION  1980   hx abnormal pap--dysplasia   COLONOSCOPY WITH PROPOFOL Left 10/25/2017   Procedure: COLONOSCOPY WITH PROPOFOL;  Surgeon: Ronnette Juniper, MD;  Location: [facility];  Service: Gastroenterology;  Laterality: Left;    Allergies  Allergen Reactions   Amoxicillin-Pot Clavulanate Nausea And Vomiting and Other (See Comments)   Ciprofloxacin Nausea And Vomiting and Other (See Comments)    Other reaction(s): GI bleeding   Levofloxacin Other (See Comments)    Other reaction(s): itchy,achey bones   Levonorgestrel-Ethinyl Estrad Other (See Comments)   Macrobid [Nitrofurantoin] Itching   Pantoprazole Sodium Other (See Comments)    Other reaction(s): itching, joint pains   Sulfa Antibiotics Other (See Comments)    Unknown Other reaction(s): do not remember     Physical Exam: General: The patient is alert and oriented x3 in no acute distress.  Dermatology: Skin is warm, dry and supple bilateral lower extremities. Negative for open lesions or  macerations.  Vascular: Palpable pedal pulses bilaterally. Capillary refill within normal limits.  Edema noted right foot  Neurological: Light touch and protective threshold grossly intact  Musculoskeletal Exam: No pedal deformities noted.  Negative for any appreciable tenderness to light touch to the lateral aspect of the right foot at the base of the fifth metatarsal  Radiographic Exam RT foot 01/13/2023:  Well-healed Jones fracture.  No other acute findings noted  Assessment: 1.  Jones fracture RT foot; closed, nondisplaced, subsequent encounter   Plan of Care:  1. Patient evaluated. X-Rays reviewed and compared to prior x-rays.  2.  Patient may resume full activity no restrictions 3.  Discontinue bone stimulator 4.  Return to clinic as needed     Edrick Kins, DPM [facility]  Dr. Edrick Kins, DPM    [address], [place] [address]                Office [phone]  Fax [phone]

## 2023-04-21 ENCOUNTER — Other Ambulatory Visit: Payer: Self-pay | Admitting: Orthopedic Surgery

## 2023-04-21 DIAGNOSIS — M50122 Cervical disc disorder at C5-C6 level with radiculopathy: Secondary | ICD-10-CM

## 2023-04-21 DIAGNOSIS — M50121 Cervical disc disorder at C4-C5 level with radiculopathy: Secondary | ICD-10-CM

## 2023-04-23 ENCOUNTER — Other Ambulatory Visit: Payer: Self-pay

## 2023-04-23 ENCOUNTER — Encounter (HOSPITAL_BASED_OUTPATIENT_CLINIC_OR_DEPARTMENT_OTHER): Payer: Self-pay

## 2023-04-23 ENCOUNTER — Encounter: Payer: Self-pay | Admitting: Orthopedic Surgery

## 2023-04-23 ENCOUNTER — Emergency Department (HOSPITAL_BASED_OUTPATIENT_CLINIC_OR_DEPARTMENT_OTHER): Payer: Medicare Other

## 2023-04-23 ENCOUNTER — Emergency Department (HOSPITAL_BASED_OUTPATIENT_CLINIC_OR_DEPARTMENT_OTHER)
Admission: EM | Admit: 2023-04-23 | Discharge: 2023-04-23 | Disposition: A | Discharge status: Home / Self Care | Payer: Medicare Other | Attending: Emergency Medicine | Admitting: Emergency Medicine

## 2023-04-23 DIAGNOSIS — R079 Chest pain, unspecified: Secondary | ICD-10-CM | POA: Insufficient documentation

## 2023-04-23 DIAGNOSIS — E876 Hypokalemia: Secondary | ICD-10-CM | POA: Diagnosis not present

## 2023-04-23 LAB — CBC
HCT: 36.3 % (ref 36.0–46.0)
Hemoglobin: 11.2 g/dL — ABNORMAL LOW (ref 12.0–15.0)
MCH: 26.4 pg (ref 26.0–34.0)
MCHC: 30.9 g/dL (ref 30.0–36.0)
MCV: 85.6 fL (ref 80.0–100.0)
Platelets: 274 10*3/uL (ref 150–400)
RBC: 4.24 MIL/uL (ref 3.87–5.11)
RDW: 15.7 % — ABNORMAL HIGH (ref 11.5–15.5)
WBC: 4.9 10*3/uL (ref 4.0–10.5)
nRBC: 0 % (ref 0.0–0.2)

## 2023-04-23 LAB — BASIC METABOLIC PANEL
Anion gap: 7 (ref 5–15)
BUN: 14 mg/dL (ref 8–23)
CO2: 27 mmol/L (ref 22–32)
Calcium: 8.9 mg/dL (ref 8.9–10.3)
Chloride: 100 mmol/L (ref 98–111)
Creatinine, Ser: 0.78 mg/dL (ref 0.44–1.00)
GFR, Estimated: >60 mL/min (ref >60)
Glucose, Bld: 114 mg/dL — ABNORMAL HIGH (ref 70–99)
Potassium: 3.4 mmol/L — ABNORMAL LOW (ref 3.5–5.1)
Sodium: 134 mmol/L — ABNORMAL LOW (ref 135–145)

## 2023-04-23 LAB — TROPONIN I (HIGH SENSITIVITY): Troponin I (High Sensitivity): 3 ng/L (ref <18)

## 2023-04-23 MED ORDER — ACETAMINOPHEN 500 MG PO TABS
1000.0000 mg | ORAL_TABLET | Freq: Once | ORAL | Status: AC
  Administered 2023-04-23: 1000 mg via ORAL
  Filled 2023-04-23: qty 2

## 2023-04-23 MED ORDER — LIDOCAINE 5 % EX PTCH
1.0000 | MEDICATED_PATCH | Freq: Once | CUTANEOUS | Status: DC
  Administered 2023-04-23: 1 via TRANSDERMAL
  Filled 2023-04-23: qty 2

## 2023-04-23 NOTE — ED Triage Notes (Signed)
Chest pain that started center of chest this am around 11 am. Did take 2 nitro tablets. Denies any nausea or vomiting. Did have a nose bleed on Sunday. Also having back pain for which she has an MRI scheduled. The back pain is causing numbness and tingling in the left arm.

## 2023-04-23 NOTE — ED Notes (Signed)
D/c paperwork reviewed with pt, including follow up care.  All questions and/or concerns addressed at time of d/c.  No further needs expressed. . Pt verbalized understanding, Ambulatory without assistance to ED exit, NAD.   

## 2023-04-23 NOTE — ED Provider Notes (Signed)
[facility] Provider Note   CSN: [id number] Arrival date & time: 04/23/23  1926     History  Chief Complaint  Patient presents with   Chest Pain    NALIN MULLINEAUX is a 72 y.o. female.  Patient is a 72 year old female with a past medical history of hypertension presenting to the emergency department with chest pain.  Patient reports that around 11 AM this morning she started to develop midsternal chest pain.  She states that it felt like a discomfort and pressure type pain.  She states that she tried to walk her dog but felt generally fatigued and was unable to go as far as usual.  When she got home she tried to take a nitro which mildly improved the pain for a few minutes but then the pain came back.  She states that she has also been dealing with some upper back/lower neck pain with some paresthesias in her right arm.  She states that her back pain felt worse than usual today but she did not have any worsening of her paresthesias or pain in her right arm.  She denies any new numbness or weakness.  She denies any lower extremity swelling but reported that her legs felt heavy.  The history is provided by the patient.  Chest Pain      Home Medications Prior to Admission medications   Medication Sig Start Date End Date Taking? Authorizing Provider  Ascorbic Acid (VITAMIN C) 1000 MG tablet Take 1,000 mg by mouth daily.    [provider]  atorvastatin (LIPITOR) 10 MG tablet Take 10 mg by mouth daily.    [provider]  cetirizine (ZYRTEC) 10 MG tablet 1 tablet    [provider]  Cholecalciferol (VITAMIN D) 2000 UNITS CAPS Take 6,000 Units by mouth daily.    [provider]  cyclobenzaprine (FLEXERIL) 10 MG tablet Take 1 tablet (10 mg total) by mouth 2 (two) times daily as needed for muscle spasms. 01/02/23   Rondel Baton, MD  diclofenac Sodium (VOLTAREN) 1 % GEL Apply 4 g topically 4 (four)  times daily. 08/16/22   Melene Plan, DO  docusate sodium (COLACE) 100 MG capsule 1 capsule as needed    [provider]  fluticasone (FLONASE) 50 MCG/ACT nasal spray Place 1 spray into both nostrils daily as needed for allergies or rhinitis.     [provider]  ibuprofen (ADVIL) 600 MG tablet Take 1 tablet (600 mg total) by mouth every 6 (six) hours as needed for mild pain. 06/27/22   Patton Salles, MD  lisinopril-hydrochlorothiazide (ZESTORETIC) 10-12.5 MG tablet 1 tablet    [provider]  Magnesium 250 MG TABS 1 tablet with a meal    [provider]  metoprolol tartrate (LOPRESSOR) 50 MG tablet Take tablet (50mg ) TWO hours prior to your cardiac CT scan. 10/16/22   O'NealRonnald Ramp, MD  Multiple Vitamins-Minerals (MULTIVITAMIN ADULT PO) Take 1 tablet by mouth daily.    [provider]  nitroGLYCERIN (NITROSTAT) 0.4 MG SL tablet Place 1 tablet (0.4 mg total) under the tongue every 5 (five) minutes as needed for chest pain. 05/17/22 08/15/22  Patwardhan, Anabel Bene, MD  Omega-3 Fatty Acids (FISH OIL) 1000 MG CPDR Take by mouth.    [provider]      Allergies    Amoxicillin-pot clavulanate, Ciprofloxacin, Levofloxacin, Levonorgestrel-ethinyl estrad, Macrobid [nitrofurantoin], Pantoprazole sodium, and Sulfa antibiotics    Review  of Systems   Review of Systems  Cardiovascular:  Positive for chest pain.    Physical Exam Updated Vital Signs BP 136/85   Pulse 69   Temp 98.5 F (36.9 C) (Oral)   Resp 11   Ht 5\' 6"  (1.676 m)   Wt 75.3 kg   SpO2 100%   BMI 26.79 kg/m  Physical Exam Vitals and nursing note reviewed.  Constitutional:      General: She is not in acute distress.    Appearance: She is well-developed.  HENT:     Head: Normocephalic and atraumatic.  Eyes:     Extraocular Movements: Extraocular movements intact.     Pupils: Pupils are equal, round, and reactive to light.  Cardiovascular:     Rate and Rhythm:  Normal rate and regular rhythm.     Pulses:          Radial pulses are 2+ on the right side and 2+ on the left side.       Dorsalis pedis pulses are 2+ on the right side and 2+ on the left side.     Heart sounds: Normal heart sounds.  Pulmonary:     Effort: Pulmonary effort is normal.     Breath sounds: Normal breath sounds.  Chest:     Chest wall: No tenderness.  Abdominal:     Palpations: Abdomen is soft.     Tenderness: There is no abdominal tenderness.  Musculoskeletal:        General: Normal range of motion.     Cervical back: Normal range of motion and neck supple.     Comments: Midline neck or back tenderness, mild trapezius muscle tenderness bilaterally  Skin:    General: Skin is warm and dry.  Neurological:     General: No focal deficit present.     Mental Status: She is alert and oriented to person, place, and time.     Motor: No weakness.  Psychiatric:        Mood and Affect: Mood normal.        Behavior: Behavior normal.     ED Results / Procedures / Treatments   Labs (all labs ordered are listed, but only abnormal results are displayed) Labs Reviewed  BASIC METABOLIC PANEL - Abnormal; Notable for the following components:      Result Value   Sodium 134 (*)    Potassium 3.4 (*)    Glucose, Bld 114 (*)    All other components within normal limits  CBC - Abnormal; Notable for the following components:   Hemoglobin 11.2 (*)    RDW 15.7 (*)    All other components within normal limits  TROPONIN I (HIGH SENSITIVITY)    EKG EKG Interpretation  Date/Time:  Wednesday April 23 2023 19:33:39 EDT Ventricular Rate:  73 PR Interval:  169 QRS Duration: 72 QT Interval:  409 QTC Calculation: 451 R Axis:   62 Text Interpretation: Sinus rhythm No significant change since last tracing Confirmed by Elayne Snare ([id number]) on 04/23/2023 8:28:29 PM  Radiology DG Chest 2 View  Result Date: 04/23/2023 CLINICAL DATA:  Chest pain. EXAM: CHEST - 2 VIEW COMPARISON:   August 16, 2022 FINDINGS: The heart size and mediastinal contours are within normal limits. Both lungs are clear. The visualized skeletal structures are unremarkable. IMPRESSION: No active cardiopulmonary disease. Electronically Signed   By: Aram Candela M.D.   On: 04/23/2023 20:08    Procedures Procedures    Medications Ordered in ED Medications  lidocaine (LIDODERM) 5 % 1-3 patch (has no administration in time range)  acetaminophen (TYLENOL) tablet 1,000 mg (1,000 mg Oral Given 04/23/23 2155)    ED Course/ Medical Decision Making/ A&P                             Medical Decision Making This patient presents to the ED with chief complaint(s) of chest pain with pertinent past medical history of HTN which further complicates the presenting complaint. The complaint involves an extensive differential diagnosis and also carries with it a high risk of complications and morbidity.    The differential diagnosis includes ACS, pneumonia, anemia, pneumonia, pneumothorax, pulmonary edema, pleural effusion, patient is low risk by Wells criteria making PE unlikely, she has no radiation of the pain and no focal neurologic deficits making dissection less likely, patient has no focal neurologic deficits making a spinal cord injury unlikely  Additional history obtained: Additional history obtained from N/A Records reviewed outpatient cardiology records  ED Course and Reassessment: On patient's arrival to the emergency department she is hemodynamically stable in no acute distress.  EKG performed on arrival showed normal sinus rhythm without acute ischemic changes.  Patient had labs including troponin performed in triage that were within normal range.  Chest x-ray showed no acute disease.  Patient did additionally have a recent calcium coronary CT study in December 2023 that was negative.  The patient is low risk heart score with a score of 3 for age and risk factors making her stable for discharge home  with outpatient follow-up.  Patient was given strict return precautions.  Independent labs interpretation:  The following labs were independently interpreted: within normal range  Independent visualization of imaging: - I independently visualized the following imaging with scope of interpretation limited to determining acute life threatening conditions related to emergency care: CXR, which revealed no acute disease  Consultation: - Consulted or discussed management/test interpretation w/ external professional: N/A  Consideration for admission or further workup: Patient has no emergent conditions requiring admission or further work-up at this time and is stable for discharge home with primary care follow-up  Social Determinants of health: N/A    Amount and/or Complexity of Data Reviewed Labs: ordered. Radiology: ordered.  Risk OTC drugs. Prescription drug management.          Final Clinical Impression(s) / ED Diagnoses Final diagnoses:  Nonspecific chest pain    Rx / DC Orders ED Discharge Orders     None         Rexford Maus, DO 04/23/23 2156

## 2023-04-23 NOTE — Discharge Instructions (Signed)
You were seen in the emergency department for your chest pain.  Your workup showed no signs of heart attack or stress on your heart and no abnormalities within your lungs.  It is unclear what is causing your symptoms but you can take Tylenol as needed for pain.  You can follow-up with your primary doctor to have your symptoms rechecked and your cardiologist as needed.  You should return to the emergency department if your pain gets significantly worse, you have severe shortness of breath, you pass out or if you have any other new or concerning symptoms.

## 2023-04-27 ENCOUNTER — Ambulatory Visit
Admission: RE | Admit: 2023-04-27 | Discharge: 2023-04-27 | Disposition: A | Discharge status: Home / Self Care | Payer: Medicare Other | Source: Ambulatory Visit | Attending: Orthopedic Surgery | Admitting: Orthopedic Surgery

## 2023-04-27 DIAGNOSIS — M50122 Cervical disc disorder at C5-C6 level with radiculopathy: Secondary | ICD-10-CM

## 2023-04-27 DIAGNOSIS — M50121 Cervical disc disorder at C4-C5 level with radiculopathy: Secondary | ICD-10-CM

## 2023-05-06 ENCOUNTER — Ambulatory Visit: Payer: Medicare Other | Attending: Orthopaedic Surgery | Admitting: Physical Therapy

## 2023-05-06 ENCOUNTER — Encounter: Payer: Self-pay | Admitting: Physical Therapy

## 2023-05-06 DIAGNOSIS — M542 Cervicalgia: Secondary | ICD-10-CM | POA: Insufficient documentation

## 2023-05-06 DIAGNOSIS — R252 Cramp and spasm: Secondary | ICD-10-CM | POA: Insufficient documentation

## 2023-05-06 NOTE — Therapy (Signed)
OUTPATIENT PHYSICAL THERAPY CERVICAL EVALUATION   Patient Name: Margaret [name] MRN: [id number] DOB:07/05/1951, 72 y.o., female Today's Date: 05/06/2023  END OF SESSION:  PT End of Session - 05/06/23 1313     Visit Number 1    Date for PT Re-Evaluation 08/06/23    Authorization Type BCBS Medicare    PT Start Time 1311    PT Stop Time 1400    PT Time Calculation (min) 49 min    Activity Tolerance Patient tolerated treatment well    Behavior During Therapy WFL for tasks assessed/performed             Past Medical History:  Diagnosis Date   Allergy    Anemia    Colitis    Dyspareunia    Endometriosis    GERD (gastroesophageal reflux disease)    High cholesterol    Hx of abnormal Pap smear 1980's   Hypertension    Osteopenia    Ulcer    Urinary incontinence    Past Surgical History:  Procedure Laterality Date   ABDOMINAL HYSTERECTOMY  09/2008   TVH/BSO--Dr. Edward Jolly with TVT   BLADDER SUSPENSION  09/2008   Dr. 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   Back pain 04/12/2014   Arthritis 04/12/2014   Knee pain 04/12/2014   Essential hypertension 11/27/2013   Osteoarthritis of left knee 07/16/2012    PCP: Caffie Damme, MD  REFERRING PROVIDER: Sharolyn Douglas, MD  REFERRING DIAG: Cervical disc disorder  THERAPY DIAG:  Cervicalgia  Cramp and spasm  Rationale for  Evaluation and Treatment: Rehabilitation  ONSET DATE: 04/21/23  SUBJECTIVE:                                                                                                                                                                                                         SUBJECTIVE STATEMENT: Patient reports that she has had neck pain for a number of years, she reports that over the past few months she  started having issues with the right arm and some weakness Hand dominance: Right  PERTINENT HISTORY:  As above  PAIN:  Are you having pain? Yes: NPRS scale: 2/10 Pain location: neck and right arm Pain description: ache, tight, spasm, tingling in the right arm Aggravating factors: difficulty sleeping, lifting, housework  pain up to 7-8/10 Relieving factors: prednisone has really helped, cream, pain can be 2/10  PRECAUTIONS: None  WEIGHT BEARING RESTRICTIONS: No  FALLS:  Has patient fallen in last 6 months? No  LIVING ENVIRONMENT: Lives with: lives with their spouse Lives in: House/apartment Stairs: No Has following equipment at home: None  OCCUPATION: retired  PLOF: Independent and house work, walks dog 2x/day  PATIENT GOALS: less pain, less right arm issues  NEXT MD VISIT: none scheduled  OBJECTIVE:   DIAGNOSTIC FINDINGS:   IMPRESSION: 1. 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 Other than mild discogenic edema at C4-5, little change since 2021. 2. Mild foraminal and spinal canal narrowing at C4-5 and C5-6.  PATIENT SURVEYS:  FOTO 39  COGNITION: Overall cognitive status: Within functional limits for tasks assessed  SENSATION: WFL  POSTURE: rounded shoulders and forward head  PALPATION: She has tightness and tenderness in the upper traps, rhomboids and cervical paraspinals   CERVICAL ROM:   Active ROM A/PROM (deg) eval  Flexion 50%  Extension 75% limited  Right lateral flexion 50% limited  Left lateral flexion 50% limited  Right rotation Decreased 50%   Left rotation Decreased 25%   (Blank rows = not tested)  UPPER EXTREMITY ROM:  Active ROM Right eval Left eval  Shoulder flexion 140 140  Shoulder extension    Shoulder abduction    Shoulder adduction    Shoulder extension    Shoulder internal rotation 60 60  Shoulder external rotation    Elbow flexion    Elbow extension    Wrist flexion    Wrist extension    Wrist ulnar deviation    Wrist radial deviation    Wrist pronation    Wrist supination     (Blank rows = not tested)  UPPER EXTREMITY MMT:  Grip strength 60#  MMT Right eval Left eval  Shoulder flexion 4- 4-  Shoulder extension    Shoulder abduction    Shoulder adduction    Shoulder extension    Shoulder internal rotation    Shoulder external rotation 4- 4-  Middle trapezius    Lower trapezius    Elbow flexion    Elbow extension    Wrist flexion    Wrist extension    Wrist ulnar deviation    Wrist radial deviation    Wrist pronation    Wrist supination    Grip strength     (Blank rows = not tested)  CERVICAL SPECIAL TESTS:  Spurling's test: Positive  TODAY'S TREATMENT:  DATE:    PATIENT EDUCATION:  Education details: POC/HEP Person educated: Patient Education method: Explanation, Facilities manager, and Handouts Education comprehension: verbalized understanding  HOME EXERCISE PROGRAM: Access Code: [email or web address] URL: [email or web address] Date: 05/06/2023 Prepared by: Stacie Glaze  Exercises - Seated Shoulder Shrugs  - 1 x daily - 7 x weekly - 3 sets - 10 reps - 3 hold - Seated Scapular Retraction  - 2 x daily - 7 x weekly - 2 sets - 10 reps - 3 hold - Seated Upper Trapezius Stretch  - 1 x daily - 7 x weekly - 10 sets - 10 reps - 30 hold  ASSESSMENT:  CLINICAL IMPRESSION: Patient is a 72 y.o. female who was seen today for physical therapy evaluation and  treatment for neck pain.  She has been on prednisone and is feeling better, she has significant tightness in the upper traps and the cervical paraspinals, cervical ROM is limited.   She does have right arm pain and numbness.  Had good relief with occipital release and traction in the past  OBJECTIVE IMPAIRMENTS: decreased activity tolerance, decreased endurance, decreased ROM, decreased strength, increased fascial restrictions, impaired perceived functional ability, increased muscle spasms, impaired flexibility, impaired UE functional use, improper body mechanics, postural dysfunction, and pain.  REHAB POTENTIAL: Good  CLINICAL DECISION MAKING: Stable/uncomplicated  EVALUATION COMPLEXITY: Low   GOALS: Goals reviewed with patient? Yes  SHORT TERM GOALS: Target date: 05/20/23  Independent with initial HEP Goal status: INITIAL  LONG TERM GOALS: Target date: 08/06/23  Understand posture and body mechanics Goal status: INITIAL  2.  Decrease pain 50% Goal status: INITIAL  3.  Increase cervical ROM 25% Goal status: INITIAL  4.  Return to a gym setting safely Goal status: INITIAL  5.  Report numbness and tingling decreased 50% Goal status: INITIAL   PLAN:  PT FREQUENCY: 1-2x/week  PT DURATION: 12 weeks  PLANNED INTERVENTIONS: Therapeutic exercises, Therapeutic activity, Neuromuscular re-education, Balance training, Gait training, Patient/Family education, Self Care, Joint mobilization, Dry Needling, Electrical stimulation, Spinal mobilization, Taping, Traction, Ultrasound, and Manual therapy  PLAN FOR NEXT SESSION: try exercises and add modalities as needed   Ricarda Atayde W, PT 05/06/2023, 1:14 PM

## 2023-05-12 ENCOUNTER — Ambulatory Visit: Payer: Medicare Other | Attending: Orthopedic Surgery | Admitting: Physical Therapy

## 2023-05-12 ENCOUNTER — Encounter: Payer: Self-pay | Admitting: Physical Therapy

## 2023-05-12 DIAGNOSIS — R252 Cramp and spasm: Secondary | ICD-10-CM | POA: Diagnosis present

## 2023-05-12 DIAGNOSIS — M542 Cervicalgia: Secondary | ICD-10-CM | POA: Diagnosis present

## 2023-05-12 DIAGNOSIS — R293 Abnormal posture: Secondary | ICD-10-CM | POA: Insufficient documentation

## 2023-05-12 DIAGNOSIS — M6281 Muscle weakness (generalized): Secondary | ICD-10-CM | POA: Diagnosis present

## 2023-05-12 DIAGNOSIS — M546 Pain in thoracic spine: Secondary | ICD-10-CM | POA: Insufficient documentation

## 2023-05-12 NOTE — Therapy (Signed)
OUTPATIENT PHYSICAL THERAPY CERVICAL TRETMENT   Patient Name: Margaret [name] MRN: [id number] DOB:10-May-1951, 72 y.o., female Today's Date: 05/12/2023  END OF SESSION:  PT End of Session - 05/12/23 1059     Visit Number 2    Date for PT Re-Evaluation 08/06/23    PT Start Time 1100    PT Stop Time 1145    PT Time Calculation (min) 45 min    Activity Tolerance Patient tolerated treatment well    Behavior During Therapy [facility] for tasks assessed/performed             Past Medical History:  Diagnosis Date   Allergy    Anemia    Colitis    Dyspareunia    Endometriosis    GERD (gastroesophageal reflux disease)    High cholesterol    Hx of abnormal Pap smear 1980's   Hypertension    Osteopenia    Ulcer    Urinary incontinence    Past Surgical History:  Procedure Laterality Date   ABDOMINAL HYSTERECTOMY  09/2008   TVH/BSO--Dr. Edward Jolly with TVT   BLADDER SUSPENSION  09/2008   Dr. 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   Back pain 04/12/2014   Arthritis 04/12/2014   Knee pain 04/12/2014   Essential hypertension 11/27/2013   Osteoarthritis of left knee 07/16/2012    PCP: Caffie Damme, MD  REFERRING PROVIDER: Sharolyn Douglas, MD  REFERRING DIAG: Cervical disc disorder  THERAPY DIAG:  Cervicalgia  Cramp and spasm  Pain in thoracic spine  Muscle weakness  (generalized)  Rationale for Evaluation and Treatment: Rehabilitation  ONSET DATE: 04/21/23  SUBJECTIVE:                                                                                                                                                                                                         SUBJECTIVE STATEMENT: DN worked out good, Right now feeling pretty good, a little tired  Hand dominance: Right  PERTINENT HISTORY:  As above  PAIN:  Are you having pain? Yes: NPRS scale: 0/10 Pain location: neck and right arm Pain description: ache, tight, spasm, tingling in the right arm Aggravating factors: difficulty sleeping, lifting, housework  pain up to 7-8/10 Relieving factors: prednisone has really helped, cream, pain can be 2/10  PRECAUTIONS: None  WEIGHT BEARING RESTRICTIONS: No  FALLS:  Has patient fallen in last 6 months? No  LIVING ENVIRONMENT: Lives with: lives with their spouse Lives in: House/apartment Stairs: No Has following equipment at home: None  OCCUPATION: retired  PLOF: Independent and house work, walks dog 2x/day  PATIENT GOALS: less pain, less right arm issues  NEXT MD VISIT: none scheduled  OBJECTIVE:   DIAGNOSTIC FINDINGS:   IMPRESSION: 1. 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 Other than mild discogenic edema at C4-5, little change since 2021. 2. Mild foraminal and spinal canal narrowing at C4-5 and C5-6.  PATIENT SURVEYS:  FOTO 39  COGNITION: Overall cognitive status: Within functional limits for tasks assessed  SENSATION: WFL  POSTURE: rounded shoulders and forward head  PALPATION: She has tightness and tenderness in the upper traps, rhomboids and cervical paraspinals   CERVICAL ROM:   Active ROM A/PROM (deg) eval  Flexion 50%  Extension 75% limited  Right lateral flexion 50% limited  Left lateral flexion 50% limited  Right rotation Decreased 50%  Left rotation Decreased 25%   (Blank rows = not tested)  UPPER  EXTREMITY ROM:  Active ROM Right eval Left eval  Shoulder flexion 140 140  Shoulder extension    Shoulder abduction    Shoulder adduction    Shoulder extension    Shoulder internal rotation 60 60  Shoulder external rotation    Elbow flexion    Elbow extension    Wrist flexion    Wrist extension    Wrist ulnar deviation    Wrist radial deviation    Wrist pronation    Wrist supination     (Blank rows = not tested)  UPPER EXTREMITY MMT:  Grip strength 60#  MMT Right eval Left eval  Shoulder flexion 4- 4-  Shoulder extension    Shoulder abduction    Shoulder adduction    Shoulder extension    Shoulder internal rotation    Shoulder external rotation 4- 4-  Middle trapezius    Lower trapezius    Elbow flexion    Elbow extension    Wrist flexion    Wrist extension    Wrist ulnar deviation    Wrist radial deviation    Wrist pronation    Wrist supination    Grip strength     (Blank rows = not tested)  CERVICAL SPECIAL TESTS:  Spurling's test: Positive  TODAY'S TREATMENT:                                                                                                                              DATE:   05/12/23 UBL L2x 3  min each Standing rows/ext red t-band 2x10 Sitting ER yellow t-band 2x10 STM to B upper traps with trigger point release Manual cervical traction with PROM and trigger point release in supine   PATIENT EDUCATION:  Education details: POC/HEP Person educated: Patient Education method: Explanation, Demonstration, and Handouts Education comprehension: verbalized understanding  HOME EXERCISE PROGRAM: Access Code: [email or web address] URL: [email or web address] Date: 05/06/2023 Prepared by: Stacie Glaze  Exercises - Seated Shoulder Shrugs  - 1 x daily - 7 x weekly - 3 sets - 10 reps - 3 hold - Seated Scapular Retraction  - 2 x daily - 7 x weekly - 2 sets - 10 reps - 3 hold - Seated Upper Trapezius Stretch  - 1 x daily - 7 x weekly - 10  sets - 10 reps - 30 hold  ASSESSMENT:  CLINICAL IMPRESSION: Patient continues to have tingling in her R arm. Treatment focused on UE strengthening which was tolerated well, along with STM. VCs needed for keeping elbows against side during ER. She reported feeling tired and her hands hurting form arthritis during treatment. Patient started feeling relief during STM and had multiple trigger points in upper traps and posterior nec. She would benefit from continued PT to decrease pain and improve functional independence.  OBJECTIVE IMPAIRMENTS: decreased activity tolerance, decreased endurance, decreased ROM, decreased strength, increased fascial restrictions, impaired perceived functional ability, increased muscle spasms, impaired flexibility, impaired UE functional use, improper body mechanics, postural dysfunction, and pain.  REHAB POTENTIAL: Good  CLINICAL DECISION MAKING: Stable/uncomplicated  EVALUATION COMPLEXITY: Low   GOALS Goals reviewed with patient? Yes  SHORT TERM GOALS: Target date: 05/20/23  Independent with initial HEP Goal status: INITIAL  LONG TERM GOALS: Target date: 08/06/23  Understand posture and body mechanics Goal status: INITIAL  2.  Decrease pain 50% Goal status: INITIAL  3.  Increase cervical ROM 25% Goal status: INITIAL  4.  Return to a gym setting safely Goal status: INITIAL  5.  Report numbness and tingling decreased 50% Goal status: INITIAL   PLAN:  PT FREQUENCY: 1-2x/week  PT DURATION: 12 weeks  PLANNED INTERVENTIONS: Therapeutic exercises, Therapeutic activity, Neuromuscular re-education, Balance training, Gait training, Patient/Family education, Self Care, Joint mobilization, Dry Needling, Electrical stimulation, Spinal mobilization, Taping, Traction, Ultrasound, and Manual therapy  PLAN FOR NEXT SESSION: try exercises and add modalities as needed   Grayce Sessions, PTA 05/12/2023, 10:59 AM

## 2023-05-19 ENCOUNTER — Ambulatory Visit: Payer: Medicare Other | Admitting: Physical Therapy

## 2023-05-28 ENCOUNTER — Ambulatory Visit: Payer: Medicare Other | Admitting: Physical Therapy

## 2023-06-03 ENCOUNTER — Ambulatory Visit: Payer: Medicare Other | Admitting: Physical Therapy

## 2023-06-03 ENCOUNTER — Encounter: Payer: Self-pay | Admitting: Physical Therapy

## 2023-06-03 DIAGNOSIS — M6281 Muscle weakness (generalized): Secondary | ICD-10-CM

## 2023-06-03 DIAGNOSIS — R252 Cramp and spasm: Secondary | ICD-10-CM

## 2023-06-03 DIAGNOSIS — M546 Pain in thoracic spine: Secondary | ICD-10-CM

## 2023-06-03 DIAGNOSIS — M542 Cervicalgia: Secondary | ICD-10-CM | POA: Diagnosis not present

## 2023-06-03 NOTE — Therapy (Signed)
OUTPATIENT PHYSICAL THERAPY CERVICAL TRETMENT   Patient Name: Margaret [name] MRN: [id number] DOB:[birth year], 72 y.o., female Today's Date: 06/03/2023  END OF SESSION:  PT End of Session - 06/03/23 1313     Visit Number 3    Date for PT Re-Evaluation 08/06/23    Authorization Type BCBS Medicare    PT Start Time 1311    PT Stop Time 1400    PT Time Calculation (min) 49 min    Activity Tolerance Patient tolerated treatment well    Behavior During Therapy WFL for tasks assessed/performed             Past Medical History:  Diagnosis Date   Allergy    Anemia    Colitis    Dyspareunia    Endometriosis    GERD (gastroesophageal reflux disease)    High cholesterol    Hx of abnormal Pap smear 1980's   Hypertension    Osteopenia    Ulcer    Urinary incontinence    Past Surgical History:  Procedure Laterality Date   ABDOMINAL HYSTERECTOMY  09/2008   TVH/BSO--Dr. Edward Jolly with TVT   BLADDER SUSPENSION  09/2008   Dr. 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   Back pain 04/12/2014   Arthritis 04/12/2014   Knee pain 04/12/2014   Essential hypertension 11/27/2013   Osteoarthritis of left knee 07/16/2012    PCP: Caffie Damme, MD  REFERRING PROVIDER: Sharolyn Douglas, MD  REFERRING DIAG: Cervical disc disorder  THERAPY DIAG:  Cervicalgia  Cramp and spasm  Pain in thoracic  spine  Muscle weakness (generalized)  Rationale for Evaluation and Treatment: Rehabilitation  ONSET DATE: 04/21/23  SUBJECTIVE:                                                                                                                                                                                                         SUBJECTIVE STATEMENT: [place]w to [place], I am sore and a little tense  Hand dominance:  Right  PERTINENT HISTORY:  As above  PAIN:  Are you having pain? Yes: NPRS scale: 4/10 Pain location: neck and right arm Pain description: ache, tight, spasm, tingling in the right arm Aggravating factors: difficulty sleeping, lifting, housework  pain up to 7-8/10 Relieving factors: prednisone has really helped, cream, pain can be 2/10  PRECAUTIONS: None  WEIGHT BEARING RESTRICTIONS: No  FALLS:  Has patient fallen in last 6 months? No  LIVING ENVIRONMENT: Lives with: lives with their spouse Lives in: House/apartment Stairs: No Has following equipment at home: None  OCCUPATION: retired  PLOF: Independent and house work, walks dog 2x/day  PATIENT GOALS: less pain, less right arm issues  NEXT MD VISIT: none scheduled  OBJECTIVE:   DIAGNOSTIC FINDINGS:   IMPRESSION: 1. 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 Other than mild discogenic edema at C4-5, little change since 2021. 2. Mild foraminal and spinal canal narrowing at C4-5 and C5-6.  PATIENT SURVEYS:  FOTO 39  COGNITION: Overall cognitive status: Within functional limits for tasks assessed  SENSATION: WFL  POSTURE: rounded shoulders and forward head  PALPATION: She has tightness and tenderness in the upper traps, rhomboids and cervical paraspinals   CERVICAL ROM:   Active ROM A/PROM (deg) eval  Flexion 50%  Extension 75% limited  Right lateral flexion 50% limited  Left lateral flexion 50% limited  Right rotation Decreased 50%  Left rotation Decreased 25%   (Blank rows = not tested)  UPPER  EXTREMITY ROM:  Active ROM Right eval Left eval  Shoulder flexion 140 140  Shoulder extension    Shoulder abduction    Shoulder adduction    Shoulder extension    Shoulder internal rotation 60 60  Shoulder external rotation    Elbow flexion    Elbow extension    Wrist flexion    Wrist extension    Wrist ulnar deviation    Wrist radial deviation    Wrist pronation    Wrist supination     (Blank rows = not tested)  UPPER EXTREMITY MMT:  Grip strength 60#  MMT Right eval Left eval  Shoulder flexion 4- 4-  Shoulder extension    Shoulder abduction    Shoulder adduction    Shoulder extension    Shoulder internal rotation    Shoulder external rotation 4- 4-  Middle trapezius    Lower trapezius    Elbow flexion    Elbow extension    Wrist flexion    Wrist extension    Wrist ulnar deviation    Wrist radial deviation    Wrist pronation    Wrist supination    Grip strength     (Blank rows = not tested)  CERVICAL SPECIAL TESTS:  Spurling's test: Positive  TODAY'S TREATMENT:                                                                                                                              DATE:  06/03/23 Nustep level 5 x 6 minutes 15# rows cues for posture 15# lats Red tband ER Red tband horizontal abduction Shrugs with upper trap and levator stretches Chin tucks DN to the upper traps STM to the upper traps, genlte stretch and occipital release  05/12/23 UBL L2x 3 min each Standing rows/ext red t-band 2x10 Sitting ER yellow t-band 2x10 STM to B upper traps with trigger point release Manual cervical traction with PROM and trigger point release in supine   PATIENT EDUCATION:  Education details: POC/HEP Person educated: Patient Education method: Explanation, Facilities manager, and Handouts Education comprehension: verbalized understanding  HOME EXERCISE PROGRAM: Access Code: [email or web address] URL: [email or web address] Date: 05/06/2023 Prepared  by: Stacie Glaze  Exercises - Seated Shoulder Shrugs  - 1 x daily - 7 x weekly - 3 sets - 10 reps - 3 hold - Seated Scapular Retraction  - 2 x daily - 7 x weekly - 2 sets - 10 reps - 3 hold - Seated Upper Trapezius Stretch  - 1 x daily - 7 x weekly - 10 sets - 10 reps - 30 hold  ASSESSMENT:  CLINICAL IMPRESSION: Patient went on a trip to [place] and flew to and from, reports that she has had some increased pain and then today while out shopping she had "a run in" and got very frustrated, she has increased tightness in the upper traps and the cervical area.  Tolerated the exercises without increase of pain.  She would benefit from continued PT to decrease pain and improve functional independence.  OBJECTIVE IMPAIRMENTS: decreased activity tolerance, decreased endurance, decreased ROM, decreased strength, increased fascial restrictions, impaired perceived functional ability, increased muscle spasms, impaired flexibility, impaired UE functional use, improper body mechanics, postural dysfunction, and pain.  REHAB POTENTIAL: Good  CLINICAL DECISION MAKING: Stable/uncomplicated  EVALUATION COMPLEXITY: Low   GOALS Goals reviewed with patient? Yes  SHORT TERM GOALS: Target date: 05/20/23  Independent with initial HEP Goal status: met 06/03/23  LONG TERM GOALS: Target date: 08/06/23  Understand posture and body mechanics Goal status: INITIAL  2.  Decrease pain 50% Goal status: INITIAL  3.  Increase cervical ROM 25% Goal status: INITIAL  4.  Return to a gym setting safely Goal status: INITIAL  5.  Report numbness and tingling decreased 50% Goal status: INITIAL   PLAN:  PT FREQUENCY: 1-2x/week  PT DURATION: 12 weeks  PLANNED INTERVENTIONS: Therapeutic exercises, Therapeutic activity, Neuromuscular re-education, Balance training, Gait training, Patient/Family education, Self Care, Joint mobilization, Dry Needling, Electrical stimulation, Spinal mobilization, Taping, Traction,  Ultrasound, and Manual therapy  PLAN FOR NEXT SESSION: try exercises and add modalities as needed   Diya Gervasi W, PT 06/03/2023, 1:13 PM

## 2023-06-06 ENCOUNTER — Encounter: Payer: Self-pay | Admitting: Physical Therapy

## 2023-06-06 ENCOUNTER — Ambulatory Visit: Payer: Medicare Other | Admitting: Physical Therapy

## 2023-06-06 DIAGNOSIS — R252 Cramp and spasm: Secondary | ICD-10-CM

## 2023-06-06 DIAGNOSIS — M546 Pain in thoracic spine: Secondary | ICD-10-CM

## 2023-06-06 DIAGNOSIS — M542 Cervicalgia: Secondary | ICD-10-CM | POA: Diagnosis not present

## 2023-06-06 DIAGNOSIS — R293 Abnormal posture: Secondary | ICD-10-CM

## 2023-06-06 NOTE — Therapy (Signed)
OUTPATIENT PHYSICAL THERAPY CERVICAL TRETMENT   Patient Name: Margaret [name] MRN: [id number] DOB:1951/08/04, 72 y.o., female Today's Date: 06/06/2023  END OF SESSION:  PT End of Session - 06/06/23 0937     Visit Number 4    Date for PT Re-Evaluation 08/06/23    PT Start Time 0935    PT Stop Time 1015    PT Time Calculation (min) 40 min    Activity Tolerance Patient tolerated treatment well    Behavior During Therapy [facility] for tasks assessed/performed             Past Medical History:  Diagnosis Date   Allergy    Anemia    Colitis    Dyspareunia    Endometriosis    GERD (gastroesophageal reflux disease)    High cholesterol    Hx of abnormal Pap smear 1980's   Hypertension    Osteopenia    Ulcer    Urinary incontinence    Past Surgical History:  Procedure Laterality Date   ABDOMINAL HYSTERECTOMY  09/2008   TVH/BSO--Dr. Edward Jolly with TVT   BLADDER SUSPENSION  09/2008   Dr. 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   Back pain 04/12/2014   Arthritis 04/12/2014   Knee pain 04/12/2014   Essential hypertension 11/27/2013   Osteoarthritis of left knee 07/16/2012    PCP: Caffie Damme, MD  REFERRING PROVIDER: Sharolyn Douglas, MD  REFERRING DIAG: Cervical disc disorder  THERAPY DIAG:  Cervicalgia  Cramp and spasm  Pain in thoracic spine  Abnormal posture  Rationale  for Evaluation and Treatment: Rehabilitation  ONSET DATE: 04/21/23  SUBJECTIVE:                                                                                                                                                                                                         SUBJECTIVE STATEMENT: "Stressed" It was struggle but ne I got up and got moving I'm good.   Hand dominance: Right  PERTINENT  HISTORY:  As above  PAIN:  Are you having pain? Yes: NPRS scale: 3/10 Pain location: neck and right arm Pain description: ache, tight, spasm, tingling in the right arm Aggravating factors: difficulty sleeping, lifting, housework  pain up to 7-8/10 Relieving factors: prednisone has really helped, cream, pain can be 2/10  PRECAUTIONS: None  WEIGHT BEARING RESTRICTIONS: No  FALLS:  Has patient fallen in last 6 months? No  LIVING ENVIRONMENT: Lives with: lives with their spouse Lives in: House/apartment Stairs: No Has following equipment at home: None  OCCUPATION: retired  PLOF: Independent and house work, walks dog 2x/day  PATIENT GOALS: less pain, less right arm issues  NEXT MD VISIT: none scheduled  OBJECTIVE:   DIAGNOSTIC FINDINGS:   IMPRESSION: 1. 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 Other than mild discogenic edema at C4-5, little change since 2021. 2. Mild foraminal and spinal canal narrowing at C4-5 and C5-6.  PATIENT SURVEYS:  FOTO 39  COGNITION: Overall cognitive status: Within functional limits for tasks assessed  SENSATION: WFL  POSTURE: rounded shoulders and forward head  PALPATION: She has tightness and tenderness in the upper traps, rhomboids and cervical paraspinals   CERVICAL ROM:   Active ROM A/PROM (deg) eval  Flexion 50%  Extension 75% limited  Right lateral flexion 50% limited  Left lateral flexion 50% limited  Right rotation Decreased 50%  Left rotation Decreased 25%   (Blank rows = not tested)  UPPER EXTREMITY ROM:  Active  ROM Right eval Left eval  Shoulder flexion 140 140  Shoulder extension    Shoulder abduction    Shoulder adduction    Shoulder extension    Shoulder internal rotation 60 60  Shoulder external rotation    Elbow flexion    Elbow extension    Wrist flexion    Wrist extension    Wrist ulnar deviation    Wrist radial deviation    Wrist pronation    Wrist supination     (Blank rows = not tested)  UPPER EXTREMITY MMT:  Grip strength 60#  MMT Right eval Left eval  Shoulder flexion 4- 4-  Shoulder extension    Shoulder abduction    Shoulder adduction    Shoulder extension    Shoulder internal rotation    Shoulder external rotation 4- 4-  Middle trapezius    Lower trapezius    Elbow flexion    Elbow extension    Wrist flexion    Wrist extension    Wrist ulnar deviation    Wrist radial deviation    Wrist pronation    Wrist supination    Grip strength     (Blank rows = not tested)  CERVICAL SPECIAL TESTS:  Spurling's test: Positive  TODAY'S TREATMENT:                                                                                                                              DATE:   06/06/23 UBE  L 2 x each 15# rows cues for posture 15# lats Red tband ER Red tband horizontal abduction Shoulder abd 2lb 2x10 Shoulder Ext 5lb 2x10  STM to the upper traps, genlte stretch and occipital release  06/03/23 Nustep level 5 x 6 minutes 15# rows cues for posture 15# lats Red tband ER Red tband horizontal abduction Shrugs with upper trap and levator stretches Chin tucks DN to the upper traps STM to the upper traps, genlte stretch and occipital release  05/12/23 UBL L2x 3 min each Standing rows/ext red t-band 2x10 Sitting ER yellow t-band 2x10 STM to B upper traps with trigger point release Manual cervical traction with PROM and trigger point release in supine   PATIENT EDUCATION:  Education details: POC/HEP Person educated: Patient Education method: Explanation,  Facilities manager, and Handouts Education comprehension: verbalized understanding  HOME EXERCISE PROGRAM: Access Code: [email or web address] URL: [email or web address] Date: 05/06/2023 Prepared by: Stacie Glaze  Exercises - Seated Shoulder Shrugs  - 1 x daily - 7 x weekly - 3 sets - 10 reps - 3 hold - Seated Scapular Retraction  - 2 x daily - 7 x weekly - 2 sets - 10 reps - 3 hold - Seated Upper Trapezius Stretch  - 1 x daily - 7 x weekly - 10 sets - 10 reps - 30 hold  ASSESSMENT:  CLINICAL IMPRESSION: Pt enters doing well with reports of tightness in the shoulder and UT. Continued with postural strengthening, pt has forward shoulder at rest. No pain during session. Tactile cue to elbows with ER needed to keep UE in proper location.  She would benefit from continued PT to decrease pain and improve functional independence.  OBJECTIVE IMPAIRMENTS: decreased activity tolerance, decreased endurance, decreased ROM, decreased strength, increased fascial restrictions, impaired perceived functional ability, increased muscle spasms, impaired flexibility, impaired UE functional use, improper body mechanics, postural dysfunction, and pain.  REHAB POTENTIAL: Good  CLINICAL DECISION MAKING: Stable/uncomplicated  EVALUATION COMPLEXITY: Low   GOALS Goals reviewed with patient? Yes  SHORT TERM GOALS: Target date: 05/20/23  Independent with initial HEP Goal status: met 06/03/23  LONG TERM GOALS: Target date: 08/06/23  Understand posture and body mechanics Goal status: INITIAL  2.  Decrease pain 50% Goal status: INITIAL  3.  Increase cervical ROM 25% Goal status: INITIAL  4.  Return to a gym setting safely Goal status: INITIAL  5.  Report numbness and tingling decreased 50% Goal status: INITIAL   PLAN:  PT FREQUENCY: 1-2x/week  PT DURATION: 12 weeks  PLANNED INTERVENTIONS: Therapeutic exercises, Therapeutic activity, Neuromuscular re-education, Balance training, Gait training,  Patient/Family education, Self Care, Joint mobilization, Dry Needling, Electrical stimulation, Spinal mobilization, Taping, Traction, Ultrasound, and Manual therapy  PLAN FOR NEXT SESSION: try exercises and add modalities as needed   Grayce Sessions, PTA 06/06/2023, 9:38 AM

## 2023-06-11 ENCOUNTER — Ambulatory Visit: Payer: Medicare Other | Admitting: Physical Therapy

## 2023-06-11 ENCOUNTER — Encounter: Payer: Self-pay | Admitting: Physical Therapy

## 2023-06-11 DIAGNOSIS — R252 Cramp and spasm: Secondary | ICD-10-CM

## 2023-06-11 DIAGNOSIS — M546 Pain in thoracic spine: Secondary | ICD-10-CM

## 2023-06-11 DIAGNOSIS — R293 Abnormal posture: Secondary | ICD-10-CM

## 2023-06-11 DIAGNOSIS — M542 Cervicalgia: Secondary | ICD-10-CM | POA: Diagnosis not present

## 2023-06-11 DIAGNOSIS — M6281 Muscle weakness (generalized): Secondary | ICD-10-CM

## 2023-06-11 NOTE — Therapy (Signed)
OUTPATIENT PHYSICAL THERAPY CERVICAL TRETMENT   Patient Name: Margaret [name] MRN: [id number] DOB:1951-08-05, 72 y.o., female Today's Date: 06/11/2023  END OF SESSION:  PT End of Session - 06/11/23 1401     Visit Number 5    Date for PT Re-Evaluation 08/06/23    Authorization Type [employer] Medicare    PT Start Time 1357    PT Stop Time 1440    PT Time Calculation (min) 43 min    Activity Tolerance Patient tolerated treatment well    Behavior During Therapy WFL for tasks assessed/performed             Past Medical History:  Diagnosis Date   Allergy    Anemia    Colitis    Dyspareunia    Endometriosis    GERD (gastroesophageal reflux disease)    High cholesterol    Hx of abnormal Pap smear 1980's   Hypertension    Osteopenia    Ulcer    Urinary incontinence    Past Surgical History:  Procedure Laterality Date   ABDOMINAL HYSTERECTOMY  09/2008   TVH/BSO--Dr. Edward Jolly with TVT   BLADDER SUSPENSION  09/2008   Dr. 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   Back pain 04/12/2014   Arthritis 04/12/2014   Knee pain 04/12/2014   Essential hypertension 11/27/2013   Osteoarthritis of left knee 07/16/2012    PCP: Caffie Damme, MD  REFERRING PROVIDER: Sharolyn Douglas, MD  REFERRING DIAG: Cervical disc disorder  THERAPY DIAG:  Cervicalgia  Cramp and spasm  Pain in thoracic  spine  Abnormal posture  Muscle weakness (generalized)  Rationale for Evaluation and Treatment: Rehabilitation  ONSET DATE: 04/21/23  SUBJECTIVE:                                                                                                                                                                                                         SUBJECTIVE STATEMENT: "Continue stress of going to reunion, travelling to [place] tomorrow  for a few weeks"  A litle tight and painful   Hand dominance: Right  PERTINENT HISTORY:  As above  PAIN:  Are you having pain? Yes: NPRS scale: 3/10 Pain location: neck and right arm Pain description: ache, tight, spasm, tingling in the right arm Aggravating factors: difficulty sleeping, lifting, housework  pain up to 7-8/10 Relieving factors: prednisone has really helped, cream, pain can be 2/10  PRECAUTIONS: None  WEIGHT BEARING RESTRICTIONS: No  FALLS:  Has patient fallen in last 6 months? No  LIVING ENVIRONMENT: Lives with: lives with their spouse Lives in: House/apartment Stairs: No Has following equipment at home: None  OCCUPATION: retired  PLOF: Independent and house work, walks dog 2x/day  PATIENT GOALS: less pain, less right arm issues  NEXT MD VISIT: none scheduled  OBJECTIVE:   DIAGNOSTIC FINDINGS:   IMPRESSION: 1. 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 Other than mild discogenic edema at C4-5, little change since 2021. 2. Mild foraminal and spinal canal narrowing at C4-5 and C5-6.  PATIENT SURVEYS:  FOTO 39  COGNITION: Overall cognitive status: Within functional limits for tasks assessed  SENSATION: WFL  POSTURE: rounded shoulders and forward head  PALPATION: She has tightness and tenderness in the upper traps, rhomboids and cervical paraspinals   CERVICAL ROM:   Active ROM A/PROM (deg) eval  Flexion 50%  Extension 75% limited  Right lateral flexion 50% limited  Left lateral flexion 50% limited  Right  rotation Decreased 50%  Left rotation Decreased 25%   (Blank rows = not tested)  UPPER EXTREMITY ROM:  Active ROM Right eval Left eval  Shoulder flexion 140 140  Shoulder extension    Shoulder abduction    Shoulder adduction    Shoulder extension    Shoulder internal rotation 60 60  Shoulder external rotation    Elbow flexion    Elbow extension    Wrist flexion    Wrist extension    Wrist ulnar deviation    Wrist radial deviation    Wrist pronation    Wrist supination     (Blank rows = not tested)  UPPER EXTREMITY MMT:  Grip strength 60#  MMT Right eval Left eval  Shoulder flexion 4- 4-  Shoulder extension    Shoulder abduction    Shoulder adduction    Shoulder extension    Shoulder internal rotation    Shoulder external rotation 4- 4-  Middle trapezius    Lower trapezius    Elbow flexion    Elbow extension    Wrist flexion    Wrist extension    Wrist ulnar deviation    Wrist radial deviation    Wrist pronation    Wrist supination    Grip strength     (Blank rows = not tested)  CERVICAL SPECIAL TESTS:  Spurling's test: Positive  TODAY'S TREATMENT:  DATE:   06/11/23 Nustep level 5 x 6 minutes 15# rows 15# Lats Red tband ER 4# shrugs with upper trap and levator stretches 5# shoulder extension Reviewed posture and body mechanics for her trip and lifting baggage STM to the upper trap, rhomboids and into the neck  06/06/23 UBE L 2 x each 15# rows cues for posture 15# lats Red tband ER Red tband horizontal abduction Shoulder abd 2lb 2x10 Shoulder Ext 5lb 2x10  STM to the upper traps, genlte stretch and occipital release  06/03/23 Nustep level 5 x 6 minutes 15# rows cues for posture 15# lats Red tband ER Red tband horizontal abduction Shrugs with upper trap and levator stretches Chin tucks DN to the upper traps STM  to the upper traps, genlte stretch and occipital release  05/12/23 UBL L2x 3 min each Standing rows/ext red t-band 2x10 Sitting ER yellow t-band 2x10 STM to B upper traps with trigger point release Manual cervical traction with PROM and trigger point release in supine   PATIENT EDUCATION:  Education details: POC/HEP Person educated: Patient Education method: Explanation, Facilities manager, and Handouts Education comprehension: verbalized understanding  HOME EXERCISE PROGRAM: Access Code: [email or web address] URL: [email or web address] Date: 05/06/2023 Prepared by: Stacie Glaze  Exercises - Seated Shoulder Shrugs  - 1 x daily - 7 x weekly - 3 sets - 10 reps - 3 hold - Seated Scapular Retraction  - 2 x daily - 7 x weekly - 2 sets - 10 reps - 3 hold - Seated Upper Trapezius Stretch  - 1 x daily - 7 x weekly - 10 sets - 10 reps - 30 hold  ASSESSMENT:  CLINICAL IMPRESSION: Pt has some increased pain in the neck and upper traps, she reports stress of travelling tomorrow, she will be away fro two weeks to a family reunion.  We discussed posture and body mechanics for carrying her dog and her bags as she travels,  She would benefit from continued PT to decrease pain and improve functional independence.  OBJECTIVE IMPAIRMENTS: decreased activity tolerance, decreased endurance, decreased ROM, decreased strength, increased fascial restrictions, impaired perceived functional ability, increased muscle spasms, impaired flexibility, impaired UE functional use, improper body mechanics, postural dysfunction, and pain.  REHAB POTENTIAL: Good  CLINICAL DECISION MAKING: Stable/uncomplicated  EVALUATION COMPLEXITY: Low   GOALS Goals reviewed with patient? Yes  SHORT TERM GOALS: Target date: 05/20/23  Independent with initial HEP Goal status: met 06/03/23  LONG TERM GOALS: Target date: 08/06/23  Understand posture and body mechanics Goal status: met 06/11/23  2.  Decrease pain 50% Goal  status: ongoing 06/11/23  3.  Increase cervical ROM 25% Goal status: INITIAL  4.  Return to a gym setting safely Goal status: INITIAL  5.  Report numbness and tingling decreased 50% Goal status: INITIAL   PLAN:  PT FREQUENCY: 1-2x/week  PT DURATION: 12 weeks  PLANNED INTERVENTIONS: Therapeutic exercises, Therapeutic activity, Neuromuscular re-education, Balance training, Gait training, Patient/Family education, Self Care, Joint mobilization, Dry Needling, Electrical stimulation, Spinal mobilization, Taping, Traction, Ultrasound, and Manual therapy  PLAN FOR NEXT SESSION: she will be a way for two weeks, would like to see how she is doing when she returns and progress as she tolerates   Jearld Lesch, PT 06/11/2023, 2:01 PM

## 2023-06-30 ENCOUNTER — Other Ambulatory Visit: Payer: Self-pay | Admitting: Obstetrics and Gynecology

## 2023-06-30 DIAGNOSIS — Z0000 Encounter for general adult medical examination without abnormal findings: Secondary | ICD-10-CM

## 2023-07-01 ENCOUNTER — Encounter: Payer: Self-pay | Admitting: Physical Therapy

## 2023-07-01 ENCOUNTER — Ambulatory Visit: Payer: Medicare Other | Attending: Family Medicine | Admitting: Physical Therapy

## 2023-07-01 DIAGNOSIS — R252 Cramp and spasm: Secondary | ICD-10-CM | POA: Diagnosis present

## 2023-07-01 DIAGNOSIS — M542 Cervicalgia: Secondary | ICD-10-CM | POA: Diagnosis present

## 2023-07-01 DIAGNOSIS — M6281 Muscle weakness (generalized): Secondary | ICD-10-CM | POA: Insufficient documentation

## 2023-07-01 DIAGNOSIS — M546 Pain in thoracic spine: Secondary | ICD-10-CM | POA: Diagnosis present

## 2023-07-01 DIAGNOSIS — R293 Abnormal posture: Secondary | ICD-10-CM | POA: Diagnosis present

## 2023-07-01 NOTE — Therapy (Signed)
OUTPATIENT PHYSICAL THERAPY CERVICAL TRETMENT   Patient Name: Margaret [name] MRN: [id number] DOB:[birth year], 72 y.o., female Today's Date: 07/01/2023  END OF SESSION:  PT End of Session - 07/01/23 1321     Visit Number 6    Date for PT Re-Evaluation 08/06/23    Authorization Type BCBS Medicare    PT Start Time 1318    PT Stop Time 1400    PT Time Calculation (min) 42 min    Activity Tolerance Patient tolerated treatment well    Behavior During Therapy WFL for tasks assessed/performed             Past Medical History:  Diagnosis Date   Allergy    Anemia    Colitis    Dyspareunia    Endometriosis    GERD (gastroesophageal reflux disease)    High cholesterol    Hx of abnormal Pap smear 1980's   Hypertension    Osteopenia    Ulcer    Urinary incontinence    Past Surgical History:  Procedure Laterality Date   ABDOMINAL HYSTERECTOMY  09/2008   TVH/BSO--Dr. Edward Jolly with TVT   BLADDER SUSPENSION  09/2008   Dr. 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   Back pain 04/12/2014   Arthritis 04/12/2014   Knee pain 04/12/2014   Essential hypertension 11/27/2013   Osteoarthritis of left knee 07/16/2012    PCP: Caffie Damme, MD  REFERRING PROVIDER: Sharolyn Douglas, MD  REFERRING DIAG: Cervical disc disorder  THERAPY DIAG:  Cervicalgia  Cramp and spasm  Pain in thoracic  spine  Abnormal posture  Muscle weakness (generalized)  Rationale for Evaluation and Treatment: Rehabilitation  ONSET DATE: 04/21/23  SUBJECTIVE:                                                                                                                                                                                                         SUBJECTIVE STATEMENT: Reports very tired and sore with her travels has been  out of town for the past few weeks.  Hand dominance: Right  PERTINENT HISTORY:  As above  PAIN:  Are you having pain? Yes: NPRS scale: 3/10 Pain location: neck and right arm Pain description: ache, tight, spasm, tingling in the right arm Aggravating factors: difficulty sleeping, lifting, housework  pain up to 7-8/10 Relieving factors: prednisone has really helped, cream, pain can be 2/10  PRECAUTIONS: None  WEIGHT BEARING RESTRICTIONS: No  FALLS:  Has patient fallen in last 6 months? No  LIVING ENVIRONMENT: Lives with: lives with their spouse Lives in: House/apartment Stairs: No Has following equipment at home: None  OCCUPATION: retired  PLOF: Independent and house work, walks dog 2x/day  PATIENT GOALS: less pain, less right arm issues  NEXT MD VISIT: none scheduled  OBJECTIVE:   DIAGNOSTIC FINDINGS:   IMPRESSION: 1. 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 Other than mild discogenic edema at C4-5, little change since 2021. 2. Mild foraminal and spinal canal narrowing at C4-5 and C5-6.  PATIENT SURVEYS:  FOTO 39  COGNITION: Overall cognitive status: Within functional limits for tasks assessed  SENSATION: WFL  POSTURE: rounded shoulders and forward head  PALPATION: She has tightness and tenderness in the upper traps, rhomboids and cervical paraspinals   CERVICAL ROM:   Active ROM A/PROM (deg) eval  Flexion 50%  Extension 75% limited  Right lateral flexion 50% limited  Left lateral flexion 50% limited  Right rotation Decreased 50%   Left rotation Decreased 25%   (Blank rows = not tested)  UPPER EXTREMITY ROM:  Active ROM Right eval Left eval  Shoulder flexion 140 140  Shoulder extension    Shoulder abduction    Shoulder adduction    Shoulder extension    Shoulder internal rotation 60 60  Shoulder external rotation    Elbow flexion    Elbow extension    Wrist flexion    Wrist extension    Wrist ulnar deviation    Wrist radial deviation    Wrist pronation    Wrist supination     (Blank rows = not tested)  UPPER EXTREMITY MMT:  Grip strength 60#  MMT Right eval Left eval  Shoulder flexion 4- 4-  Shoulder extension    Shoulder abduction    Shoulder adduction    Shoulder extension    Shoulder internal rotation    Shoulder external rotation 4- 4-  Middle trapezius    Lower trapezius    Elbow flexion    Elbow extension    Wrist flexion    Wrist extension    Wrist ulnar deviation    Wrist radial deviation    Wrist pronation    Wrist supination    Grip strength     (Blank rows = not tested)  CERVICAL SPECIAL TESTS:  Spurling's test: Positive  TODAY'S TREATMENT:  DATE:   07/01/23 UBE level 3 x 5 mintues 15# rows 2x10 Lats 15# 2x10 Red tband horizontal abduction Red tband ER STM to the upper traps, rhomboids and cervical area  06/11/23 Nustep level 5 x 6 minutes 15# rows 15# Lats Red tband ER 4# shrugs with upper trap and levator stretches 5# shoulder extension Reviewed posture and body mechanics for her trip and lifting baggage STM to the upper trap, rhomboids and into the neck  06/06/23 UBE L 2 x each 15# rows cues for posture 15# lats Red tband ER Red tband horizontal abduction Shoulder abd 2lb 2x10 Shoulder Ext 5lb 2x10  STM to the upper traps, genlte stretch and occipital release  06/03/23 Nustep level 5 x 6 minutes 15# rows cues for  posture 15# lats Red tband ER Red tband horizontal abduction Shrugs with upper trap and levator stretches Chin tucks DN to the upper traps STM to the upper traps, genlte stretch and occipital release  05/12/23 UBL L2x 3 min each Standing rows/ext red t-band 2x10 Sitting ER yellow t-band 2x10 STM to B upper traps with trigger point release Manual cervical traction with PROM and trigger point release in supine   PATIENT EDUCATION:  Education details: POC/HEP Person educated: Patient Education method: Explanation, Facilities manager, and Handouts Education comprehension: verbalized understanding  HOME EXERCISE PROGRAM: Access Code: [email or web address] URL: [email or web address] Date: 05/06/2023 Prepared by: Stacie Glaze  Exercises - Seated Shoulder Shrugs  - 1 x daily - 7 x weekly - 3 sets - 10 reps - 3 hold - Seated Scapular Retraction  - 2 x daily - 7 x weekly - 2 sets - 10 reps - 3 hold - Seated Upper Trapezius Stretch  - 1 x daily - 7 x weekly - 10 sets - 10 reps - 30 hold  ASSESSMENT:  CLINICAL IMPRESSION: Pt continues to have some stress and pain with the travel and the organizing a family reunion.  She is very tight in the upper traps and the neck and rhomboids.  She would benefit from continued PT to decrease pain and improve functional independence.  OBJECTIVE IMPAIRMENTS: decreased activity tolerance, decreased endurance, decreased ROM, decreased strength, increased fascial restrictions, impaired perceived functional ability, increased muscle spasms, impaired flexibility, impaired UE functional use, improper body mechanics, postural dysfunction, and pain.  REHAB POTENTIAL: Good  CLINICAL DECISION MAKING: Stable/uncomplicated  EVALUATION COMPLEXITY: Low   GOALS Goals reviewed with patient? Yes  SHORT TERM GOALS: Target date: 05/20/23  Independent with initial HEP Goal status: met 06/03/23  LONG TERM GOALS: Target date: 08/06/23  Understand posture and body  mechanics Goal status: met 06/11/23  2.  Decrease pain 50% Goal status: ongoing 06/11/23  3.  Increase cervical ROM 25% Goal status: ongoing 07/01/23  4.  Return to a gym setting safely Goal status: INITIAL  5.  Report numbness and tingling decreased 50% Goal status: ongoing 07/01/23   PLAN:  PT FREQUENCY: 1-2x/week  PT DURATION: 12 weeks  PLANNED INTERVENTIONS: Therapeutic exercises, Therapeutic activity, Neuromuscular re-education, Balance training, Gait training, Patient/Family education, Self Care, Joint mobilization, Dry Needling, Electrical stimulation, Spinal mobilization, Taping, Traction, Ultrasound, and Manual therapy  PLAN FOR NEXT SESSION:progress gym activities  Caroleann Casler W, PT 07/01/2023, 1:22 PM

## 2023-07-03 ENCOUNTER — Ambulatory Visit: Payer: Medicare Other | Admitting: Physical Therapy

## 2023-07-03 ENCOUNTER — Encounter: Payer: Self-pay | Admitting: Physical Therapy

## 2023-07-03 DIAGNOSIS — M542 Cervicalgia: Secondary | ICD-10-CM | POA: Diagnosis not present

## 2023-07-03 DIAGNOSIS — M6281 Muscle weakness (generalized): Secondary | ICD-10-CM

## 2023-07-03 DIAGNOSIS — R293 Abnormal posture: Secondary | ICD-10-CM

## 2023-07-03 DIAGNOSIS — R252 Cramp and spasm: Secondary | ICD-10-CM

## 2023-07-03 DIAGNOSIS — M546 Pain in thoracic spine: Secondary | ICD-10-CM

## 2023-07-03 NOTE — Therapy (Signed)
OUTPATIENT PHYSICAL THERAPY CERVICAL TRETMENT   Patient Name: Margaret [name] MRN: [id number] DOB:[birth year], 72 y.o., female Today's Date: 07/03/2023  END OF SESSION:  PT End of Session - 07/03/23 1320     Visit Number 7    Date for PT Re-Evaluation 08/06/23    Authorization Type BCBS Medicare    PT Start Time 1312    PT Stop Time 1400    PT Time Calculation (min) 48 min    Activity Tolerance Patient tolerated treatment well    Behavior During Therapy WFL for tasks assessed/performed             Past Medical History:  Diagnosis Date   Allergy    Anemia    Colitis    Dyspareunia    Endometriosis    GERD (gastroesophageal reflux disease)    High cholesterol    Hx of abnormal Pap smear 1980's   Hypertension    Osteopenia    Ulcer    Urinary incontinence    Past Surgical History:  Procedure Laterality Date   ABDOMINAL HYSTERECTOMY  09/2008   TVH/BSO--Dr. Edward Jolly with TVT   BLADDER SUSPENSION  09/2008   Dr. 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   Back pain 04/12/2014   Arthritis 04/12/2014   Knee pain 04/12/2014   Essential hypertension 11/27/2013   Osteoarthritis of left knee 07/16/2012    PCP: Caffie Damme, MD  REFERRING PROVIDER: Sharolyn Douglas, MD  REFERRING DIAG: Cervical disc disorder  THERAPY DIAG:  Cervicalgia  Cramp and spasm  Pain in thoracic  spine  Abnormal posture  Muscle weakness (generalized)  Rationale for Evaluation and Treatment: Rehabilitation  ONSET DATE: 04/21/23  SUBJECTIVE:                                                                                                                                                                                                         SUBJECTIVE STATEMENT: Felt pretty good after the last treatment  Hand dominance:  Right  PERTINENT HISTORY:  As above  PAIN:  Are you having pain? Yes: NPRS scale: 3/10 Pain location: neck and right arm Pain description: ache, tight, spasm, tingling in the right arm Aggravating factors: difficulty sleeping, lifting, housework  pain up to 7-8/10 Relieving factors: prednisone has really helped, cream, pain can be 2/10  PRECAUTIONS: None  WEIGHT BEARING RESTRICTIONS: No  FALLS:  Has patient fallen in last 6 months? No  LIVING ENVIRONMENT: Lives with: lives with their spouse Lives in: House/apartment Stairs: No Has following equipment at home: None  OCCUPATION: retired  PLOF: Independent and house work, walks dog 2x/day  PATIENT GOALS: less pain, less right arm issues  NEXT MD VISIT: none scheduled  OBJECTIVE:   DIAGNOSTIC FINDINGS:   IMPRESSION: 1. 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 Other than mild discogenic edema at C4-5, little change since 2021. 2. Mild foraminal and spinal canal narrowing at C4-5 and C5-6.  PATIENT SURVEYS:  FOTO 39  COGNITION: Overall cognitive status: Within functional limits for tasks assessed  SENSATION: WFL  POSTURE: rounded shoulders and forward head  PALPATION: She has tightness and tenderness in the upper traps, rhomboids and cervical paraspinals   CERVICAL ROM:   Active ROM A/PROM (deg) eval  Flexion 50%  Extension 75% limited  Right lateral flexion 50% limited  Left lateral flexion 50% limited  Right rotation Decreased 50%  Left rotation Decreased 25%   (Blank rows = not  tested)  UPPER EXTREMITY ROM:  Active ROM Right eval Left eval  Shoulder flexion 140 140  Shoulder extension    Shoulder abduction    Shoulder adduction    Shoulder extension    Shoulder internal rotation 60 60  Shoulder external rotation    Elbow flexion    Elbow extension    Wrist flexion    Wrist extension    Wrist ulnar deviation    Wrist radial deviation    Wrist pronation    Wrist supination     (Blank rows = not tested)  UPPER EXTREMITY MMT:  Grip strength 60#  MMT Right eval Left eval  Shoulder flexion 4- 4-  Shoulder extension    Shoulder abduction    Shoulder adduction    Shoulder extension    Shoulder internal rotation    Shoulder external rotation 4- 4-  Middle trapezius    Lower trapezius    Elbow flexion    Elbow extension    Wrist flexion    Wrist extension    Wrist ulnar deviation    Wrist radial deviation    Wrist pronation    Wrist supination    Grip strength     (Blank rows = not tested)  CERVICAL SPECIAL TESTS:  Spurling's test: Positive  TODAY'S TREATMENT:                                                                                                                              DATE:  07/03/23 Nustep level 5 x 6 mintues 15# rows 15# lats 10# straight arm pulls Back to wall red tband ER Red tband horizontal abduction Back to wall weighted ball overhead lift STM to the neck, upper trap and into the rhomboids  07/01/23 UBE level 3 x 5 mintues 15# rows 2x10 Lats 15# 2x10 Red tband horizontal abduction Red tband ER STM to the upper traps, rhomboids and cervical area  06/11/23 Nustep level 5 x 6 minutes 15# rows 15# Lats Red tband ER 4# shrugs with upper trap and levator stretches 5# shoulder extension Reviewed posture and body mechanics for her trip and lifting baggage STM to the upper trap, rhomboids and into the neck  06/06/23 UBE L 2 x each 15# rows cues for posture 15# lats Red tband ER Red tband horizontal  abduction Shoulder abd 2lb 2x10 Shoulder Ext 5lb 2x10  STM to the upper traps, genlte stretch and occipital release  06/03/23 Nustep level 5 x 6 minutes 15# rows cues for posture 15# lats Red tband ER Red tband horizontal abduction Shrugs with upper trap and levator stretches Chin tucks DN to the upper traps STM to the upper traps, genlte stretch and occipital release  05/12/23 UBL L2x 3 min each Standing rows/ext red t-band 2x10 Sitting ER yellow t-band 2x10 STM to B upper traps with trigger point release Manual cervical traction with PROM and trigger point release in supine   PATIENT EDUCATION:  Education details: POC/HEP Person educated: Patient Education method: Explanation, Facilities manager, and Handouts Education comprehension: verbalized understanding  HOME EXERCISE PROGRAM: Access Code: [email or web address] URL: [email or web address] Date: 05/06/2023 Prepared by: Stacie Glaze  Exercises - Seated Shoulder Shrugs  - 1 x daily - 7 x weekly - 3 sets - 10 reps - 3 hold - Seated Scapular Retraction  - 2 x daily - 7 x weekly - 2 sets - 10 reps - 3 hold - Seated Upper Trapezius Stretch  - 1 x daily - 7 x weekly - 10 sets - 10 reps - 30 hold  ASSESSMENT:  CLINICAL IMPRESSION: Pt reports that she felt a little better after the last treatment, less pain and tension.  I continued with the gym activities and added a few exercises, continued with the STM.   She would benefit from continued PT to decrease pain and improve functional independence.  OBJECTIVE IMPAIRMENTS: decreased activity tolerance, decreased endurance, decreased ROM, decreased strength, increased fascial restrictions, impaired perceived functional ability, increased muscle spasms, impaired flexibility, impaired UE functional use, improper body mechanics, postural dysfunction, and pain.  REHAB POTENTIAL: Good  CLINICAL DECISION MAKING: Stable/uncomplicated  EVALUATION COMPLEXITY: Low   GOALS Goals  reviewed with patient? Yes  SHORT TERM GOALS: Target date: 05/20/23  Independent with initial HEP Goal status: met 06/03/23  LONG TERM GOALS: Target date: 08/06/23  Understand posture and body mechanics Goal status: met 06/11/23  2.  Decrease pain 50% Goal status: ongoing 06/11/23  3.  Increase cervical ROM 25% Goal status: ongoing 07/01/23  4.  Return to a gym setting safely Goal status: INITIAL  5.  Report numbness and tingling decreased 50% Goal status: ongoing 07/01/23   PLAN:  PT FREQUENCY: 1-2x/week  PT DURATION: 12 weeks  PLANNED INTERVENTIONS: Therapeutic exercises, Therapeutic activity, Neuromuscular re-education, Balance training, Gait training, Patient/Family education, Self Care, Joint mobilization, Dry Needling, Electrical stimulation, Spinal mobilization, Taping, Traction, Ultrasound, and Manual therapy  PLAN FOR NEXT SESSION:progress gym activities  Lovella Hardie W, PT 07/03/2023, 1:21 PM

## 2023-07-15 ENCOUNTER — Encounter: Payer: Medicare Other | Admitting: Physical Therapy

## 2023-07-16 ENCOUNTER — Encounter: Payer: Self-pay | Admitting: Physical Therapy

## 2023-07-16 ENCOUNTER — Ambulatory Visit: Payer: Medicare Other | Attending: Family Medicine | Admitting: Physical Therapy

## 2023-07-16 DIAGNOSIS — R252 Cramp and spasm: Secondary | ICD-10-CM

## 2023-07-16 DIAGNOSIS — M542 Cervicalgia: Secondary | ICD-10-CM | POA: Diagnosis present

## 2023-07-16 DIAGNOSIS — M6281 Muscle weakness (generalized): Secondary | ICD-10-CM | POA: Diagnosis present

## 2023-07-16 DIAGNOSIS — M546 Pain in thoracic spine: Secondary | ICD-10-CM

## 2023-07-16 NOTE — Therapy (Signed)
OUTPATIENT PHYSICAL THERAPY CERVICAL TRETMENT   Patient Name: Margaret [name] MRN: [id number] DOB:Nov 04, 1951, 72 y.o., female Today's Date: 07/16/2023  END OF SESSION:  PT End of Session - 07/16/23 1102     Visit Number 8    Date for PT Re-Evaluation 08/06/23    PT Start Time 1100    PT Stop Time 1145    PT Time Calculation (min) 45 min    Activity Tolerance Patient tolerated treatment well    Behavior During Therapy [facility] for tasks assessed/performed             Past Medical History:  Diagnosis Date   Allergy    Anemia    Colitis    Dyspareunia    Endometriosis    GERD (gastroesophageal reflux disease)    High cholesterol    Hx of abnormal Pap smear 1980's   Hypertension    Osteopenia    Ulcer    Urinary incontinence    Past Surgical History:  Procedure Laterality Date   ABDOMINAL HYSTERECTOMY  09/2008   TVH/BSO--Dr. Edward Jolly with TVT   BLADDER SUSPENSION  09/2008   Dr. 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   Back pain 04/12/2014   Arthritis 04/12/2014   Knee pain 04/12/2014   Essential hypertension 11/27/2013   Osteoarthritis of left knee 07/16/2012    PCP: Caffie Damme, MD  REFERRING PROVIDER: Sharolyn Douglas, MD  REFERRING DIAG: Cervical disc disorder  THERAPY DIAG:  Cervicalgia  Cramp and spasm  Muscle weakness (generalized)  Pain in thoracic  spine  Rationale for Evaluation and Treatment: Rehabilitation  ONSET DATE: 04/21/23  SUBJECTIVE:                                                                                                                                                                                                         SUBJECTIVE STATEMENT: "I feel better than I did yesterday" "I just got to get back into my routine"  Hand dominance: Right  PERTINENT HISTORY:  As above  PAIN:  Are you having pain? Yes: NPRS scale: 3/10 Pain location: neck and right arm Pain description: ache, tight, spasm, tingling in the right arm Aggravating factors: difficulty sleeping, lifting, housework  pain up to 7-8/10 Relieving factors: prednisone has really helped, cream, pain can be 2/10  PRECAUTIONS: None  WEIGHT BEARING RESTRICTIONS: No  FALLS:  Has patient fallen in last 6 months? No  LIVING ENVIRONMENT: Lives with: lives with their spouse Lives in: House/apartment Stairs: No Has following equipment at home: None  OCCUPATION: retired  PLOF: Independent and house work, walks dog 2x/day  PATIENT GOALS: less pain, less right arm issues  NEXT MD VISIT: none scheduled  OBJECTIVE:   DIAGNOSTIC FINDINGS:   IMPRESSION: 1. 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 Other than mild discogenic edema at C4-5, little change since 2021. 2. Mild foraminal and spinal canal narrowing at C4-5 and C5-6.  PATIENT SURVEYS:  FOTO 39  COGNITION: Overall cognitive status: Within functional limits for tasks assessed  SENSATION: WFL  POSTURE: rounded shoulders and forward head  PALPATION: She has tightness and tenderness in the upper traps, rhomboids and cervical paraspinals   CERVICAL ROM:   Active ROM A/PROM (deg) eval  Flexion 50%  Extension 75% limited  Right lateral flexion 50% limited  Left lateral flexion 50% limited  Right rotation Decreased 50%  Left rotation Decreased 25%   (Blank rows = not tested)  UPPER  EXTREMITY ROM:  Active ROM Right eval Left eval  Shoulder flexion 140 140  Shoulder extension    Shoulder abduction    Shoulder adduction    Shoulder extension    Shoulder internal rotation 60 60  Shoulder external rotation    Elbow flexion    Elbow extension    Wrist flexion    Wrist extension    Wrist ulnar deviation    Wrist radial deviation    Wrist pronation    Wrist supination     (Blank rows = not tested)  UPPER EXTREMITY MMT:  Grip strength 60#  MMT Right eval Left eval  Shoulder flexion 4- 4-  Shoulder extension    Shoulder abduction    Shoulder adduction    Shoulder extension    Shoulder internal rotation    Shoulder external rotation 4- 4-  Middle trapezius    Lower trapezius    Elbow flexion    Elbow extension    Wrist flexion    Wrist extension    Wrist ulnar deviation    Wrist radial deviation    Wrist pronation    Wrist supination    Grip strength     (Blank rows = not tested)  CERVICAL SPECIAL TESTS:  Spurling's test: Positive  TODAY'S TREATMENT:                                                                                                                              DATE:   07/16/23  UBE level 2.5 x 3 mintues 15# rows 15# lats 10# straight arm pulls Back to wall red tband ER Red tband horizontal abduction Seated overhead lifts  STM to the neck, upper trap and into the rhomboids  07/03/23 Nustep level 5 x 6 mintues 15# rows 15# lats 10# straight arm pulls Back to wall red tband ER Red tband horizontal abduction Back to wall weighted ball overhead lift STM to the neck, upper trap and into the rhomboids  07/01/23 UBE level 3 x 5 mintues 15# rows 2x10 Lats 15# 2x10 Red tband horizontal abduction Red tband ER STM to the upper traps, rhomboids and cervical area  06/11/23 Nustep level 5 x 6 minutes 15# rows 15# Lats Red tband ER 4# shrugs with upper trap and levator stretches 5# shoulder extension Reviewed posture and body  mechanics for her trip and lifting baggage STM to the upper trap, rhomboids and into the neck  06/06/23 UBE L 2 x each 15# rows cues for posture 15# lats Red tband ER Red tband horizontal abduction Shoulder abd 2lb 2x10 Shoulder Ext 5lb 2x10  STM to the upper traps, genlte stretch and occipital release  06/03/23 Nustep level 5 x 6 minutes 15# rows cues for posture 15# lats Red tband ER Red tband horizontal abduction Shrugs with upper trap and levator stretches Chin tucks DN to the upper traps STM to the upper traps, genlte stretch and occipital release  05/12/23 UBL L2x 3 min each Standing rows/ext red t-band 2x10 Sitting ER yellow t-band 2x10 STM to B upper traps with trigger point release Manual cervical traction with PROM and trigger point release in supine   PATIENT EDUCATION:  Education details: POC/HEP Person educated: Patient Education method: Explanation, Facilities manager, and Handouts Education comprehension: verbalized understanding  HOME EXERCISE PROGRAM: Access Code: [email or web address] URL: [email or web address] Date: 05/06/2023 Prepared by: Stacie Glaze  Exercises - Seated Shoulder Shrugs  - 1 x daily - 7 x weekly - 3 sets - 10 reps - 3 hold - Seated Scapular Retraction  - 2 x daily - 7 x weekly - 2 sets - 10 reps - 3 hold - Seated Upper Trapezius Stretch  - 1 x daily - 7 x weekly - 10 sets - 10 reps - 30 hold  ASSESSMENT:  CLINICAL IMPRESSION: Again pt enter with reports fo improvement. Pt stated she just needs to get back [facility] [employer] Continued with the gym activities STM. She reports that she is pleased with her current functional status and [facility] [employer]  OBJECTIVE IMPAIRMENTS: decreased activity tolerance, decreased endurance, decreased ROM, decreased strength, increased fascial restrictions, impaired perceived functional ability, increased muscle spasms, impaired flexibility, impaired UE functional use, improper body  mechanics, postural dysfunction, and pain.  REHAB POTENTIAL: Good  CLINICAL DECISION MAKING: Stable/uncomplicated  EVALUATION COMPLEXITY: Low   GOALS Goals reviewed with patient? Yes  SHORT TERM GOALS: Target date: 05/20/23  Independent with initial HEP Goal status: met 06/03/23  LONG TERM GOALS: Target date: 08/06/23  Understand posture and body mechanics Goal status: met 06/11/23  2.  Decrease pain 50% Goal status: ongoing 06/11/23  3.  Increase cervical ROM 25% Goal status: ongoing 07/01/23  4.  Return to a gym setting safely Goal status: Met 07/16/23  5.  Report numbness and tingling decreased 50% Goal status: ongoing 07/01/23   PLAN:  PT FREQUENCY: 1-2x/week  PT DURATION: 12 weeks  PLANNED INTERVENTIONS: Therapeutic exercises, Therapeutic activity, Neuromuscular re-education, Balance training, Gait training, Patient/Family  education, Self Care, Joint mobilization, Dry Needling, Electrical stimulation, Spinal mobilization, Taping, Traction, Ultrasound, and Manual therapy  PLAN FOR NEXT SESSION: D/C PT  PHYSICAL THERAPY DISCHARGE SUMMARY  Visits from Start of Care: 8   Patient agrees to discharge. Patient goals were partially met. Patient is being discharged due to being pleased with the current functional level.  Cassie Freer, DPT Grayce Sessions, PTA 07/16/2023, 11:03 AM

## 2023-07-17 ENCOUNTER — Ambulatory Visit
Admission: RE | Admit: 2023-07-17 | Discharge: 2023-07-17 | Disposition: A | Discharge status: Home / Self Care | Payer: Medicare Other | Source: Ambulatory Visit | Attending: Obstetrics and Gynecology | Admitting: Obstetrics and Gynecology

## 2023-07-17 DIAGNOSIS — Z0000 Encounter for general adult medical examination without abnormal findings: Secondary | ICD-10-CM

## 2023-08-05 ENCOUNTER — Telehealth: Payer: Self-pay | Admitting: Cardiology

## 2023-08-05 NOTE — Telephone Encounter (Signed)
Left message to call back  

## 2023-08-05 NOTE — Telephone Encounter (Signed)
   Pt c/o of Chest Pain: STAT if active CP, including tightness, pressure, jaw pain, radiating pain to shoulder/upper arm/back, CP unrelieved by Nitro. Symptoms reported of SOB, nausea, vomiting, sweating.  1. Are you having CP right now? Heaviness in her chest- not at this time    2. Are you experiencing any other symptoms (ex. SOB, nausea, vomiting, sweating)? A little shortness of breath-not at this time- it comes and goes   3. Is your CP continuous or coming and going? Comes and goes   4. Have you taken Nitroglycerin? no   5. How long have you been experiencing CP? Last 2 o3 days - patient wanted to be seen- I made her an appointment with her doctor, Dr Raelyn Ensign on 08-14-23   6. If NO CP at time of call then end call with telling Pt to call back or call 911 if Chest pain returns prior to return call from triage team.

## 2023-08-08 NOTE — Telephone Encounter (Signed)
Called the patient back.  She is feeling okay right now.  She can exercise without symptoms but left sided chest pain may come on afterward or just doing nothing.   She had a coronary CT angiogram last December w her [facility] doctor.     Reviewed ntg use and ER precautions.  Scheduled next week.  No other needs at this time.

## 2023-08-08 NOTE — Telephone Encounter (Signed)
Noted. Most likely noncardiac chest pain. Will discuss in office visit on 10/3.  Thanks MJP

## 2023-08-11 NOTE — Progress Notes (Unsigned)
72 y.o. Margaret [name] Single African American female here for annual exam.    Has some bladder urgency.  Manageable.  Does have some leakage with a laugh, cough, and it does not occur often.  Does not do Kegel's.  Does have some accidental leakage of stool. Has constipation.   Does not have a significant other.   PCP:  Dr. Caffie Damme   No LMP recorded. Patient has had a hysterectomy.           Sexually active: No.  The current method of family planning is status post hysterectomy.    Exercising: Yes.     Aerobics 3x a week, gym Smoker:  no  Health Maintenance: Pap:  years ago prior to hyst History of abnormal Pap:  yes, 1982 Hx of a conization of cervix  MMG:  07/17/23 Breast Density Cat B, BI-RADS CAT 1 neg Colonoscopy:  10/25/17- thinks end of 2022 or 2023 BMD:    08/02/2020- [facility]   Result  osteopenia TDaP:  PCP Gardasil:   no HIV: 06/11/17 NR Hep C: 06/11/17 neg Screening Labs:  PCP   reports that she has never smoked. She has never used smokeless tobacco. She reports current alcohol use. She reports that she does not use drugs.  Past Medical History:  Diagnosis Date   Allergy    Anemia    Colitis    Dyspareunia    Endometriosis    GERD (gastroesophageal reflux disease)    High cholesterol    Hx of abnormal Pap smear 1980's   Hypertension    Osteopenia    Ulcer    Urinary incontinence     Past Surgical History:  Procedure Laterality Date   ABDOMINAL HYSTERECTOMY  09/2008   TVH/BSO--Dr. Edward Jolly with TVT   BLADDER SUSPENSION  09/2008   Dr. Edward Jolly   CERVIX LESION DESTRUCTION  1980   hx abnormal pap--dysplasia   COLONOSCOPY WITH PROPOFOL Left 10/25/2017   Procedure: COLONOSCOPY WITH PROPOFOL;  Surgeon: Kerin Salen, MD;  Location: [facility];  Service: Gastroenterology;  Laterality: Left;    Current Outpatient Medications  Medication Sig Dispense Refill   Ascorbic Acid (VITAMIN C) 1000 MG tablet Take 1,000 mg by mouth daily.     atorvastatin (LIPITOR) 10  MG tablet Take 10 mg by mouth daily.     Cholecalciferol (VITAMIN D) 2000 UNITS CAPS Take 6,000 Units by mouth daily.     diclofenac Sodium (VOLTAREN) 1 % GEL Apply 4 g topically 4 (four) times daily. (Patient taking differently: Apply 4 g topically as needed.) 100 g 0   docusate sodium (COLACE) 100 MG capsule 1 capsule as needed     Ferrous Sulfate (IRON) 325 (65 Fe) MG TABS 1 tablet Orally Once a day     fluticasone (FLONASE SENSIMIST) 27.5 MCG/SPRAY nasal spray 2-4 sprays (1-2 sprays in each nostril) Nasally Once a day for 30 days     fluticasone (FLONASE) 50 MCG/ACT nasal spray Place 1 spray into both nostrils daily as needed for allergies or rhinitis.      ibuprofen (ADVIL) 600 MG tablet Take 1 tablet (600 mg total) by mouth every 6 (six) hours as needed for mild pain. 30 tablet 1   lisinopril-hydrochlorothiazide (ZESTORETIC) 10-12.5 MG tablet 1 tablet     Magnesium 250 MG TABS 1 tablet with a meal     Multiple Vitamins-Minerals (MULTIVITAMIN ADULT PO) Take 1 tablet by mouth daily.     Olopatadine HCl (PATADAY) 0.2 % SOLN 1 drop into affected  eye Ophthalmic Once a day     Omega-3 Fatty Acids (FISH OIL) 1000 MG CPDR Take by mouth.     omeprazole (PRILOSEC) 20 MG capsule 1 capsule 30 minutes before morning meal Orally Once a day for 90 days     nitroGLYCERIN (NITROSTAT) 0.4 MG SL tablet Place 1 tablet (0.4 mg total) under the tongue every 5 (five) minutes as needed for chest pain. 30 tablet 3   No current facility-administered medications for this visit.    Family History  Problem Relation Age of Onset   Diabetes Mother    Hypertension Mother    Thyroid disease Sister    Seizures Brother    Diabetes Brother    Hypertension Brother    Diabetes Brother    Arthritis Other    Diabetes Other    Breast cancer Cousin     Review of Systems  All other systems reviewed and are negative.   Exam:   BP 110/72 (BP Location: Left Arm, Patient Position: Sitting, Cuff Size: Normal)   Pulse  74   Ht 5' 6.5" (1.689 m)   Wt 169 lb (76.7 kg)   SpO2 97%   BMI 26.87 kg/m     General appearance: alert, cooperative and appears stated age Head: normocephalic, without obvious abnormality, atraumatic Neck: no adenopathy, supple, symmetrical, trachea midline and thyroid normal to inspection and palpation Lungs: clear to auscultation bilaterally Breasts: normal appearance, no masses or tenderness, No nipple retraction or dimpling, No nipple discharge or bleeding, No axillary adenopathy Heart: regular rate and rhythm Abdomen: soft, non-tender; no masses, no organomegaly Extremities: extremities normal, atraumatic, no cyanosis or edema Skin: skin color, texture, turgor normal. No rashes or lesions Lymph nodes: cervical, supraclavicular, and axillary nodes normal. Neurologic: grossly normal  Pelvic: External genitalia:  no lesions              No abnormal inguinal nodes palpated.              Urethra:  normal appearing urethra with no masses, tenderness or lesions              Bartholins and Skenes: normal                 Vagina: normal appearing vagina with normal color and discharge, no lesions              Cervix: absent              Pap taken: no Bimanual Exam:  Uterus:  absent              Adnexa: no mass, fullness, tenderness              Rectal exam: yes.  Confirms.              Anus:  decreased sphincter tone, no lesions  Chaperone was present for exam:  Warren Lacy, CMA  Assessment:   Encounter for breast and pelvic exam.  Other specified risk factors.  Status post TVH/BSO/TVT.  Status post anterior colporrhaphy with biological mesh.  Hx of vaginal atrophy.  Mixed incontinence.  Fecal incontinence.  Constipation.  Osteopenia.   Plan: Mammogram screening discussed. Self breast awareness reviewed. Pap and HR HPV not indicated.  Guidelines for Calcium, Vitamin D, regular exercise program including cardiovascular and weight bearing exercise. Metamucil for fecal  incontinence.  Referral for pelvic floor therapy.  BMD through PCP.  Follow up annually and prn.   25 min  total time  was spent for this patient encounter, including preparation, face-to-face counseling with the patient, coordination of care, and documentation of the encounter in addition to performing the breast and pelvic exam.

## 2023-08-13 ENCOUNTER — Emergency Department (HOSPITAL_BASED_OUTPATIENT_CLINIC_OR_DEPARTMENT_OTHER): Payer: Medicare Other

## 2023-08-13 ENCOUNTER — Encounter (HOSPITAL_BASED_OUTPATIENT_CLINIC_OR_DEPARTMENT_OTHER): Payer: Self-pay

## 2023-08-13 ENCOUNTER — Emergency Department (HOSPITAL_BASED_OUTPATIENT_CLINIC_OR_DEPARTMENT_OTHER)
Admission: EM | Admit: 2023-08-13 | Discharge: 2023-08-13 | Disposition: A | Discharge status: Home / Self Care | Payer: Medicare Other | Attending: Emergency Medicine | Admitting: Emergency Medicine

## 2023-08-13 ENCOUNTER — Other Ambulatory Visit: Payer: Self-pay

## 2023-08-13 DIAGNOSIS — R519 Headache, unspecified: Secondary | ICD-10-CM | POA: Insufficient documentation

## 2023-08-13 DIAGNOSIS — Z79899 Other long term (current) drug therapy: Secondary | ICD-10-CM | POA: Diagnosis not present

## 2023-08-13 DIAGNOSIS — H538 Other visual disturbances: Secondary | ICD-10-CM | POA: Insufficient documentation

## 2023-08-13 DIAGNOSIS — R079 Chest pain, unspecified: Secondary | ICD-10-CM

## 2023-08-13 DIAGNOSIS — I10 Essential (primary) hypertension: Secondary | ICD-10-CM | POA: Insufficient documentation

## 2023-08-13 DIAGNOSIS — H539 Unspecified visual disturbance: Secondary | ICD-10-CM

## 2023-08-13 DIAGNOSIS — R0789 Other chest pain: Secondary | ICD-10-CM | POA: Insufficient documentation

## 2023-08-13 LAB — CBC
HCT: 35.8 % — ABNORMAL LOW (ref 36.0–46.0)
Hemoglobin: 11.2 g/dL — ABNORMAL LOW (ref 12.0–15.0)
MCH: 26.7 pg (ref 26.0–34.0)
MCHC: 31.3 g/dL (ref 30.0–36.0)
MCV: 85.4 fL (ref 80.0–100.0)
Platelets: 252 10*3/uL (ref 150–400)
RBC: 4.19 MIL/uL (ref 3.87–5.11)
RDW: 15.4 % (ref 11.5–15.5)
WBC: 4.1 10*3/uL (ref 4.0–10.5)
nRBC: 0 % (ref 0.0–0.2)

## 2023-08-13 LAB — BASIC METABOLIC PANEL
Anion gap: 10 (ref 5–15)
BUN: 13 mg/dL (ref 8–23)
CO2: 28 mmol/L (ref 22–32)
Calcium: 9.1 mg/dL (ref 8.9–10.3)
Chloride: 103 mmol/L (ref 98–111)
Creatinine, Ser: 0.68 mg/dL (ref 0.44–1.00)
GFR, Estimated: >60 mL/min (ref >60)
Glucose, Bld: 106 mg/dL — ABNORMAL HIGH (ref 70–99)
Potassium: 3.7 mmol/L (ref 3.5–5.1)
Sodium: 141 mmol/L (ref 135–145)

## 2023-08-13 LAB — TROPONIN I (HIGH SENSITIVITY): Troponin I (High Sensitivity): 4 ng/L (ref <18)

## 2023-08-13 MED ORDER — ACETAMINOPHEN 500 MG PO TABS
1000.0000 mg | ORAL_TABLET | ORAL | Status: AC
  Administered 2023-08-13: 1000 mg via ORAL
  Filled 2023-08-13: qty 2

## 2023-08-13 NOTE — ED Notes (Signed)
Visual acuity completed per order. Pt wears glasses and says that her vision in her right eye is very blurry.

## 2023-08-13 NOTE — ED Provider Notes (Signed)
[name] Provider Note   CSN: [id number] Arrival date & time: 08/13/23  7829     History  Chief Complaint  Patient presents with   Headache    Margaret [name] is a 72 y.o. female.  72 year old female with history of hypertension and hyperlipidemia presents emergency department with blurry vision.  Patient reports that she woke up this morning and says that she had a mild headache.  Noticed that her vision appeared blurry.  Put on her glasses and started walking around and the vision improved.  Took her blood pressure and it was 150 systolic and was concerned so decided to come into the emergency department for evaluation.  Says that she had some tingling of her right arm.  No numbness or weakness otherwise.  Also has been having some substernal chest discomfort over the past few weeks and thinks that it may be due to a pinched nerve.  No changes in her chest discomfort today. Also was having left shoulder pain that is worsened with movement but is not exertional.       Home Medications Prior to Admission medications   Medication Sig Start Date End Date Taking? Authorizing Provider  Ascorbic Acid (VITAMIN C) 1000 MG tablet Take 1,000 mg by mouth daily.    [provider]  atorvastatin (LIPITOR) 10 MG tablet Take 10 mg by mouth daily.    [provider]  cetirizine (ZYRTEC) 10 MG tablet 1 tablet    [provider]  Cholecalciferol (VITAMIN D) 2000 UNITS CAPS Take 6,000 Units by mouth daily.    [provider]  cyclobenzaprine (FLEXERIL) 10 MG tablet Take 1 tablet (10 mg total) by mouth 2 (two) times daily as needed for muscle spasms. 01/02/23   Rondel Baton, MD  diclofenac Sodium (VOLTAREN) 1 % GEL Apply 4 g topically 4 (four) times daily. 08/16/22   Melene Plan, DO  docusate sodium (COLACE) 100 MG capsule 1 capsule as needed    [provider]  fluticasone (FLONASE) 50 MCG/ACT  nasal spray Place 1 spray into both nostrils daily as needed for allergies or rhinitis.     [provider]  ibuprofen (ADVIL) 600 MG tablet Take 1 tablet (600 mg total) by mouth every 6 (six) hours as needed for mild pain. 06/27/22   Patton Salles, MD  lisinopril-hydrochlorothiazide (ZESTORETIC) 10-12.5 MG tablet 1 tablet    [provider]  Magnesium 250 MG TABS 1 tablet with a meal    [provider]  metoprolol tartrate (LOPRESSOR) 50 MG tablet Take tablet (50mg ) TWO hours prior to your cardiac CT scan. 10/16/22   O'NealRonnald Ramp, MD  Multiple Vitamins-Minerals (MULTIVITAMIN ADULT PO) Take 1 tablet by mouth daily.    [provider]  nitroGLYCERIN (NITROSTAT) 0.4 MG SL tablet Place 1 tablet (0.4 mg total) under the tongue every 5 (five) minutes as needed for chest pain. 05/17/22 08/15/22  Patwardhan, Anabel Bene, MD  Omega-3 Fatty Acids (FISH OIL) 1000 MG CPDR Take by mouth.    [provider]      Allergies    Amoxicillin-pot clavulanate, Ciprofloxacin, Levofloxacin, Levonorgestrel-ethinyl estrad, Macrobid [nitrofurantoin], Pantoprazole sodium, and Sulfa antibiotics    Review of Systems   Review of Systems  Physical Exam Updated Vital Signs BP 139/81   Pulse 67   Temp 98.4 F (36.9 C)   Resp (!) 9   SpO2 97%  Physical Exam Vitals and nursing note  reviewed.  Constitutional:      General: She is not in acute distress.    Appearance: She is well-developed.  HENT:     Head: Normocephalic and atraumatic.     Comments: No temporal artery tenderness to palpation    Right Ear: External ear normal.     Left Ear: External ear normal.     Nose: Nose normal.  Eyes:     Extraocular Movements: Extraocular movements intact.     Conjunctiva/sclera: Conjunctivae normal.     Pupils: Pupils are equal, round, and reactive to light.     Comments: Bilateral Distance: 20/32 R Distance: 20/50 L Distance: 20/32. Patient states that this is her  baseline and that her R eye is typically weaker than her left.   Cardiovascular:     Rate and Rhythm: Normal rate and regular rhythm.     Heart sounds: No murmur heard.    Comments: Chest pain not reproducible Pulmonary:     Effort: Pulmonary effort is normal. No respiratory distress.     Breath sounds: Normal breath sounds.  Musculoskeletal:     Cervical back: Normal range of motion and neck supple.     Right lower leg: No edema.     Left lower leg: No edema.  Skin:    General: Skin is warm and dry.  Neurological:     Mental Status: She is alert and oriented to person, place, and time. Mental status is at baseline.     Comments: MENTAL STATUS: AAOx3 CRANIAL NERVES: II: Pupils equal and reactive 4 mm BL, no RAPD, no VF deficits III, IV, VI: EOM intact, no gaze preference or deviation, no nystagmus. V: normal sensation to light touch in V1, V2, and V3 segments bilaterally VII: no facial weakness or asymmetry, no nasolabial fold flattening VIII: normal hearing to speech and finger friction IX, X: normal palatal elevation, no uvular deviation XI: 5/5 head turn and 5/5 shoulder shrug bilaterally XII: midline tongue protrusion MOTOR: 5/5 strength in R shoulder flexion, elbow flexion and extension, and grip strength. 5/5 strength in L shoulder flexion, elbow flexion and extension, and grip strength.  5/5 strength in R hip and knee flexion, knee extension, ankle plantar and dorsiflexion. 5/5 strength in L hip and knee flexion, knee extension, ankle plantar and dorsiflexion. SENSORY: Normal sensation to light touch in all extremities COORD: Normal finger to nose and heel to shin, no tremor, no dysmetria STATION: No truncal ataxia   Psychiatric:        Mood and Affect: Mood normal.     ED Results / Procedures / Treatments   Labs (all labs ordered are listed, but only abnormal results are displayed) Labs Reviewed  BASIC METABOLIC PANEL - Abnormal; Notable for the following components:       Result Value   Glucose, Bld 106 (*)    All other components within normal limits  CBC - Abnormal; Notable for the following components:   Hemoglobin 11.2 (*)    HCT 35.8 (*)    All other components within normal limits  TROPONIN I (HIGH SENSITIVITY)    EKG EKG Interpretation Date/Time:  Wednesday August 13 2023 08:33:05 EDT Ventricular Rate:  75 PR Interval:  188 QRS Duration:  92 QT Interval:  410 QTC Calculation: 458 R Axis:   62  Text Interpretation: Sinus rhythm Confirmed by Vonita Moss [phone]) on 08/13/2023 8:45:50 AM  Radiology DG Chest 2 View  Result Date: 08/13/2023 CLINICAL DATA:  Chest pain EXAM: CHEST -  2 VIEW COMPARISON:  X-ray 04/23/2023 FINDINGS: The heart size and mediastinal contours are within normal limits. No consolidation, pneumothorax or effusion. Normal cardiopericardial silhouette without edema. Overlapping cardiac leads. Degenerative changes of the spine. Slight pectus excavatum. IMPRESSION: No acute cardiopulmonary disease. Electronically Signed   By: Karen Kays M.D.   On: 08/13/2023 10:38   CT Head Wo Contrast  Result Date: 08/13/2023 CLINICAL DATA:  Transient vision changes EXAM: CT HEAD WITHOUT CONTRAST TECHNIQUE: Contiguous axial images were obtained from the base of the skull through the vertex without intravenous contrast. RADIATION DOSE REDUCTION: This exam was performed according to the departmental dose-optimization program which includes automated exposure control, adjustment of the mA and/or kV according to patient size and/or use of iterative reconstruction technique. COMPARISON:  None Available. FINDINGS: Brain: No evidence of acute infarction, hemorrhage, hydrocephalus, extra-axial collection or mass lesion/mass effect. Vascular: No hyperdense vessel or unexpected calcification. Skull: Normal. Negative for fracture or focal lesion. Sinuses/Orbits: No acute finding. IMPRESSION: Negative head CT. Electronically Signed   By: Tiburcio Pea M.D.   On: 08/13/2023 10:21    Procedures Procedures    Medications Ordered in ED Medications  acetaminophen (TYLENOL) tablet 1,000 mg (1,000 mg Oral Given 08/13/23 1109)    ED Course/ Medical Decision Making/ A&P                                 Medical Decision Making Amount and/or Complexity of Data Reviewed Labs: ordered. Radiology: ordered.  Risk OTC drugs.   Margaret [name] is a 72 y.o. female with comorbidities that complicate the patient evaluation including hypertension and hyperlipidemia who presents emergency department with blurry vision mild headache, and chest discomfort  Initial Ddx:  Migraine, hypertensive emergency, stroke, ICH, temporal arteritis, MI  MDM/Course:  Patient presents to the emergency department with blurry vision, mild headache, and chest discomfort.  Has a nonfocal neurologic exam.  Blood pressure is not severely elevated.  Patient appears to be at baseline at this time.  No visual field cuts.  EKG and troponins without signs of acute MI.  Suspect noncardiac cause of her chest discomfort.  Has already been told that it may be due to nerve or musculoskeletal issues.  Did have a head CT that did not show ICH.  Chest x-ray without acute abnormalities.  Upon re-evaluation patient reports no additional neurologic symptoms.  Feel that she likely has a migraine that is causing her headache.  Of note, did consider temporal arteritis given her symptoms but she is not having any jaw claudication or symptoms of polymyalgia rheumatica or objective visual deficits feel this is less likely.  Also does not have any temporal artery tenderness palpation on exam.  Will have her follow-up with her primary doctor for continued evaluation of her symptoms.  This patient presents to the ED for concern of complaints listed in HPI, this involves an extensive number of treatment options, and is a complaint that carries with it a high risk of complications and  morbidity. Disposition including potential need for admission considered.   Dispo: DC Home. Return precautions discussed including, but not limited to, those listed in the AVS. Allowed pt time to ask questions which were answered fully prior to dc.  Records reviewed Outpatient Clinic Notes and ED Visit Notes The following labs were independently interpreted: Chemistry and show no acute abnormality I independently reviewed the following imaging with scope of interpretation limited to  determining acute life threatening conditions related to emergency care: CT Head and agree with the radiologist interpretation with the following exceptions: none I personally reviewed and interpreted cardiac monitoring: normal sinus rhythm  I personally reviewed and interpreted the pt's EKG: see above for interpretation  I have reviewed the patients home medications and made adjustments as needed Social Determinants of health:  Elderly  Portions of this note were generated with Scientist, clinical (histocompatibility and immunogenetics). Dictation errors may occur despite best attempts at proofreading.           Final Clinical Impression(s) / ED Diagnoses Final diagnoses:  Vision changes  Chest pain, unspecified type    Rx / DC Orders ED Discharge Orders     None         Rondel Baton, MD 08/13/23 1943

## 2023-08-13 NOTE — Discharge Instructions (Signed)
You were seen for your vision changes and chest pain in the emergency department.   At home, please take tylenol for any pain.    Check your MyChart online for the results of any tests that had not resulted by the time you left the emergency department.   Follow-up with your primary doctor in 2-3 days regarding your visit.  Follow-up with your ophthalmologist about your vision changes.   Return immediately to the emergency department if you experience any of the following: persistent vision changes, worsening chest pain, or any other concerning symptoms.    Thank you for visiting our Emergency Department. It was a pleasure taking care of you today.

## 2023-08-13 NOTE — Progress Notes (Unsigned)
  Cardiology Office Note:  .   Date:  08/13/2023  ID:  [name], DOB [birth year], MRN [id number] PCP: Caffie Damme, MD  [facility] Providers Cardiologist:  Truett Mainland, MD PCP: Caffie Damme, MD  No chief complaint on file.     History of Present Illness: .   Margaret [name] is a 72 y.o. female with controlled hypertension, atypical chest pain.   Patient was seen in emergency room on 08/13/2023 with complaints of headache and blurry vision.  Previous workup calcium score 0 with minimal disease in proximal LAD on current CT angiogram and 08/2022.  There were no vitals filed for this visit.   ROS: *** ROS   Studies Reviewed: Marland Kitchen        Labs 08/13/2023: No significant abnormalities Normal trop HS  Coronary CTA 08/2022: 1. Coronary calcium score of 0. 2. Normal coronary origin with right dominance. 3. 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 Mild non-obstructive CAD (25-49%). Consider non-atherosclerotic causes of chest pain. Consider preventive therapy and risk factor modification.   Physical Exam:   Physical Exam   VISIT DIAGNOSES: No diagnosis found.   ASSESSMENT AND PLAN: .    Margaret [name] is a 72 y.o. female with controlled hypertension, atypical chest pain.   ***  {Are you ordering a CV Procedure (e.g. stress test, cath, DCCV, TEE, etc)?   Press F2        :[id number]}    No orders of the defined types were placed in this encounter.    F/u in ***  Signed, Elder Negus, MD

## 2023-08-13 NOTE — ED Triage Notes (Addendum)
Pt presents with complaint of 5 minute episodes of blurred vision at 7am.  SOB for 2 weeks. Pulmonary function performed last week. Slight chest pain, HA present when she woke up thought it was due to her braids , with left neck muscle strain and see scoliosis specialist

## 2023-08-13 NOTE — ED Notes (Signed)
Patient back from CT and X-ray 09:58am. Candy [place], RN notified.

## 2023-08-13 NOTE — ED Notes (Signed)
Patient transported to CT 

## 2023-08-14 ENCOUNTER — Encounter: Payer: Self-pay | Admitting: Cardiology

## 2023-08-14 ENCOUNTER — Ambulatory Visit: Payer: Medicare Other | Attending: Cardiology | Admitting: Cardiology

## 2023-08-14 VITALS — BP 130/76 | HR 73 | Ht 66.5 in | Wt 166.0 lb

## 2023-08-14 DIAGNOSIS — I10 Essential (primary) hypertension: Secondary | ICD-10-CM | POA: Diagnosis not present

## 2023-08-14 NOTE — Patient Instructions (Signed)
Medication Instructions:  Your physician recommends that you continue on your current medications as directed. Please refer to the Current Medication list given to you today.  *If you need a refill on your cardiac medications before your next appointment, please call your pharmacy*   Lab Work: None.  If you have labs (blood work) drawn today and your tests are completely normal, you will receive your results only by: MyChart Message (if you have MyChart) OR A paper copy in the mail If you have any lab test that is abnormal or we need to change your treatment, we will call you to review the results.   Testing/Procedures: None.   Follow-Up: you can do with MyChart, go to [email or web address].    Your next appointment will be as needed and it will be with:     Provider:   Dr. Truett Mainland   Other Instructions Diet & Lifestyle recommendations:  Physical activity recommendation (The Physical Activity Guidelines for Americans. JAMA 2018;Nov 12) At least 150-300 minutes a week of moderate-intensity, or 75-150 minutes a week of vigorous-intensity aerobic physical activity, or an equivalent combination of moderate- and vigorous-intensity aerobic activity. Adults should perform muscle-strengthening activities on 2 or more days a week. Older adults should do multicomponent physical activity that includes balance training as well as aerobic and muscle-strengthening activities. Benefits of increased physical activity include lower risk of mortality including cardiovascular mortality, lower risk of cardiovascular events and associated risk factors (hypertension and diabetes), and lower risk of many cancers (including bladder, breast, colon, endometrium, esophagus, kidney, lung, and stomach). Additional improvments have been seen in cognition, risk of dementia, anxiety and depression, improved bone health, lower risk of falls, and associated injuries.  Dietary recommendation The 2019  ACC/AHA guidelines promote nutrition as a main fixture of cardiovascular wellness, with a recommendation for a varied diet of fruit, vegetables, fish, legumes, and whole grains (Class I), as well as recommendations to reduce sodium, cholesterol, processed meats, and refined sugars (Class IIa recommendation).10 Sodium intake, a topic of some controversy as of late, is recommended to be kept at 1,500 mg/day or less, far below the average daily intake in the [place] of 3,409 mg/day, and notably below that of previous [place] recommendations for 300mg /day.10,11 For those unable to reach 1,500 mg/day, they recommend at least a reduction of 1000 mg/day.  A Pesco-Mediterranean Diet With Intermittent Fasting: JACC Review Topic of the Week. J Am Coll Cardiol 2020;76:1484-1493 Pesco-Mediterranean diet, it is supplemented with extra-virgin olive oil (EVOO), which is the principle fat source, along with moderate amounts of dairy (particularly yogurt and cheese) and eggs, as well as modest amounts of alcohol consumption (ideally red wine with the evening meal), but few red and processed meats.

## 2023-08-25 ENCOUNTER — Encounter: Payer: Self-pay | Admitting: Obstetrics and Gynecology

## 2023-08-25 ENCOUNTER — Ambulatory Visit (INDEPENDENT_AMBULATORY_CARE_PROVIDER_SITE_OTHER): Payer: Medicare Other | Admitting: Obstetrics and Gynecology

## 2023-08-25 ENCOUNTER — Telehealth: Payer: Self-pay | Admitting: Obstetrics and Gynecology

## 2023-08-25 VITALS — BP 110/72 | HR 74 | Ht 66.5 in | Wt 169.0 lb

## 2023-08-25 DIAGNOSIS — N3946 Mixed incontinence: Secondary | ICD-10-CM

## 2023-08-25 DIAGNOSIS — R159 Full incontinence of feces: Secondary | ICD-10-CM

## 2023-08-25 DIAGNOSIS — Z01419 Encounter for gynecological examination (general) (routine) without abnormal findings: Secondary | ICD-10-CM

## 2023-08-25 DIAGNOSIS — Z90710 Acquired absence of both cervix and uterus: Secondary | ICD-10-CM

## 2023-08-25 DIAGNOSIS — Z9189 Other specified personal risk factors, not elsewhere classified: Secondary | ICD-10-CM | POA: Diagnosis not present

## 2023-08-25 NOTE — Telephone Encounter (Signed)
Please make a referral to [facility] pelvic floor therapy for mixed incontinence and fecal incontinence.

## 2023-08-25 NOTE — Patient Instructions (Signed)

## 2023-08-26 NOTE — Telephone Encounter (Signed)
Referral placed.   Routing to [employer].   Encounter closed.

## 2023-09-09 ENCOUNTER — Other Ambulatory Visit: Payer: Self-pay

## 2023-09-09 ENCOUNTER — Encounter: Payer: Self-pay | Admitting: Physical Therapy

## 2023-09-09 ENCOUNTER — Ambulatory Visit: Payer: Medicare Other | Attending: Obstetrics and Gynecology | Admitting: Physical Therapy

## 2023-09-09 DIAGNOSIS — R252 Cramp and spasm: Secondary | ICD-10-CM | POA: Diagnosis present

## 2023-09-09 DIAGNOSIS — N3946 Mixed incontinence: Secondary | ICD-10-CM | POA: Insufficient documentation

## 2023-09-09 DIAGNOSIS — M6281 Muscle weakness (generalized): Secondary | ICD-10-CM | POA: Diagnosis present

## 2023-09-09 DIAGNOSIS — R293 Abnormal posture: Secondary | ICD-10-CM | POA: Diagnosis present

## 2023-09-09 NOTE — Therapy (Signed)
OUTPATIENT PHYSICAL THERAPY FEMALE PELVIC EVALUATION   Patient Name: Margaret [name] MRN: [id number] DOB:05/12/1951, 72 y.o., female Today's Date: 09/09/2023  END OF SESSION:  PT End of Session - 09/09/23 1923     Visit Number 1    PT Start Time 1530    PT Stop Time 1620    PT Time Calculation (min) 50 min    Activity Tolerance Patient tolerated treatment well    Behavior During Therapy [facility] for tasks assessed/performed             Past Medical History:  Diagnosis Date   Allergy    Anemia    Colitis    Dyspareunia    Endometriosis    GERD (gastroesophageal reflux disease)    High cholesterol    Hx of abnormal Pap smear 1980's   Hypertension    Osteopenia    Ulcer    Urinary incontinence    Past Surgical History:  Procedure Laterality Date   ABDOMINAL HYSTERECTOMY  09/2008   TVH/BSO--Dr. Edward Jolly with TVT   BLADDER SUSPENSION  09/2008   Dr. 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   Back pain 04/12/2014   Arthritis 04/12/2014   Knee pain 04/12/2014   Essential hypertension 11/27/2013   Osteoarthritis of left knee 07/16/2012    PCP: Caffie Damme, MD PCP - General  REFERRING PROVIDER: Patton Salles, MD Ref Provider  REFERRING DIAG: N39.46 (ICD-10-CM) - Mixed incontinence  And fecal incontinence THERAPY DIAG:  Muscle weakness  (generalized)  Cramp and spasm  Abnormal posture  Rationale for Evaluation and Treatment: Rehabilitation  ONSET DATE: 1- 2 years  SUBJECTIVE:                                                                                                                                                                                           SUBJECTIVE STATEMENT: Pt reports that her main concern was her fecal incontinence, she has 3 children, she is 72 years old. She reports that she had an infection in her episiotomy between  her anus and her vaginal area. She had a surgery. She believes that area has always been kind of weak. She knows she has to change her diet, she gets constipated if she does not eat enough vegetables.   Fluid intake: No reports that she needs more  PAIN:  Are you having pain? Yes back, sciatica left side NPRS scale: 6-7/10 if she lifts something improperly Pain location:  no  Pain type: aching Pain description: intermittent   Aggravating factors: lifting something Relieving factors: rest  PRECAUTIONS: None  RED FLAGS: None   WEIGHT BEARING RESTRICTIONS: No  FALLS:  Has patient fallen in last 6 months? No  LIVING ENVIRONMENT: Lives with: lives alone Lives in: House/apartment Stairs: no Has following equipment at home: None  OCCUPATION: retired  PLOF: Independent  PATIENT GOALS: strengthen that area  PERTINENT HISTORY:  Hysterectomy, bladder suspension Sexual abuse: No  BOWEL MOVEMENT: Pain with bowel movement: No Type of bowel movement:Type (Bristol Stool Scale) 1-2 Fully empty rectum: No unless she eats properly Leakage: Yes:   Pads: No - sometimes Fiber supplement: No she reports that she should  URINATION: Pain with urination: No Fully empty bladder: Yes: usually Stream: Weak Urgency: No Frequency: fair amount Leakage: Urge to void, Walking to the bathroom, Coughing, Sneezing, and Laughing Pads: No sometimes   PREGNANCY: Vaginal  deliveries 3 Tearing Yes:   C-section deliveries 0 Currently pregnant No  PROLAPSE: None   OBJECTIVE:  Note: Objective measures were completed at Evaluation unless otherwise noted.     COGNITION: Overall cognitive status: Within functional limits for tasks assessed     SENSATION: Light touch: Appears intact Proprioception: Appears intact  MUSCLE LENGTH: Hamstrings: Right 80 deg; Left 80 deg   LUMBAR SPECIAL TESTS:  Long sit test: Positive     POSTURE: rounded shoulders and forward head  PELVIC ALIGNMENT: even  LUMBARAROM/PROM:  A/PROM A/PROM  eval  Flexion limited  Extension   Right lateral flexion   Left lateral flexion   Right rotation   Left rotation    (Blank rows = not tested)  LOWER EXTREMITY ROM:  Passive ROM Right eval Left eval  Hip flexion    Hip extension    Hip abduction    Hip adduction    Hip internal rotation    Hip external rotation    Knee flexion    Knee extension    Ankle dorsiflexion    Ankle plantarflexion    Ankle inversion    Ankle eversion     (Blank rows = not tested)  LOWER EXTREMITY MMT: grossly within functional limitations    PALPATION:   General  upper chest breathing Tenderness abdominal scars Restrictions throughout abdomen                 External Perineal Exam -within functional limitations                              Internal Pelvic Floor decreased tone throughout  Patient confirms identification and approves PT to assess internal pelvic floor and treatment Yes  PELVIC MMT:   MMT eval  Vaginal 3/5  Internal Anal Sphincter 3/5  External Anal Sphincter 3/5  Puborectalis 2/5  Diastasis Recti no  (Blank rows = not tested)        TONE: Anterior and posterior wall laxity, restrictions throughout perineal scar  PROLAPSE: Yes- bladder- checked in hooklying  TODAY'S TREATMENT:  DATE: 09/09/23   EVAL see above   PATIENT EDUCATION:  Education details: relevant anatomy, exam findings, abdominal massage, exercises Person educated: Patient Education method: Explanation, Tactile cues, Verbal cues, and Handouts Education comprehension: verbalized understanding, tactile cues required, and needs further education  HOME EXERCISE PROGRAM: [id number]   ASSESSMENT:  CLINICAL IMPRESSION: Patient is a 72 y.o. F who was seen today for physical therapy evaluation and treatment for fecal and urinary incontinence. Fecal is her main concern. She presents with weakness and low tone throughout her pelvic floor, upper chest breathing and restrictions throughout her abdomen. She had difficulty coordinating breath and pelvic floor today and will benefit from PT to reduce risk of accidents and improve QOL.  OBJECTIVE IMPAIRMENTS: decreased activity tolerance, decreased coordination, decreased mobility, decreased ROM, decreased strength, increased fascial restrictions, increased muscle spasms, impaired tone, improper body mechanics, and pain.   ACTIVITY LIMITATIONS: continence and toileting  PARTICIPATION LIMITATIONS: community activity and church  PERSONAL FACTORS: Age and Time since onset of injury/illness/exacerbation are also affecting patient's functional outcome.   REHAB POTENTIAL: Good  CLINICAL DECISION MAKING: Evolving/moderate complexity  EVALUATION COMPLEXITY: Moderate   GOALS: Goals reviewed with patient? Yes  SHORT TERM GOALS: Target date: 10/07/2023    Pt will be I with abdominal massage and use squatty potty to reduce constipation Baseline: Goal status: INITIAL  2.  Pt will be I with initial HEP and dem all exercises correctly Baseline:  Goal status: INITIAL  3.  Pt will report reducing fecal incontinence to mas 3/week Baseline:  Goal status: INITIAL    LONG TERM GOALS: Target date: 11/04/2023    Pt will report 0 fecal accidents/  week Baseline:  Goal status: INITIAL  2.  Pt will soak 0 pads/ week Baseline:  Goal status: INITIAL  3.  Pt will report consistency with HEP Baseline:  Goal status: INITIAL  4.  Pt will dem at least 4/5 pelvic floor strength Baseline:  Goal status: INITIAL    PLAN:  PT FREQUENCY: 1-2x/week  PT DURATION: 8 weeks  PLANNED INTERVENTIONS: 97110-Therapeutic exercises, 97530- Therapeutic activity, 97112- Neuromuscular re-education, 97535- Self Care, [id number]- Manual therapy, Dry Needling, Joint manipulation, Spinal manipulation, Spinal mobilization, and Scar mobilization  PLAN FOR NEXT SESSION: cont core strengthening   Antha Niday, PT 09/09/2023, 7:24 PM

## 2023-09-18 ENCOUNTER — Ambulatory Visit: Payer: Medicare Other | Admitting: Physical Therapy

## 2023-09-23 ENCOUNTER — Ambulatory Visit: Payer: Medicare Other | Attending: Obstetrics and Gynecology | Admitting: Physical Therapy

## 2023-09-23 DIAGNOSIS — R252 Cramp and spasm: Secondary | ICD-10-CM | POA: Diagnosis present

## 2023-09-23 DIAGNOSIS — R293 Abnormal posture: Secondary | ICD-10-CM | POA: Diagnosis present

## 2023-09-23 DIAGNOSIS — M546 Pain in thoracic spine: Secondary | ICD-10-CM | POA: Diagnosis present

## 2023-09-23 DIAGNOSIS — R1310 Dysphagia, unspecified: Secondary | ICD-10-CM | POA: Diagnosis present

## 2023-09-23 DIAGNOSIS — M6281 Muscle weakness (generalized): Secondary | ICD-10-CM | POA: Insufficient documentation

## 2023-09-23 DIAGNOSIS — M542 Cervicalgia: Secondary | ICD-10-CM | POA: Diagnosis present

## 2023-09-23 NOTE — Therapy (Signed)
OUTPATIENT PHYSICAL THERAPY FEMALE PELVIC EVALUATION   Patient Name: Margaret [name] MRN: [id number] DOB:06/26/1951, 72 y.o., female Today's Date: 09/23/2023  END OF SESSION:  PT End of Session - 09/23/23 1013     Visit Number 2    PT Start Time 0930    PT Stop Time 1015    PT Time Calculation (min) 45 min    Activity Tolerance Patient tolerated treatment well    Behavior During Therapy [facility] for tasks assessed/performed              Past Medical History:  Diagnosis Date   Allergy    Anemia    Colitis    Dyspareunia    Endometriosis    GERD (gastroesophageal reflux disease)    High cholesterol    Hx of abnormal Pap smear 1980's   Hypertension    Osteopenia    Ulcer    Urinary incontinence    Past Surgical History:  Procedure Laterality Date   ABDOMINAL HYSTERECTOMY  09/2008   TVH/BSO--Dr. Edward Jolly with TVT   BLADDER SUSPENSION  09/2008   Dr. 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   Back pain 04/12/2014   Arthritis 04/12/2014   Knee pain 04/12/2014   Essential hypertension 11/27/2013   Osteoarthritis of left knee 07/16/2012    PCP: Caffie Damme, MD PCP - General  REFERRING PROVIDER: Patton Salles, MD Ref Provider  REFERRING DIAG: N39.46 (ICD-10-CM) - Mixed incontinence  And fecal incontinence THERAPY DIAG:  Muscle weakness  (generalized)  Cramp and spasm  Abnormal posture  Dysphagia, unspecified type  Pain in thoracic spine  Cervicalgia  Rationale for Evaluation and Treatment: Rehabilitation  ONSET DATE: 1- 2 years  SUBJECTIVE:                                                                                                                                                                                           SUBJECTIVE STATEMENT: Pt reports that she has been busy, did not do her exercises like she was busy.  Fecal  incontinence has been pretty good, she knows a lot of it is her diet, lately has not been wanting to eat so much d/t working in the yard She reports she overworked her back, it's better now. Sometimes she gets sciatica left LOWER EXTREMITY, broke foot right side a while ago.  Reports that she needs to strengthen her lower abdomen.     Pt reports that her main concern was her fecal incontinence, she has 3 children, she is 36 years old. She reports that she had an infection in her episiotomy between her anus and her vaginal area. She had a surgery. She believes that area has always been kind of weak. She knows she has to change her diet, she gets constipated if she does not eat enough vegetables.   Fluid intake: No reports that she needs more  PAIN:  Are you having pain? Yes back, sciatica left side NPRS scale: 6-7/10 if she lifts something improperly Pain location:  no  Pain type: aching Pain description: intermittent   Aggravating factors: lifting something Relieving factors: rest  PRECAUTIONS: None  RED FLAGS: None   WEIGHT BEARING RESTRICTIONS: No  FALLS:  Has patient fallen in last 6 months? No  LIVING ENVIRONMENT: Lives with: lives alone Lives in: House/apartment Stairs: no Has following equipment at home: None  OCCUPATION: retired  PLOF: Independent  PATIENT GOALS: strengthen that area  PERTINENT HISTORY:  Hysterectomy, bladder suspension Sexual abuse:  No  BOWEL MOVEMENT: Pain with bowel movement: No Type of bowel movement:Type (Bristol Stool Scale) 1-2 Fully empty rectum: No unless she eats properly Leakage: Yes:   Pads: No - sometimes Fiber supplement: No she reports that she should  URINATION: Pain with urination: No Fully empty bladder: Yes: usually Stream: Weak Urgency: No Frequency: fair amount Leakage: Urge to void, Walking to the bathroom, Coughing, Sneezing, and Laughing Pads: No sometimes   PREGNANCY: Vaginal deliveries 3 Tearing Yes:   C-section deliveries 0 Currently pregnant No  PROLAPSE: None   OBJECTIVE:  Note: Objective measures were completed at Evaluation unless otherwise noted.     COGNITION: Overall cognitive status: Within functional limits for tasks assessed     SENSATION: Light touch: Appears intact Proprioception: Appears intact  MUSCLE LENGTH: Hamstrings: Right 80 deg; Left 80 deg   LUMBAR SPECIAL TESTS:  Long sit test: Positive     POSTURE: rounded shoulders and forward head  PELVIC ALIGNMENT: even  LUMBARAROM/PROM:  A/PROM A/PROM  eval  Flexion limited  Extension   Right lateral flexion   Left lateral flexion   Right rotation   Left rotation    (Blank rows = not tested)  LOWER EXTREMITY ROM:  Passive ROM Right eval Left eval  Hip flexion    Hip extension    Hip abduction    Hip adduction    Hip internal rotation    Hip external rotation    Knee flexion    Knee extension    Ankle dorsiflexion    Ankle plantarflexion    Ankle inversion    Ankle eversion     (Blank rows = not tested)  LOWER EXTREMITY MMT: grossly within functional limitations    PALPATION:   General  upper chest breathing Tenderness abdominal scars Restrictions throughout abdomen                 External Perineal Exam -within functional limitations  Internal Pelvic Floor decreased tone throughout  Patient confirms identification and approves PT to  assess internal pelvic floor and treatment Yes  PELVIC MMT:   MMT eval  Vaginal 3/5  Internal Anal Sphincter 3/5  External Anal Sphincter 3/5  Puborectalis 2/5  Diastasis Recti no  (Blank rows = not tested)        TONE: Anterior and posterior wall laxity, restrictions throughout perineal scar  PROLAPSE: Yes- bladder- checked in hooklying Upper chest breathing, difficulty with DB   TODAY'S TREATMENT:                                                                                                                              DATE: 09/23/23   EVAL see above Manual- abdominal massage,     Neuro reed- diaphragmatic breathing reed with theraband child's pose   Therapeutic activities- fiber, constipation   PATIENT EDUCATION:  Education details: relevant anatomy, exam findings, abdominal massage, exercises Person educated: Patient Education method: Explanation, Tactile cues, Verbal cues, and Handouts Education comprehension: verbalized understanding, tactile cues required, and needs further education  HOME EXERCISE PROGRAM: [id number]   ASSESSMENT:  CLINICAL IMPRESSION: Tx focus- diaphragmatic breathing, constipation management and dry needling for UT and neck pain. Pt overuses bilateral UTs and dem upper chest breathing and abdominal gripping.   Patient is a 72 y.o. F who was seen today for physical therapy evaluation and treatment for fecal and urinary incontinence. Fecal is her main concern. She presents with weakness and low tone throughout her pelvic floor, upper chest breathing and restrictions throughout her abdomen. She had difficulty coordinating breath and pelvic floor today and will benefit from PT to reduce risk of accidents and improve QOL.  OBJECTIVE IMPAIRMENTS: decreased activity tolerance, decreased coordination, decreased mobility, decreased ROM, decreased strength, increased fascial restrictions, increased muscle spasms, impaired tone, improper body  mechanics, and pain.   ACTIVITY LIMITATIONS: continence and toileting  PARTICIPATION LIMITATIONS: community activity and church  PERSONAL FACTORS: Age and Time since onset of injury/illness/exacerbation are also affecting patient's functional outcome.   REHAB POTENTIAL: Good  CLINICAL DECISION MAKING: Evolving/moderate complexity  EVALUATION COMPLEXITY: Moderate   GOALS: Goals reviewed with patient? Yes  SHORT TERM GOALS: Target date: 10/07/2023    Pt will be I with abdominal massage and use squatty potty to reduce constipation Baseline: Goal status: INITIAL  2.  Pt will be I with initial HEP and dem all exercises correctly Baseline:  Goal status: INITIAL  3.  Pt will report reducing fecal incontinence to mas 3/week Baseline:  Goal status: INITIAL    LONG TERM GOALS: Target date: 11/04/2023    Pt will report 0 fecal accidents/ week Baseline:  Goal status: INITIAL  2.  Pt will soak 0 pads/ week Baseline:  Goal status: INITIAL  3.  Pt will report consistency with HEP Baseline:  Goal status: INITIAL  4.  Pt will dem at least 4/5 pelvic floor strength Baseline:  Goal status:  INITIAL    PLAN:  PT FREQUENCY: 1-2x/week  PT DURATION: 8 weeks  PLANNED INTERVENTIONS: 97110-Therapeutic exercises, 97530- Therapeutic activity, 97112- Neuromuscular re-education, 97535- Self Care, [id number]- Manual therapy, Dry Needling, Joint manipulation, Spinal manipulation, Spinal mobilization, and Scar mobilization  PLAN FOR NEXT SESSION: cont core strengthening  Trigger Point Dry-Needling  Treatment instructions: Expect mild to moderate muscle soreness. S/S of pneumothorax if dry needled over a lung field, and to seek immediate medical attention should they occur. Patient verbalized understanding of these instructions and education.  Patient Consent Given: Yes Education handout provided: Yes Muscles treated: bilateral UTs Treatment response/outcome: Utilized skilled  palpation to identify trigger points.  During dry needling able to palpate muscle twitch and muscle elongation   Skilled palpation and monitoring by PT during dry needling   Ciin Brazzel, PT 09/23/23 10:15 AM

## 2023-09-23 NOTE — Patient Instructions (Signed)
About Constipation  Constipation Overview Constipation is the most common gastrointestinal complaint -- about 4 million Americans experience constipation and make 2.5 million physician visits a year to get help for the problem.  Constipation can occur when the colon absorbs too much water, the colon's muscle contraction is slow or sluggish, and/or there is delayed transit time through the colon.  The result is stool that is hard and dry.  Indicators of constipation include straining during bowel movements greater than 25% of the time, having fewer than three bowel movements per week, and/or the feeling of incomplete evacuation.  There are established guidelines ([place] ) for defining constipation. A person needs to have two or more of the following symptoms for at least 12 weeks (not necessarily consecutive) in the preceding 12 months: Straining in  greater than 25% of bowel movements Lumpy or hard stools in greater than 25% of bowel movements Sensation of incomplete emptying in greater than 25% of bowel movements Sensation of anorectal obstruction/blockade in greater than 25% of bowel movements Manual maneuvers to help empty greater than 25% of bowel movements (e.g., digital evacuation, support of the pelvic floor)  Less than  3 bowel movements/week Loose stools are not present, and criteria for irritable bowel syndrome are insufficient Common Causes of Constipation lack of fiber in your diet lack of physical activity medications, including iron and calcium supplements  dairy intake dehydration abuse of laxatives travel Irritable Bowel Syndrome pregnancy luteal phase of menstruation (after ovulation and before menses) colorectal problems intestinal dysfunction  Treating Constipation  There are several ways of treating constipation, including changes to diet and exercise, use of laxatives, adjustments to the pelvic floor, and scheduled toileting.  These treatments include: increasing  fiber and fluids in the diet  increasing physical activity learning muscle coordination  learning proper toileting techniques and toileting modifications  designing and sticking  to a toileting schedule

## 2023-09-29 ENCOUNTER — Ambulatory Visit: Payer: Medicare Other | Admitting: Physical Therapy

## 2023-09-29 DIAGNOSIS — M542 Cervicalgia: Secondary | ICD-10-CM

## 2023-09-29 DIAGNOSIS — R293 Abnormal posture: Secondary | ICD-10-CM

## 2023-09-29 DIAGNOSIS — R252 Cramp and spasm: Secondary | ICD-10-CM

## 2023-09-29 DIAGNOSIS — M6281 Muscle weakness (generalized): Secondary | ICD-10-CM | POA: Diagnosis not present

## 2023-09-29 NOTE — Therapy (Addendum)
OUTPATIENT PHYSICAL THERAPY FEMALE PELVIC EVALUATION   Patient Name: Margaret [name] MRN: [id number] DOB:1951-07-21, 72 y.o., female Today's Date: 09/29/2023  END OF SESSION:  PT End of Session - 09/29/23 1416     Visit Number 3    PT Start Time 1400    PT Stop Time 1445    PT Time Calculation (min) 45 min    Activity Tolerance Patient tolerated treatment well    Behavior During Therapy WFL for tasks assessed/performed               Past Medical History:  Diagnosis Date   Allergy    Anemia    Colitis    Dyspareunia    Endometriosis    GERD (gastroesophageal reflux disease)    High cholesterol    Hx of abnormal Pap smear 1980's   Hypertension    Osteopenia    Ulcer    Urinary incontinence    Past Surgical History:  Procedure Laterality Date   ABDOMINAL HYSTERECTOMY  09/2008   TVH/BSO--Dr. Edward Jolly with TVT   BLADDER SUSPENSION  09/2008   Dr. 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   Back pain 04/12/2014   Arthritis 04/12/2014   Knee pain 04/12/2014   Essential hypertension 11/27/2013   Osteoarthritis of left knee 07/16/2012    PCP: Caffie Damme, MD PCP - General  REFERRING PROVIDER: Patton Salles, MD Ref Provider  REFERRING DIAG: N39.46 (ICD-10-CM) - Mixed incontinence  And fecal incontinence THERAPY DIAG:  Muscle weakness  (generalized)  Cramp and spasm  Abnormal posture  Cervicalgia  Rationale for Evaluation and Treatment: Rehabilitation  ONSET DATE: 1- 2 years  SUBJECTIVE:                                                                                                                                                                                           SUBJECTIVE STATEMENT: Pt feeling better about fecal incontinence.  Neck was bothering her a little - maybe DN, or maybe she slept wrong.  Maybe she overworked it  at aerobics today. Reports that her body is tight from aerobics.     Pt reports that she has been busy, did not do her exercises like she was busy.  Fecal incontinence has been pretty good, she knows a lot of it is her diet, lately has not been wanting to eat so much d/t working in the yard She reports she overworked her back, it's better now. Sometimes she gets sciatica left LOWER EXTREMITY, broke foot right side a while ago.  Reports that she needs to strengthen her lower abdomen.     Pt reports that her main concern was her fecal incontinence, she has 3 children, she is 58 years old. She reports that she had an infection in her episiotomy between her anus and her vaginal area. She had a surgery. She believes that area has always been kind of weak. She knows she has to change her diet, she gets constipated if she does not eat enough vegetables.   Fluid intake: No reports that she needs more  PAIN:  Are you having pain? Yes back, sciatica left side NPRS scale: 6-7/10 if she lifts something improperly Pain location:  no  Pain type: aching Pain description: intermittent   Aggravating factors: lifting something Relieving factors: rest  PRECAUTIONS: None  RED FLAGS: None   WEIGHT BEARING RESTRICTIONS: No  FALLS:  Has patient fallen in last 6 months? No  LIVING ENVIRONMENT: Lives with: lives alone Lives in: House/apartment Stairs: no Has following equipment at home:  None  OCCUPATION: retired  PLOF: Independent  PATIENT GOALS: strengthen that area  PERTINENT HISTORY:  Hysterectomy, bladder suspension Sexual abuse: No  BOWEL MOVEMENT: Pain with bowel movement: No Type of bowel movement:Type (Bristol Stool Scale) 1-2 Fully empty rectum: No unless she eats properly Leakage: Yes:   Pads: No - sometimes Fiber supplement: No she reports that she should  URINATION: Pain with urination: No Fully empty bladder: Yes: usually Stream: Weak Urgency: No Frequency: fair amount Leakage: Urge to void, Walking to the bathroom, Coughing, Sneezing, and Laughing Pads: No sometimes   PREGNANCY: Vaginal deliveries 3 Tearing Yes:   C-section deliveries 0 Currently pregnant No  PROLAPSE: None   OBJECTIVE:  Note: Objective measures were completed at Evaluation unless otherwise noted.     COGNITION: Overall cognitive status: Within functional limits for tasks assessed     SENSATION: Light touch: Appears intact Proprioception: Appears intact  MUSCLE LENGTH: Hamstrings: Right 80 deg; Left 80 deg   LUMBAR SPECIAL TESTS:  Long sit test: Positive     POSTURE: rounded shoulders and forward head  PELVIC ALIGNMENT: even  LUMBARAROM/PROM:  A/PROM A/PROM  eval  Flexion limited  Extension   Right lateral flexion   Left lateral flexion   Right rotation   Left rotation    (Blank rows = not tested)  LOWER EXTREMITY ROM:  Passive ROM Right eval Left eval  Hip flexion    Hip extension    Hip abduction    Hip adduction    Hip internal rotation    Hip external rotation    Knee flexion    Knee extension    Ankle dorsiflexion    Ankle plantarflexion    Ankle inversion    Ankle eversion     (Blank rows = not tested)  LOWER EXTREMITY MMT: grossly within functional limitations    PALPATION:   General  upper chest breathing Tenderness abdominal scars Restrictions throughout abdomen  External Perineal Exam  -within functional limitations                              Internal Pelvic Floor decreased tone throughout  Patient confirms identification and approves PT to assess internal pelvic floor and treatment Yes  PELVIC MMT:   MMT eval  Vaginal 3/5  Internal Anal Sphincter 3/5  External Anal Sphincter 3/5  Puborectalis 2/5  Diastasis Recti no  (Blank rows = not tested)        TONE: Anterior and posterior wall laxity, restrictions throughout perineal scar  PROLAPSE: Yes- bladder- checked in hooklying Upper chest breathing, difficulty with DB   TODAY'S TREATMENT:                                                                                                                              DATE: 09/29/23   EVAL see above Manual- abdominal scar massage- performed and pt educated on    Neuro reed- diaphragmatic breathing reed- quadruped [employer] seated, corner stretch, left shoulder press, flys        Therapeutic activities-  Manual- abdominal massage,     Neuro reed- diaphragmatic breathing reed with theraband child's pose   Therapeutic activities- fiber, constipation   PATIENT EDUCATION:  Education details: relevant anatomy, exam findings, abdominal massage, exercises Person educated: Patient Education method: Explanation, Tactile cues, Verbal cues, and Handouts Education comprehension: verbalized understanding, tactile cues required, and needs further education  HOME EXERCISE PROGRAM: [id number]   ASSESSMENT:  CLINICAL IMPRESSION: Pt 's constipation is better, she has been eating better, walking and exercising.  Tx focus- core strengthening.     Tx focus- diaphragmatic breathing, constipation management and dry needling for UT and neck pain. Pt overuses bilateral UTs and dem upper chest breathing and abdominal gripping.   Patient is a 72 y.o. F who was seen today for physical therapy evaluation and treatment for fecal and urinary incontinence. Fecal is her  main concern. She presents with weakness and low tone throughout her pelvic floor, upper chest breathing and restrictions throughout her abdomen. She had difficulty coordinating breath and pelvic floor today and will benefit from PT to reduce risk of accidents and improve QOL.  OBJECTIVE IMPAIRMENTS: decreased activity tolerance, decreased coordination, decreased mobility, decreased ROM, decreased strength, increased fascial restrictions, increased muscle spasms, impaired tone, improper body mechanics, and pain.   ACTIVITY LIMITATIONS: continence and toileting  PARTICIPATION LIMITATIONS: community activity and church  PERSONAL FACTORS: Age and Time since onset of injury/illness/exacerbation are also affecting patient's functional outcome.   REHAB POTENTIAL: Good  CLINICAL DECISION MAKING: Evolving/moderate complexity  EVALUATION COMPLEXITY: Moderate   GOALS: Goals reviewed with patient? Yes  SHORT TERM GOALS: Target date: 10/07/2023    Pt will be I with abdominal massage and use squatty potty to reduce constipation Baseline: Goal status: INITIAL  2.  Pt will be I with initial HEP and  dem all exercises correctly Baseline:  Goal status: INITIAL  3.  Pt will report reducing fecal incontinence to mas 3/week Baseline:  Goal status: INITIAL    LONG TERM GOALS: Target date: 11/04/2023    Pt will report 0 fecal accidents/ week Baseline:  Goal status: INITIAL  2.  Pt will soak 0 pads/ week Baseline:  Goal status: INITIAL  3.  Pt will report consistency with HEP Baseline:  Goal status: INITIAL  4.  Pt will dem at least 4/5 pelvic floor strength Baseline:  Goal status: INITIAL    PLAN:  PT FREQUENCY: 1-2x/week  PT DURATION: 8 weeks  PLANNED INTERVENTIONS: 97110-Therapeutic exercises, 97530- Therapeutic activity, 97112- Neuromuscular re-education, 97535- Self Care, 16109- Manual therapy, Dry Needling, Joint manipulation, Spinal manipulation, Spinal mobilization,  and Scar mobilization  PLAN FOR NEXT SESSION: cont core strengthening  Trigger Point Dry-Needling  Treatment instructions: Expect mild to moderate muscle soreness. S/S of pneumothorax if dry needled over a lung field, and to seek immediate medical attention should they occur. Patient verbalized understanding of these instructions and education.  Patient Consent Given: Yes Education handout provided: Yes Muscles treated: bilateral UTs Treatment response/outcome: Utilized skilled palpation to identify trigger points.  During dry needling able to palpate muscle twitch and muscle elongation   Skilled palpation and monitoring by PT during dry needling   Naamah Boggess, PT 09/29/23 2:18 PM   PHYSICAL THERAPY DISCHARGE SUMMARY  Visits from Start of Care: 3  Current functional level related to goals / functional outcomes: Pt requested to be discharged on 11/16/2022   Remaining deficits: unsure   Education / Equipment: Please see above- HEP, theraband   Patient agrees to discharge. Patient goals were  not met . Patient is being discharged due to the patient's request.

## 2023-10-01 ENCOUNTER — Telehealth: Payer: Self-pay | Admitting: Cardiology

## 2023-10-01 NOTE — Telephone Encounter (Signed)
Pt contacted.   She states she overall has been feeling well lately.  She reports having increased fatigue and heaviness to her lower extremities.   Pt reports she exercises daily and is super active.  She reports she walks her dog a couple times a day and has noted an episode of exertional chest tightness when she was walking the dog.   Pt reports overall she feels super fatigued, has a headache with "head heaviness."  She is monitoring her BP/HR everyday and last reading this morning was 120/80 HR-73.   She denies any orthopnea, dizziness, palpitations, pre-syncope or syncope.  She denies N/V and diaphoresis.    She is requesting to see a MD this week for complaints.   Scheduled her next available appt with a Provider for this Friday 11/22 at 0830 with DOD Dr. Elease Hashimoto.  She is aware to arrive 15 mins prior to that appt.   She declined a visit with our Extender today, requesting to see a Doctor only.  ED precautions provided to the pt if symptoms worsen/persist/return between now and her appt on Friday.  Dr. Rosemary Holms aware of this plan and agreed.   Pt verbalized understanding and agrees with this plan.

## 2023-10-01 NOTE — Progress Notes (Deleted)
  Cardiology Office Note:  .   Date:  10/01/2023  ID:  Margaret [name], DOB Nov 14, 1950, MRN [id number] PCP: Caffie Damme, MD  [facility] Providers Cardiologist:  [place] Click to update primary MD,subspecialty MD or APP then REFRESH:1}   History of Present Illness: .   Margaret [name] is a 72 y.o. female with hx of HTN CAC score is 0 Has minimal disease in her prox LAD by coronary CTA Oct, 2023  She called the office with generalized fatigue and leg heaviness She asked to be seen this week     ROS: ***  Studies Reviewed: .        *** Risk Assessment/Calculations:   {Does this patient have ATRIAL FIBRILLATION?[phone]} No BP recorded.  {Refresh Note OR Click here to enter BP  :1}***       Physical Exam:   VS:  There were no vitals taken for this visit.   Wt Readings from Last 3 Encounters:  08/25/23 169 lb (76.7 kg)  08/14/23 166 lb (75.3 kg)  04/23/23 166 lb (75.3 kg)    GEN: Well nourished, well developed in no acute distress NECK: No JVD; No carotid bruits CARDIAC: ***RRR, no murmurs, rubs, gallops RESPIRATORY:  Clear to auscultation without rales, wheezing or rhonchi  ABDOMEN: Soft, non-tender, non-distended EXTREMITIES:  No edema; No deformity   ASSESSMENT AND PLAN: .   ***    {Are you ordering a CV Procedure (e.g. stress test, cath, DCCV, TEE, etc)?   Press F2        :[id number]}  Dispo: ***  Signed, Kristeen Miss, MD

## 2023-10-01 NOTE — Telephone Encounter (Signed)
Pt c/o Shortness Of Breath: STAT if SOB developed within the last 24 hours or pt is noticeably SOB on the phone  1. Are you currently SOB (can you hear that pt is SOB on the phone)? no  2. How long have you been experiencing SOB? 5 days ago  3. Are you SOB when sitting or when up moving around? Moving around   4. Are you currently experiencing any other symptoms? Fatigued, chest tightness

## 2023-10-03 ENCOUNTER — Ambulatory Visit: Payer: Medicare Other | Admitting: Cardiovascular Disease

## 2023-10-03 NOTE — Telephone Encounter (Signed)
Patient states she continues to feel intermittent chest tightness that is slightly worse when she takes a deep breath, lasts "a few minutes". She states she does not have any SOB. She states when this occurs she will sit and rest for a few minutes, denies any precipitating factors as they seem to occur randomly.  Patient states she does not feel this is related to indigestion. No longer takes omeprazole. Removed from medication list.  Patient saw her PCP this week and states she was told she may have costochondritis and was advised to take Advil or Ibuprofen. Patient states she cannot take these medications as they hurt her stomach.  Patient has NTG SL available, instructed her on NTG SL use. Also advised on ED precautions. Patient verbalized understanding.  Offered next available appt with DOD Dr. Eden Emms on 10/07/23 at 8:30am, patient accepted appt. Reinforced ED precautions.

## 2023-10-03 NOTE — Telephone Encounter (Signed)
Pt called back about her appt. She cancelled because she saw her PCP instead but she is still having symptoms. Please advise if she needs with be scheduled with DOD again.

## 2023-10-03 NOTE — Addendum Note (Signed)
Addended by: Franchot Gallo on: 10/03/2023 09:26 AM   Modules accepted: Orders

## 2023-10-06 NOTE — Progress Notes (Unsigned)
CARDIOLOGY CONSULT NOTE       Patient ID: Margaret [name] MRN: [id number] DOB/AGE: 03-05-1951 72 y.o.   Referring Physician: Katrinka Blazing Primary Physician: Caffie Damme, MD Primary Cardiologist: Rosemary Holms Reason for Consultation: Chest Pain  Active Problems:   * No active hospital problems. *   HPI:  72 y.o. added on to DOD schedule for chest pain. Seen in ED 08/13/23 with HTN and blurry vision. BP normalized and home readings ok Head CT negative Cardiac CT 10/18/22 with calcium score 0 soft plaque in LAD 25-49% stenosis. Normal pericardium CXR 08/13/23 NAD. History of GERD no longer on prilosec. Has had atypical SSCP worse with inspiration No dyspnea ECG 08/13/23 normal sinus rate 71 no signs of pericarditis.  Troponin negative x 2   Patient states she continues to feel intermittent chest tightness that is slightly worse when she takes a deep breath, lasts "a few minutes". She states she does not have any SOB. She states when this occurs she will sit and rest for a few minutes, denies any precipitating factors as they seem to occur randomly. Primary ? 's costochondritis Patient does not want to take NSAI as it hurts her stomach Sees PT for some fecal incontinence ? Related to prior episiotomy and diet  ***  ROS All other systems reviewed and negative except as noted above  Past Medical History:  Diagnosis Date   Allergy    Anemia    Colitis    Dyspareunia    Endometriosis    GERD (gastroesophageal reflux disease)    High cholesterol    Hx of abnormal Pap smear 1980's   Hypertension    Osteopenia    Ulcer    Urinary incontinence     Family History  Problem Relation Age of Onset   Diabetes Mother    Hypertension Mother    Thyroid disease Sister    Seizures Brother    Diabetes Brother    Hypertension Brother    Diabetes Brother    Arthritis Other    Diabetes Other    Breast cancer Cousin     Social History   Socioeconomic History   Marital status: Single     Spouse name: Not on file   Number of children: Not on file   Years of education: college   Highest education level: Not on file  Occupational History   Not on file  Tobacco Use   Smoking status: Never   Smokeless tobacco: Never  Vaping Use   Vaping status: Never Used  Substance and Sexual Activity   Alcohol use: Yes    Comment: rare   Drug use: No   Sexual activity: Not Currently    Birth control/protection: Surgical    Comment: TAH/BSO, more than 5, after 18, no STD, no abnormal pap, no DES  Other Topics Concern   Not on file  Social History Narrative   Not on file   Social Determinants of Health   Financial Resource Strain: Not on file  Food Insecurity: Not on file  Transportation Needs: Not on file  Physical Activity: Not on file  Stress: Not on file  Social Connections: Not on file  Intimate Partner Violence: Not on file    Past Surgical History:  Procedure Laterality Date   ABDOMINAL HYSTERECTOMY  09/2008   TVH/BSO--Dr. Edward Jolly with TVT   BLADDER SUSPENSION  09/2008   Dr. Edward Jolly   CERVIX LESION DESTRUCTION  1980   hx abnormal pap--dysplasia   COLONOSCOPY WITH PROPOFOL Left 10/25/2017  Procedure: COLONOSCOPY WITH PROPOFOL;  Surgeon: Kerin Salen, MD;  Location: [facility];  Service: Gastroenterology;  Laterality: Left;      Current Outpatient Medications:    Ascorbic Acid (VITAMIN C) 1000 MG tablet, Take 1,000 mg by mouth daily., Disp: , Rfl:    atorvastatin (LIPITOR) 10 MG tablet, Take 10 mg by mouth daily., Disp: , Rfl:    Cholecalciferol (VITAMIN D) 2000 UNITS CAPS, Take 6,000 Units by mouth daily., Disp: , Rfl:    diclofenac Sodium (VOLTAREN) 1 % GEL, Apply 4 g topically 4 (four) times daily. (Patient taking differently: Apply 4 g topically as needed.), Disp: 100 g, Rfl: 0   docusate sodium (COLACE) 100 MG capsule, 1 capsule as needed, Disp: , Rfl:    Ferrous Sulfate (IRON) 325 (65 Fe) MG TABS, 1 tablet Orally Once a day, Disp: , Rfl:    fluticasone  (FLONASE SENSIMIST) 27.5 MCG/SPRAY nasal spray, 2-4 sprays (1-2 sprays in each nostril) Nasally Once a day for 30 days, Disp: , Rfl:    fluticasone (FLONASE) 50 MCG/ACT nasal spray, Place 1 spray into both nostrils daily as needed for allergies or rhinitis. , Disp: , Rfl:    ibuprofen (ADVIL) 600 MG tablet, Take 1 tablet (600 mg total) by mouth every 6 (six) hours as needed for mild pain., Disp: 30 tablet, Rfl: 1   lisinopril-hydrochlorothiazide (ZESTORETIC) 10-12.5 MG tablet, 1 tablet, Disp: , Rfl:    Magnesium 250 MG TABS, 1 tablet with a meal, Disp: , Rfl:    Multiple Vitamins-Minerals (MULTIVITAMIN ADULT PO), Take 1 tablet by mouth daily., Disp: , Rfl:    nitroGLYCERIN (NITROSTAT) 0.4 MG SL tablet, Place 1 tablet (0.4 mg total) under the tongue every 5 (five) minutes as needed for chest pain., Disp: 30 tablet, Rfl: 3   Olopatadine HCl (PATADAY) 0.2 % SOLN, 1 drop into affected eye Ophthalmic Once a day, Disp: , Rfl:    Omega-3 Fatty Acids (FISH OIL) 1000 MG CPDR, Take by mouth., Disp: , Rfl:     Physical Exam: There were no vitals taken for this visit.   Affect appropriate Healthy:  appears stated age HEENT: normal Neck supple with no adenopathy JVP normal no bruits no thyromegaly Lungs clear with no wheezing and good diaphragmatic motion Heart:  S1/S2 no murmur, no rub, gallop or click PMI normal Abdomen: benighn, BS positve, no tenderness, no AAA no bruit.  No HSM or HJR Distal pulses intact with no bruits No edema Neuro non-focal Skin warm and dry No muscular weakness   Labs:   Lab Results  Component Value Date   WBC 4.1 08/13/2023   HGB 11.2 (L) 08/13/2023   HCT 35.8 (L) 08/13/2023   MCV 85.4 08/13/2023   PLT 252 08/13/2023   No results for input(s): "NA", "K", "CL", "CO2", "BUN", "CREATININE", "CALCIUM", "PROT", "BILITOT", "ALKPHOS", "ALT", "AST", "GLUCOSE" in the last 168 hours.  Invalid input(s): "LABALBU" Lab Results  Component Value Date   TROPONINI <0.03  01/16/2019    Lab Results  Component Value Date   CHOL 181 06/11/2017   Lab Results  Component Value Date   HDL 63 06/11/2017   Lab Results  Component Value Date   LDLCALC 102 (H) 06/11/2017   Lab Results  Component Value Date   TRIG 78 06/11/2017   Lab Results  Component Value Date   CHOLHDL 2.9 06/11/2017   No results found for: "LDLDIRECT"    Radiology: No results found.  EKG: see HPI ***   ASSESSMENT  AND PLAN:   Chest pain:  atypical calcium score 0 with normal pericardium and ECG ? Costochondritis unable to take NSAI's due to stomach pain No obstructive CAD on CTA 08/13/23  Given this can consider neurontin, amytriptyline or Ultram. Short course of prednisone has also been described Check Vitamin D level Check labs to include ESR, CRP, ANA and RF.   ***  F/U Dr Arnell Sieving 4-6 weeks   Signed: Charlton Haws 10/06/2023, 1:41 PM

## 2023-10-07 ENCOUNTER — Encounter: Payer: Self-pay | Admitting: Cardiovascular Disease

## 2023-10-07 ENCOUNTER — Ambulatory Visit: Payer: Medicare Other | Attending: Cardiovascular Disease | Admitting: Cardiovascular Disease

## 2023-10-07 VITALS — BP 110/72 | HR 77 | Ht 66.5 in | Wt 164.2 lb

## 2023-10-07 DIAGNOSIS — I10 Essential (primary) hypertension: Secondary | ICD-10-CM | POA: Diagnosis not present

## 2023-10-07 DIAGNOSIS — R072 Precordial pain: Secondary | ICD-10-CM

## 2023-10-07 DIAGNOSIS — R0789 Other chest pain: Secondary | ICD-10-CM | POA: Diagnosis not present

## 2023-10-07 NOTE — Patient Instructions (Signed)
Medication Instructions:  Your physician recommends that you continue on your current medications as directed. Please refer to the Current Medication list given to you today.  *If you need a refill on your cardiac medications before your next appointment, please call your pharmacy*   Lab Work: NONE If you have labs (blood work) drawn today and your tests are completely normal, you will receive your results only by: MyChart Message (if you have MyChart) OR A paper copy in the mail If you have any lab test that is abnormal or we need to change your treatment, we will call you to review the results.   Testing/Procedures: NONE   Follow-Up: At [facility], you and your health needs are our priority.  As part of our continuing mission to provide you with exceptional heart care, we have created designated Provider Care Teams.  These Care Teams include your primary Cardiologist (physician) and Advanced Practice Providers (APPs -  Physician Assistants and Nurse Practitioners) who all work together to provide you with the care you need, when you need it.   Your next appointment:   6 month(s)  Provider:   Elder Negus, MD

## 2023-10-15 LAB — LAB REPORT - SCANNED
A1c: 6.1
EGFR: 100
PTH: 62.5
TSH: 1.69 (ref 0.41–5.90)

## 2023-10-23 ENCOUNTER — Encounter: Payer: Self-pay | Admitting: Physical Therapy

## 2023-11-14 ENCOUNTER — Ambulatory Visit: Payer: Medicare Other | Admitting: Physical Therapy

## 2023-11-18 ENCOUNTER — Ambulatory Visit: Payer: Medicare Other | Admitting: Physical Therapy

## 2023-11-25 ENCOUNTER — Encounter: Payer: Medicare Other | Admitting: Physical Therapy

## 2023-11-27 ENCOUNTER — Encounter (HOSPITAL_BASED_OUTPATIENT_CLINIC_OR_DEPARTMENT_OTHER): Payer: Self-pay | Admitting: Cardiovascular Disease

## 2023-11-27 ENCOUNTER — Ambulatory Visit (INDEPENDENT_AMBULATORY_CARE_PROVIDER_SITE_OTHER): Payer: Medicare Other | Admitting: Cardiovascular Disease

## 2023-11-27 VITALS — BP 120/78 | HR 76 | Ht 66.5 in | Wt 170.4 lb

## 2023-11-27 DIAGNOSIS — I2510 Atherosclerotic heart disease of native coronary artery without angina pectoris: Secondary | ICD-10-CM

## 2023-11-27 DIAGNOSIS — I10 Essential (primary) hypertension: Secondary | ICD-10-CM

## 2023-11-27 HISTORY — DX: Atherosclerotic heart disease of native coronary artery without angina pectoris: I25.10

## 2023-11-27 MED ORDER — VALSARTAN-HYDROCHLOROTHIAZIDE 80-12.5 MG PO TABS: 1.0000 | ORAL_TABLET | Freq: Every day | ORAL | 1 refills | Status: DC

## 2023-11-27 NOTE — Patient Instructions (Addendum)
Medication Instructions:  STOP LISINOPRIL-HCT   START VALSARTAN-HCT 80-12.5 MG DAILY   RESUME ROSUVASTATIN   Labwork: BMET WHEN YOU RETURN IN 1 MONTH   Testing/Procedures: NONE  Follow-Up: 1 MONTH WITH PHARM D, CAITLIN W NP, OR DR Driscoll   Any Other Special Instructions Will Be Listed Below (If Applicable). MONITOR YOUR BLOOD PRESSURE TWICE A DAY, LOG IN THE BOOK PROVIDED. BRING THE BOOK AND YOUR BLOOD PRESSURE MACHINE TO YOUR FOLLOW UP IN 1 MONTH   If you need a refill on your cardiac medications before your next appointment, please call your pharmacy.  Dr. Stephannie Peters in [facility] for dermatology

## 2023-11-27 NOTE — Progress Notes (Signed)
[facility]pert[facility] Initial Assessment:    Date:  11/27/2023   ID:  [name], DOB September 05, 1951, MRN [id number]  PCP:  Caffie Damme, MD  Cardiologist:  Elder Negus, MD   Referring MD: Elder Negus, MD   CC: Hypertension  History of Present Illness:    Margaret [name] is a 73 y.o. female with a hx of hypertension here to establish care in the [facility]. She previously saw Dr. Rosemary [name] and was thought to have atypical chest pain.  Her ETT in 2019 was negative for ischemia.  Coronary CT-A 10/2022 revealed a calcium score of 0 and had mild blockage in the LAD.  [name] presented for a consultation due to concerns about fluctuating blood pressure readings at home. The patient reported readings ranging from 110s- 140s.  She has been on Lisinopril-HCT for blood pressure control for several years but expressed concerns about its appropriateness, particularly in the context of being of African descent.  [name] also reported a history of anemia and alopecia, with recent noticeable thinning of hair and an increase in the size of a long-standing bald spot.  She expressed concerns about potential links between these symptoms and her current medication regimen.  [name] has a family history of stroke, heart disease, and diabetes. Her mother had a stroke at the age of 64, and two brothers are deceased, one from a stroke and the other from a car accident. The patient's father also had heart issues.  She had been taking Atorvastatin for cholesterol control but decided to stop a couple of months ago due to concerns about potential adverse effects and a desire to manage cholesterol levels through more natural methods. The patient's LDL cholesterol level was reported to be 120.  She reported a regular exercise routine, including aerobics at a senior center and walking a dog, although this has been less frequent  due to cold weather. The patient reported feeling great during exercise but noted occasional shortness of breath when running with the dog.  [name] also reported some soreness in the ankles and a recent episode of knee swelling. The patient has been seeing a naturopath every six months and is considering a visit to a dermatologist due to the alopecia.     Previous antihypertensives:   Past Medical History:  Diagnosis Date   Allergy    Anemia    CAD in native artery 11/27/2023   Colitis    Dyspareunia    Endometriosis    GERD (gastroesophageal reflux disease)    High cholesterol    Hx of abnormal Pap smear 1980's   Hypertension    Osteopenia    Ulcer    Urinary incontinence     Past Surgical History:  Procedure Laterality Date   ABDOMINAL HYSTERECTOMY  09/2008   TVH/BSO--Dr. Edward Jolly with TVT   BLADDER SUSPENSION  09/2008   Dr. Edward Jolly   CERVIX LESION DESTRUCTION  1980   hx abnormal pap--dysplasia   COLONOSCOPY WITH PROPOFOL Left 10/25/2017   Procedure: COLONOSCOPY WITH PROPOFOL;  Surgeon: Kerin Salen, MD;  Location: [facility];  Service: Gastroenterology;  Laterality: Left;    Current Medications: Current Meds  Medication Sig   atorvastatin (LIPITOR) 10 MG tablet Take 10 mg by mouth daily.   Cholecalciferol (VITAMIN D) 2000 UNITS CAPS Take 6,000 Units by mouth daily.   diclofenac Sodium (VOLTAREN) 1 % GEL Apply 4 g topically 4 (four) times daily. (Patient taking differently: Apply 4 g  topically as needed.)   Ferrous Sulfate (IRON) 325 (65 Fe) MG TABS 1 tablet Orally Once a day   fluticasone (FLONASE SENSIMIST) 27.5 MCG/SPRAY nasal spray 2-4 sprays (1-2 sprays in each nostril) Nasally Once a day for 30 days   Magnesium 250 MG TABS 1 tablet with a meal   Multiple Vitamins-Minerals (MULTIVITAMIN ADULT PO) Take 1 tablet by mouth daily.   Omega-3 Fatty Acids (FISH OIL) 1000 MG CPDR Take by mouth.   valsartan-hydrochlorothiazide (DIOVAN-HCT) 80-12.5 MG tablet Take 1  tablet by mouth daily.   [DISCONTINUED] lisinopril-hydrochlorothiazide (ZESTORETIC) 10-12.5 MG tablet 1 tablet     Allergies:   Amoxicillin-pot clavulanate, Ciprofloxacin, Levofloxacin, Levonorgestrel-ethinyl estrad, Macrobid [nitrofurantoin], Pantoprazole sodium, and Sulfa antibiotics   Social History   Socioeconomic History   Marital status: Single    Spouse name: Not on file   Number of children: Not on file   Years of education: college   Highest education level: Not on file  Occupational History   Not on file  Tobacco Use   Smoking status: Never   Smokeless tobacco: Never  Vaping Use   Vaping status: Never Used  Substance and Sexual Activity   Alcohol use: Yes    Comment: rare   Drug use: No   Sexual activity: Not Currently    Birth control/protection: Surgical    Comment: TAH/BSO, more than 5, after 16, no STD, no abnormal pap, no DES  Other Topics Concern   Not on file  Social History Narrative   Not on file   Social Drivers of Health   Financial Resource Strain: Not on file  Food Insecurity: No Food Insecurity (11/27/2023)   Hunger Vital Sign    Worried About Running Out of Food in the Last Year: Never true    Ran Out of Food in the Last Year: Never true  Transportation Needs: No Transportation Needs (11/27/2023)   PRAPARE - Administrator, Civil Service (Medical): No    Lack of Transportation (Non-Medical): No  Physical Activity: Inactive (11/27/2023)   Exercise Vital Sign    Days of Exercise per Week: 0 days    Minutes of Exercise per Session: 0 min  Stress: Not on file  Social Connections: Not on file     Family History: The patient's family history includes Arthritis in an other family member; Breast cancer in her cousin; Diabetes in her brother, brother, mother, and another family member; Hypertension in her brother and mother; Seizures in her brother; Thyroid disease in her sister.  ROS:   Please see the history of present illness.      All other systems reviewed and are negative.  EKGs/Labs/Other Studies Reviewed:    EKG:  EKG is not ordered today.  Recent Labs: 01/02/2023: ALT 13 08/13/2023: BUN 13; Creatinine, Ser 0.68; Hemoglobin 11.2; Platelets 252; Potassium 3.7; Sodium 141   Recent Lipid Panel    Component Value Date/Time   CHOL 181 06/11/2017 1511   TRIG 78 06/11/2017 1511   HDL 63 06/11/2017 1511   CHOLHDL 2.9 06/11/2017 1511   LDLCALC 102 (H) 06/11/2017 1511    Physical Exam:   VS:  BP 120/78   Pulse 76   Ht 5' 6.5" (1.689 m)   Wt 170 lb 6.4 oz (77.3 kg)   SpO2 97%   BMI 27.09 kg/m  , BMI Body mass index is 27.09 kg/m. GENERAL:  Well appearing HEENT: Pupils equal round and reactive, fundi not visualized, oral mucosa unremarkable NECK:  No  jugular venous distention, waveform within normal limits, carotid upstroke brisk and symmetric, no bruits, no thyromegaly LUNGS:  Clear to auscultation bilaterally HEART:  RRR.  PMI not displaced or sustained,S1 and S2 within normal limits, no S3, no S4, no clicks, no rubs, no murmurs ABD:  Flat, positive bowel sounds normal in frequency in pitch, no bruits, no rebound, no guarding, no midline pulsatile mass, no hepatomegaly, no splenomegaly EXT:  2 plus pulses throughout, no edema, no cyanosis no clubbing SKIN:  No rashes no nodules NEURO:  Cranial nerves II through XII grossly intact, motor grossly intact throughout PSYCH:  Cognitively intact, oriented to person place and time   ASSESSMENT/PLAN:    # Hypertension Blood pressure readings vary, with some elevated readings at home.  Well-controlled today.  Currently on Lisinopril-HCT. Discussed the importance of proper blood pressure measurement technique and consistency in medication intake. -Switch Lisinopril-HCT to a comparable dose of Valsartan-HCT. -Advise patient to log blood pressure readings at home after resting for 5 minutes, once daily, alternating between morning and evening readings. -BP goal  <130/80  # Hyperlipidemia Patient has been off Atorvastatin for an unspecified period. LDL is currently 120, which is high considering the presence of mild plaque in the left anterior descending artery. Discussed the benefits and potential side effects of statins.  LDL goal <70 -Resume Atorvastatin 10mg  daily. -Recheck lipid panel, kidney function, liver function, and electrolytes in 3 months.  # Alopecia Patient reports increased hair loss. Discussed potential causes and the role of dermatology in management. -Recommend follow-up with dermatology for further evaluation and management.  # Exercise and Lifestyle Patient has been less active due to the cold weather. Discussed the importance of regular exercise in managing hypertension and hyperlipidemia. -Encourage patient to find indoor activities to maintain regular exercise during cold weather.  Follow-up in 1 month to review home blood pressure readings and reassess management plan.      Disposition:    FU with MD/PharmD in 1 month    Medication Adjustments/Labs and Tests Ordered: Current medicines are reviewed at length with the patient today.  Concerns regarding medicines are outlined above.  Orders Placed This Encounter  Procedures   Basic metabolic panel   Lipid panel   Comprehensive metabolic panel   Meds ordered this encounter  Medications   valsartan-hydrochlorothiazide (DIOVAN-HCT) 80-12.5 MG tablet    Sig: Take 1 tablet by mouth daily.    Dispense:  90 tablet    Refill:  1    D/C LISINOPRIL HCT     Signed, Chilton Si, MD  11/27/2023 11:11 PM    [facility]

## 2023-12-02 ENCOUNTER — Encounter: Payer: Medicare Other | Admitting: Physical Therapy

## 2023-12-06 LAB — BASIC METABOLIC PANEL
BUN/Creatinine Ratio: 20 (ref 12–28)
BUN: 14 mg/dL (ref 8–27)
CO2: 27 mmol/L (ref 20–29)
Calcium: 9.6 mg/dL (ref 8.7–10.3)
Chloride: 100 mmol/L (ref 96–106)
Creatinine, Ser: 0.7 mg/dL (ref 0.57–1.00)
Glucose: 91 mg/dL (ref 70–99)
Potassium: 3.9 mmol/L (ref 3.5–5.2)
Sodium: 140 mmol/L (ref 134–144)
eGFR: 93 mL/min/{1.73_m2} (ref >59)

## 2023-12-06 LAB — LIPID PANEL
Chol/HDL Ratio: 2.4 {ratio} (ref 0.0–4.4)
Cholesterol, Total: 156 mg/dL (ref 100–199)
HDL: 64 mg/dL (ref >39)
LDL Chol Calc (NIH): 73 mg/dL (ref 0–99)
Triglycerides: 108 mg/dL (ref 0–149)
VLDL Cholesterol Cal: 19 mg/dL (ref 5–40)

## 2023-12-06 LAB — COMPREHENSIVE METABOLIC PANEL
ALT: 9 [IU]/L (ref 0–32)
AST: 16 [IU]/L (ref 0–40)
Albumin: 4.3 g/dL (ref 3.9–4.9)
Alkaline Phosphatase: 105 [IU]/L (ref 44–121)
BUN/Creatinine Ratio: 19 (ref 12–28)
BUN: 14 mg/dL (ref 8–27)
Bilirubin Total: 0.4 mg/dL (ref 0.0–1.2)
CO2: 24 mmol/L (ref 20–29)
Calcium: 9.4 mg/dL (ref 8.7–10.3)
Chloride: 101 mmol/L (ref 96–106)
Creatinine, Ser: 0.73 mg/dL (ref 0.57–1.00)
Globulin, Total: 2.8 g/dL (ref 1.5–4.5)
Glucose: 97 mg/dL (ref 70–99)
Potassium: 3.9 mmol/L (ref 3.5–5.2)
Sodium: 141 mmol/L (ref 134–144)
Total Protein: 7.1 g/dL (ref 6.0–8.5)
eGFR: 88 mL/min/{1.73_m2} (ref >59)

## 2023-12-08 ENCOUNTER — Encounter (HOSPITAL_BASED_OUTPATIENT_CLINIC_OR_DEPARTMENT_OTHER): Payer: Self-pay | Admitting: Cardiovascular Disease

## 2023-12-19 LAB — POCT ABI - SCREENING FOR PILOT NO CHARGE
Left ABI: 1.09
Right ABI: 1.15

## 2023-12-19 NOTE — Progress Notes (Signed)
 Pt received ABI screening. No SDOH needs at this time.

## 2024-01-05 ENCOUNTER — Encounter (HOSPITAL_BASED_OUTPATIENT_CLINIC_OR_DEPARTMENT_OTHER): Payer: Self-pay | Admitting: Cardiovascular Disease

## 2024-01-05 ENCOUNTER — Ambulatory Visit (HOSPITAL_BASED_OUTPATIENT_CLINIC_OR_DEPARTMENT_OTHER): Payer: Medicare Other | Admitting: Cardiovascular Disease

## 2024-01-05 VITALS — BP 104/62 | HR 77 | Ht 66.0 in | Wt 171.2 lb

## 2024-01-05 DIAGNOSIS — I2510 Atherosclerotic heart disease of native coronary artery without angina pectoris: Secondary | ICD-10-CM | POA: Diagnosis not present

## 2024-01-05 DIAGNOSIS — I10 Essential (primary) hypertension: Secondary | ICD-10-CM | POA: Diagnosis not present

## 2024-01-05 MED ORDER — HYDROCHLOROTHIAZIDE 12.5 MG PO TABS: 12.5000 mg | ORAL_TABLET | Freq: Every day | ORAL | 3 refills | Status: AC

## 2024-01-05 MED ORDER — VALSARTAN 40 MG PO TABS: 40.0000 mg | ORAL_TABLET | Freq: Every day | ORAL | 3 refills | Status: DC

## 2024-01-05 NOTE — Patient Instructions (Addendum)
 Medication Instructions:  STOP VALSARTAN HCT COMBO PILL  START VALSARTAN 40 MG DAILY   START hydrochlorothiazide 12.5 MG DAILY   Labwork: NONE  Testing/Procedures: NONE  Follow-Up: 3 TO 4 MONTHS   If you need a refill on your cardiac medications before your next appointment, please call your pharmacy.  Dr. Dorothyann Peng Dr. Abbe Amsterdam Ms. Penelope Galas
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
## 2024-01-23 ENCOUNTER — Encounter (HOSPITAL_BASED_OUTPATIENT_CLINIC_OR_DEPARTMENT_OTHER): Payer: Self-pay | Admitting: Cardiovascular Disease

## 2024-01-23 NOTE — Telephone Encounter (Signed)
Please advise for new cholesterol management 

## 2024-01-26 ENCOUNTER — Ambulatory Visit: Attending: Sports Medicine | Admitting: Physical Therapy

## 2024-01-26 ENCOUNTER — Encounter: Payer: Self-pay | Admitting: Physical Therapy

## 2024-01-26 DIAGNOSIS — M25562 Pain in left knee: Secondary | ICD-10-CM | POA: Insufficient documentation

## 2024-01-26 DIAGNOSIS — M25552 Pain in left hip: Secondary | ICD-10-CM | POA: Insufficient documentation

## 2024-01-26 DIAGNOSIS — M5459 Other low back pain: Secondary | ICD-10-CM | POA: Insufficient documentation

## 2024-01-26 MED ORDER — ROSUVASTATIN CALCIUM 10 MG PO TABS
5.0000 mg | ORAL_TABLET | Freq: Every day | ORAL | 1 refills | Status: DC
Start: 2024-01-26 — End: 2024-03-09

## 2024-01-26 NOTE — Therapy (Signed)
 OUTPATIENT PHYSICAL THERAPY LOWER EXTREMITY EVALUATION   Patient Name: Margaret [name] MRN: [id number] DOB:25-May-1951, 73 y.o., female Today's Date: 01/26/2024  END OF SESSION:  PT End of Session - 01/26/24 1615     Visit Number 1    Date for PT Re-Evaluation 04/27/24    Authorization Type BCBS    PT Start Time 1612    PT Stop Time 1700    PT Time Calculation (min) 48 min    Activity Tolerance Patient tolerated treatment well    Behavior During Therapy [facility] for tasks assessed/performed             Past Medical History:  Diagnosis Date   Allergy    Anemia    CAD in native artery 11/27/2023   Colitis    Dyspareunia    Endometriosis    GERD (gastroesophageal reflux disease)    High cholesterol    Hx of abnormal Pap smear 1980's   Hypertension    Osteopenia    Primary hypertension 11/27/2013   Ulcer    Urinary incontinence    Past Surgical History:  Procedure Laterality Date   ABDOMINAL HYSTERECTOMY  09/2008   TVH/BSO--Dr. Edward Jolly with TVT   BLADDER SUSPENSION  09/2008   Dr. Edward Jolly   CERVIX LESION DESTRUCTION  1980   hx abnormal pap--dysplasia   COLONOSCOPY WITH PROPOFOL Left 10/25/2017   Procedure: COLONOSCOPY WITH PROPOFOL;  Surgeon: Kerin Salen, MD;  Location: [facility];  Service: Gastroenterology;  Laterality: Left;   Patient Active Problem List   Diagnosis Date Noted   CAD in native artery 11/27/2023   Gastroesophageal reflux disease 06/06/2020   Atypical chest pain 04/21/2020   Closed nondisplaced fracture of proximal phalanx of lesser toe of left foot 11/19/2017   GI bleed 10/23/2017   Hypokalemia 10/23/2017   Nausea vomiting and diarrhea 10/23/2017   Hematochezia 10/23/2017   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   Back pain 04/12/2014   Arthritis 04/12/2014   Knee pain 04/12/2014   Primary hypertension 11/27/2013   Osteoarthritis of left knee 07/16/2012    PCP: Kevin Fenton, MD  REFERRING PROVIDER: Penni Bombard, MD  REFERRING  DIAG: knee pain  THERAPY DIAG:  Acute pain of left knee  Other low back pain  Pain in left hip  Rationale for Evaluation and Treatment: Rehabilitation  ONSET DATE: 01/20/24  SUBJECTIVE:   SUBJECTIVE STATEMENT: Patient reports that she has had some left knee pain for a few months reports that she has been having left hip pain and the sciatic type pain with some pain in the back, feels like she lifted a heavy box a few months ago.  PERTINENT HISTORY: See above PAIN:  Are you having pain? Yes: NPRS scale: 5 Pain location: left knee, left hip, left low back Pain description: shooting and stabbing in the left, buttock and back, some ache in the left knee Aggravating factors: bending stepping over, stairs, sitting pain up to 8/10 Relieving factors: prednisone, movement, the prednisone has really helped now pain 2-3/10  PRECAUTIONS: None  RED FLAGS: None   WEIGHT BEARING RESTRICTIONS: No  FALLS:  Has patient fallen in last 6 months? No  LIVING ENVIRONMENT: Lives with: lives alone Lives in: House/apartment Stairs: No Has following equipment at home: None  OCCUPATION: retired  PLOF: Independent and goes to [employer], aerobics, does yardwork and housework  PATIENT GOALS: have less pain  NEXT MD VISIT: May 2025  OBJECTIVE:  Note: Objective measures were completed at Evaluation unless otherwise noted.  DIAGNOSTIC FINDINGS: arthritic and degenerative changes  PATIENT SURVEYS:  Modified Oswestry 24/50 48%   COGNITION: Overall cognitive status: Within functional limits for tasks assessed     SENSATION: WFL  MUSCLE LENGTH: Very tight HS, calves and piriformis  POSTURE: rounded shoulders, forward head, and increased lumbar lordosis  PALPATION: Very tender and tight in the low back, left buttock and the left GT , mild tenderness in the left patellar tendon  LOWER EXTREMITY ROM:  Active ROM Right eval Left eval  Hip flexion  90  Hip extension  10  Hip  abduction  10  Hip adduction    Hip internal rotation    Hip external rotation    Knee flexion  100  Knee extension  0  Ankle dorsiflexion    Ankle plantarflexion    Ankle inversion    Ankle eversion     (Blank rows = not tested) LumbarROM:  decreased 50% LOWER EXTREMITY MMT:  MMT Right eval Left eval  Hip flexion  4  Hip extension  4  Hip abduction  4  Hip adduction    Hip internal rotation    Hip external rotation    Knee flexion  4  Knee extension  4  Ankle dorsiflexion    Ankle plantarflexion    Ankle inversion    Ankle eversion     (Blank rows = not tested)  LOWER EXTREMITY SPECIAL TESTS:  Hip special tests: Maisie Fus test: positive  and Piriformis test: positive  Knee special tests: Patellafemoral grind test: positive   FUNCTIONAL TESTS:  5 times sit to stand: 24 seconds 30 seconds chair stand test Timed up and go (TUG): 16 seconds  GAIT: Distance walked: 100 feet Assistive device utilized: None Level of assistance: Complete Independence Comments: mild antalgic gait on the left                                                                                                                                TREATMENT DATE:  01/26/24 Evaluation    PATIENT EDUCATION:  Education details: POC/HEP Person educated: Patient Education method: Programmer, multimedia, Facilities manager, Verbal cues, and Handouts Education comprehension: verbalized understanding  HOME EXERCISE PROGRAM: Access Code: [email or web address] URL: [email or web address] Date: 01/26/2024 Prepared by: Stacie Glaze  Exercises - Supine Piriformis Stretch Pulling Heel to Hip  - 1 x daily - 7 x weekly - 1 sets - 5 reps - 30 hold - Standing Bilateral Gastroc Stretch with Step  - 1 x daily - 7 x weekly - 1 sets - 5 reps - 30 hold - Seated Hamstring Stretch with Chair  - 1 x daily - 7 x weekly - 1 sets - 5 reps - 30 hold  ASSESSMENT:  CLINICAL IMPRESSION: Patient is a 73 y.o. female who was seen today  for physical therapy evaluation and treatment for left knee pain, left hip and low back pain.  Has some degenerative changes in the knee and the low  back, feels that this issue started with lifting a heavy box.  She just finished prednisone and is feeling a little better.  She is very tight in the hips, HS and calves.     OBJECTIVE IMPAIRMENTS: Abnormal gait, cardiopulmonary status limiting activity, decreased activity tolerance, decreased balance, decreased endurance, decreased mobility, difficulty walking, decreased ROM, decreased strength, increased edema, increased muscle spasms, impaired flexibility, improper body mechanics, and pain.   REHAB POTENTIAL: Good  CLINICAL DECISION MAKING: Stable/uncomplicated  EVALUATION COMPLEXITY: Moderate   GOALS: Goals reviewed with patient? Yes  SHORT TERM GOALS: Target date: 03/14/24 Independent with initial HEP Baseline: Goal status: INITIAL  LONG TERM GOALS: Target date: 04/27/24  Independent with advanced HEP Baseline:  Goal status: INITIAL  2.  Understand posture and body mechanics Baseline:  Goal status: INITIAL  3.  Decrease pain by 50% Baseline:  Goal status: INITIAL  4.  Report abel to sit > 30 minutes and get up without pain Baseline:  Goal status: INITIAL  5.  Report be able to sleep through the night without pain Baseline:  Goal status: INITIAL  PLAN:  PT FREQUENCY: 1-2x/week  PT DURATION: 12 weeks  PLANNED INTERVENTIONS: 97164- PT Re-evaluation, 97110-Therapeutic exercises, 97530- Therapeutic activity, 97112- Neuromuscular re-education, 97535- Self Care, 16109- Manual therapy, [phone]- Gait training, [phone]- Electrical stimulation (unattended), 97035- Ultrasound, 91478- Ionotophoresis 4mg /ml Dexamethasone, Patient/Family education, Balance training, Stair training, Taping, Dry Needling, Joint mobilization, Cryotherapy, and Moist heat  PLAN FOR NEXT SESSION: start exercises, she is coming off the prednisone, see how she is  doing, needs flexibility   Jearld Lesch, PT 01/26/2024, 4:16 PM

## 2024-01-27 NOTE — Progress Notes (Unsigned)
 The patient attended a screening event on 12/19/23. At the event the patient noted she has insurance, has a pcp, and doesn't smoke. Patient did not indicate having any SDOH insecurities. Per chart review pt does have a pcp. The last office visit was on 11/26 referred by the patient's pcp. Chart review also indicates quite a few future appts. No additional Health equity team support indicated at this time.

## 2024-02-02 ENCOUNTER — Ambulatory Visit: Admitting: Physical Therapy

## 2024-02-04 ENCOUNTER — Ambulatory Visit: Admitting: Physical Therapy

## 2024-02-16 ENCOUNTER — Encounter: Payer: Self-pay | Admitting: Physical Therapy

## 2024-02-16 ENCOUNTER — Ambulatory Visit: Attending: Sports Medicine | Admitting: Physical Therapy

## 2024-02-16 DIAGNOSIS — R252 Cramp and spasm: Secondary | ICD-10-CM | POA: Diagnosis present

## 2024-02-16 DIAGNOSIS — M25562 Pain in left knee: Secondary | ICD-10-CM | POA: Diagnosis present

## 2024-02-16 DIAGNOSIS — M5459 Other low back pain: Secondary | ICD-10-CM | POA: Insufficient documentation

## 2024-02-16 DIAGNOSIS — M6281 Muscle weakness (generalized): Secondary | ICD-10-CM | POA: Insufficient documentation

## 2024-02-16 DIAGNOSIS — M25552 Pain in left hip: Secondary | ICD-10-CM | POA: Diagnosis present

## 2024-02-16 DIAGNOSIS — M542 Cervicalgia: Secondary | ICD-10-CM | POA: Insufficient documentation

## 2024-02-16 DIAGNOSIS — R293 Abnormal posture: Secondary | ICD-10-CM | POA: Insufficient documentation

## 2024-02-16 NOTE — Therapy (Signed)
 OUTPATIENT PHYSICAL THERAPY LOWER EXTREMITY TREATMENT   Patient Name: Margaret [name] MRN: [id number] DOB:1951/03/02, 73 y.o., female Today's Date: 02/16/2024  END OF SESSION:  PT End of Session - 02/16/24 1443     Visit Number 2    Date for PT Re-Evaluation 04/27/24    Authorization Type BCBS    PT Start Time 1443    PT Stop Time 1528    PT Time Calculation (min) 45 min    Activity Tolerance Patient tolerated treatment well    Behavior During Therapy [facility] for tasks assessed/performed             Past Medical History:  Diagnosis Date   Allergy    Anemia    CAD in native artery 11/27/2023   Colitis    Dyspareunia    Endometriosis    GERD (gastroesophageal reflux disease)    High cholesterol    Hx of abnormal Pap smear 1980's   Hypertension    Osteopenia    Primary hypertension 11/27/2013   Ulcer    Urinary incontinence    Past Surgical History:  Procedure Laterality Date   ABDOMINAL HYSTERECTOMY  09/2008   TVH/BSO--Dr. Edward Jolly with TVT   BLADDER SUSPENSION  09/2008   Dr. Edward Jolly   CERVIX LESION DESTRUCTION  1980   hx abnormal pap--dysplasia   COLONOSCOPY WITH PROPOFOL Left 10/25/2017   Procedure: COLONOSCOPY WITH PROPOFOL;  Surgeon: Kerin Salen, MD;  Location: [facility];  Service: Gastroenterology;  Laterality: Left;   Patient Active Problem List   Diagnosis Date Noted   CAD in native artery 11/27/2023   Gastroesophageal reflux disease 06/06/2020   Atypical chest pain 04/21/2020   Closed nondisplaced fracture of proximal phalanx of lesser toe of left foot 11/19/2017   GI bleed 10/23/2017   Hypokalemia 10/23/2017   Nausea vomiting and diarrhea 10/23/2017   Hematochezia 10/23/2017   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   Back pain 04/12/2014   Arthritis 04/12/2014   Knee pain 04/12/2014   Primary hypertension 11/27/2013   Osteoarthritis of left knee 07/16/2012    PCP: Kevin Fenton, MD  REFERRING PROVIDER: Penni Bombard, MD  REFERRING  DIAG: knee pain  THERAPY DIAG:  Acute pain of left knee  Other low back pain  Pain in left hip  Muscle weakness (generalized)  Cramp and spasm  Rationale for Evaluation and Treatment: Rehabilitation  ONSET DATE: 01/20/24  SUBJECTIVE:   SUBJECTIVE STATEMENT: Patient reports that she drove to [place], reports had some cramps in the feet and knee stiffness  Patient reports that she has had some left knee pain for a few months reports that she has been having left hip pain and the sciatic type pain with some pain in the back, feels like she lifted a heavy box a few months ago.  PERTINENT HISTORY: See above PAIN:  Are you having pain? Yes: NPRS scale: 5 Pain location: left knee, left hip, left low back Pain description: shooting and stabbing in the left, buttock and back, some ache in the left knee Aggravating factors: bending stepping over, stairs, sitting pain up to 8/10 Relieving factors: prednisone, movement, the prednisone has really helped now pain 2-3/10  PRECAUTIONS: None  RED FLAGS: None   WEIGHT BEARING RESTRICTIONS: No  FALLS:  Has patient fallen in last 6 months? No  LIVING ENVIRONMENT: Lives with: lives alone Lives in: House/apartment Stairs: No Has following equipment at home: None  OCCUPATION: retired  PLOF: Independent and goes to [employer], aerobics, does Presenter, broadcasting and housework  PATIENT GOALS: have less pain  NEXT MD VISIT: May 2025  OBJECTIVE:  Note: Objective measures were completed at Evaluation unless otherwise noted.  DIAGNOSTIC FINDINGS: arthritic and degenerative changes  PATIENT SURVEYS:  Modified Oswestry 24/50 48%   COGNITION: Overall cognitive status: Within functional limits for tasks assessed     SENSATION: WFL  MUSCLE LENGTH: Very tight HS, calves and piriformis  POSTURE: rounded shoulders, forward head, and increased lumbar lordosis  PALPATION: Very tender and tight in the low back, left buttock and the left GT  , mild tenderness in the left patellar tendon  LOWER EXTREMITY ROM:  Active ROM Right eval Left eval  Hip flexion  90  Hip extension  10  Hip abduction  10  Hip adduction    Hip internal rotation    Hip external rotation    Knee flexion  100  Knee extension  0  Ankle dorsiflexion    Ankle plantarflexion    Ankle inversion    Ankle eversion     (Blank rows = not tested) LumbarROM:  decreased 50% LOWER EXTREMITY MMT:  MMT Right eval Left eval  Hip flexion  4  Hip extension  4  Hip abduction  4  Hip adduction    Hip internal rotation    Hip external rotation    Knee flexion  4  Knee extension  4  Ankle dorsiflexion    Ankle plantarflexion    Ankle inversion    Ankle eversion     (Blank rows = not tested)  LOWER EXTREMITY SPECIAL TESTS:  Hip special tests: Maisie Fus test: positive  and Piriformis test: positive  Knee special tests: Patellafemoral grind test: positive   FUNCTIONAL TESTS:  5 times sit to stand: 24 seconds 30 seconds chair stand test Timed up and go (TUG): 16 seconds  GAIT: Distance walked: 100 feet Assistive device utilized: None Level of assistance: Complete Independence Comments: mild antalgic gait on the left                                                                                                                                TREATMENT DATE:  02/16/24 Bike level 3 x 6 minutes Calf stretch on slant board LEg press 20# 2x10 Feet on ball, K2C, rotation, bridges, isomteric abs Passive LE stretches Red tband clamshells SAQ with cues verbal and tactile for VMO  01/26/24 Evaluation    PATIENT EDUCATION:  Education details: POC/HEP Person educated: Patient Education method: Programmer, multimedia, Facilities manager, Verbal cues, and Handouts Education comprehension: verbalized understanding  HOME EXERCISE PROGRAM: Access Code: [email or web address] URL: [email or web address] Date: 01/26/2024 Prepared by: Stacie Glaze  Exercises - Supine  Piriformis Stretch Pulling Heel to Hip  - 1 x daily - 7 x weekly - 1 sets - 5 reps - 30 hold - Standing Bilateral Gastroc Stretch with Step  - 1 x daily - 7 x weekly - 1 sets - 5 reps - 30 hold - Seated  Hamstring Stretch with Chair  - 1 x daily - 7 x weekly - 1 sets - 5 reps - 30 hold  ASSESSMENT:  CLINICAL IMPRESSION: Patient was sick and then went out of town so we have not seen her in 3 weeks, she reports stiff and sore in the knees, hip and low back, I started exercises with some discomfort, tried to avoid pain.  She is very tight in the HS and the piriformis especially on the left  Patient is a 73 y.o. female who was seen today for physical therapy evaluation and treatment for left knee pain, left hip and low back pain.  Has some degenerative changes in the knee and the low back, feels that this issue started with lifting a heavy box.  She just finished prednisone and is feeling a little better.  She is very tight in the hips, HS and calves.     OBJECTIVE IMPAIRMENTS: Abnormal gait, cardiopulmonary status limiting activity, decreased activity tolerance, decreased balance, decreased endurance, decreased mobility, difficulty walking, decreased ROM, decreased strength, increased edema, increased muscle spasms, impaired flexibility, improper body mechanics, and pain.   REHAB POTENTIAL: Good  CLINICAL DECISION MAKING: Stable/uncomplicated  EVALUATION COMPLEXITY: Moderate   GOALS: Goals reviewed with patient? Yes  SHORT TERM GOALS: Target date: 03/14/24 Independent with initial HEP Baseline: Goal status: met 02/16/24  LONG TERM GOALS: Target date: 04/27/24  Independent with advanced HEP Baseline:  Goal status: INITIAL  2.  Understand posture and body mechanics Baseline:  Goal status: INITIAL  3.  Decrease pain by 50% Baseline:  Goal status: INITIAL  4.  Report abel to sit > 30 minutes and get up without pain Baseline:  Goal status: INITIAL  5.  Report be able to sleep through  the night without pain Baseline:  Goal status: INITIAL  PLAN:  PT FREQUENCY: 1-2x/week  PT DURATION: 12 weeks  PLANNED INTERVENTIONS: 97164- PT Re-evaluation, 97110-Therapeutic exercises, 97530- Therapeutic activity, 97112- Neuromuscular re-education, 97535- Self Care, 16109- Manual therapy, [phone]- Gait training, [phone]- Electrical stimulation (unattended), 97035- Ultrasound, 91478- Ionotophoresis 4mg /ml Dexamethasone, Patient/Family education, Balance training, Stair training, Taping, Dry Needling, Joint mobilization, Cryotherapy, and Moist heat  PLAN FOR NEXT SESSION: start exercises, she is coming off the prednisone, see how she is doing, needs flexibility   Jearld Lesch, PT 02/16/2024, 2:43 PM

## 2024-02-23 ENCOUNTER — Ambulatory Visit: Admitting: Physical Therapy

## 2024-02-23 ENCOUNTER — Encounter: Payer: Self-pay | Admitting: Physical Therapy

## 2024-02-23 DIAGNOSIS — R293 Abnormal posture: Secondary | ICD-10-CM

## 2024-02-23 DIAGNOSIS — M5459 Other low back pain: Secondary | ICD-10-CM

## 2024-02-23 DIAGNOSIS — M25562 Pain in left knee: Secondary | ICD-10-CM

## 2024-02-23 DIAGNOSIS — M6281 Muscle weakness (generalized): Secondary | ICD-10-CM

## 2024-02-23 DIAGNOSIS — M542 Cervicalgia: Secondary | ICD-10-CM

## 2024-02-23 DIAGNOSIS — R252 Cramp and spasm: Secondary | ICD-10-CM

## 2024-02-23 NOTE — Therapy (Signed)
 OUTPATIENT PHYSICAL THERAPY LOWER EXTREMITY TREATMENT   Patient Name: Margaret [name] MRN: [id number] DOB:Sep 22, 1951, 73 y.o., female Today's Date: 02/23/2024  END OF SESSION:  PT End of Session - 02/23/24 1446     Visit Number 3    Date for PT Re-Evaluation 04/27/24    Authorization Type [employer]    PT Start Time 1444    PT Stop Time 1530    PT Time Calculation (min) 46 min    Activity Tolerance Patient tolerated treatment well    Behavior During Therapy [facility] for tasks assessed/performed             Past Medical History:  Diagnosis Date   Allergy    Anemia    CAD in native artery 11/27/2023   Colitis    Dyspareunia    Endometriosis    GERD (gastroesophageal reflux disease)    High cholesterol    Hx of abnormal Pap smear 1980's   Hypertension    Osteopenia    Primary hypertension 11/27/2013   Ulcer    Urinary incontinence    Past Surgical History:  Procedure Laterality Date   ABDOMINAL HYSTERECTOMY  09/2008   TVH/BSO--Dr. Edward Jolly with TVT   BLADDER SUSPENSION  09/2008   Dr. Edward Jolly   CERVIX LESION DESTRUCTION  1980   hx abnormal pap--dysplasia   COLONOSCOPY WITH PROPOFOL Left 10/25/2017   Procedure: COLONOSCOPY WITH PROPOFOL;  Surgeon: Kerin Salen, MD;  Location: [facility];  Service: Gastroenterology;  Laterality: Left;   Patient Active Problem List   Diagnosis Date Noted   CAD in native artery 11/27/2023   Gastroesophageal reflux disease 06/06/2020   Atypical chest pain 04/21/2020   Closed nondisplaced fracture of proximal phalanx of lesser toe of left foot 11/19/2017   GI bleed 10/23/2017   Hypokalemia 10/23/2017   Nausea vomiting and diarrhea 10/23/2017   Hematochezia 10/23/2017   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   Back pain 04/12/2014   Arthritis 04/12/2014   Knee pain 04/12/2014   Primary hypertension 11/27/2013   Osteoarthritis of left knee 07/16/2012    PCP: Kevin Fenton, MD  REFERRING PROVIDER: Penni Bombard, MD  REFERRING  DIAG: knee pain  THERAPY DIAG:  Acute pain of left knee  Other low back pain  Muscle weakness (generalized)  Cramp and spasm  Abnormal posture  Cervicalgia  Rationale for Evaluation and Treatment: Rehabilitation  ONSET DATE: 01/20/24  SUBJECTIVE:   SUBJECTIVE STATEMENT: Feeling a little better overall, some pain  Patient reports that she has had some left knee pain for a few months reports that she has been having left hip pain and the sciatic type pain with some pain in the back, feels like she lifted a heavy box a few months ago.  PERTINENT HISTORY: See above PAIN:  Are you having pain? Yes: NPRS scale: 5 Pain location: left knee, left hip, left low back Pain description: shooting and stabbing in the left, buttock and back, some ache in the left knee Aggravating factors: bending stepping over, stairs, sitting pain up to 8/10 Relieving factors: prednisone, movement, the prednisone has really helped now pain 2-3/10  PRECAUTIONS: None  RED FLAGS: None   WEIGHT BEARING RESTRICTIONS: No  FALLS:  Has patient fallen in last 6 months? No  LIVING ENVIRONMENT: Lives with: lives alone Lives in: House/apartment Stairs: No Has following equipment at home: None  OCCUPATION: retired  PLOF: Independent and goes to [employer], aerobics, does yardwork and housework  PATIENT GOALS: have less pain  NEXT MD VISIT:  May 2025  OBJECTIVE:  Note: Objective measures were completed at Evaluation unless otherwise noted.  DIAGNOSTIC FINDINGS: arthritic and degenerative changes  PATIENT SURVEYS:  Modified Oswestry 24/50 48%   COGNITION: Overall cognitive status: Within functional limits for tasks assessed     SENSATION: WFL  MUSCLE LENGTH: Very tight HS, calves and piriformis  POSTURE: rounded shoulders, forward head, and increased lumbar lordosis  PALPATION: Very tender and tight in the low back, left buttock and the left GT , mild tenderness in the left patellar  tendon  LOWER EXTREMITY ROM:  Active ROM Right eval Left eval  Hip flexion  90  Hip extension  10  Hip abduction  10  Hip adduction    Hip internal rotation    Hip external rotation    Knee flexion  100  Knee extension  0  Ankle dorsiflexion    Ankle plantarflexion    Ankle inversion    Ankle eversion     (Blank rows = not tested) LumbarROM:  decreased 50% LOWER EXTREMITY MMT:  MMT Right eval Left eval  Hip flexion  4  Hip extension  4  Hip abduction  4  Hip adduction    Hip internal rotation    Hip external rotation    Knee flexion  4  Knee extension  4  Ankle dorsiflexion    Ankle plantarflexion    Ankle inversion    Ankle eversion     (Blank rows = not tested)  LOWER EXTREMITY SPECIAL TESTS:  Hip special tests: Maisie Fus test: positive  and Piriformis test: positive  Knee special tests: Patellafemoral grind test: positive   FUNCTIONAL TESTS:  5 times sit to stand: 24 seconds 30 seconds chair stand test Timed up and go (TUG): 16 seconds  GAIT: Distance walked: 100 feet Assistive device utilized: None Level of assistance: Complete Independence Comments: mild antalgic gait on the left                                                                                                                                TREATMENT DATE:  02/23/24 Nustep level 5 x 5 minutes Bike level 4 x 5 minutes Calf stretch LEg press 20# Leg curls 15# Feet on ball bridges, isometric abs Red tband clamshells Ball b/n knees bridges Passive stretch LE's  02/16/24 Bike level 3 x 6 minutes Calf stretch on slant board LEg press 20# 2x10 Feet on ball, K2C, rotation, bridges, isomteric abs Passive LE stretches Red tband clamshells SAQ with cues verbal and tactile for VMO  01/26/24 Evaluation    PATIENT EDUCATION:  Education details: POC/HEP Person educated: Patient Education method: Programmer, multimedia, Facilities manager, Verbal cues, and Handouts Education comprehension: verbalized  understanding  HOME EXERCISE PROGRAM: Access Code: [email or web address] URL: [email or web address] Date: 01/26/2024 Prepared by: Stacie Glaze  Exercises - Supine Piriformis Stretch Pulling Heel to Hip  - 1 x daily - 7 x weekly - 1 sets - 5 reps -  30 hold - Standing Bilateral Gastroc Stretch with Step  - 1 x daily - 7 x weekly - 1 sets - 5 reps - 30 hold - Seated Hamstring Stretch with Chair  - 1 x daily - 7 x weekly - 1 sets - 5 reps - 30 hold  ASSESSMENT:  CLINICAL IMPRESSION: Patient very tight calves and HS.  She continues to c/o pain in the back and the knees.  Tolerates the exercises well but does have some pain and the left knee does have some crepitus at times, trying to strengthen the hips and gain flexibility  Patient is a 73 y.o. female who was seen today for physical therapy evaluation and treatment for left knee pain, left hip and low back pain.  Has some degenerative changes in the knee and the low back, feels that this issue started with lifting a heavy box.  She just finished prednisone and is feeling a little better.  She is very tight in the hips, HS and calves.     OBJECTIVE IMPAIRMENTS: Abnormal gait, cardiopulmonary status limiting activity, decreased activity tolerance, decreased balance, decreased endurance, decreased mobility, difficulty walking, decreased ROM, decreased strength, increased edema, increased muscle spasms, impaired flexibility, improper body mechanics, and pain.   REHAB POTENTIAL: Good  CLINICAL DECISION MAKING: Stable/uncomplicated  EVALUATION COMPLEXITY: Moderate   GOALS: Goals reviewed with patient? Yes  SHORT TERM GOALS: Target date: 03/14/24 Independent with initial HEP Baseline: Goal status: met 02/16/24  LONG TERM GOALS: Target date: 04/27/24  Independent with advanced HEP Baseline:  Goal status: INITIAL  2.  Understand posture and body mechanics Baseline:  Goal status: progressing 02/23/24  3.  Decrease pain by  50% Baseline:  Goal status: progressing 02/23/24  4.  Report abel to sit > 30 minutes and get up without pain Baseline:  Goal status: INITIAL  5.  Report be able to sleep through the night without pain Baseline:  Goal status: INITIAL  PLAN:  PT FREQUENCY: 1-2x/week  PT DURATION: 12 weeks  PLANNED INTERVENTIONS: 97164- PT Re-evaluation, 97110-Therapeutic exercises, 97530- Therapeutic activity, 97112- Neuromuscular re-education, 97535- Self Care, 21308- Manual therapy, [phone]- Gait training, [phone]- Electrical stimulation (unattended), 97035- Ultrasound, 52841- Ionotophoresis 4mg /ml Dexamethasone, Patient/Family education, Balance training, Stair training, Taping, Dry Needling, Joint mobilization, Cryotherapy, and Moist heat  PLAN FOR NEXT SESSION: start exercises, she is coming off the prednisone, see how she is doing, needs flexibility   Hollis Lurie, PT 02/23/2024, 2:48 PM

## 2024-02-24 ENCOUNTER — Encounter (HOSPITAL_BASED_OUTPATIENT_CLINIC_OR_DEPARTMENT_OTHER): Payer: Self-pay | Admitting: Cardiovascular Disease

## 2024-02-24 DIAGNOSIS — I2510 Atherosclerotic heart disease of native coronary artery without angina pectoris: Secondary | ICD-10-CM

## 2024-02-25 ENCOUNTER — Encounter: Payer: Self-pay | Admitting: Nurse Practitioner

## 2024-02-25 ENCOUNTER — Ambulatory Visit (INDEPENDENT_AMBULATORY_CARE_PROVIDER_SITE_OTHER): Admitting: Nurse Practitioner

## 2024-02-25 VITALS — BP 116/70 | HR 87 | Temp 98.4°F

## 2024-02-25 DIAGNOSIS — R350 Frequency of micturition: Secondary | ICD-10-CM | POA: Diagnosis not present

## 2024-02-25 DIAGNOSIS — R1030 Lower abdominal pain, unspecified: Secondary | ICD-10-CM

## 2024-02-25 DIAGNOSIS — M545 Low back pain, unspecified: Secondary | ICD-10-CM | POA: Diagnosis not present

## 2024-02-25 LAB — WET PREP FOR TRICH, YEAST, CLUE

## 2024-02-25 NOTE — Progress Notes (Signed)
   Acute Office Visit  Subjective:    Patient ID: Margaret [name], female    DOB: 04-22-1951, 73 y.o.   MRN: [id number]   HPI 73 y.o. presents today for lower left back pain that radiates to left side pain and hip, urinary frequency and urgency x 6 days. Denies vaginal symptoms. Reports lifting a box a couple of weeks ago and then went on a long car trip. S/P TAH BSO.   No LMP recorded. Patient has had a hysterectomy.    Review of Systems  Constitutional: Negative.   Gastrointestinal:  Positive for abdominal pain (LLQ) and constipation. Negative for diarrhea.  Genitourinary:  Positive for frequency and urgency. Negative for dysuria, flank pain, hematuria, pelvic pain, vaginal discharge and vaginal pain.  Musculoskeletal:  Positive for back pain and myalgias (Left hip).       Objective:    Physical Exam Constitutional:      Appearance: Normal appearance.  Abdominal:     Tenderness: There is no abdominal tenderness. There is no right CVA tenderness, left CVA tenderness, guarding or rebound.  Genitourinary:    General: Normal vulva.     Vagina: Normal.     Uterus: Absent.      BP 116/70 (BP Location: Left Arm, Patient Position: Sitting)   Pulse 87   Temp 98.4 F (36.9 C) (Oral)   SpO2 95%  Wt Readings from Last 3 Encounters:  01/05/24 171 lb 3.2 oz (77.7 kg)  11/27/23 170 lb 6.4 oz (77.3 kg)  10/07/23 164 lb 3.2 oz (74.5 kg)        Patient informed chaperone available to be present for breast and/or pelvic exam. Patient has requested no chaperone to be present. Patient has been advised what will be completed during breast and pelvic exam.   UA trace leukocytes, neg nitrites, neg blood, yellow/slightly cloudy. Microscopic: wbc 0-5, rbc none, few bacteria  Wet prep negative for pathogens  Assessment & Plan:   Problem List Items Addressed This Visit       Other   Back pain   Other Visit Diagnoses       Urinary frequency    -  Primary   Relevant Orders    Urinalysis,Complete w/RFL Culture     Lower abdominal pain       Relevant Orders   WET PREP FOR TRICH, YEAST, CLUE      Plan: Negative wet prep, UA and exam. Appears to be musculoskeletal. Will follow up with PCP.   Return if symptoms worsen or fail to improve.    Andee Bamberger DNP, 2:22 PM 02/25/2024

## 2024-02-27 LAB — URINE CULTURE
MICRO NUMBER:: 16337343
Result:: NO GROWTH
SPECIMEN QUALITY:: ADEQUATE

## 2024-02-27 LAB — URINALYSIS, COMPLETE W/RFL CULTURE
Bilirubin Urine: NEGATIVE
Glucose, UA: NEGATIVE
Hgb urine dipstick: NEGATIVE
Hyaline Cast: NONE SEEN /LPF
Ketones, ur: NEGATIVE
Nitrites, Initial: NEGATIVE
Protein, ur: NEGATIVE
RBC / HPF: NONE SEEN /HPF (ref 0–2)
Specific Gravity, Urine: 1.005 (ref 1.001–1.035)
pH: 5.5 (ref 5.0–8.0)

## 2024-02-27 LAB — CULTURE INDICATED

## 2024-02-29 ENCOUNTER — Encounter: Payer: Self-pay | Admitting: Nurse Practitioner

## 2024-03-01 ENCOUNTER — Ambulatory Visit: Admitting: Physical Therapy

## 2024-03-01 ENCOUNTER — Encounter: Payer: Self-pay | Admitting: Physical Therapy

## 2024-03-01 DIAGNOSIS — M6281 Muscle weakness (generalized): Secondary | ICD-10-CM

## 2024-03-01 DIAGNOSIS — R293 Abnormal posture: Secondary | ICD-10-CM

## 2024-03-01 DIAGNOSIS — M25562 Pain in left knee: Secondary | ICD-10-CM | POA: Diagnosis not present

## 2024-03-01 DIAGNOSIS — M25552 Pain in left hip: Secondary | ICD-10-CM

## 2024-03-01 NOTE — Therapy (Signed)
 OUTPATIENT PHYSICAL THERAPY LOWER EXTREMITY TREATMENT   Patient Name: Margaret [name] MRN: [id number] DOB:03/09/1951, 73 y.o., female Today's Date: 03/01/2024  END OF SESSION:  PT End of Session - 03/01/24 1425     Visit Number 4    Date for PT Re-Evaluation 04/27/24    PT Start Time 1425    PT Stop Time 1510    PT Time Calculation (min) 45 min    Activity Tolerance Patient tolerated treatment well    Behavior During Therapy [facility] for tasks assessed/performed             Past Medical History:  Diagnosis Date   Allergy    Anemia    CAD in native artery 11/27/2023   Colitis    Dyspareunia    Endometriosis    GERD (gastroesophageal reflux disease)    High cholesterol    Hx of abnormal Pap smear 1980's   Hypertension    Osteopenia    Primary hypertension 11/27/2013   Ulcer    Urinary incontinence    Past Surgical History:  Procedure Laterality Date   ABDOMINAL HYSTERECTOMY  09/11/2008   TVH/BSO--Dr. Colvin Dec with TVT   BILATERAL SALPINGOOPHORECTOMY     2009   BLADDER SUSPENSION  09/11/2008   Dr. Colvin Dec   CERVIX LESION DESTRUCTION  11/11/1978   hx abnormal pap--dysplasia   COLONOSCOPY WITH PROPOFOL  Left 10/25/2017   Procedure: COLONOSCOPY WITH PROPOFOL ;  Surgeon: Genell Ken, MD;  Location: [facility];  Service: Gastroenterology;  Laterality: Left;   Patient Active Problem List   Diagnosis Date Noted   CAD in native artery 11/27/2023   Gastroesophageal reflux disease 06/06/2020   Atypical chest pain 04/21/2020   Closed nondisplaced fracture of proximal phalanx of lesser toe of left foot 11/19/2017   GI bleed 10/23/2017   Hypokalemia 10/23/2017   Nausea vomiting and diarrhea 10/23/2017   Hematochezia 10/23/2017   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   Back pain 04/12/2014   Arthritis 04/12/2014   Knee pain 04/12/2014   Primary hypertension 11/27/2013   Osteoarthritis of left knee 07/16/2012    PCP: Rossie Coon, MD  REFERRING PROVIDER:  Jacqulyne Maxim, MD  REFERRING DIAG: knee pain  THERAPY DIAG:  Acute pain of left knee  Muscle weakness (generalized)  Pain in left hip  Abnormal posture  Rationale for Evaluation and Treatment: Rehabilitation  ONSET DATE: 01/20/24  SUBJECTIVE:   SUBJECTIVE STATEMENT: "Knee is bothering me right now"  Patient reports that she has had some left knee pain for a few months reports that she has been having left hip pain and the sciatic type pain with some pain in the back, feels like she lifted a heavy box a few months ago.  PERTINENT HISTORY: See above PAIN:  Are you having pain? Yes: NPRS scale: 4/10 Pain location: left knee, Pain description: shooting, inconsistent  Aggravating factors: bending stepping over, stairs, sitting pain up to 8/10 Relieving factors: prednisone, movement, the prednisone has really helped now pain 2-3/10  PRECAUTIONS: None  RED FLAGS: None   WEIGHT BEARING RESTRICTIONS: No  FALLS:  Has patient fallen in last 6 months? No  LIVING ENVIRONMENT: Lives with: lives alone Lives in: House/apartment Stairs: No Has following equipment at home: None  OCCUPATION: retired  PLOF: Independent and goes to [employer], aerobics, does yardwork and housework  PATIENT GOALS: have less pain  NEXT MD VISIT: May 2025  OBJECTIVE:  Note: Objective measures were completed at Evaluation unless otherwise noted.  DIAGNOSTIC FINDINGS: arthritic and degenerative  changes  PATIENT SURVEYS:  Modified Oswestry 24/50 48%   COGNITION: Overall cognitive status: Within functional limits for tasks assessed     SENSATION: WFL  MUSCLE LENGTH: Very tight HS, calves and piriformis  POSTURE: rounded shoulders, forward head, and increased lumbar lordosis  PALPATION: Very tender and tight in the low back, left buttock and the left GT , mild tenderness in the left patellar tendon  LOWER EXTREMITY ROM:  Active ROM Right eval Left eval  Hip flexion  90  Hip  extension  10  Hip abduction  10  Hip adduction    Hip internal rotation    Hip external rotation    Knee flexion  100  Knee extension  0  Ankle dorsiflexion    Ankle plantarflexion    Ankle inversion    Ankle eversion     (Blank rows = not tested) LumbarROM:  decreased 50% LOWER EXTREMITY MMT:  MMT Right eval Left eval  Hip flexion  4  Hip extension  4  Hip abduction  4  Hip adduction    Hip internal rotation    Hip external rotation    Knee flexion  4  Knee extension  4  Ankle dorsiflexion    Ankle plantarflexion    Ankle inversion    Ankle eversion     (Blank rows = not tested)  LOWER EXTREMITY SPECIAL TESTS:  Hip special tests: Andy Bannister test: positive  and Piriformis test: positive  Knee special tests: Patellafemoral grind test: positive   FUNCTIONAL TESTS:  5 times sit to stand: 24 seconds 30 seconds chair stand test Timed up and go (TUG): 16 seconds  GAIT: Distance walked: 100 feet Assistive device utilized: None Level of assistance: Complete Independence Comments: mild antalgic gait on the left                                                                                                                                TREATMENT DATE:  03/01/24 Nustep level 5 x 5 minutes Bike level 3.0 x 5 minutes 30lb resisted gait forward & backward x3 each Sit to stands 2x10  20lb resisted side steps x3 each Sit to stands 2x10  LLELAQ 3lb 2x10 LLE HS curls blue 2x10 MT, PROM with bilateral LE with end range holds  HS, piriformis, K2C, ITB   02/23/24 Nustep level 5 x 5 minutes Bike level 4 x 5 minutes Calf stretch LEg press 20# Leg curls 15# Feet on ball bridges, isometric abs Red tband clamshells Ball b/n knees bridges Passive stretch LE's  02/16/24 Bike level 3 x 6 minutes Calf stretch on slant board LEg press 20# 2x10 Feet on ball, K2C, rotation, bridges, isomteric abs Passive LE stretches Red tband clamshells SAQ with cues verbal and tactile for  VMO  01/26/24 Evaluation    PATIENT EDUCATION:  Education details: POC/HEP Person educated: Patient Education method: Programmer, multimedia, Facilities manager, Verbal cues, and Handouts Education comprehension: verbalized understanding  HOME EXERCISE PROGRAM: Access Code: [email or web address] URL: [email or web address] Date: 01/26/2024 Prepared by: Cherylene Corrente  Exercises - Supine Piriformis Stretch Pulling Heel to Hip  - 1 x daily - 7 x weekly - 1 sets - 5 reps - 30 hold - Standing Bilateral Gastroc Stretch with Step  - 1 x daily - 7 x weekly - 1 sets - 5 reps - 30 hold - Seated Hamstring Stretch with Chair  - 1 x daily - 7 x weekly - 1 sets - 5 reps - 30 hold  ASSESSMENT:  CLINICAL IMPRESSION: Patient continues to have very tight calves and HS L>R, L ITB tightness present as well.  She continues to c/o pain in the back and the knees.  Tolerates the exercises well, but did have some hip pain with resisted side steps. Cues for full ROM needed with LAQ and HS curls. L ITB was very tight and tender to the touch while stretching.  Patient is a 73 y.o. female who was seen today for physical therapy evaluation and treatment for left knee pain, left hip and low back pain.  Has some degenerative changes in the knee and the low back, feels that this issue started with lifting a heavy box.  She just finished prednisone and is feeling a little better.  She is very tight in the hips, HS and calves.     OBJECTIVE IMPAIRMENTS: Abnormal gait, cardiopulmonary status limiting activity, decreased activity tolerance, decreased balance, decreased endurance, decreased mobility, difficulty walking, decreased ROM, decreased strength, increased edema, increased muscle spasms, impaired flexibility, improper body mechanics, and pain.   REHAB POTENTIAL: Good  CLINICAL DECISION MAKING: Stable/uncomplicated  EVALUATION COMPLEXITY: Moderate   GOALS: Goals reviewed with patient? Yes  SHORT TERM GOALS: Target  date: 03/14/24 Independent with initial HEP Baseline: Goal status: met 02/16/24  LONG TERM GOALS: Target date: 04/27/24  Independent with advanced HEP Baseline:  Goal status: INITIAL  2.  Understand posture and body mechanics Baseline:  Goal status: progressing 02/23/24  3.  Decrease pain by 50% Baseline:  Goal status: progressing 02/23/24  4.  Report abel to sit > 30 minutes and get up without pain Baseline:  Goal status: INITIAL  5.  Report be able to sleep through the night without pain Baseline:  Goal status: INITIAL  PLAN:  PT FREQUENCY: 1-2x/week  PT DURATION: 12 weeks  PLANNED INTERVENTIONS: 97164- PT Re-evaluation, 97110-Therapeutic exercises, 97530- Therapeutic activity, 97112- Neuromuscular re-education, 97535- Self Care, 16109- Manual therapy, [phone]- Gait training, [phone]- Electrical stimulation (unattended), 97035- Ultrasound, 91478- Ionotophoresis 4mg /ml Dexamethasone, Patient/Family education, Balance training, Stair training, Taping, Dry Needling, Joint mobilization, Cryotherapy, and Moist heat  PLAN FOR NEXT SESSION: start exercises, she is coming off the prednisone, see how she is doing, needs flexibility   Ollen Beverage, PTA 03/01/2024, 2:26 PM

## 2024-03-01 NOTE — Telephone Encounter (Signed)
 Scheduled at [facility] 03/09/24

## 2024-03-08 ENCOUNTER — Ambulatory Visit: Admitting: Physical Therapy

## 2024-03-08 ENCOUNTER — Encounter: Payer: Self-pay | Admitting: Physical Therapy

## 2024-03-08 DIAGNOSIS — M25562 Pain in left knee: Secondary | ICD-10-CM | POA: Diagnosis not present

## 2024-03-08 DIAGNOSIS — M6281 Muscle weakness (generalized): Secondary | ICD-10-CM

## 2024-03-08 DIAGNOSIS — M5459 Other low back pain: Secondary | ICD-10-CM

## 2024-03-08 NOTE — Addendum Note (Signed)
 Addended by: Hollis Lurie on: 03/08/2024 02:32 PM   Modules accepted: Orders

## 2024-03-08 NOTE — Therapy (Signed)
 OUTPATIENT PHYSICAL THERAPY LOWER EXTREMITY TREATMENT   Patient Name: Margaret [name] MRN: [id number] DOB:[birth year], 73 y.o., female Today's Date: 03/08/2024  END OF SESSION:  PT End of Session - 03/08/24 1358     Visit Number 5    Date for PT Re-Evaluation 04/27/24    Authorization Type BCBS    PT Start Time 1357    PT Stop Time 1445    PT Time Calculation (min) 48 min    Activity Tolerance Patient tolerated treatment well    Behavior During Therapy [facility] for tasks assessed/performed             Past Medical History:  Diagnosis Date   Allergy    Anemia    CAD in native artery 11/27/2023   Colitis    Dyspareunia    Endometriosis    GERD (gastroesophageal reflux disease)    High cholesterol    Hx of abnormal Pap smear 1980's   Hypertension    Osteopenia    Primary hypertension 11/27/2013   Ulcer    Urinary incontinence    Past Surgical History:  Procedure Laterality Date   ABDOMINAL HYSTERECTOMY  09/11/2008   TVH/BSO--Dr. Colvin Dec with TVT   BILATERAL SALPINGOOPHORECTOMY     2009   BLADDER SUSPENSION  09/11/2008   Dr. Colvin Dec   CERVIX LESION DESTRUCTION  11/11/1978   hx abnormal pap--dysplasia   COLONOSCOPY WITH PROPOFOL  Left 10/25/2017   Procedure: COLONOSCOPY WITH PROPOFOL ;  Surgeon: Genell Ken, MD;  Location: [facility];  Service: Gastroenterology;  Laterality: Left;   Patient Active Problem List   Diagnosis Date Noted   CAD in native artery 11/27/2023   Gastroesophageal reflux disease 06/06/2020   Atypical chest pain 04/21/2020   Closed nondisplaced fracture of proximal phalanx of lesser toe of left foot 11/19/2017   GI bleed 10/23/2017   Hypokalemia 10/23/2017   Nausea vomiting and diarrhea 10/23/2017   Hematochezia 10/23/2017   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 Jacqulyne Maxim last week, she was having some right hip and back pain, he sent a referral over for us  to evaluate and treat.  Pain has gotten worse recently pain in the low back and into the buttocks, past x-rays showed DDD and arthritis  Patient reports that she has had some left knee pain for a few months reports that she has been having left hip pain and the sciatic type pain with some pain in the back, feels like she lifted a heavy box a few months ago.  PERTINENT HISTORY: See above PAIN:  Are you having pain? Yes: NPRS scale: 4/10, pain in the back and hips can get up to 7/10 with sitting, standing worse Pain location: left knee, Pain description: shooting, inconsistent  Aggravating factors: bending stepping over, stairs, sitting pain up to 8/10 Relieving factors: prednisone, movement, the prednisone has really helped now pain 2-3/10  PRECAUTIONS: None  RED FLAGS: None   WEIGHT BEARING RESTRICTIONS: No  FALLS:  Has patient fallen in last 6 months? No  LIVING ENVIRONMENT: Lives with: lives  alone Lives in: House/apartment Stairs: No Has following equipment at home: None  OCCUPATION: retired  PLOF: Independent and goes to [employer], aerobics, does yardwork and housework  PATIENT GOALS: have less pain  NEXT MD VISIT: May 2025  OBJECTIVE:  Note: Objective measures were completed at Evaluation unless otherwise noted.  DIAGNOSTIC FINDINGS: arthritic and degenerative changes  PATIENT SURVEYS:  Modified Oswestry 24/50 48%   COGNITION: Overall cognitive status: Within functional limits for tasks assessed     SENSATION: WFL  MUSCLE LENGTH: Very tight HS, calves and  piriformis  POSTURE: rounded shoulders, forward head, and increased lumbar lordosis  PALPATION: Very tender and tight in the low back, left buttock and the left GT , mild tenderness in the left patellar tendon 03/08/24  significant tenderness int he lumbar area and into the buttocks   LUMBAR ROM 03/09/23:  decreased 50% flexion, decreased 75% for extension with pain, and side bending with pain  LOWER EXTREMITY ROM:  Active ROM Right eval Left eval  Hip flexion  90  Hip extension  10  Hip abduction  10  Hip adduction    Hip internal rotation    Hip external rotation    Knee flexion  100  Knee extension  0  Ankle dorsiflexion    Ankle plantarflexion    Ankle inversion    Ankle eversion     (Blank rows = not tested)  LOWER EXTREMITY MMT:  MMT Right eval Left eval  Hip flexion 4 4  Hip extension 4 4  Hip abduction 4 4  Hip adduction    Hip internal rotation    Hip external rotation    Knee flexion  4  Knee extension  4  Ankle dorsiflexion    Ankle plantarflexion    Ankle inversion    Ankle eversion     (Blank rows = not tested)  LOWER EXTREMITY SPECIAL TESTS:  Hip special tests: Andy Bannister test: positive  and Piriformis test: positive  Knee special tests: Patellafemoral grind test: positive  SLR positive at 50 degrees Tried Manual pelvic distraction, some relief  FUNCTIONAL TESTS:  5 times sit to stand: 24 seconds 30 seconds chair stand test Timed up and go (TUG): 16 seconds  GAIT: Distance walked: 100 feet Assistive device utilized: None Level of assistance: Complete Independence Comments: mild antalgic gait on the left                                                                                                                                TREATMENT DATE:  03/08/24 Evaluation of the low back and hips  03/01/24 Nustep level 5 x 5 minutes Bike level 3.0 x 5 minutes 30lb resisted gait forward & backward x3 each Sit to stands 2x10  20lb resisted side  steps x3 each Sit to stands 2x10  LLELAQ 3lb 2x10 LLE HS curls blue 2x10 MT, PROM with bilateral LE with end range holds  HS, piriformis,  K2C, ITB   02/23/24 Nustep level 5 x 5 minutes Bike level 4 x 5 minutes Calf stretch LEg press 20# Leg curls 15# Feet on ball bridges, isometric abs Red tband clamshells Ball b/n knees bridges Passive stretch LE's  02/16/24 Bike level 3 x 6 minutes Calf stretch on slant board LEg press 20# 2x10 Feet on ball, K2C, rotation, bridges, isomteric abs Passive LE stretches Red tband clamshells SAQ with cues verbal and tactile for VMO  01/26/24 Evaluation    PATIENT EDUCATION:  Education details: POC/HEP Person educated: Patient Education method: Programmer, multimedia, Facilities manager, Verbal cues, and Handouts Education comprehension: verbalized understanding  HOME EXERCISE PROGRAM: Access Code: [email or web address] URL: [email or web address] Date: 01/26/2024 Prepared by: Cherylene Corrente  Exercises - Supine Piriformis Stretch Pulling Heel to Hip  - 1 x daily - 7 x weekly - 1 sets - 5 reps - 30 hold - Standing Bilateral Gastroc Stretch with Step  - 1 x daily - 7 x weekly - 1 sets - 5 reps - 30 hold - Seated Hamstring Stretch with Chair  - 1 x daily - 7 x weekly - 1 sets - 5 reps - 30 hold  ASSESSMENT:  CLINICAL IMPRESSION: Patinet comes in with an evaluate and treat order from Dr. Jacqulyne Maxim for LBP and hip pain.  She reports that past x-rays show DDD/arthritis.  She is very tender in the lumbar area, and into the buttocks and GT areas.  She is very tight and painful with HS and piriformis stretches, I did try manual sheet traction with some relief, she reports that she is worse after sitting and feels this episode started after a drive to and from [place] .  Patient is a 73 y.o. female who was seen today for physical therapy evaluation and treatment for left knee pain, left hip and low back pain.  Has some degenerative changes in the knee and the low  back, feels that this issue started with lifting a heavy box.  She just finished prednisone and is feeling a little better.  She is very tight in the hips, HS and calves.     OBJECTIVE IMPAIRMENTS: Abnormal gait, cardiopulmonary status limiting activity, decreased activity tolerance, decreased balance, decreased endurance, decreased mobility, difficulty walking, decreased ROM, decreased strength, increased edema, increased muscle spasms, impaired flexibility, improper body mechanics, and pain.   REHAB POTENTIAL: Good  CLINICAL DECISION MAKING: Stable/uncomplicated  EVALUATION COMPLEXITY: Moderate   GOALS: Goals reviewed with patient? Yes  SHORT TERM GOALS: Target date: 03/14/24 Independent with initial HEP Baseline: Goal status: met 02/16/24  LONG TERM GOALS: Target date: 04/27/24  Independent with advanced HEP Baseline:  Goal status: INITIAL  2.  Understand posture and body mechanics Baseline:  Goal status: progressing 02/23/24  3.  Decrease pain by 50% Baseline:  Goal status: progressing 02/23/24  4.  Report abel to sit > 30 minutes and get up without pain Baseline:  Goal status: INITIAL  5.  Report be able to sleep through the night without pain Baseline:  Goal status: INITIAL 6.  Increase lumbar ROM 25% 7.  Decrease back pain 25% 8.  Report be able to drive or sit > 1 hour without an increase of pain  PLAN:  PT FREQUENCY: 1-2x/week  PT DURATION: 12 weeks  PLANNED INTERVENTIONS: 97164- PT Re-evaluation, 97110-Therapeutic exercises, 97530- Therapeutic activity, [id number]- Neuromuscular re-education, 97535- Self Care, 62130- Manual therapy, [id number]- Gait training, [phone]- Electrical stimulation (unattended), [id number]- Ultrasound, 46962- Traction (mechanical), [id number]- Ionotophoresis 4mg /ml Dexamethasone, Patient/Family education, Balance training, Stair training, Taping,  Dry Needling, Joint mobilization, Cryotherapy, and Moist heat  PLAN FOR NEXT SESSION: start exercises, she is  coming off the prednisone, see how she is doing, needs flexibility, add treatment to the low back and the hips, could try traction   Hollis Lurie, PT 03/08/2024, 1:58 PM

## 2024-03-09 ENCOUNTER — Ambulatory Visit (HOSPITAL_BASED_OUTPATIENT_CLINIC_OR_DEPARTMENT_OTHER): Admitting: Pharmacist Clinician (PhC)/ Clinical Pharmacy Specialist

## 2024-03-09 ENCOUNTER — Encounter (HOSPITAL_BASED_OUTPATIENT_CLINIC_OR_DEPARTMENT_OTHER): Payer: Self-pay | Admitting: Pharmacist Clinician (PhC)/ Clinical Pharmacy Specialist

## 2024-03-09 DIAGNOSIS — E785 Hyperlipidemia, unspecified: Secondary | ICD-10-CM | POA: Insufficient documentation

## 2024-03-09 MED ORDER — EZETIMIBE 10 MG PO TABS
10.0000 mg | ORAL_TABLET | Freq: Every day | ORAL | 3 refills | Status: AC
Start: 2024-03-09 — End: 2027-08-07

## 2024-03-09 MED ORDER — ROSUVASTATIN CALCIUM 5 MG PO TABS: ORAL_TABLET | ORAL | 3 refills | Status: AC

## 2024-03-09 NOTE — Progress Notes (Signed)
 Office Visit    Patient Name: Margaret [name] Date of Encounter: 03/09/2024  Primary Care Provider:  Bertrum Brodie, MD Primary Cardiologist:  Maudine Sos, MD  Chief Complaint    Hyperlipidemia   Significant Past Medical History   CAD Mild LAD blockage, CAC = 0, 25-49% pLAD non-calcified plaque  HTN Better controlled on valsartan  40, hctz 12.5     Allergies  Allergen Reactions   Amoxicillin -Pot Clavulanate Nausea And Vomiting and Other (See Comments)   Ciprofloxacin Nausea And Vomiting and Other (See Comments)    Other reaction(s): GI bleeding   Levofloxacin Other (See Comments)    Other reaction(s): itchy,achey bones   Levonorgestrel-Ethinyl Estrad Other (See Comments)   Macrobid [Nitrofurantoin] Itching   Pantoprazole  Sodium Other (See Comments)    Other reaction(s): itching, joint pains   Sulfa Antibiotics Other (See Comments)    Unknown Other reaction(s): do not remember    History of Present Illness    Margaret [name] is a 73 y.o. female patient of Dr Theodis Fiscal, in the office today to discuss options for cholesterol management.  She took atorvastatin for some time then developed arthritis type pain in her wrists and legs.  She stopped the atorvastatin for about 2 weeks and noted improvement.  She was then started on rosuvastatin  5 mg daily and has noticed that the pains have returned.  Today she admits being hesitant about medications, she has heard bad things about statin drugs and her naturalist suggested that maybe she would be fine without.   We had a long discussion about the pros and cons of statins and the importance of lowering cholesterol.    LDL Cholesterol goal: LDL < 70  Current Medications: rosuvastatin  5 mg daily  Previously tried:  atorvastatin - myalgias  Family Hx:   mother had stroke at 21, never fully recovered, father had accidental death, brother had stroke with residual paralysis in mid 21's, daughter with juvenile RA,  resolved some with puberty, anemia;   Social Hx: Tobacco:no Alcohol: rare -social  Diet:    more home foods, tries to avoid fried foods mostly, admits to desserts being problem  Exercise: PT for knee, back and hips    Accessory Clinical Findings   Lab Results  Component Value Date   CHOL 156 12/05/2023   HDL 64 12/05/2023   LDLCALC 73 12/05/2023   TRIG 108 12/05/2023   CHOLHDL 2.4 12/05/2023    No results found for: "LIPOA"  Lab Results  Component Value Date   ALT 9 12/05/2023   AST 16 12/05/2023   ALKPHOS 105 12/05/2023   BILITOT 0.4 12/05/2023   Lab Results  Component Value Date   CREATININE 0.70 12/05/2023   BUN 14 12/05/2023   NA 140 12/05/2023   K 3.9 12/05/2023   CL 100 12/05/2023   CO2 27 12/05/2023   No results found for: "HGBA1C"  Home Medications    Current Outpatient Medications  Medication Sig Dispense Refill   ezetimibe (ZETIA) 10 MG tablet Take 1 tablet (10 mg total) by mouth daily. 90 tablet 3   rosuvastatin  (CRESTOR ) 5 MG tablet Take 1 tablet by mouth 2 to 3 days per week 90 tablet 3   Ascorbic Acid (VITAMIN C) 1000 MG tablet Take 1,000 mg by mouth daily.     Cholecalciferol  (VITAMIN D ) 2000 UNITS CAPS Take 6,000 Units by mouth daily.     diclofenac  Sodium (VOLTAREN ) 1 % GEL Apply 4 g topically 4 (four) times daily. (  Patient taking differently: Apply 4 g topically as needed.) 100 g 0   docusate sodium (COLACE) 100 MG capsule      dutasteride (AVODART) 0.5 MG capsule Take 1 tablet by mouth daily.     Ferrous Sulfate (IRON) 325 (65 Fe) MG TABS 1 tablet Orally Once a day     fluocinonide ointment (LIDEX) 0.05 % Apply 1 Application topically daily.     fluticasone (FLONASE SENSIMIST) 27.5 MCG/SPRAY nasal spray 2-4 sprays (1-2 sprays in each nostril) Nasally Once a day for 30 days     fluticasone (FLONASE) 50 MCG/ACT nasal spray Place 1 spray into both nostrils daily as needed for allergies or rhinitis.     hydrochlorothiazide  (HYDRODIURIL ) 12.5 MG  tablet Take 1 tablet (12.5 mg total) by mouth daily. 90 tablet 3   ibuprofen  (ADVIL ) 600 MG tablet Take 1 tablet (600 mg total) by mouth every 6 (six) hours as needed for mild pain. 30 tablet 1   Magnesium 250 MG TABS 1 tablet with a meal     Multiple Vitamins-Minerals (MULTIVITAMIN ADULT PO) Take 1 tablet by mouth daily.     nitroGLYCERIN  (NITROSTAT ) 0.4 MG SL tablet Place 1 tablet (0.4 mg total) under the tongue every 5 (five) minutes as needed for chest pain. 30 tablet 3   Olopatadine HCl (PATADAY) 0.2 % SOLN      Omega-3 Fatty Acids (FISH OIL) 1000 MG CPDR Take by mouth.     valsartan  (DIOVAN ) 40 MG tablet Take 1 tablet (40 mg total) by mouth daily. 90 tablet 3   valsartan -hydrochlorothiazide  (DIOVAN -HCT) 80-12.5 MG tablet Take 1 tablet by mouth daily.     No current facility-administered medications for this visit.     Assessment & Plan    Hyperlipidemia LDL goal <70 Assessment: Patient with ASCVD (25-49% non-calcified plaque in pLAD)  not at LDL goal of < 70 Most recent LDL 73 on 12/05/23 Not able to tolerate statins secondary to myalgias - will re-challenge with twice weekly statin Reviewed options for lowering LDL cholesterol, including +/- of statins and ezetimibe. Discussed mechanisms of action, dosing, side effects, potential decreases in LDL cholesterol and costs.  Also reviewed potential options for patient assistance.  Plan: Patient will hold rosuvastatin  for 2 weeks to determine if myalgias disappear.  Then restart rosuvastatin  at 5 mg twice weekly Start ezetimibe 10 mg once daily Repeat labs after:  2-3 months Lipid Liver function    Donivan Furry, PharmD CPP [facility] [address]  [place], [place] [address] [phone]  03/09/2024, 4:34 PM

## 2024-03-09 NOTE — Assessment & Plan Note (Signed)
 Assessment: Patient with ASCVD (25-49% non-calcified plaque in pLAD)  not at LDL goal of < 70 Most recent LDL 73 on 12/05/23 Not able to tolerate statins secondary to myalgias - will re-challenge with twice weekly statin Reviewed options for lowering LDL cholesterol, including +/- of statins and ezetimibe. Discussed mechanisms of action, dosing, side effects, potential decreases in LDL cholesterol and costs.  Also reviewed potential options for patient assistance.  Plan: Patient will hold rosuvastatin  for 2 weeks to determine if myalgias disappear.  Then restart rosuvastatin  at 5 mg twice weekly Start ezetimibe 10 mg once daily Repeat labs after:  2-3 months Lipid Liver function

## 2024-03-09 NOTE — Patient Instructions (Signed)
 Your Results:             Your most recent labs Goal  Total Cholesterol 156 < 200  Triglycerides 108 < 150  HDL (happy/good cholesterol) 64 > 40  LDL (lousy/bad cholesterol 73 < 70   Medication changes:  Hold rosuvastatin  for about 2 weeks to see if pains improve.  Then resume at 5 mg twice weekly (Monday and Friday)  Start ezetimibe 10 mg once daily.    Lab orders:  We want to repeat labs after 2-3 months.  We will send you a lab order to remind you once we get closer to that time.     Thank you for choosing [facility]

## 2024-03-10 ENCOUNTER — Ambulatory Visit: Admitting: Physical Therapy

## 2024-03-10 ENCOUNTER — Encounter: Payer: Self-pay | Admitting: Physical Therapy

## 2024-03-10 DIAGNOSIS — R293 Abnormal posture: Secondary | ICD-10-CM

## 2024-03-10 DIAGNOSIS — M5459 Other low back pain: Secondary | ICD-10-CM

## 2024-03-10 DIAGNOSIS — M25552 Pain in left hip: Secondary | ICD-10-CM

## 2024-03-10 DIAGNOSIS — M25562 Pain in left knee: Secondary | ICD-10-CM

## 2024-03-10 DIAGNOSIS — M6281 Muscle weakness (generalized): Secondary | ICD-10-CM

## 2024-03-10 NOTE — Therapy (Signed)
 OUTPATIENT PHYSICAL THERAPY LOWER EXTREMITY TREATMENT   Patient Name: Margaret [name] MRN: [id number] DOB:1951-03-14, 73 y.o., female Today's Date: 03/10/2024  END OF SESSION:  PT End of Session - 03/10/24 1347     Visit Number 6    Date for PT Re-Evaluation 04/27/24    PT Start Time 1345    PT Stop Time 1430    PT Time Calculation (min) 45 min    Activity Tolerance Patient tolerated treatment well    Behavior During Therapy [facility] for tasks assessed/performed             Past Medical History:  Diagnosis Date   Allergy    Anemia    CAD in native artery 11/27/2023   Colitis    Dyspareunia    Endometriosis    GERD (gastroesophageal reflux disease)    High cholesterol    Hx of abnormal Pap smear 1980's   Hypertension    Osteopenia    Primary hypertension 11/27/2013   Ulcer    Urinary incontinence    Past Surgical History:  Procedure Laterality Date   ABDOMINAL HYSTERECTOMY  09/11/2008   TVH/BSO--Dr. Colvin Dec with TVT   BILATERAL SALPINGOOPHORECTOMY     2009   BLADDER SUSPENSION  09/11/2008   Dr. Colvin Dec   CERVIX LESION DESTRUCTION  11/11/1978   hx abnormal pap--dysplasia   COLONOSCOPY WITH PROPOFOL  Left 10/25/2017   Procedure: COLONOSCOPY WITH PROPOFOL ;  Surgeon: Genell Ken, MD;  Location: [facility];  Service: Gastroenterology;  Laterality: Left;   Patient Active Problem List   Diagnosis Date Noted   Hyperlipidemia LDL goal <70 03/09/2024   CAD in native artery 11/27/2023   Gastroesophageal reflux disease 06/06/2020   Atypical chest pain 04/21/2020   Closed nondisplaced fracture of proximal phalanx of lesser toe of left foot 11/19/2017   GI bleed 10/23/2017   Hypokalemia 10/23/2017   Nausea vomiting and diarrhea 10/23/2017   Hematochezia 10/23/2017   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   Back pain 04/12/2014   Arthritis 04/12/2014   Knee pain 04/12/2014   Primary hypertension 11/27/2013   Osteoarthritis of left knee 07/16/2012     PCP: Rossie Coon, MD  REFERRING PROVIDER: Jacqulyne Maxim, MD  REFERRING DIAG: knee pain  THERAPY DIAG:  Acute pain of left knee  Other low back pain  Muscle weakness (generalized)  Pain in left hip  Abnormal posture  Rationale for Evaluation and Treatment: Rehabilitation  ONSET DATE: 01/20/24  SUBJECTIVE:   SUBJECTIVE STATEMENT: Getting better, its not hurting as much on my knee  Patient reports that she has had some left knee pain for a few months reports that she has been having left hip pain and the sciatic type pain with some pain in the back, feels like she lifted a heavy box a few months ago.  PERTINENT HISTORY: See above PAIN:  Are you having pain? Yes: NPRS scale: 4/10 L knee  Pain location: left knee, Pain description: shooting, inconsistent  Aggravating factors: bending stepping over, stairs, sitting pain up to 8/10 Relieving factors: prednisone, movement, the prednisone has really helped now pain 2-3/10  PRECAUTIONS: None  RED FLAGS: None   WEIGHT BEARING RESTRICTIONS: No  FALLS:  Has patient fallen in last 6 months? No  LIVING ENVIRONMENT: Lives with: lives alone Lives in: House/apartment Stairs: No Has following equipment at home: None  OCCUPATION: retired  PLOF: Independent and goes to [employer], aerobics, does yardwork and housework  PATIENT GOALS: have less pain  NEXT MD VISIT: May 2025  OBJECTIVE:  Note: Objective measures were completed at Evaluation unless otherwise noted.  DIAGNOSTIC FINDINGS: arthritic and degenerative changes  PATIENT SURVEYS:  Modified Oswestry 24/50 48%   COGNITION: Overall cognitive status: Within functional limits for tasks assessed     SENSATION: WFL  MUSCLE LENGTH: Very tight HS, calves and piriformis  POSTURE: rounded shoulders, forward head, and increased lumbar lordosis  PALPATION: Very tender and tight in the low back, left buttock and the left GT , mild tenderness in the left patellar  tendon 03/08/24  significant tenderness int he lumbar area and into the buttocks   LUMBAR ROM 03/09/23:  decreased 50% flexion, decreased 75% for extension with pain, and side bending with pain  LOWER EXTREMITY ROM:  Active ROM Right eval Left eval  Hip flexion  90  Hip extension  10  Hip abduction  10  Hip adduction    Hip internal rotation    Hip external rotation    Knee flexion  100  Knee extension  0  Ankle dorsiflexion    Ankle plantarflexion    Ankle inversion    Ankle eversion     (Blank rows = not tested)  LOWER EXTREMITY MMT:  MMT Right eval Left eval  Hip flexion 4 4  Hip extension 4 4  Hip abduction 4 4  Hip adduction    Hip internal rotation    Hip external rotation    Knee flexion  4  Knee extension  4  Ankle dorsiflexion    Ankle plantarflexion    Ankle inversion    Ankle eversion     (Blank rows = not tested)  LOWER EXTREMITY SPECIAL TESTS:  Hip special tests: Andy Bannister test: positive  and Piriformis test: positive  Knee special tests: Patellafemoral grind test: positive  SLR positive at 50 degrees Tried Manual pelvic distraction, some relief  FUNCTIONAL TESTS:  5 times sit to stand: 24 seconds 30 seconds chair stand test Timed up and go (TUG): 16 seconds  GAIT: Distance walked: 100 feet Assistive device utilized: None Level of assistance: Complete Independence Comments: mild antalgic gait on the left                                                                                                                                TREATMENT DATE:  03/10/24 NuStep L5 x6 min Shoulder Ext 5lb 2x10 HS curls 20lb 2x10 Leg Ext 5lb 2x10  S2S with yellow ball OH press 2x10 Bridge  x10 MHP to Lumbar spine  Feet on ball   KTC x10  Bridge x10 Lumbar HP  HS, hip flex, piriformis stretch    03/08/24 Evaluation of the low back and hips  03/01/24 Nustep level 5 x 5 minutes Bike level 3.0 x 5 minutes 30lb resisted gait forward & backward x3  each Sit to stands 2x10  20lb resisted side steps x3 each Sit to stands 2x10  LLELAQ 3lb 2x10 LLE HS curls blue  2x10 MT, PROM with bilateral LE with end range holds  HS, piriformis, K2C, ITB   02/23/24 Nustep level 5 x 5 minutes Bike level 4 x 5 minutes Calf stretch LEg press 20# Leg curls 15# Feet on ball bridges, isometric abs Red tband clamshells Ball b/n knees bridges Passive stretch LE's  02/16/24 Bike level 3 x 6 minutes Calf stretch on slant board LEg press 20# 2x10 Feet on ball, K2C, rotation, bridges, isomteric abs Passive LE stretches Red tband clamshells SAQ with cues verbal and tactile for VMO  01/26/24 Evaluation    PATIENT EDUCATION:  Education details: POC/HEP Person educated: Patient Education method: Programmer, multimedia, Facilities manager, Verbal cues, and Handouts Education comprehension: verbalized understanding  HOME EXERCISE PROGRAM: Access Code: [email or web address] URL: [email or web address] Date: 01/26/2024 Prepared by: Cherylene Corrente  Exercises - Supine Piriformis Stretch Pulling Heel to Hip  - 1 x daily - 7 x weekly - 1 sets - 5 reps - 30 hold - Standing Bilateral Gastroc Stretch with Step  - 1 x daily - 7 x weekly - 1 sets - 5 reps - 30 hold - Seated Hamstring Stretch with Chair  - 1 x daily - 7 x weekly - 1 sets - 5 reps - 30 hold  ASSESSMENT:  CLINICAL IMPRESSION: Patient enters reporting decrease knee pain and improvement overall. Focus today spent on postural strengthening and hip mobility.  She is very tight and painful with HS and piriformis stretches L>R. Cue for full TKE needed with seated leg extension. Postural weakness with shoulder Ext. Core instability with LE on Pball.   Patient is a 73 y.o. female who was seen today for physical therapy evaluation and treatment for left knee pain, left hip and low back pain.  Has some degenerative changes in the knee and the low back, feels that this issue started with lifting a heavy box.  She  just finished prednisone and is feeling a little better.  She is very tight in the hips, HS and calves.     OBJECTIVE IMPAIRMENTS: Abnormal gait, cardiopulmonary status limiting activity, decreased activity tolerance, decreased balance, decreased endurance, decreased mobility, difficulty walking, decreased ROM, decreased strength, increased edema, increased muscle spasms, impaired flexibility, improper body mechanics, and pain.   REHAB POTENTIAL: Good  CLINICAL DECISION MAKING: Stable/uncomplicated  EVALUATION COMPLEXITY: Moderate   GOALS: Goals reviewed with patient? Yes  SHORT TERM GOALS: Target date: 03/14/24 Independent with initial HEP Baseline: Goal status: met 02/16/24  LONG TERM GOALS: Target date: 04/27/24  Independent with advanced HEP Baseline:  Goal status: INITIAL  2.  Understand posture and body mechanics Baseline:  Goal status: progressing 02/23/24  3.  Decrease pain by 50% Baseline:  Goal status: progressing 02/23/24  4.  Report abel to sit > 30 minutes and get up without pain Baseline:  Goal status: INITIAL  5.  Report be able to sleep through the night without pain Baseline:  Goal status: INITIAL 6.  Increase lumbar ROM 25% 7.  Decrease back pain 25% 8.  Report be able to drive or sit > 1 hour without an increase of pain  PLAN:  PT FREQUENCY: 1-2x/week  PT DURATION: 12 weeks  PLANNED INTERVENTIONS: 97164- PT Re-evaluation, 97110-Therapeutic exercises, 97530- Therapeutic activity, [id number]- Neuromuscular re-education, 97535- Self Care, 16109- Manual therapy, [id number]- Gait training, [phone]- Electrical stimulation (unattended), [id number]- Ultrasound, 09811- Traction (mechanical), [id number]- Ionotophoresis 4mg /ml Dexamethasone, Patient/Family education, Balance training, Stair training, Taping, Dry Needling, Joint mobilization, Cryotherapy, and Moist heat  PLAN FOR NEXT SESSION: start exercises, she is  coming off the prednisone, see how she is doing, needs flexibility,  add treatment to the low back and the hips, could try traction   Ollen Beverage, PTA 03/10/2024, 1:48 PM

## 2024-03-15 ENCOUNTER — Encounter: Payer: Self-pay | Admitting: Physical Therapy

## 2024-03-15 ENCOUNTER — Ambulatory Visit: Attending: Sports Medicine | Admitting: Physical Therapy

## 2024-03-15 DIAGNOSIS — M25552 Pain in left hip: Secondary | ICD-10-CM | POA: Diagnosis present

## 2024-03-15 DIAGNOSIS — M542 Cervicalgia: Secondary | ICD-10-CM | POA: Insufficient documentation

## 2024-03-15 DIAGNOSIS — M546 Pain in thoracic spine: Secondary | ICD-10-CM | POA: Insufficient documentation

## 2024-03-15 DIAGNOSIS — R252 Cramp and spasm: Secondary | ICD-10-CM | POA: Diagnosis present

## 2024-03-15 DIAGNOSIS — R293 Abnormal posture: Secondary | ICD-10-CM | POA: Insufficient documentation

## 2024-03-15 DIAGNOSIS — R1310 Dysphagia, unspecified: Secondary | ICD-10-CM | POA: Diagnosis present

## 2024-03-15 DIAGNOSIS — M25562 Pain in left knee: Secondary | ICD-10-CM | POA: Insufficient documentation

## 2024-03-15 DIAGNOSIS — M6281 Muscle weakness (generalized): Secondary | ICD-10-CM | POA: Diagnosis present

## 2024-03-15 DIAGNOSIS — M5459 Other low back pain: Secondary | ICD-10-CM | POA: Insufficient documentation

## 2024-03-15 NOTE — Therapy (Signed)
 OUTPATIENT PHYSICAL THERAPY LOWER EXTREMITY TREATMENT   Patient Name: Margaret [name] MRN: [id number] DOB:[birth year], 73 y.o., female Today's Date: 03/15/2024  END OF SESSION:  PT End of Session - 03/15/24 1427     Visit Number 7    Date for PT Re-Evaluation 04/27/24    Authorization Type [employer]    PT Start Time 1430    PT Stop Time 1515    PT Time Calculation (min) 45 min             Past Medical History:  Diagnosis Date   Allergy    Anemia    CAD in native artery 11/27/2023   Colitis    Dyspareunia    Endometriosis    GERD (gastroesophageal reflux disease)    High cholesterol    Hx of abnormal Pap smear 1980's   Hypertension    Osteopenia    Primary hypertension 11/27/2013   Ulcer    Urinary incontinence    Past Surgical History:  Procedure Laterality Date   ABDOMINAL HYSTERECTOMY  09/11/2008   TVH/BSO--Dr. Colvin Dec with TVT   BILATERAL SALPINGOOPHORECTOMY     2009   BLADDER SUSPENSION  09/11/2008   Dr. Colvin Dec   CERVIX LESION DESTRUCTION  11/11/1978   hx abnormal pap--dysplasia   COLONOSCOPY WITH PROPOFOL  Left 10/25/2017   Procedure: COLONOSCOPY WITH PROPOFOL ;  Surgeon: Genell Ken, MD;  Location: [facility];  Service: Gastroenterology;  Laterality: Left;   Patient Active Problem List   Diagnosis Date Noted   Hyperlipidemia LDL goal <70 03/09/2024   CAD in native artery 11/27/2023   Gastroesophageal reflux disease 06/06/2020   Atypical chest pain 04/21/2020   Closed nondisplaced fracture of proximal phalanx of lesser toe of left foot 11/19/2017   GI bleed 10/23/2017   Hypokalemia 10/23/2017   Nausea vomiting and diarrhea 10/23/2017   Hematochezia 10/23/2017   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 Ankles have been giving her some trouble and slight pain L shin.   Patient reports that she has had some left knee pain for a few months reports that she has been having left hip pain and the sciatic type pain with some pain in the back, feels like she lifted a heavy box a few months ago.  PERTINENT HISTORY: See above PAIN:  Are you having pain? Yes: NPRS scale: 3/10   Pain location:   Pain description: shooting, inconsistent  Aggravating factors: bending stepping over, stairs, sitting pain up to 8/10 Relieving factors: prednisone, movement, the prednisone has really helped now pain 2-3/10  PRECAUTIONS: None  RED FLAGS: None   WEIGHT BEARING RESTRICTIONS: No  FALLS:  Has patient fallen in last 6 months? No  LIVING ENVIRONMENT: Lives with: lives alone Lives in: House/apartment Stairs: No Has following equipment at home: None  OCCUPATION: retired  PLOF: Independent and goes to [employer], aerobics, does yardwork and housework  PATIENT GOALS: have less pain  NEXT MD VISIT: May 2025  OBJECTIVE:  Note: Objective measures were completed at Evaluation unless otherwise noted.  DIAGNOSTIC FINDINGS: arthritic and degenerative changes  PATIENT SURVEYS:  Modified Oswestry 24/50 48%   COGNITION: Overall cognitive status: Within functional limits for tasks assessed     SENSATION: WFL  MUSCLE LENGTH: Very tight HS, calves and piriformis  POSTURE: rounded shoulders, forward head, and increased lumbar lordosis  PALPATION: Very tender and tight in the low back, left buttock and the left GT , mild tenderness in the left  patellar tendon 03/08/24  significant tenderness int he lumbar area and into the buttocks   LUMBAR ROM 03/09/23:  decreased 50% flexion, decreased 75% for extension with pain, and side bending with pain  LOWER EXTREMITY ROM:  Active ROM Right eval Left eval  Hip flexion  90  Hip extension  10  Hip abduction  10  Hip adduction    Hip internal rotation    Hip external rotation    Knee flexion  100  Knee extension  0  Ankle dorsiflexion    Ankle plantarflexion    Ankle inversion    Ankle eversion     (Blank rows = not tested)  LOWER EXTREMITY MMT:  MMT Right eval Left eval  Hip flexion 4 4  Hip extension 4 4  Hip abduction 4 4  Hip adduction    Hip internal rotation    Hip external rotation    Knee flexion  4  Knee extension  4  Ankle dorsiflexion    Ankle plantarflexion    Ankle inversion    Ankle eversion     (Blank rows = not tested)  LOWER EXTREMITY SPECIAL TESTS:  Hip special tests: Andy Bannister test: positive  and Piriformis test: positive  Knee special tests: Patellafemoral grind test: positive  SLR positive at 50 degrees Tried Manual pelvic distraction, some relief  FUNCTIONAL TESTS:  5 times sit to stand: 24 seconds 30 seconds chair stand test Timed up and go (TUG): 16 seconds  GAIT: Distance walked: 100 feet Assistive device utilized: None Level of assistance: Complete Independence Comments: mild antalgic gait on the left                                                                                                                                TREATMENT DATE:  03/15/24 Nustep L5 Shoulder ext 5# 2x10 5# row 2x10 6in step up x8 each Green band pull apart x10 S2S with OH yellow press x10 HS,piriformis stretch, KTC  03/10/24 NuStep L5 x6 min Shoulder Ext 5lb 2x10 HS curls 20lb 2x10 Leg Ext 5lb 2x10  S2S with yellow ball OH press 2x10 Bridge  x10 MHP to Lumbar spine  Feet on ball   KTC x10  Bridge x10 Lumbar HP  HS, hip flex,  piriformis stretch    03/08/24 Evaluation of the low back and hips  03/01/24 Nustep level 5 x 5 minutes  Bike level 3.0 x 5 minutes 30lb resisted gait forward & backward x3 each Sit to stands 2x10  20lb resisted side steps x3 each Sit to stands 2x10  LLELAQ 3lb 2x10 LLE HS curls blue 2x10 MT, PROM with bilateral LE with end range holds  HS, piriformis, K2C, ITB   02/23/24 Nustep level 5 x 5 minutes Bike level 4 x 5 minutes Calf stretch LEg press 20# Leg curls 15# Feet on ball bridges, isometric abs Red tband clamshells Ball b/n knees bridges Passive stretch LE's  02/16/24 Bike level 3 x 6 minutes Calf stretch on slant board LEg press 20# 2x10 Feet on ball, K2C, rotation, bridges, isomteric abs Passive LE stretches Red tband clamshells SAQ with cues verbal and tactile for VMO  01/26/24 Evaluation    PATIENT EDUCATION:  Education details: POC/HEP Person educated: Patient Education method: Programmer, multimedia, Facilities manager, Verbal cues, and Handouts Education comprehension: verbalized understanding  HOME EXERCISE PROGRAM: Access Code: [email or web address] URL: [email or web address] Date: 01/26/2024 Prepared by: Cherylene Corrente  Exercises - Supine Piriformis Stretch Pulling Heel to Hip  - 1 x daily - 7 x weekly - 1 sets - 5 reps - 30 hold - Standing Bilateral Gastroc Stretch with Step  - 1 x daily - 7 x weekly - 1 sets - 5 reps - 30 hold - Seated Hamstring Stretch with Chair  - 1 x daily - 7 x weekly - 1 sets - 5 reps - 30 hold  ASSESSMENT:  CLINICAL IMPRESSION: Patient enters reporting decrease knee pain and improvement overall. She tolerated exercises well, but had increased LBP on R side as exercises progress. She had to leave a few minutes early to get to another appointment, so emphasis was on posture and lumbar mobility. She would benefit from further PT to further improve lumbar mobility increase pain free functional movements.    Patient is a 73 y.o. female  who was seen today for physical therapy evaluation and treatment for left knee pain, left hip and low back pain.  Has some degenerative changes in the knee and the low back, feels that this issue started with lifting a heavy box.  She just finished prednisone and is feeling a little better.  She is very tight in the hips, HS and calves.     OBJECTIVE IMPAIRMENTS: Abnormal gait, cardiopulmonary status limiting activity, decreased activity tolerance, decreased balance, decreased endurance, decreased mobility, difficulty walking, decreased ROM, decreased strength, increased edema, increased muscle spasms, impaired flexibility, improper body mechanics, and pain.   REHAB POTENTIAL: Good  CLINICAL DECISION MAKING: Stable/uncomplicated  EVALUATION COMPLEXITY: Moderate   GOALS: Goals reviewed with patient? Yes  SHORT TERM GOALS: Target date: 03/14/24 Independent with initial HEP Baseline: Goal status: met 02/16/24  LONG TERM GOALS: Target date: 04/27/24  Independent with advanced HEP Baseline:  Goal status: INITIAL  2.  Understand posture and body mechanics Baseline:  Goal status: progressing 02/23/24  3.  Decrease pain by 50% Baseline:  Goal status: progressing 02/23/24  4.  Report abel to sit > 30 minutes and get up without pain Baseline:  Goal status: INITIAL  5.  Report be able to sleep through the night without pain Baseline:  Goal status: INITIAL 6.  Increase lumbar ROM 25% 7.  Decrease back pain 25% 8.  Report be able to drive or sit > 1 hour without an increase of pain  PLAN:  PT FREQUENCY: 1-2x/week  PT DURATION: 12 weeks  PLANNED INTERVENTIONS: 97164- PT Re-evaluation, 97110-Therapeutic exercises, 97530- Therapeutic activity, [id number]- Neuromuscular  re-education, [phone]- Self Care, 44034- Manual therapy, [phone]- Gait training, [phone]- Electrical stimulation (unattended), [phone]- Ultrasound, [id number]- Traction (mechanical), [phone]- Ionotophoresis 4mg /ml Dexamethasone, Patient/Family  education, Balance training, Stair training, Taping, Dry Needling, Joint mobilization, Cryotherapy, and Moist heat  PLAN FOR NEXT SESSION: start exercises, she is coming off the prednisone, see how she is doing, needs flexibility, add treatment to the low back and the hips, could try traction   Laurelyn Ponder 03/15/2024, 2:29 PM

## 2024-03-22 ENCOUNTER — Ambulatory Visit: Admitting: Physical Therapy

## 2024-03-22 ENCOUNTER — Encounter: Payer: Self-pay | Admitting: Physical Therapy

## 2024-03-22 DIAGNOSIS — M542 Cervicalgia: Secondary | ICD-10-CM

## 2024-03-22 DIAGNOSIS — M25562 Pain in left knee: Secondary | ICD-10-CM

## 2024-03-22 DIAGNOSIS — M6281 Muscle weakness (generalized): Secondary | ICD-10-CM

## 2024-03-22 DIAGNOSIS — M25552 Pain in left hip: Secondary | ICD-10-CM

## 2024-03-22 DIAGNOSIS — M5459 Other low back pain: Secondary | ICD-10-CM

## 2024-03-22 NOTE — Therapy (Signed)
 OUTPATIENT PHYSICAL THERAPY LOWER EXTREMITY TREATMENT   Patient Name: Margaret [name] MRN: [id number] DOB:[birth year], 73 y.o., female Today's Date: 03/22/2024  END OF SESSION:  PT End of Session - 03/22/24 1435     Visit Number 8    Date for PT Re-Evaluation 04/27/24    PT Start Time 1435    PT Stop Time 1515    PT Time Calculation (min) 40 min    Activity Tolerance Patient tolerated treatment well    Behavior During Therapy [facility] for tasks assessed/performed             Past Medical History:  Diagnosis Date   Allergy    Anemia    CAD in native artery 11/27/2023   Colitis    Dyspareunia    Endometriosis    GERD (gastroesophageal reflux disease)    High cholesterol    Hx of abnormal Pap smear 1980's   Hypertension    Osteopenia    Primary hypertension 11/27/2013   Ulcer    Urinary incontinence    Past Surgical History:  Procedure Laterality Date   ABDOMINAL HYSTERECTOMY  09/11/2008   TVH/BSO--Dr. Colvin Dec with TVT   BILATERAL SALPINGOOPHORECTOMY     2009   BLADDER SUSPENSION  09/11/2008   Dr. Colvin Dec   CERVIX LESION DESTRUCTION  11/11/1978   hx abnormal pap--dysplasia   COLONOSCOPY WITH PROPOFOL  Left 10/25/2017   Procedure: COLONOSCOPY WITH PROPOFOL ;  Surgeon: Genell Ken, MD;  Location: [facility];  Service: Gastroenterology;  Laterality: Left;   Patient Active Problem List   Diagnosis Date Noted   Hyperlipidemia LDL goal <70 03/09/2024   CAD in native artery 11/27/2023   Gastroesophageal reflux disease 06/06/2020   Atypical chest pain 04/21/2020   Closed nondisplaced fracture of proximal phalanx of lesser toe of left foot 11/19/2017   GI bleed 10/23/2017   Hypokalemia 10/23/2017   Nausea vomiting and diarrhea 10/23/2017   Hematochezia 10/23/2017   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   Back pain 04/12/2014   Arthritis 04/12/2014   Knee pain 04/12/2014   Primary hypertension 11/27/2013   Osteoarthritis of left knee 07/16/2012     PCP: Rossie Coon, MD  REFERRING PROVIDER: Jacqulyne Maxim, MD  REFERRING DIAG: knee pain  THERAPY DIAG:  Acute pain of left knee  Other low back pain  Muscle weakness (generalized)  Pain in left hip  Cervicalgia  Rationale for Evaluation and Treatment: Rehabilitation  ONSET DATE: 01/20/24  SUBJECTIVE:   SUBJECTIVE STATEMENT: Back is better but I think it is because they gave me a shot, knee is feeling better, a little pain in the calf at times  Patient reports that she has had some left knee pain for a few months reports that she has been having left hip pain and the sciatic type pain with some pain in the back, feels like she lifted a heavy box a few months ago.  PERTINENT HISTORY: See above PAIN:  Are you having pain? Yes: NPRS scale: 3/10   Pain location: R calf Pain description: shooting, inconsistent  Aggravating factors: bending stepping over, stairs, sitting pain up to 8/10 Relieving factors: prednisone, movement, the prednisone has really helped now pain 2-3/10  PRECAUTIONS: None  RED FLAGS: None   WEIGHT BEARING RESTRICTIONS: No  FALLS:  Has patient fallen in last 6 months? No  LIVING ENVIRONMENT: Lives with: lives alone Lives in: House/apartment Stairs: No Has following equipment at home: None  OCCUPATION: retired  PLOF: Independent and goes to [employer], aerobics, does yardwork  and housework  PATIENT GOALS: have less pain  NEXT MD VISIT: May 2025  OBJECTIVE:  Note: Objective measures were completed at Evaluation unless otherwise noted.  DIAGNOSTIC FINDINGS: arthritic and degenerative changes  PATIENT SURVEYS:  Modified Oswestry 24/50 48%   COGNITION: Overall cognitive status: Within functional limits for tasks assessed     SENSATION: WFL  MUSCLE LENGTH: Very tight HS, calves and piriformis  POSTURE: rounded shoulders, forward head, and increased lumbar lordosis  PALPATION: Very tender and tight in the low back, left buttock and  the left GT , mild tenderness in the left patellar tendon 03/08/24  significant tenderness int he lumbar area and into the buttocks   LUMBAR ROM 03/09/23:  decreased 50% flexion, decreased 75% for extension with pain, and side bending with pain  LOWER EXTREMITY ROM:  Active ROM Left eval Left 03/22/24  Hip flexion 90 86 A  Hip extension 10   Hip abduction 10   Hip adduction    Hip internal rotation    Hip external rotation    Knee flexion 100   Knee extension 0   Ankle dorsiflexion    Ankle plantarflexion    Ankle inversion    Ankle eversion     (Blank rows = not tested)  LOWER EXTREMITY MMT:  MMT Right eval Left eval  Hip flexion 4 4  Hip extension 4 4  Hip abduction 4 4  Hip adduction    Hip internal rotation    Hip external rotation    Knee flexion  4  Knee extension  4  Ankle dorsiflexion    Ankle plantarflexion    Ankle inversion    Ankle eversion     (Blank rows = not tested)  LOWER EXTREMITY SPECIAL TESTS:  Hip special tests: Andy Bannister test: positive  and Piriformis test: positive  Knee special tests: Patellafemoral grind test: positive  SLR positive at 50 degrees Tried Manual pelvic distraction, some relief  FUNCTIONAL TESTS:  5 times sit to stand: 24 seconds 30 seconds chair stand test Timed up and go (TUG): 16 seconds  GAIT: Distance walked: 100 feet Assistive device utilized: None Level of assistance: Complete Independence Comments: mild antalgic gait on the left                                                                                                                                TREATMENT DATE:  03/22/24 NuStep L5 x 6 min Shoulder Ext 5lb 2x10 HS curls 25lb 2x10 Leg Ext 5lb 2x10 Rows 10lb 2x10 MHP to lumbar spine   Bridges  SLR x10 each  LE on pball bridges, oblq, K2C  Passive stretching to HS,piriformis, KTC, and double [facility]   03/15/24 Nustep L5 Shoulder ext 5# 2x10 5# row 2x10 6in step up x8 each Green band pull apart  x10 S2S with OH yellow press x10 HS,piriformis stretch, KTC  03/10/24 NuStep L5 x6 min Shoulder Ext 5lb 2x10 HS  curls 20lb 2x10 Leg Ext 5lb 2x10  S2S with yellow ball OH press 2x10 Bridge  x10 MHP to Lumbar spine  Feet on ball   KTC x10  Bridge x10 Lumbar HP  HS, hip flex, piriformis stretch    03/08/24 Evaluation of the low back and hips  03/01/24 Nustep level 5 x 5 minutes Bike level 3.0 x 5 minutes 30lb resisted gait forward & backward x3 each Sit to stands 2x10  20lb resisted side steps x3 each Sit to stands 2x10  LLELAQ 3lb 2x10 LLE HS curls blue 2x10 MT, PROM with bilateral LE with end range holds  HS, piriformis, K2C, ITB   02/23/24 Nustep level 5 x 5 minutes Bike level 4 x 5 minutes Calf stretch LEg press 20# Leg curls 15# Feet on ball bridges, isometric abs Red tband clamshells Ball b/n knees bridges Passive stretch LE's  02/16/24 Bike level 3 x 6 minutes Calf stretch on slant board LEg press 20# 2x10 Feet on ball, K2C, rotation, bridges, isomteric abs Passive LE stretches Red tband clamshells SAQ with cues verbal and tactile for VMO  01/26/24 Evaluation    PATIENT EDUCATION:  Education details: POC/HEP Person educated: Patient Education method: Programmer, multimedia, Facilities manager, Verbal cues, and Handouts Education comprehension: verbalized understanding  HOME EXERCISE PROGRAM: Access Code: [email or web address] URL: [email or web address] Date: 01/26/2024 Prepared by: Cherylene Corrente  Exercises - Supine Piriformis Stretch Pulling Heel to Hip  - 1 x daily - 7 x weekly - 1 sets - 5 reps - 30 hold - Standing Bilateral Gastroc Stretch with Step  - 1 x daily - 7 x weekly - 1 sets - 5 reps - 30 hold - Seated Hamstring Stretch with Chair  - 1 x daily - 7 x weekly - 1 sets - 5 reps - 30 hold  ASSESSMENT:  CLINICAL IMPRESSION: Patient enters reporting decrease knee and back pain   She reports that she did receive an injection in her hip last week.  She tolerated exercises well with no reports of pain. Quad weakness present with leg extensions. Core weakness present with shoulder extensions so tactile cues to prevent trunk flex . She would benefit from further PT to further improve lumbar mobility increase pain free functional movements.    Patient is a 73 y.o. female who was seen today for physical therapy evaluation and treatment for left knee pain, left hip and low back pain.  Has some degenerative changes in the knee and the low back, feels that this issue started with lifting a heavy box.  She just finished prednisone and is feeling a little better.  She is very tight in the hips, HS and calves.     OBJECTIVE IMPAIRMENTS: Abnormal gait, cardiopulmonary status limiting activity, decreased activity tolerance, decreased balance, decreased endurance, decreased mobility, difficulty walking, decreased ROM, decreased strength, increased edema, increased muscle spasms, impaired flexibility, improper body mechanics, and pain.   REHAB POTENTIAL: Good  CLINICAL DECISION MAKING: Stable/uncomplicated  EVALUATION COMPLEXITY: Moderate   GOALS: Goals reviewed with patient? Yes  SHORT TERM GOALS: Target date: 03/14/24 Independent with initial HEP Baseline: Goal status: met 02/16/24  LONG TERM GOALS: Target date: 04/27/24  Independent with advanced HEP Baseline:  Goal status: INITIAL  2.  Understand posture and body mechanics Baseline:  Goal status: progressing 02/23/24  3.  Decrease pain by 50% Baseline:  Goal status: progressing 02/23/24  4.  Report abel to sit > 30 minutes and get up without pain Baseline:  Goal status: Met 03/22/24  5.  Report be able to sleep through the night without pain Baseline:  Goal status: Progressing 03/22/24 6.  Increase lumbar ROM 25% 7.  Decrease back pain 25% 8.  Report be able to drive or sit > 1 hour without an increase of pain  PLAN:  PT FREQUENCY: 1-2x/week  PT DURATION: 12 weeks  PLANNED  INTERVENTIONS: 97164- PT Re-evaluation, 97110-Therapeutic exercises, 97530- Therapeutic activity, 97112- Neuromuscular re-education, 97535- Self Care, 40981- Manual therapy, [phone]- Gait training, [phone]- Electrical stimulation (unattended), 97035- Ultrasound, 21308- Traction (mechanical), [id number]- Ionotophoresis 4mg /ml Dexamethasone, Patient/Family education, Balance training, Stair training, Taping, Dry Needling, Joint mobilization, Cryotherapy, and Moist heat  PLAN FOR NEXT SESSION: start exercises, she is coming off the prednisone, see how she is doing, needs flexibility, add treatment to the low back and the hips, could try traction   Ollen Beverage, PTA 03/22/2024, 2:36 PM

## 2024-03-24 ENCOUNTER — Encounter: Payer: Self-pay | Admitting: Physical Therapy

## 2024-03-24 ENCOUNTER — Ambulatory Visit: Admitting: Physical Therapy

## 2024-03-24 DIAGNOSIS — M25562 Pain in left knee: Secondary | ICD-10-CM | POA: Diagnosis not present

## 2024-03-24 DIAGNOSIS — M5459 Other low back pain: Secondary | ICD-10-CM

## 2024-03-24 DIAGNOSIS — M6281 Muscle weakness (generalized): Secondary | ICD-10-CM

## 2024-03-24 NOTE — Therapy (Signed)
 OUTPATIENT PHYSICAL THERAPY LOWER EXTREMITY TREATMENT   Patient Name: Margaret [name] MRN: [id number] DOB:May 19, 1951, 73 y.o., female Today's Date: 03/24/2024  END OF SESSION:  PT End of Session - 03/24/24 1518     Visit Number 9    Date for PT Re-Evaluation 04/27/24    PT Start Time 1318    PT Stop Time 1600    PT Time Calculation (min) 162 min    Activity Tolerance Patient tolerated treatment well    Behavior During Therapy [facility] for tasks assessed/performed             Past Medical History:  Diagnosis Date   Allergy    Anemia    CAD in native artery 11/27/2023   Colitis    Dyspareunia    Endometriosis    GERD (gastroesophageal reflux disease)    High cholesterol    Hx of abnormal Pap smear 1980's   Hypertension    Osteopenia    Primary hypertension 11/27/2013   Ulcer    Urinary incontinence    Past Surgical History:  Procedure Laterality Date   ABDOMINAL HYSTERECTOMY  09/11/2008   TVH/BSO--Dr. Colvin Dec with TVT   BILATERAL SALPINGOOPHORECTOMY     2009   BLADDER SUSPENSION  09/11/2008   Dr. Colvin Dec   CERVIX LESION DESTRUCTION  11/11/1978   hx abnormal pap--dysplasia   COLONOSCOPY WITH PROPOFOL  Left 10/25/2017   Procedure: COLONOSCOPY WITH PROPOFOL ;  Surgeon: Genell Ken, MD;  Location: [facility];  Service: Gastroenterology;  Laterality: Left;   Patient Active Problem List   Diagnosis Date Noted   Hyperlipidemia LDL goal <70 03/09/2024   CAD in native artery 11/27/2023   Gastroesophageal reflux disease 06/06/2020   Atypical chest pain 04/21/2020   Closed nondisplaced fracture of proximal phalanx of lesser toe of left foot 11/19/2017   GI bleed 10/23/2017   Hypokalemia 10/23/2017   Nausea vomiting and diarrhea 10/23/2017   Hematochezia 10/23/2017   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   Back pain 04/12/2014   Arthritis 04/12/2014   Knee pain 04/12/2014   Primary hypertension 11/27/2013   Osteoarthritis of left knee 07/16/2012     PCP: Rossie Coon, MD  REFERRING PROVIDER: Jacqulyne Maxim, MD  REFERRING DIAG: knee pain  THERAPY DIAG:  Acute pain of left knee  Other low back pain  Muscle weakness (generalized)  Rationale for Evaluation and Treatment: Rehabilitation  ONSET DATE: 01/20/24  SUBJECTIVE:   SUBJECTIVE STATEMENT: I am feeling better, would like to get back into my aerobics  Patient reports that she has had some left knee pain for a few months reports that she has been having left hip pain and the sciatic type pain with some pain in the back, feels like she lifted a heavy box a few months ago.  PERTINENT HISTORY: See above PAIN:  Are you having pain? Yes: NPRS scale: 3-4/10   Pain location: R calf Pain description: shooting, inconsistent  Aggravating factors: bending stepping over, stairs, sitting pain up to 8/10 Relieving factors: prednisone, movement, the prednisone has really helped now pain 2-3/10  PRECAUTIONS: None  RED FLAGS: None   WEIGHT BEARING RESTRICTIONS: No  FALLS:  Has patient fallen in last 6 months? No  LIVING ENVIRONMENT: Lives with: lives alone Lives in: House/apartment Stairs: No Has following equipment at home: None  OCCUPATION: retired  PLOF: Independent and goes to [employer], aerobics, does yardwork and housework  PATIENT GOALS: have less pain  NEXT MD VISIT: May 2025  OBJECTIVE:  Note: Objective measures were  completed at Evaluation unless otherwise noted.  DIAGNOSTIC FINDINGS: arthritic and degenerative changes  PATIENT SURVEYS:  Modified Oswestry 24/50 48%   COGNITION: Overall cognitive status: Within functional limits for tasks assessed     SENSATION: WFL  MUSCLE LENGTH: Very tight HS, calves and piriformis  POSTURE: rounded shoulders, forward head, and increased lumbar lordosis  PALPATION: Very tender and tight in the low back, left buttock and the left GT , mild tenderness in the left patellar tendon 03/08/24  significant tenderness  int he lumbar area and into the buttocks   LUMBAR ROM 03/09/23:  decreased 50% flexion, decreased 75% for extension with pain, and side bending with pain  LOWER EXTREMITY ROM:  Active ROM Left eval Left 03/22/24  Hip flexion 90 86 A  Hip extension 10   Hip abduction 10   Hip adduction    Hip internal rotation    Hip external rotation    Knee flexion 100   Knee extension 0   Ankle dorsiflexion    Ankle plantarflexion    Ankle inversion    Ankle eversion     (Blank rows = not tested)  LOWER EXTREMITY MMT:  MMT Right eval Left eval  Hip flexion 4 4  Hip extension 4 4  Hip abduction 4 4  Hip adduction    Hip internal rotation    Hip external rotation    Knee flexion  4  Knee extension  4  Ankle dorsiflexion    Ankle plantarflexion    Ankle inversion    Ankle eversion     (Blank rows = not tested)  LOWER EXTREMITY SPECIAL TESTS:  Hip special tests: Andy Bannister test: positive  and Piriformis test: positive  Knee special tests: Patellafemoral grind test: positive  SLR positive at 50 degrees Tried Manual pelvic distraction, some relief  FUNCTIONAL TESTS:  5 times sit to stand: 24 seconds 30 seconds chair stand test Timed up and go (TUG): 16 seconds  GAIT: Distance walked: 100 feet Assistive device utilized: None Level of assistance: Complete Independence Comments: mild antalgic gait on the left                                                                                                                                TREATMENT DATE:  03/24/24 Bike L 3.6 x6 min Lumbar ROM  Flex limited  25%, Ext WFL, R side bend WFL, L side bend WFL, R rotations WFL, WFL HS curls 25lb 2x12 Leg Ext 10lb 2x12 Seated Rows & Lats 25lb 2x10 S2S OHP yellow ball x10 MHP to lumbar spine   Bridges  SLR x10 each  Passive stretching to HS,piriformis, KTC, and double K2C   03/22/24 NuStep L5 x 6 min Shoulder Ext 5lb 2x10 HS curls 25lb 2x10 Leg Ext 5lb 2x10 Rows 10lb 2x10 MHP to  lumbar spine   Bridges  SLR x10 each  LE on pball bridges, oblq, K2C  Passive stretching to HS,piriformis,  KTC, and double [facility]   03/15/24 Nustep L5 Shoulder ext 5# 2x10 5# row 2x10 6in step up x8 each Green band pull apart x10 S2S with OH yellow press x10 HS,piriformis stretch, KTC  03/10/24 NuStep L5 x6 min Shoulder Ext 5lb 2x10 HS curls 20lb 2x10 Leg Ext 5lb 2x10  S2S with yellow ball OH press 2x10 Bridge  x10 MHP to Lumbar spine  Feet on ball   KTC x10  Bridge x10 Lumbar HP  HS, hip flex, piriformis stretch    03/08/24 Evaluation of the low back and hips  03/01/24 Nustep level 5 x 5 minutes Bike level 3.0 x 5 minutes 30lb resisted gait forward & backward x3 each Sit to stands 2x10  20lb resisted side steps x3 each Sit to stands 2x10  LLELAQ 3lb 2x10 LLE HS curls blue 2x10 MT, PROM with bilateral LE with end range holds  HS, piriformis, K2C, ITB   02/23/24 Nustep level 5 x 5 minutes Bike level 4 x 5 minutes Calf stretch LEg press 20# Leg curls 15# Feet on ball bridges, isometric abs Red tband clamshells Ball b/n knees bridges Passive stretch LE's  02/16/24 Bike level 3 x 6 minutes Calf stretch on slant board LEg press 20# 2x10 Feet on ball, K2C, rotation, bridges, isomteric abs Passive LE stretches Red tband clamshells SAQ with cues verbal and tactile for VMO  01/26/24 Evaluation    PATIENT EDUCATION:  Education details: POC/HEP Person educated: Patient Education method: Programmer, multimedia, Facilities manager, Verbal cues, and Handouts Education comprehension: verbalized understanding  HOME EXERCISE PROGRAM: Access Code: [email or web address] URL: [email or web address] Date: 01/26/2024 Prepared by: Cherylene Corrente  Exercises - Supine Piriformis Stretch Pulling Heel to Hip  - 1 x daily - 7 x weekly - 1 sets - 5 reps - 30 hold - Standing Bilateral Gastroc Stretch with Step  - 1 x daily - 7 x weekly - 1 sets - 5 reps - 30 hold - Seated Hamstring  Stretch with Chair  - 1 x daily - 7 x weekly - 1 sets - 5 reps - 30 hold  ASSESSMENT:  CLINICAL IMPRESSION: Patient enters doing well overall.  She has progressed decreasing her pain and increasing her lumbar ROM. She tolerated exercises well with no reports of pain. Increase reps tolerated with leg curls and extensions.Increase resistance tolerated with seated rows and lats.  She would benefit from further PT to further improve lumbar mobility increase pain free functional movements.    Patient is a 73 y.o. female who was seen today for physical therapy evaluation and treatment for left knee pain, left hip and low back pain.  Has some degenerative changes in the knee and the low back, feels that this issue started with lifting a heavy box.  She just finished prednisone and is feeling a little better.  She is very tight in the hips, HS and calves.     OBJECTIVE IMPAIRMENTS: Abnormal gait, cardiopulmonary status limiting activity, decreased activity tolerance, decreased balance, decreased endurance, decreased mobility, difficulty walking, decreased ROM, decreased strength, increased edema, increased muscle spasms, impaired flexibility, improper body mechanics, and pain.   REHAB POTENTIAL: Good  CLINICAL DECISION MAKING: Stable/uncomplicated  EVALUATION COMPLEXITY: Moderate   GOALS: Goals reviewed with patient? Yes  SHORT TERM GOALS: Target date: 03/14/24 Independent with initial HEP Baseline: Goal status: met 02/16/24  LONG TERM GOALS: Target date: 04/27/24  Independent with advanced HEP Baseline:  Goal status: INITIAL  2.  Understand posture and body mechanics Baseline:  Goal status: progressing  02/23/24  3.  Decrease pain by 50% Baseline:  Goal status: progressing 02/23/24  4.  Report abel to sit > 30 minutes and get up without pain Baseline:  Goal status: Met 03/22/24  5.  Report be able to sleep through the night without pain Baseline:  Goal status: Progressing 03/22/24 6.   Increase lumbar ROM 25% Met 03/24/24 7.  Decrease back pain 25% Met 03/24/24 30% 8.  Report be able to drive or sit > 1 hour without an increase of pain Progressing 03/24/24  PLAN:  PT FREQUENCY: 1-2x/week  PT DURATION: 12 weeks  PLANNED INTERVENTIONS: 97164- PT Re-evaluation, 97110-Therapeutic exercises, 97530- Therapeutic activity, 97112- Neuromuscular re-education, 97535- Self Care, 16109- Manual therapy, [phone]- Gait training, [phone]- Electrical stimulation (unattended), 97035- Ultrasound, 91478- Traction (mechanical), 97033- Ionotophoresis 4mg /ml Dexamethasone, Patient/Family education, Balance training, Stair training, Taping, Dry Needling, Joint mobilization, Cryotherapy, and Moist heat  PLAN FOR NEXT SESSION: start exercises, she is coming off the prednisone, see how she is doing, needs flexibility, add treatment to the low back and the hips, could try traction   Ollen Beverage, PTA 03/24/2024, 3:18 PM

## 2024-03-30 NOTE — Telephone Encounter (Signed)
 Patient seen by Pharm D in April for lipid management

## 2024-03-31 ENCOUNTER — Ambulatory Visit: Admitting: Physical Therapy

## 2024-03-31 ENCOUNTER — Encounter: Payer: Self-pay | Admitting: Physical Therapy

## 2024-03-31 DIAGNOSIS — R252 Cramp and spasm: Secondary | ICD-10-CM

## 2024-03-31 DIAGNOSIS — M542 Cervicalgia: Secondary | ICD-10-CM

## 2024-03-31 DIAGNOSIS — M5459 Other low back pain: Secondary | ICD-10-CM

## 2024-03-31 DIAGNOSIS — M25562 Pain in left knee: Secondary | ICD-10-CM

## 2024-03-31 DIAGNOSIS — R293 Abnormal posture: Secondary | ICD-10-CM

## 2024-03-31 DIAGNOSIS — M546 Pain in thoracic spine: Secondary | ICD-10-CM

## 2024-03-31 DIAGNOSIS — M25552 Pain in left hip: Secondary | ICD-10-CM

## 2024-03-31 DIAGNOSIS — M6281 Muscle weakness (generalized): Secondary | ICD-10-CM

## 2024-03-31 NOTE — Therapy (Signed)
 OUTPATIENT PHYSICAL THERAPY LOWER EXTREMITY TREATMENT   Patient Name: Margaret [name] MRN: [id number] DOB:[birth year], 73 y.o., female Today's Date: 03/31/2024  END OF SESSION:  PT End of Session - 03/31/24 1403     Visit Number 10    Date for PT Re-Evaluation 04/27/24    Authorization Type BC[employer]    PT Start Time 1358    PT Stop Time 1445    PT Time Calculation (min) 47 min    Activity Tolerance Patient tolerated treatment well    Behavior During Therapy [facility] for tasks assessed/performed             Past Medical History:  Diagnosis Date   Allergy    Anemia    CAD in native artery 11/27/2023   Colitis    Dyspareunia    Endometriosis    GERD (gastroesophageal reflux disease)    High cholesterol    Hx of abnormal Pap smear 1980's   Hypertension    Osteopenia    Primary hypertension 11/27/2013   Ulcer    Urinary incontinence    Past Surgical History:  Procedure Laterality Date   ABDOMINAL HYSTERECTOMY  09/11/2008   TVH/BSO--Dr. Colvin Dec with TVT   BILATERAL SALPINGOOPHORECTOMY     2009   BLADDER SUSPENSION  09/11/2008   Dr. Colvin Dec   CERVIX LESION DESTRUCTION  11/11/1978   hx abnormal pap--dysplasia   COLONOSCOPY WITH PROPOFOL  Left 10/25/2017   Procedure: COLONOSCOPY WITH PROPOFOL ;  Surgeon: Genell Ken, MD;  Location: [facility];  Service: Gastroenterology;  Laterality: Left;   Patient Active Problem List   Diagnosis Date Noted   Hyperlipidemia LDL goal <70 03/09/2024   CAD in native artery 11/27/2023   Gastroesophageal reflux disease 06/06/2020   Atypical chest pain 04/21/2020   Closed nondisplaced fracture of proximal phalanx of lesser toe of left foot 11/19/2017   GI bleed 10/23/2017   Hypokalemia 10/23/2017   Nausea vomiting and diarrhea 10/23/2017   Hematochezia 10/23/2017   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   Back pain 04/12/2014   Arthritis 04/12/2014   Knee pain 04/12/2014   Primary hypertension 11/27/2013   Osteoarthritis  of left knee 07/16/2012    PCP: Rossie Coon, MD  REFERRING PROVIDER: Jacqulyne Maxim, MD  REFERRING DIAG: knee pain  THERAPY DIAG:  Acute pain of left knee  Other low back pain  Muscle weakness (generalized)  Pain in left hip  Cervicalgia  Cramp and spasm  Pain in thoracic spine  Abnormal posture  Rationale for Evaluation and Treatment: Rehabilitation  ONSET DATE: 01/20/24  SUBJECTIVE:   SUBJECTIVE STATEMENT: I drove back from [place] , I am sore and stiff  Patient reports that she has had some left knee pain for a few months reports that she has been having left hip pain and the sciatic type pain with some pain in the back, feels like she lifted a heavy box a few months ago.  PERTINENT HISTORY: See above PAIN:  Are you having pain? Yes: NPRS scale: 3-4/10   Pain location: R calf Pain description: shooting, inconsistent  Aggravating factors: bending stepping over, stairs, sitting pain up to 8/10 Relieving factors: prednisone, movement, the prednisone has really helped now pain 2-3/10  PRECAUTIONS: None  RED FLAGS: None   WEIGHT BEARING RESTRICTIONS: No  FALLS:  Has patient fallen in last 6 months? No  LIVING ENVIRONMENT: Lives with: lives alone Lives in: House/apartment Stairs: No Has following equipment at home: None  OCCUPATION: retired  PLOF: Independent and goes to [employer], aerobics,  does yardwork and housework  PATIENT GOALS: have less pain  NEXT MD VISIT: May 2025  OBJECTIVE:  Note: Objective measures were completed at Evaluation unless otherwise noted.  DIAGNOSTIC FINDINGS: arthritic and degenerative changes  PATIENT SURVEYS:  Modified Oswestry 24/50 48%   COGNITION: Overall cognitive status: Within functional limits for tasks assessed     SENSATION: WFL  MUSCLE LENGTH: Very tight HS, calves and piriformis  POSTURE: rounded shoulders, forward head, and increased lumbar lordosis  PALPATION: Very tender and tight in the low back,  left buttock and the left GT , mild tenderness in the left patellar tendon 03/08/24  significant tenderness int he lumbar area and into the buttocks   LUMBAR ROM 03/09/23:  decreased 50% flexion, decreased 75% for extension with pain, and side bending with pain  LOWER EXTREMITY ROM:  Active ROM Left eval Left 03/22/24  Hip flexion 90 86 A  Hip extension 10   Hip abduction 10   Hip adduction    Hip internal rotation    Hip external rotation    Knee flexion 100   Knee extension 0   Ankle dorsiflexion    Ankle plantarflexion    Ankle inversion    Ankle eversion     (Blank rows = not tested)  LOWER EXTREMITY MMT:  MMT Right eval Left eval  Hip flexion 4 4  Hip extension 4 4  Hip abduction 4 4  Hip adduction    Hip internal rotation    Hip external rotation    Knee flexion  4  Knee extension  4  Ankle dorsiflexion    Ankle plantarflexion    Ankle inversion    Ankle eversion     (Blank rows = not tested)  LOWER EXTREMITY SPECIAL TESTS:  Hip special tests: Andy Bannister test: positive  and Piriformis test: positive  Knee special tests: Patellafemoral grind test: positive  SLR positive at 50 degrees Tried Manual pelvic distraction, some relief  FUNCTIONAL TESTS:  5 times sit to stand: 24 seconds 30 seconds chair stand test Timed up and go (TUG): 16 seconds  GAIT: Distance walked: 100 feet Assistive device utilized: None Level of assistance: Complete Independence Comments: mild antalgic gait on the left                                                                                                                                TREATMENT DATE:  03/31/24 Nustep level 5 x 6 minutes Seated rows 20# LAts 20# Straight arm pulls 5# AR press 10# Feet on ball K2C, rotation, small bridge, isometric abs Passive LE stretches Lumbar traction 55# static  03/24/24 Bike L 3.6 x6 min Lumbar ROM  Flex limited  25%, Ext WFL, R side bend WFL, L side bend WFL, R rotations WFL,  WFL HS curls 25lb 2x12 Leg Ext 10lb 2x12 Seated Rows & Lats 25lb 2x10 S2S OHP yellow ball x10 MHP to lumbar spine   Collene Dawson  SLR x10 each  Passive stretching to HS,piriformis, KTC, and double [facility]   03/22/24 NuStep L5 x 6 min Shoulder Ext 5lb 2x10 HS curls 25lb 2x10 Leg Ext 5lb 2x10 Rows 10lb 2x10 MHP to lumbar spine   Bridges  SLR x10 each  LE on pball bridges, oblq, K2C  Passive stretching to HS,piriformis, KTC, and double [facility]   03/15/24 Nustep L5 Shoulder ext 5# 2x10 5# row 2x10 6in step up x8 each Green band pull apart x10 S2S with OH yellow press x10 HS,piriformis stretch, KTC  03/10/24 NuStep L5 x6 min Shoulder Ext 5lb 2x10 HS curls 20lb 2x10 Leg Ext 5lb 2x10  S2S with yellow ball OH press 2x10 Bridge  x10 MHP to Lumbar spine  Feet on ball   KTC x10  Bridge x10 Lumbar HP  HS, hip flex, piriformis stretch    03/08/24 Evaluation of the low back and hips  03/01/24 Nustep level 5 x 5 minutes Bike level 3.0 x 5 minutes 30lb resisted gait forward & backward x3 each Sit to stands 2x10  20lb resisted side steps x3 each Sit to stands 2x10  LLELAQ 3lb 2x10 LLE HS curls blue 2x10 MT, PROM with bilateral LE with end range holds  HS, piriformis, K2C, ITB   02/23/24 Nustep level 5 x 5 minutes Bike level 4 x 5 minutes Calf stretch LEg press 20# Leg curls 15# Feet on ball bridges, isometric abs Red tband clamshells Ball b/n knees bridges Passive stretch LE's  02/16/24 Bike level 3 x 6 minutes Calf stretch on slant board LEg press 20# 2x10 Feet on ball, K2C, rotation, bridges, isomteric abs Passive LE stretches Red tband clamshells SAQ with cues verbal and tactile for VMO  01/26/24 Evaluation    PATIENT EDUCATION:  Education details: POC/HEP Person educated: Patient Education method: Programmer, multimedia, Facilities manager, Verbal cues, and Handouts Education comprehension: verbalized understanding  HOME EXERCISE PROGRAM: Access Code: [email or web address] URL:  [email or web address] Date: 01/26/2024 Prepared by: Cherylene Corrente  Exercises - Supine Piriformis Stretch Pulling Heel to Hip  - 1 x daily - 7 x weekly - 1 sets - 5 reps - 30 hold - Standing Bilateral Gastroc Stretch with Step  - 1 x daily - 7 x weekly - 1 sets - 5 reps - 30 hold - Seated Hamstring Stretch with Chair  - 1 x daily - 7 x weekly - 1 sets - 5 reps - 30 hold  ASSESSMENT:  CLINICAL IMPRESSION: Patient is sore and stiff she drove from [place]  over the weekend.  She reports that this was a little set back, we continued core exercises but added traction to see if this would help.  She would benefit from further PT to further improve lumbar mobility increase pain free functional movements.    Patient is a 73 y.o. female who was seen today for physical therapy evaluation and treatment for left knee pain, left hip and low back pain.  Has some degenerative changes in the knee and the low back, feels that this issue started with lifting a heavy box.  She just finished prednisone and is feeling a little better.  She is very tight in the hips, HS and calves.     OBJECTIVE IMPAIRMENTS: Abnormal gait, cardiopulmonary status limiting activity, decreased activity tolerance, decreased balance, decreased endurance, decreased mobility, difficulty walking, decreased ROM, decreased strength, increased edema, increased muscle spasms, impaired flexibility, improper body mechanics, and pain.   REHAB POTENTIAL: Good  CLINICAL DECISION MAKING: Stable/uncomplicated  EVALUATION COMPLEXITY: Moderate  GOALS: Goals reviewed with patient? Yes  SHORT TERM GOALS: Target date: 03/14/24 Independent with initial HEP Baseline: Goal status: met 02/16/24  LONG TERM GOALS: Target date: 04/27/24  Independent with advanced HEP Baseline:  Goal status: ongoing 03/31/24  2.  Understand posture and body mechanics Baseline:  Goal status: progressing 02/23/24  3.  Decrease pain by 50% Baseline:   Goal status: progressing 03/31/24  4.  Report abel to sit > 30 minutes and get up without pain Baseline:  Goal status: Met 03/22/24  5.  Report be able to sleep through the night without pain Baseline:  Goal status: Progressing 03/22/24 6.  Increase lumbar ROM 25% Met 03/24/24 7.  Decrease back pain 25% Met 03/24/24 30% 8.  Report be able to drive or sit > 1 hour without an increase of pain met 03/31/24  PLAN:  PT FREQUENCY: 1-2x/week  PT DURATION: 12 weeks  PLANNED INTERVENTIONS: 97164- PT Re-evaluation, 97110-Therapeutic exercises, 97530- Therapeutic activity, 97112- Neuromuscular re-education, 97535- Self Care, 40981- Manual therapy, [id number]- Gait training, [phone]- Electrical stimulation (unattended), 97035- Ultrasound, 82956- Traction (mechanical), [id number]- Ionotophoresis 4mg /ml Dexamethasone, Patient/Family education, Balance training, Stair training, Taping, Dry Needling, Joint mobilization, Cryotherapy, and Moist heat  PLAN FOR NEXT SESSION: see how traction did   Carollyn Etcheverry W, PT 03/31/2024, 2:04 PM

## 2024-04-07 ENCOUNTER — Ambulatory Visit: Admitting: Physical Therapy

## 2024-04-07 ENCOUNTER — Encounter: Payer: Self-pay | Admitting: Physical Therapy

## 2024-04-07 DIAGNOSIS — M25562 Pain in left knee: Secondary | ICD-10-CM

## 2024-04-07 DIAGNOSIS — M5459 Other low back pain: Secondary | ICD-10-CM

## 2024-04-07 DIAGNOSIS — M25552 Pain in left hip: Secondary | ICD-10-CM

## 2024-04-07 DIAGNOSIS — M6281 Muscle weakness (generalized): Secondary | ICD-10-CM

## 2024-04-07 NOTE — Therapy (Signed)
 OUTPATIENT PHYSICAL THERAPY LOWER EXTREMITY TREATMENT   Patient Name: Margaret [name] MRN: [id number] DOB:06-20-1951, 73 y.o., female Today's Date: 04/07/2024  END OF SESSION:  PT End of Session - 04/07/24 1439     Visit Number 11    Date for PT Re-Evaluation 04/27/24    PT Start Time 1439    PT Stop Time 1515    PT Time Calculation (min) 36 min    Activity Tolerance Patient tolerated treatment well    Behavior During Therapy [facility] for tasks assessed/performed             Past Medical History:  Diagnosis Date   Allergy    Anemia    CAD in native artery 11/27/2023   Colitis    Dyspareunia    Endometriosis    GERD (gastroesophageal reflux disease)    High cholesterol    Hx of abnormal Pap smear 1980's   Hypertension    Osteopenia    Primary hypertension 11/27/2013   Ulcer    Urinary incontinence    Past Surgical History:  Procedure Laterality Date   ABDOMINAL HYSTERECTOMY  09/11/2008   TVH/BSO--Dr. Colvin Dec with TVT   BILATERAL SALPINGOOPHORECTOMY     2009   BLADDER SUSPENSION  09/11/2008   Dr. Colvin Dec   CERVIX LESION DESTRUCTION  11/11/1978   hx abnormal pap--dysplasia   COLONOSCOPY WITH PROPOFOL  Left 10/25/2017   Procedure: COLONOSCOPY WITH PROPOFOL ;  Surgeon: Genell Ken, MD;  Location: [facility];  Service: Gastroenterology;  Laterality: Left;   Patient Active Problem List   Diagnosis Date Noted   Hyperlipidemia LDL goal <70 03/09/2024   CAD in native artery 11/27/2023   Gastroesophageal reflux disease 06/06/2020   Atypical chest pain 04/21/2020   Closed nondisplaced fracture of proximal phalanx of lesser toe of left foot 11/19/2017   GI bleed 10/23/2017   Hypokalemia 10/23/2017   Nausea vomiting and diarrhea 10/23/2017   Hematochezia 10/23/2017   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   Back pain 04/12/2014   Arthritis 04/12/2014   Knee pain 04/12/2014   Primary hypertension 11/27/2013   Osteoarthritis of left knee 07/16/2012     PCP: Rossie Coon, MD  REFERRING PROVIDER: Jacqulyne Maxim, MD  REFERRING DIAG: knee pain  THERAPY DIAG:  Acute pain of left knee  Other low back pain  Muscle weakness (generalized)  Pain in left hip  Rationale for Evaluation and Treatment: Rehabilitation  ONSET DATE: 01/20/24  SUBJECTIVE:   SUBJECTIVE STATEMENT: Im feeling good  Patient reports that she has had some left knee pain for a few months reports that she has been having left hip pain and the sciatic type pain with some pain in the back, feels like she lifted a heavy box a few months ago.  PERTINENT HISTORY: See above PAIN:  Are you having pain? Yes: NPRS scale: 0/10   Pain location: R calf Pain description: shooting, inconsistent  Aggravating factors: bending stepping over, stairs, sitting pain up to 8/10 Relieving factors: prednisone, movement, the prednisone has really helped now pain 2-3/10  PRECAUTIONS: None  RED FLAGS: None   WEIGHT BEARING RESTRICTIONS: No  FALLS:  Has patient fallen in last 6 months? No  LIVING ENVIRONMENT: Lives with: lives alone Lives in: House/apartment Stairs: No Has following equipment at home: None  OCCUPATION: retired  PLOF: Independent and goes to [employer], aerobics, does yardwork and housework  PATIENT GOALS: have less pain  NEXT MD VISIT: May 2025  OBJECTIVE:  Note: Objective measures were completed at Evaluation unless  otherwise noted.  DIAGNOSTIC FINDINGS: arthritic and degenerative changes  PATIENT SURVEYS:  Modified Oswestry 24/50 48%   COGNITION: Overall cognitive status: Within functional limits for tasks assessed     SENSATION: WFL  MUSCLE LENGTH: Very tight HS, calves and piriformis  POSTURE: rounded shoulders, forward head, and increased lumbar lordosis  PALPATION: Very tender and tight in the low back, left buttock and the left GT , mild tenderness in the left patellar tendon 03/08/24  significant tenderness int he lumbar area and into  the buttocks   LUMBAR ROM 03/09/23:  decreased 50% flexion, decreased 75% for extension with pain, and side bending with pain  LOWER EXTREMITY ROM:  Active ROM Left eval Left 03/22/24  Hip flexion 90 86 A  Hip extension 10   Hip abduction 10   Hip adduction    Hip internal rotation    Hip external rotation    Knee flexion 100   Knee extension 0   Ankle dorsiflexion    Ankle plantarflexion    Ankle inversion    Ankle eversion     (Blank rows = not tested)  LOWER EXTREMITY MMT:  MMT Right eval Left eval  Hip flexion 4 4  Hip extension 4 4  Hip abduction 4 4  Hip adduction    Hip internal rotation    Hip external rotation    Knee flexion  4  Knee extension  4  Ankle dorsiflexion    Ankle plantarflexion    Ankle inversion    Ankle eversion     (Blank rows = not tested)  LOWER EXTREMITY SPECIAL TESTS:  Hip special tests: Andy Bannister test: positive  and Piriformis test: positive  Knee special tests: Patellafemoral grind test: positive  SLR positive at 50 degrees Tried Manual pelvic distraction, some relief  FUNCTIONAL TESTS:  5 times sit to stand: 24 seconds 30 seconds chair stand test Timed up and go (TUG): 16 seconds  GAIT: Distance walked: 100 feet Assistive device utilized: None Level of assistance: Complete Independence Comments: mild antalgic gait on the left                                                                                                                                TREATMENT DATE:  04/07/24 NuStep L5 x 6 min HS curls 25lb 2x12 Leg Ext 10lb 2x12 Rows & Lats 20lb 2x10 Shoulder green 2x10 6in lateral step ups  Passive LE stretches  Hamstrings, piriformis, K2C  03/31/24 Nustep level 5 x 6 minutes Seated rows 20# LAts 20# Straight arm pulls 5# AR press 10# Feet on ball K2C, rotation, small bridge, isometric abs Passive LE stretches Lumbar traction 55# static  03/24/24 Bike L 3.6 x6 min Lumbar ROM  Flex limited  25%, Ext WFL, R  side bend WFL, L side bend WFL, R rotations WFL, WFL HS curls 25lb 2x12 Leg Ext 10lb 2x12 Seated Rows & Lats 25lb 2x10 S2S OHP yellow ball  x10 MHP to lumbar spine   Bridges  SLR x10 each  Passive stretching to HS,piriformis, KTC, and double [facility]   03/22/24 NuStep L5 x 6 min Shoulder Ext 5lb 2x10 HS curls 25lb 2x10 Leg Ext 5lb 2x10 Rows 10lb 2x10 MHP to lumbar spine   Bridges  SLR x10 each  LE on pball bridges, oblq, K2C  Passive stretching to HS,piriformis, KTC, and double [facility]   03/15/24 Nustep L5 Shoulder ext 5# 2x10 5# row 2x10 6in step up x8 each Green band pull apart x10 S2S with OH yellow press x10 HS,piriformis stretch, KTC  03/10/24 NuStep L5 x6 min Shoulder Ext 5lb 2x10 HS curls 20lb 2x10 Leg Ext 5lb 2x10  S2S with yellow ball OH press 2x10 Bridge  x10 MHP to Lumbar spine  Feet on ball   KTC x10  Bridge x10 Lumbar HP  HS, hip flex, piriformis stretch    03/08/24 Evaluation of the low back and hips  03/01/24 Nustep level 5 x 5 minutes Bike level 3.0 x 5 minutes 30lb resisted gait forward & backward x3 each Sit to stands 2x10  20lb resisted side steps x3 each Sit to stands 2x10  LLELAQ 3lb 2x10 LLE HS curls blue 2x10 MT, PROM with bilateral LE with end range holds  HS, piriformis, K2C, ITB   02/23/24 Nustep level 5 x 5 minutes Bike level 4 x 5 minutes Calf stretch LEg press 20# Leg curls 15# Feet on ball bridges, isometric abs Red tband clamshells Ball b/n knees bridges Passive stretch LE's  02/16/24 Bike level 3 x 6 minutes Calf stretch on slant board LEg press 20# 2x10 Feet on ball, K2C, rotation, bridges, isomteric abs Passive LE stretches Red tband clamshells SAQ with cues verbal and tactile for VMO  01/26/24 Evaluation    PATIENT EDUCATION:  Education details: POC/HEP Person educated: Patient Education method: Programmer, multimedia, Facilities manager, Verbal cues, and Handouts Education comprehension: verbalized understanding  HOME  EXERCISE PROGRAM: Access Code: [email or web address] URL: [email or web address] Date: 01/26/2024 Prepared by: Cherylene Corrente  Exercises - Supine Piriformis Stretch Pulling Heel to Hip  - 1 x daily - 7 x weekly - 1 sets - 5 reps - 30 hold - Standing Bilateral Gastroc Stretch with Step  - 1 x daily - 7 x weekly - 1 sets - 5 reps - 30 hold - Seated Hamstring Stretch with Chair  - 1 x daily - 7 x weekly - 1 sets - 5 reps - 30 hold  ASSESSMENT:  CLINICAL IMPRESSION: Pt ~ 10 min late, She reports that's he is feeling well today. Cue needed for full ROM needed with HS curls and extensions. Postural weakness noted with shoulder Ext. Bilateral HS tightness with passive stretching.  She would benefit from further PT to further improve lumbar mobility increase pain free functional movements.    Patient is a 73 y.o. female who was seen today for physical therapy evaluation and treatment for left knee pain, left hip and low back pain.  Has some degenerative changes in the knee and the low back, feels that this issue started with lifting a heavy box.  She just finished prednisone and is feeling a little better.  She is very tight in the hips, HS and calves.     OBJECTIVE IMPAIRMENTS: Abnormal gait, cardiopulmonary status limiting activity, decreased activity tolerance, decreased balance, decreased endurance, decreased mobility, difficulty walking, decreased ROM, decreased strength, increased edema, increased muscle spasms, impaired flexibility, improper body mechanics, and pain.   REHAB POTENTIAL: Good  CLINICAL DECISION MAKING: Stable/uncomplicated  EVALUATION COMPLEXITY: Moderate   GOALS: Goals reviewed with patient? Yes  SHORT TERM GOALS: Target date: 03/14/24 Independent with initial HEP Baseline: Goal status: met 02/16/24  LONG TERM GOALS: Target date: 04/27/24  Independent with advanced HEP Baseline:  Goal status: ongoing 03/31/24  2.  Understand posture and body mechanics Baseline:   Goal status: progressing 02/23/24  3.  Decrease pain by 50% Baseline:  Goal status: progressing 03/31/24  4.  Report abel to sit > 30 minutes and get up without pain Baseline:  Goal status: Met 03/22/24  5.  Report be able to sleep through the night without pain Baseline:  Goal status: Progressing 03/22/24 6.  Increase lumbar ROM 25% Met 03/24/24 7.  Decrease back pain 25% Met 03/24/24 30% 8.  Report be able to drive or sit > 1 hour without an increase of pain met 03/31/24  PLAN:  PT FREQUENCY: 1-2x/week  PT DURATION: 12 weeks  PLANNED INTERVENTIONS: 97164- PT Re-evaluation, 97110-Therapeutic exercises, 97530- Therapeutic activity, 97112- Neuromuscular re-education, 97535- Self Care, 02725- Manual therapy, [phone]- Gait training, [phone]- Electrical stimulation (unattended), 97035- Ultrasound, 25956- Traction (mechanical), [id number]- Ionotophoresis 4mg /ml Dexamethasone, Patient/Family education, Balance training, Stair training, Taping, Dry Needling, Joint mobilization, Cryotherapy, and Moist heat  PLAN FOR NEXT SESSION: see how traction did   Ollen Beverage, PTA 04/07/2024, 2:40 PM

## 2024-04-09 ENCOUNTER — Encounter (HOSPITAL_BASED_OUTPATIENT_CLINIC_OR_DEPARTMENT_OTHER): Payer: Medicare Other | Admitting: Cardiovascular Disease

## 2024-04-13 ENCOUNTER — Ambulatory Visit (INDEPENDENT_AMBULATORY_CARE_PROVIDER_SITE_OTHER): Admitting: Cardiovascular Disease

## 2024-04-13 ENCOUNTER — Telehealth (HOSPITAL_BASED_OUTPATIENT_CLINIC_OR_DEPARTMENT_OTHER): Payer: Self-pay | Admitting: Cardiovascular Disease

## 2024-04-13 ENCOUNTER — Encounter (HOSPITAL_BASED_OUTPATIENT_CLINIC_OR_DEPARTMENT_OTHER): Payer: Self-pay | Admitting: Cardiovascular Disease

## 2024-04-13 VITALS — BP 110/80 | HR 76 | Ht 66.5 in | Wt 175.0 lb

## 2024-04-13 DIAGNOSIS — Z5181 Encounter for therapeutic drug level monitoring: Secondary | ICD-10-CM | POA: Diagnosis not present

## 2024-04-13 DIAGNOSIS — I10 Essential (primary) hypertension: Secondary | ICD-10-CM

## 2024-04-13 DIAGNOSIS — I2510 Atherosclerotic heart disease of native coronary artery without angina pectoris: Secondary | ICD-10-CM

## 2024-04-13 DIAGNOSIS — E785 Hyperlipidemia, unspecified: Secondary | ICD-10-CM | POA: Diagnosis not present

## 2024-04-13 NOTE — Patient Instructions (Signed)
 Medication Instructions:  STOP VALSARTAN     Labwork: FASTING LP/CMET AROUND JULY 4TH   Testing/Procedures: NONE  Follow-Up: 1 YEAR WITH DR [place] OR CAITLIN W NP   Any Other Special Instructions Will Be Listed Below (If Applicable). MONITOR BLOOD PRESSURE DAILY AT HOME. SEND IN YOUR BLOOD PRESSURE READINGS IN COUPLE WEEKS TO LET US  KNOW HOW IT IS RUNNING   If you need a refill on your cardiac medications before your next appointment, please call your pharmacy.

## 2024-04-13 NOTE — Progress Notes (Signed)
 [facility] Initial Assessment:    Date:  04/13/2024   ID:  Margaret [name], DOB [birth year], MRN [id number]  PCP:  Bertrum Brodie, MD  Cardiologist:  Maudine Sos, MD   Referring MD: Bertrum Brodie, MD   CC: Hypertension  History of Present Illness:    Margaret [name] is a 73 y.o. female with a hx of non-obstructive CAD, hypertension and hyperlipidemia here to establish care in the [facility]. She previously saw Dr. Filiberto Hug and was thought to have atypical chest pain.  Her ETT in 2019 was negative for ischemia.  Coronary CT-A 10/2022 revealed a calcium  score of 0 and had mild blockage in the LAD.  At her visit 11/2023 she noted fluctuating blood pressures at home ranging from the 110s to 140s.  At the time she was on lisinopril /HCTZ.  Lisinopril  was switched to valsartan /HCTZ.  She was encouraged to increase her exercise.  She had stopped her atorvastatin due to concerns about potential side effects though she had not experienced any personally.  She preferred to have a "natural" approach.  She was encouraged to resume the atorvastatin given her nonobstructive coronary disease.  At her visit 12/2023 she was experiencing low blood pressures.  She was also feeling tired.  Hydrochlorothiazide  was reduced.  She has bilateral carpal tunnel which she felt was exacerbated by her statin.  Discussed the use of AI scribe software for clinical note transcription with the patient, who gave verbal consent to proceed.  History of Present Illness Margaret [name] is a 73 year old female with hypertension and hyperlipidemia who presents for medication management and follow-up.  She is currently on ezetimibe  daily and rosuvastatin  twice a week for hyperlipidemia. Initially, she experienced headaches, which she attributed to either the medication or lack of rest due to a busy schedule. Her previous muscle aches, suspected to be related to cholesterol  medication, have subsided.  For hypertension, she is taking valsartan  and hydrochlorothiazide  separately. She recalls previously being on a combination pill with a higher dosage. Her blood pressure has been stable, with systolic readings consistently under 130 and diastolic readings varying between 62 and 80. She monitors her blood pressure at home and notes some fluctuations, which she attributes to normal variation.  She underwent a carpal tunnel study, which showed mild carpal tunnel syndrome in her left hand, but her right hand was fine except for a tendon issue. She mentions a family history of heart disease but has not experienced any heart attacks or strokes herself. No lightheadedness or dizziness. Previous headaches and body aches have since resolved.  Previous antihypertensives:   Past Medical History:  Diagnosis Date   Allergy    Anemia    CAD in native artery 11/27/2023   Colitis    Dyspareunia    Endometriosis    GERD (gastroesophageal reflux disease)    High cholesterol    Hx of abnormal Pap smear 1980's   Hypertension    Osteopenia    Primary hypertension 11/27/2013   Ulcer    Urinary incontinence     Past Surgical History:  Procedure Laterality Date   ABDOMINAL HYSTERECTOMY  09/11/2008   TVH/BSO--Dr. Colvin Dec with TVT   BILATERAL SALPINGOOPHORECTOMY     2009   BLADDER SUSPENSION  09/11/2008   Dr. Colvin Dec   CERVIX LESION DESTRUCTION  11/11/1978   hx abnormal pap--dysplasia   COLONOSCOPY WITH PROPOFOL  Left 10/25/2017   Procedure: COLONOSCOPY WITH PROPOFOL ;  Surgeon: Genell Ken, MD;  Location: [facility];  Service: Gastroenterology;  Laterality: Left;    Current Medications: Current Meds  Medication Sig   Cholecalciferol  (VITAMIN D ) 2000 UNITS CAPS Take 6,000 Units by mouth daily.   diclofenac  Sodium (VOLTAREN ) 1 % GEL Apply 4 g topically 4 (four) times daily. (Patient taking differently: Apply 4 g topically as needed.)   ezetimibe  (ZETIA ) 10 MG tablet Take 1  tablet (10 mg total) by mouth daily.   Ferrous Sulfate (IRON) 325 (65 Fe) MG TABS 1 tablet Orally Once a day   fluticasone (FLONASE SENSIMIST) 27.5 MCG/SPRAY nasal spray 2-4 sprays (1-2 sprays in each nostril) Nasally Once a day for 30 days   fluticasone (FLONASE) 50 MCG/ACT nasal spray Place 1 spray into both nostrils daily as needed for allergies or rhinitis.   hydrochlorothiazide  (HYDRODIURIL ) 12.5 MG tablet Take 1 tablet (12.5 mg total) by mouth daily.   Omega-3 Fatty Acids (FISH OIL) 1000 MG CPDR Take by mouth.   rosuvastatin  (CRESTOR ) 5 MG tablet Take 1 tablet by mouth 2 to 3 days per week   valsartan  (DIOVAN ) 40 MG tablet Take 1 tablet (40 mg total) by mouth daily.     Allergies:   Amoxicillin -pot clavulanate, Ciprofloxacin, Levofloxacin, Levonorgestrel-ethinyl estrad, Macrobid [nitrofurantoin], Pantoprazole  sodium, and Sulfa antibiotics   Social History   Socioeconomic History   Marital status: Single    Spouse name: Not on file   Number of children: Not on file   Years of education: college   Highest education level: Not on file  Occupational History   Not on file  Tobacco Use   Smoking status: Never   Smokeless tobacco: Never  Vaping Use   Vaping status: Never Used  Substance and Sexual Activity   Alcohol use: Yes    Comment: rare   Drug use: No   Sexual activity: Not Currently    Birth control/protection: Surgical    Comment: TAH/BSO, more than 5, after 18, no STD, no abnormal pap, no DES  Other Topics Concern   Not on file  Social History Narrative   Not on file   Social Drivers of Health   Financial Resource Strain: Not on file  Food Insecurity: No Food Insecurity (12/19/2023)   Hunger Vital Sign    Worried About Running Out of Food in the Last Year: Never true    Ran Out of Food in the Last Year: Never true  Transportation Needs: No Transportation Needs (12/19/2023)   PRAPARE - Administrator, Civil Service (Medical): No    Lack of Transportation  (Non-Medical): No  Physical Activity: Inactive (11/27/2023)   Exercise Vital Sign    Days of Exercise per Week: 0 days    Minutes of Exercise per Session: 0 min  Stress: Not on file  Social Connections: Not on file     Family History: The patient's family history includes Arthritis in an other family member; Breast cancer in her cousin; Diabetes in her brother, brother, mother, and another family member; Hypertension in her brother and mother; Seizures in her brother; Thyroid  disease in her sister.  ROS:   Please see the history of present illness.     All other systems reviewed and are negative.  EKGs/Labs/Other Studies Reviewed:    EKG:  EKG is not ordered today.  Recent Labs: 08/13/2023: Hemoglobin 11.2; Platelets 252 10/15/2023: TSH 1.69 12/05/2023: ALT 9; BUN 14; Creatinine, Ser 0.70; Potassium 3.9; Sodium 140   Recent Lipid Panel    Component Value Date/Time   CHOL  156 12/05/2023 1059   TRIG 108 12/05/2023 1059   HDL 64 12/05/2023 1059   CHOLHDL 2.4 12/05/2023 1059   LDLCALC 73 12/05/2023 1059    Physical Exam:   VS:  BP 110/80 (BP Location: Left Arm, Cuff Size: Small)   Pulse 76   Ht 5' 6.5" (1.689 m)   Wt 175 lb (79.4 kg)   SpO2 96%   BMI 27.82 kg/m  , BMI Body mass index is 27.82 kg/m. GENERAL:  Well appearing HEENT: Pupils equal round and reactive, fundi not visualized, oral mucosa unremarkable NECK:  No jugular venous distention, waveform within normal limits, carotid upstroke brisk and symmetric, no bruits, no thyromegaly LUNGS:  Clear to auscultation bilaterally HEART:  RRR.  PMI not displaced or sustained,S1 and S2 within normal limits, no S3, no S4, no clicks, no rubs, no murmurs ABD:  Flat, positive bowel sounds normal in frequency in pitch, no bruits, no rebound, no guarding, no midline pulsatile mass, no hepatomegaly, no splenomegaly EXT:  2 plus pulses throughout, no edema, no cyanosis no clubbing SKIN:  No rashes no nodules NEURO:  Cranial nerves II  through XII grossly intact, motor grossly intact throughout PSYCH:  Cognitively intact, oriented to person place and time   ASSESSMENT/PLAN:    Assessment & Plan # CAD native:  # Hyperlipidemia: She has minimal non-obstructive CAD.  Cholesterol levels well-controlled rosuvastatin . She switched to ezetimibe  and low dose rosuvasatin. - Order lipid panel and comprehensive metabolic panel around July 4th - Continue ezetimibe  daily. - Continue rosuvastatin  twice a week.  # Hypertension Blood pressure well-controlled with valsartan  and hydrochlorothiazide . Plan to assess control on hydrochlorothiazide  alone. - Hold valsartan  for 2 weeks. - Continue hydrochlorothiazide . - Monitor blood pressure daily for 2 weeks.  BP goal <130/80.   - Send blood pressure readings via MyChart in 2 weeks.  Disposition:    FU with MD/PharmD in 1 month    Medication Adjustments/Labs and Tests Ordered: Current medicines are reviewed at length with the patient today.  Concerns regarding medicines are outlined above.  No orders of the defined types were placed in this encounter.  No orders of the defined types were placed in this encounter.   Signed, Maudine Sos, MD  04/13/2024 11:55 AM    [facility]

## 2024-04-13 NOTE — Telephone Encounter (Signed)
 Patient came into for visit and was inquiring about labs Margaret [name] wanted her to have in 2-3 months per AVS on 03/09/24. No lab orders are in please advise patient.

## 2024-04-14 ENCOUNTER — Ambulatory Visit: Attending: Sports Medicine | Admitting: Physical Therapy

## 2024-04-14 ENCOUNTER — Encounter: Payer: Self-pay | Admitting: Physical Therapy

## 2024-04-14 DIAGNOSIS — M542 Cervicalgia: Secondary | ICD-10-CM | POA: Insufficient documentation

## 2024-04-14 DIAGNOSIS — M25562 Pain in left knee: Secondary | ICD-10-CM

## 2024-04-14 DIAGNOSIS — M5459 Other low back pain: Secondary | ICD-10-CM

## 2024-04-14 DIAGNOSIS — M6281 Muscle weakness (generalized): Secondary | ICD-10-CM

## 2024-04-14 DIAGNOSIS — R252 Cramp and spasm: Secondary | ICD-10-CM | POA: Insufficient documentation

## 2024-04-14 DIAGNOSIS — M25552 Pain in left hip: Secondary | ICD-10-CM | POA: Diagnosis present

## 2024-04-14 NOTE — Therapy (Signed)
 OUTPATIENT PHYSICAL THERAPY LOWER EXTREMITY TREATMENT   Patient Name: Margaret [name] MRN: [id number] DOB:June 28, 1951, 73 y.o., female Today's Date: 04/14/2024  END OF SESSION:  PT End of Session - 04/14/24 1428     Visit Number 12    Date for PT Re-Evaluation 04/27/24    PT Start Time 1428    PT Stop Time 1510    PT Time Calculation (min) 42 min             Past Medical History:  Diagnosis Date   Allergy    Anemia    CAD in native artery 11/27/2023   Colitis    Dyspareunia    Endometriosis    GERD (gastroesophageal reflux disease)    High cholesterol    Hx of abnormal Pap smear 1980's   Hypertension    Osteopenia    Primary hypertension 11/27/2013   Ulcer    Urinary incontinence    Past Surgical History:  Procedure Laterality Date   ABDOMINAL HYSTERECTOMY  09/11/2008   TVH/BSO--Dr. Colvin Dec with TVT   BILATERAL SALPINGOOPHORECTOMY     2009   BLADDER SUSPENSION  09/11/2008   Dr. Colvin Dec   CERVIX LESION DESTRUCTION  11/11/1978   hx abnormal pap--dysplasia   COLONOSCOPY WITH PROPOFOL  Left 10/25/2017   Procedure: COLONOSCOPY WITH PROPOFOL ;  Surgeon: Genell Ken, MD;  Location: [facility];  Service: Gastroenterology;  Laterality: Left;   Patient Active Problem List   Diagnosis Date Noted   Hyperlipidemia LDL goal <70 03/09/2024   CAD in native artery 11/27/2023   Gastroesophageal reflux disease 06/06/2020   Atypical chest pain 04/21/2020   Closed nondisplaced fracture of proximal phalanx of lesser toe of left foot 11/19/2017   GI bleed 10/23/2017   Hypokalemia 10/23/2017   Nausea vomiting and diarrhea 10/23/2017   Hematochezia 10/23/2017   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   Back pain 04/12/2014   Arthritis 04/12/2014   Knee pain 04/12/2014   Primary hypertension 11/27/2013   Osteoarthritis of left knee 07/16/2012    PCP: Rossie Coon, MD  REFERRING PROVIDER: Jacqulyne Maxim, MD  REFERRING DIAG: knee pain  THERAPY DIAG:  Acute pain of  left knee  Other low back pain  Muscle weakness (generalized)  Pain in left hip  Rationale for Evaluation and Treatment: Rehabilitation  ONSET DATE: 01/20/24  SUBJECTIVE:   SUBJECTIVE STATEMENT: "Better" Spent a lot of tie driving over the weekend, lower back is hurting some   Patient reports that she has had some left knee pain for a few months reports that she has been having left hip pain and the sciatic type pain with some pain in the back, feels like she lifted a heavy box a few months ago.  PERTINENT HISTORY: See above PAIN:  Are you having pain? Yes: NPRS scale: 4/10   Pain location: R calf Pain description: shooting, inconsistent  Aggravating factors: bending stepping over, stairs, sitting pain up to 8/10 Relieving factors: prednisone, movement, the prednisone has really helped now pain 2-3/10  PRECAUTIONS: None  RED FLAGS: None   WEIGHT BEARING RESTRICTIONS: No  FALLS:  Has patient fallen in last 6 months? No  LIVING ENVIRONMENT: Lives with: lives alone Lives in: House/apartment Stairs: No Has following equipment at home: None  OCCUPATION: retired  PLOF: Independent and goes to [employer], aerobics, does yardwork and housework  PATIENT GOALS: have less pain  NEXT MD VISIT: May 2025  OBJECTIVE:  Note: Objective measures were completed at Evaluation unless otherwise noted.  DIAGNOSTIC FINDINGS: arthritic  and degenerative changes  PATIENT SURVEYS:  Modified Oswestry 24/50 48%   COGNITION: Overall cognitive status: Within functional limits for tasks assessed     SENSATION: WFL  MUSCLE LENGTH: Very tight HS, calves and piriformis  POSTURE: rounded shoulders, forward head, and increased lumbar lordosis  PALPATION: Very tender and tight in the low back, left buttock and the left GT , mild tenderness in the left patellar tendon 03/08/24  significant tenderness int he lumbar area and into the buttocks   LUMBAR ROM 03/09/23:  decreased 50%  flexion, decreased 75% for extension with pain, and side bending with pain  LOWER EXTREMITY ROM:  Active ROM Left eval Left 03/22/24  Hip flexion 90 86 A  Hip extension 10   Hip abduction 10   Hip adduction    Hip internal rotation    Hip external rotation    Knee flexion 100   Knee extension 0   Ankle dorsiflexion    Ankle plantarflexion    Ankle inversion    Ankle eversion     (Blank rows = not tested)  LOWER EXTREMITY MMT:  MMT Right eval Left eval  Hip flexion 4 4  Hip extension 4 4  Hip abduction 4 4  Hip adduction    Hip internal rotation    Hip external rotation    Knee flexion  4  Knee extension  4  Ankle dorsiflexion    Ankle plantarflexion    Ankle inversion    Ankle eversion     (Blank rows = not tested)  LOWER EXTREMITY SPECIAL TESTS:  Hip special tests: Andy Bannister test: positive  and Piriformis test: positive  Knee special tests: Patellafemoral grind test: positive  SLR positive at 50 degrees Tried Manual pelvic distraction, some relief  FUNCTIONAL TESTS:  5 times sit to stand: 24 seconds 30 seconds chair stand test Timed up and go (TUG): 16 seconds  GAIT: Distance walked: 100 feet Assistive device utilized: None Level of assistance: Complete Independence Comments: mild antalgic gait on the left                                                                                                                                TREATMENT DATE:  04/14/24 NuStep L5 x 6 min Shoulder Ext 5lb 2x10 HS curls 25lb 2x12 Leg Ext 10lb 2x12 Hip Ext 5lb 2x10 Rows & Lats 20lb 2x10 Passive LE stretches  Hamstrings, piriformis, K2C, ITB   04/07/24 NuStep L5 x 6 min HS curls 25lb 2x12 Leg Ext 10lb 2x12 Rows & Lats 20lb 2x10 Shoulder green 2x10 6in lateral step ups  Passive LE stretches  Hamstrings, piriformis, K2C  03/31/24 Nustep level 5 x 6 minutes Seated rows 20# LAts 20# Straight arm pulls 5# AR press 10# Feet on ball K2C, rotation, small bridge,  isometric abs Passive LE stretches Lumbar traction 55# static  03/24/24 Bike L 3.6 x6 min Lumbar ROM  Flex limited  25%, Ext  WFL, R side bend WFL, L side bend WFL, R rotations WFL, WFL HS curls 25lb 2x12 Leg Ext 10lb 2x12 Seated Rows & Lats 25lb 2x10 S2S OHP yellow ball x10 MHP to lumbar spine   Bridges  SLR x10 each  Passive stretching to HS,piriformis, KTC, and double K2C   03/22/24 NuStep L5 x 6 min Shoulder Ext 5lb 2x10 HS curls 25lb 2x10 Leg Ext 5lb 2x10 Rows 10lb 2x10 MHP to lumbar spine   Bridges  SLR x10 each  LE on pball bridges, oblq, K2C  Passive stretching to HS,piriformis, KTC, and double [facility]   03/15/24 Nustep L5 Shoulder ext 5# 2x10 5# row 2x10 6in step up x8 each Green band pull apart x10 S2S with OH yellow press x10 HS,piriformis stretch, KTC  03/10/24 NuStep L5 x6 min Shoulder Ext 5lb 2x10 HS curls 20lb 2x10 Leg Ext 5lb 2x10  S2S with yellow ball OH press 2x10 Bridge  x10 MHP to Lumbar spine  Feet on ball   KTC x10  Bridge x10 Lumbar HP  HS, hip flex, piriformis stretch    03/08/24 Evaluation of the low back and hips   PATIENT EDUCATION:  Education details: POC/HEP Person educated: Patient Education method: Programmer, multimedia, Demonstration, Verbal cues, and Handouts Education comprehension: verbalized understanding  HOME EXERCISE PROGRAM: Access Code: [email or web address] URL: [email or web address] Date: 01/26/2024 Prepared by: Cherylene Corrente  Exercises - Supine Piriformis Stretch Pulling Heel to Hip  - 1 x daily - 7 x weekly - 1 sets - 5 reps - 30 hold - Standing Bilateral Gastroc Stretch with Step  - 1 x daily - 7 x weekly - 1 sets - 5 reps - 30 hold - Seated Hamstring Stretch with Chair  - 1 x daily - 7 x weekly - 1 sets - 5 reps - 30 hold  ASSESSMENT:  CLINICAL IMPRESSION: Enters doing well, pt has progressed meeting some LTG's.  Cue needed for full ROM needed with HS curls and extensions. Some lo back discomfort from  driving over the weekend. Postural weakness noted with shoulder Ext. Bilateral HS tightness with passive stretching. Cues to maintain erect posture needed with hip ext.  She would benefit from further PT to further improve lumbar mobility increase pain free functional movements.    Patient is a 73 y.o. female who was seen today for physical therapy evaluation and treatment for left knee pain, left hip and low back pain.  Has some degenerative changes in the knee and the low back, feels that this issue started with lifting a heavy box.  She just finished prednisone and is feeling a little better.  She is very tight in the hips, HS and calves.     OBJECTIVE IMPAIRMENTS: Abnormal gait, cardiopulmonary status limiting activity, decreased activity tolerance, decreased balance, decreased endurance, decreased mobility, difficulty walking, decreased ROM, decreased strength, increased edema, increased muscle spasms, impaired flexibility, improper body mechanics, and pain.   REHAB POTENTIAL: Good  CLINICAL DECISION MAKING: Stable/uncomplicated  EVALUATION COMPLEXITY: Moderate   GOALS: Goals reviewed with patient? Yes  SHORT TERM GOALS: Target date: 03/14/24 Independent with initial HEP Baseline: Goal status: met 02/16/24  LONG TERM GOALS: Target date: 04/27/24  Independent with advanced HEP Baseline:  Goal status: ongoing 03/31/24  2.  Understand posture and body mechanics Baseline:  Goal status: progressing 02/23/24, Met 04/14/24  3.  Decrease pain by 50% Baseline:  Goal status: progressing 03/31/24, Met 70% 04/14/24  4.  Report abel to sit > 30 minutes and get up without  pain Baseline:  Goal status: Met 03/22/24  5.  Report be able to sleep through the night without pain Baseline:  Goal status: Progressing 03/22/24 6.  Increase lumbar ROM 25% Met 03/24/24 7.  Decrease back pain 25% Met 03/24/24 30% 8.  Report be able to drive or sit > 1 hour without an increase of pain  met  03/31/24  PLAN:  PT FREQUENCY: 1-2x/week  PT DURATION: 12 weeks  PLANNED INTERVENTIONS: 97164- PT Re-evaluation, 97110-Therapeutic exercises, 97530- Therapeutic activity, 97112- Neuromuscular re-education, 97535- Self Care, 16109- Manual therapy, [phone]- Gait training, [phone]- Electrical stimulation (unattended), 97035- Ultrasound, 91478- Traction (mechanical), [id number]- Ionotophoresis 4mg /ml Dexamethasone, Patient/Family education, Balance training, Stair training, Taping, Dry Needling, Joint mobilization, Cryotherapy, and Moist heat  PLAN FOR NEXT SESSION: see how traction did   Ollen Beverage, PTA 04/14/2024, 2:29 PM

## 2024-04-15 NOTE — Telephone Encounter (Signed)
 Spoke with patient.  Confirmed labs to be ordered (lipid/liver) for July.  Dr. Theodis Fiscal ordered lipid and CMP to be done.  Will review when labs drawn.  Patient voiced understanding

## 2024-04-21 ENCOUNTER — Encounter: Payer: Self-pay | Admitting: Physical Therapy

## 2024-04-21 ENCOUNTER — Ambulatory Visit: Admitting: Physical Therapy

## 2024-04-21 DIAGNOSIS — M5459 Other low back pain: Secondary | ICD-10-CM

## 2024-04-21 DIAGNOSIS — M25562 Pain in left knee: Secondary | ICD-10-CM

## 2024-04-21 DIAGNOSIS — M542 Cervicalgia: Secondary | ICD-10-CM

## 2024-04-21 DIAGNOSIS — M25552 Pain in left hip: Secondary | ICD-10-CM

## 2024-04-21 DIAGNOSIS — M6281 Muscle weakness (generalized): Secondary | ICD-10-CM

## 2024-04-21 DIAGNOSIS — R252 Cramp and spasm: Secondary | ICD-10-CM

## 2024-04-21 NOTE — Therapy (Signed)
 OUTPATIENT PHYSICAL THERAPY LOWER EXTREMITY TREATMENT   Patient Name: Margaret [name] MRN: [id number] DOB:[birth year], 73 y.o., female Today's Date: 04/21/2024  END OF SESSION:  PT End of Session - 04/21/24 1525     Visit Number 13    Date for PT Re-Evaluation 04/27/24    Authorization Type [employer]    PT Start Time 1525    PT Stop Time 1610    PT Time Calculation (min) 45 min    Activity Tolerance Patient tolerated treatment well    Behavior During Therapy [facility] for tasks assessed/performed             Past Medical History:  Diagnosis Date   Allergy    Anemia    CAD in native artery 11/27/2023   Colitis    Dyspareunia    Endometriosis    GERD (gastroesophageal reflux disease)    High cholesterol    Hx of abnormal Pap smear 1980's   Hypertension    Osteopenia    Primary hypertension 11/27/2013   Ulcer    Urinary incontinence    Past Surgical History:  Procedure Laterality Date   ABDOMINAL HYSTERECTOMY  09/11/2008   TVH/BSO--Dr. Colvin Dec with TVT   BILATERAL SALPINGOOPHORECTOMY     2009   BLADDER SUSPENSION  09/11/2008   Dr. Colvin Dec   CERVIX LESION DESTRUCTION  11/11/1978   hx abnormal pap--dysplasia   COLONOSCOPY WITH PROPOFOL  Left 10/25/2017   Procedure: COLONOSCOPY WITH PROPOFOL ;  Surgeon: Genell Ken, MD;  Location: [facility];  Service: Gastroenterology;  Laterality: Left;   Patient Active Problem List   Diagnosis Date Noted   Hyperlipidemia LDL goal <70 03/09/2024   CAD in native artery 11/27/2023   Gastroesophageal reflux disease 06/06/2020   Atypical chest pain 04/21/2020   Closed nondisplaced fracture of proximal phalanx of lesser toe of left foot 11/19/2017   GI bleed 10/23/2017   Hypokalemia 10/23/2017   Nausea vomiting and diarrhea 10/23/2017   Hematochezia 10/23/2017   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   Back pain 04/12/2014   Arthritis 04/12/2014   Knee pain 04/12/2014   Primary hypertension 11/27/2013   Osteoarthritis  of left knee 07/16/2012    PCP: Rossie Coon, MD  REFERRING PROVIDER: Jacqulyne Maxim, MD  REFERRING DIAG: knee pain  THERAPY DIAG:  Acute pain of left knee  Other low back pain  Muscle weakness (generalized)  Cervicalgia  Pain in left hip  Cramp and spasm  Rationale for Evaluation and Treatment: Rehabilitation  ONSET DATE: 01/20/24  SUBJECTIVE:   SUBJECTIVE STATEMENT: I am doing pretty good in the back and the hips and the knees , she reports that she shut the car door on her hand on Monday and it is really hurting , cannot do much with gripping  Patient reports that she has had some left knee pain for a few months reports that she has been having left hip pain and the sciatic type pain with some pain in the back, feels like she lifted a heavy box a few months ago.  PERTINENT HISTORY: See above PAIN:  Are you having pain? Yes: NPRS scale: 4/10   Pain location: R calf Pain description: shooting, inconsistent  Aggravating factors: bending stepping over, stairs, sitting pain up to 8/10 Relieving factors: prednisone, movement, the prednisone has really helped now pain 2-3/10  PRECAUTIONS: None  RED FLAGS: None   WEIGHT BEARING RESTRICTIONS: No  FALLS:  Has patient fallen in last 6 months? No  LIVING ENVIRONMENT: Lives with: lives alone Lives in:  House/apartment Stairs: No Has following equipment at home: None  OCCUPATION: retired  PLOF: Independent and goes to [employer], aerobics, does yardwork and housework  PATIENT GOALS: have less pain  NEXT MD VISIT: May 2025  OBJECTIVE:  Note: Objective measures were completed at Evaluation unless otherwise noted.  DIAGNOSTIC FINDINGS: arthritic and degenerative changes  PATIENT SURVEYS:  Modified Oswestry 24/50 48%   COGNITION: Overall cognitive status: Within functional limits for tasks assessed     SENSATION: WFL  MUSCLE LENGTH: Very tight HS, calves and piriformis  POSTURE: rounded shoulders, forward  head, and increased lumbar lordosis  PALPATION: Very tender and tight in the low back, left buttock and the left GT , mild tenderness in the left patellar tendon 03/08/24  significant tenderness int he lumbar area and into the buttocks   LUMBAR ROM 03/09/23:  decreased 50% flexion, decreased 75% for extension with pain, and side bending with pain  LOWER EXTREMITY ROM:  Active ROM Left eval Left 03/22/24  Hip flexion 90 86 A  Hip extension 10   Hip abduction 10   Hip adduction    Hip internal rotation    Hip external rotation    Knee flexion 100   Knee extension 0   Ankle dorsiflexion    Ankle plantarflexion    Ankle inversion    Ankle eversion     (Blank rows = not tested)  LOWER EXTREMITY MMT:  MMT Right eval Left eval  Hip flexion 4 4  Hip extension 4 4  Hip abduction 4 4  Hip adduction    Hip internal rotation    Hip external rotation    Knee flexion  4  Knee extension  4  Ankle dorsiflexion    Ankle plantarflexion    Ankle inversion    Ankle eversion     (Blank rows = not tested)  LOWER EXTREMITY SPECIAL TESTS:  Hip special tests: Andy Bannister test: positive  and Piriformis test: positive  Knee special tests: Patellafemoral grind test: positive  SLR positive at 50 degrees Tried Manual pelvic distraction, some relief  FUNCTIONAL TESTS:  5 times sit to stand: 24 seconds 30 seconds chair stand test Timed up and go (TUG): 16 seconds  GAIT: Distance walked: 100 feet Assistive device utilized: None Level of assistance: Complete Independence Comments: mild antalgic gait on the left                                                                                                                                TREATMENT DATE:  04/21/24 Nustep level 5 x 6 minutes 25# HS curls 2x10 Leg extension 10# 2x10 Supine feet on ball K2C, rotation, bridges, isometric abs Passive LE stretches Calve stretches Static lumbar traction45#  04/14/24 NuStep L5 x 6 min Shoulder  Ext 5lb 2x10 HS curls 25lb 2x12 Leg Ext 10lb 2x12 Hip Ext 5lb 2x10 Rows & Lats 20lb 2x10 Passive LE stretches  Hamstrings, piriformis, K2C, ITB  04/07/24 NuStep L5 x 6 min HS curls 25lb 2x12 Leg Ext 10lb 2x12 Rows & Lats 20lb 2x10 Shoulder green 2x10 6in lateral step ups  Passive LE stretches  Hamstrings, piriformis, K2C  03/31/24 Nustep level 5 x 6 minutes Seated rows 20# LAts 20# Straight arm pulls 5# AR press 10# Feet on ball K2C, rotation, small bridge, isometric abs Passive LE stretches Lumbar traction 55# static  03/24/24 Bike L 3.6 x6 min Lumbar ROM  Flex limited  25%, Ext WFL, R side bend WFL, L side bend WFL, R rotations WFL, WFL HS curls 25lb 2x12 Leg Ext 10lb 2x12 Seated Rows & Lats 25lb 2x10 S2S OHP yellow ball x10 MHP to lumbar spine   Bridges  SLR x10 each  Passive stretching to HS,piriformis, KTC, and double K2C   03/22/24 NuStep L5 x 6 min Shoulder Ext 5lb 2x10 HS curls 25lb 2x10 Leg Ext 5lb 2x10 Rows 10lb 2x10 MHP to lumbar spine   Bridges  SLR x10 each  LE on pball bridges, oblq, K2C  Passive stretching to HS,piriformis, KTC, and double [facility]   03/15/24 Nustep L5 Shoulder ext 5# 2x10 5# row 2x10 6in step up x8 each Green band pull apart x10 S2S with OH yellow press x10 HS,piriformis stretch, KTC  03/10/24 NuStep L5 x6 min Shoulder Ext 5lb 2x10 HS curls 20lb 2x10 Leg Ext 5lb 2x10  S2S with yellow ball OH press 2x10 Bridge  x10 MHP to Lumbar spine  Feet on ball   KTC x10  Bridge x10 Lumbar HP  HS, hip flex, piriformis stretch    03/08/24 Evaluation of the low back and hips   PATIENT EDUCATION:  Education details: POC/HEP Person educated: Patient Education method: Programmer, multimedia, Demonstration, Verbal cues, and Handouts Education comprehension: verbalized understanding  HOME EXERCISE PROGRAM: Access Code: [email or web address] URL: [email or web address] Date: 01/26/2024 Prepared by: Cherylene Corrente  Exercises -  Supine Piriformis Stretch Pulling Heel to Hip  - 1 x daily - 7 x weekly - 1 sets - 5 reps - 30 hold - Standing Bilateral Gastroc Stretch with Step  - 1 x daily - 7 x weekly - 1 sets - 5 reps - 30 hold - Seated Hamstring Stretch with Chair  - 1 x daily - 7 x weekly - 1 sets - 5 reps - 30 hold  ASSESSMENT:  CLINICAL IMPRESSION: Patient reports that she hurt her hand on Monday and cannot grip well, we avoided the exercises with her arms, tried to continue to work on core and she is very tight in the HS and calves.  Cues to maintain erect posture needed with hip ext.  She would benefit from further PT to further improve lumbar mobility increase pain free functional movements.    Patient is a 73 y.o. female who was seen today for physical therapy evaluation and treatment for left knee pain, left hip and low back pain.  Has some degenerative changes in the knee and the low back, feels that this issue started with lifting a heavy box.  She just finished prednisone and is feeling a little better.  She is very tight in the hips, HS and calves.     OBJECTIVE IMPAIRMENTS: Abnormal gait, cardiopulmonary status limiting activity, decreased activity tolerance, decreased balance, decreased endurance, decreased mobility, difficulty walking, decreased ROM, decreased strength, increased edema, increased muscle spasms, impaired flexibility, improper body mechanics, and pain.   REHAB POTENTIAL: Good  CLINICAL DECISION MAKING: Stable/uncomplicated  EVALUATION COMPLEXITY: Moderate   GOALS: Goals reviewed with patient?  Yes  SHORT TERM GOALS: Target date: 03/14/24 Independent with initial HEP Baseline: Goal status: met 02/16/24  LONG TERM GOALS: Target date: 04/27/24  Independent with advanced HEP Baseline:  Goal status: progressing   2.  Understand posture and body mechanics Baseline:  Goal status: progressing 02/23/24, Met 04/14/24  3.  Decrease pain by 50% Baseline:  Goal status: progressing 03/31/24, Met  70% 04/14/24  4.  Report abel to sit > 30 minutes and get up without pain Baseline:  Goal status: Met 03/22/24  5.  Report be able to sleep through the night without pain Baseline:  Goal status: Progressing 03/22/24 6.  Increase lumbar ROM 25% Met 03/24/24 7.  Decrease back pain 25% Met 03/24/24 30% 8.  Report be able to drive or sit > 1 hour without an increase of pain  met 03/31/24  PLAN:  PT FREQUENCY: 1-2x/week  PT DURATION: 12 weeks  PLANNED INTERVENTIONS: 97164- PT Re-evaluation, 97110-Therapeutic exercises, 97530- Therapeutic activity, 97112- Neuromuscular re-education, 97535- Self Care, 78295- Manual therapy, [id number]- Gait training, [phone]- Electrical stimulation (unattended), 97035- Ultrasound, 86578- Traction (mechanical), [id number]- Ionotophoresis 4mg /ml Dexamethasone, Patient/Family education, Balance training, Stair training, Taping, Dry Needling, Joint mobilization, Cryotherapy, and Moist heat  PLAN FOR NEXT SESSION: may look at D/C next week, assure HEP   Esmeralda Blanford W, PT 04/21/2024, 3:26 PM

## 2024-04-22 ENCOUNTER — Encounter: Payer: Self-pay | Admitting: Dermatology

## 2024-04-22 ENCOUNTER — Ambulatory Visit: Payer: Medicare Other | Admitting: Dermatology

## 2024-04-22 VITALS — BP 124/74

## 2024-04-22 DIAGNOSIS — L6681 Central centrifugal cicatricial alopecia: Secondary | ICD-10-CM | POA: Diagnosis not present

## 2024-04-22 DIAGNOSIS — L659 Nonscarring hair loss, unspecified: Secondary | ICD-10-CM

## 2024-04-22 MED ORDER — SAFETY SEAL MISCELLANEOUS MISC: 5 refills | Status: DC

## 2024-04-22 NOTE — Patient Instructions (Addendum)
 Date: Thu Apr 22 2024  Hello Margaret [name],  Thank you for visiting today. Here is a summary of the key instructions:  - Medications: Apply a thin layer of compounded topical medication (minoxidil, finasteride, and clobetasol) every morning   - This will help stimulate hair growth and control inflammation   - It will take 4 months to start seeing improvements  - Supplements:   - Continue taking vitamin D  and B vitamins   - Add vital proteins (collagen supplement) to support hair growth   - Take one scoop daily  - Hair Care:   - Avoid tight braids and sew-ins   - Ensure you can apply the topical medication daily  - Follow-up: Return for a follow-up appointment in 4 months  Please reach out if you have any questions or concerns.  Warm regards,  Dr. Louana Roup, Dermatology            Important Information  Due to recent changes in healthcare laws, you may see results of your pathology and/or laboratory studies on MyChart before the doctors have had a chance to review them. We understand that in some cases there may be results that are confusing or concerning to you. Please understand that not all results are received at the same time and often the doctors may need to interpret multiple results in order to provide you with the best plan of care or course of treatment. Therefore, we ask that you please give us  2 business days to thoroughly review all your results before contacting the office for clarification. Should we see a critical lab result, you will be contacted sooner.   If You Need Anything After Your Visit  If you have any questions or concerns for your doctor, please call our main line at [phone] If no one answers, please leave a voicemail as directed and we will return your call as soon as possible. Messages left after 4 pm will be answered the following business day.   You may also send us  a message via MyChart. We typically respond to MyChart messages within 1-2  business days.  For prescription refills, please ask your pharmacy to contact our office. Our fax number is [phone].  If you have an urgent issue when the clinic is closed that cannot wait until the next business day, you can page your doctor at the number below.    Please note that while we do our best to be available for urgent issues outside of office hours, we are not available 24/7.   If you have an urgent issue and are unable to reach us , you may choose to seek medical care at your doctor's office, retail clinic, urgent care center, or emergency room.  If you have a medical emergency, please immediately call 911 or go to the emergency department. In the event of inclement weather, please call our main line at [phone] for an update on the status of any delays or closures.  Dermatology Medication Tips: Please keep the boxes that topical medications come in in order to help keep track of the instructions about where and how to use these. Pharmacies typically print the medication instructions only on the boxes and not directly on the medication tubes.   If your medication is too expensive, please contact our office at [phone] or send us  a message through MyChart.   We are unable to tell what your co-pay for medications will be in advance as this is different depending on your insurance coverage.  However, we may be able to find a substitute medication at lower cost or fill out paperwork to get insurance to cover a needed medication.   If a prior authorization is required to get your medication covered by your insurance company, please allow us  1-2 business days to complete this process.  Drug prices often vary depending on where the prescription is filled and some pharmacies may offer cheaper prices.  The website www.goodrx.com contains coupons for medications through different pharmacies. The prices here do not account for what the cost may be with help from insurance (it may  be cheaper with your insurance), but the website can give you the price if you did not use any insurance.  - You can print the associated coupon and take it with your prescription to the pharmacy.  - You may also stop by our office during regular business hours and pick up a GoodRx coupon card.  - If you need your prescription sent electronically to a different pharmacy, notify our office through [facility] or by phone at [phone]

## 2024-04-22 NOTE — Progress Notes (Signed)
   New Patient Visit   Subjective  Margaret [name] is a 73 y.o. female who presents for the following: New Pt - Alopecia - CCCA  Patient states she has hair loss located at the scalp that she would like to have examined. Patient reports the areas have gotten worse 2 years. She reports the scalp is tender and itchy, rated 6 out of 10. Patient reports she has previously been treated for these areas by dermatologist Dr. Milissa & Dr. Mollie where steroid injections were done. Denied Hx of bx. Denied family history of skin cancer(s) but mentioned that all siblings female and female are dealing with hair loss issues.   The following portions of the chart were reviewed this encounter and updated as appropriate: medications, allergies, medical history  Review of Systems:  No other skin or systemic complaints except as noted in HPI or Assessment and Plan.  Objective  Well appearing patient in no apparent distress; mood and affect are within normal limits.   A focused examination was performed of the following areas: scalp   Relevant exam findings are noted in the Assessment and Plan.               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 Significant hair thinning noted in the vertex of the scalp, with hyper and hypopigmentation present. Previously received cortisone injections and currently using topical fluocinonide. Was prescribed oral dutasteride by another dermatologist but expressed hesitation due to concerns about interactions with high blood pressure medication and statins.  - Plan:    Prescribe compounded topical solution containing minoxidil, finasteride, and clobetasol     - Apply a thin layer every morning     - Pharmacy will contact patient for delivery    Recommend Vital Proteins collagen supplement      - Take one scoop daily    Continue vitamin D  and B supplements    Advise against tight braids and sew-ins to ensure daily application of topical solution    Follow-up in 4 months to assess treatment efficacy    Consider resuming injections if insufficient growth is observed with topical treatment   ALOPECIA    Return in about 4 months (around 08/22/2024) for CCCA/androgenetic alopecia.   Documentation: I have reviewed the above documentation for accuracy and completeness, and I agree with the above.  I, Shirron Maranda, CMA, am acting as scribe for [employer], DO.   Delon Lenis, DO

## 2024-04-28 ENCOUNTER — Ambulatory Visit: Admitting: Physical Therapy

## 2024-04-28 ENCOUNTER — Encounter: Payer: Self-pay | Admitting: Physical Therapy

## 2024-04-28 DIAGNOSIS — M25562 Pain in left knee: Secondary | ICD-10-CM

## 2024-04-28 DIAGNOSIS — M25552 Pain in left hip: Secondary | ICD-10-CM

## 2024-04-28 DIAGNOSIS — M6281 Muscle weakness (generalized): Secondary | ICD-10-CM

## 2024-04-28 DIAGNOSIS — R252 Cramp and spasm: Secondary | ICD-10-CM

## 2024-04-28 DIAGNOSIS — M5459 Other low back pain: Secondary | ICD-10-CM

## 2024-04-28 DIAGNOSIS — M542 Cervicalgia: Secondary | ICD-10-CM

## 2024-04-28 NOTE — Therapy (Signed)
 OUTPATIENT PHYSICAL THERAPY LOWER EXTREMITY TREATMENT   Patient Name: Margaret [name] MRN: [id number] DOB:02-16-1951, 73 y.o., female Today's Date: 04/28/2024  END OF SESSION:  PT End of Session - 04/28/24 1314     Visit Number 14    Date for PT Re-Evaluation 04/27/24    Authorization Type BCBS    PT Start Time 1311    PT Stop Time 1355    PT Time Calculation (min) 44 min    Activity Tolerance Patient tolerated treatment well    Behavior During Therapy [facility] for tasks assessed/performed          Past Medical History:  Diagnosis Date   Allergy    Anemia    CAD in native artery 11/27/2023   Colitis    Dyspareunia    Endometriosis    GERD (gastroesophageal reflux disease)    High cholesterol    Hx of abnormal Pap smear 1980's   Hypertension    Osteopenia    Primary hypertension 11/27/2013   Ulcer    Urinary incontinence    Past Surgical History:  Procedure Laterality Date   ABDOMINAL HYSTERECTOMY  09/11/2008   TVH/BSO--Dr. Colvin Dec with TVT   BILATERAL SALPINGOOPHORECTOMY     2009   BLADDER SUSPENSION  09/11/2008   Dr. Colvin Dec   CERVIX LESION DESTRUCTION  11/11/1978   hx abnormal pap--dysplasia   COLONOSCOPY WITH PROPOFOL  Left 10/25/2017   Procedure: COLONOSCOPY WITH PROPOFOL ;  Surgeon: Genell Ken, MD;  Location: [facility];  Service: Gastroenterology;  Laterality: Left;   Patient Active Problem List   Diagnosis Date Noted   Hyperlipidemia LDL goal <70 03/09/2024   CAD in native artery 11/27/2023   Gastroesophageal reflux disease 06/06/2020   Atypical chest pain 04/21/2020   Closed nondisplaced fracture of proximal phalanx of lesser toe of left foot 11/19/2017   GI bleed 10/23/2017   Hypokalemia 10/23/2017   Nausea vomiting and diarrhea 10/23/2017   Hematochezia 10/23/2017   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   Back pain 04/12/2014   Arthritis 04/12/2014   Knee pain 04/12/2014   Primary hypertension 11/27/2013   Osteoarthritis of  left knee 07/16/2012    PCP: Rossie Coon, MD  REFERRING PROVIDER: Jacqulyne Maxim, MD  REFERRING DIAG: knee pain  THERAPY DIAG:  Acute pain of left knee  Other low back pain  Muscle weakness (generalized)  Cervicalgia  Pain in left hip  Cramp and spasm  Rationale for Evaluation and Treatment: Rehabilitation  ONSET DATE: 01/20/24  SUBJECTIVE:   SUBJECTIVE STATEMENT: Doing pretty good  Patient reports that she has had some left knee pain for a few months reports that she has been having left hip pain and the sciatic type pain with some pain in the back, feels like she lifted a heavy box a few months ago.  PERTINENT HISTORY: See above PAIN:  Are you having pain? Yes: NPRS scale: 4/10   Pain location: R calf Pain description: shooting, inconsistent  Aggravating factors: bending stepping over, stairs, sitting pain up to 8/10 Relieving factors: prednisone, movement, the prednisone has really helped now pain 2-3/10  PRECAUTIONS: None  RED FLAGS: None   WEIGHT BEARING RESTRICTIONS: No  FALLS:  Has patient fallen in last 6 months? No  LIVING ENVIRONMENT: Lives with: lives alone Lives in: House/apartment Stairs: No Has following equipment at home: None  OCCUPATION: retired  PLOF: Independent and goes to [employer], aerobics, does yardwork and housework  PATIENT GOALS: have less pain  NEXT MD VISIT: May 2025  OBJECTIVE:  Note: Objective measures were completed at Evaluation unless otherwise noted.  DIAGNOSTIC FINDINGS: arthritic and degenerative changes  PATIENT SURVEYS:  Modified Oswestry 24/50 48%   COGNITION: Overall cognitive status: Within functional limits for tasks assessed     SENSATION: WFL  MUSCLE LENGTH: Very tight HS, calves and piriformis  POSTURE: rounded shoulders, forward head, and increased lumbar lordosis  PALPATION: Very tender and tight in the low back, left buttock and the left GT , mild tenderness in the left patellar  tendon 03/08/24  significant tenderness int he lumbar area and into the buttocks   LUMBAR ROM 03/09/23:  decreased 50% flexion, decreased 75% for extension with pain, and side bending with pain  LOWER EXTREMITY ROM:  Active ROM Left eval Left 03/22/24  Hip flexion 90 86 A  Hip extension 10   Hip abduction 10   Hip adduction    Hip internal rotation    Hip external rotation    Knee flexion 100   Knee extension 0   Ankle dorsiflexion    Ankle plantarflexion    Ankle inversion    Ankle eversion     (Blank rows = not tested)  LOWER EXTREMITY MMT:  MMT Right eval Left eval  Hip flexion 4 4  Hip extension 4 4  Hip abduction 4 4  Hip adduction    Hip internal rotation    Hip external rotation    Knee flexion  4  Knee extension  4  Ankle dorsiflexion    Ankle plantarflexion    Ankle inversion    Ankle eversion     (Blank rows = not tested)  LOWER EXTREMITY SPECIAL TESTS:  Hip special tests: Andy Bannister test: positive  and Piriformis test: positive  Knee special tests: Patellafemoral grind test: positive  SLR positive at 50 degrees Tried Manual pelvic distraction, some relief  FUNCTIONAL TESTS:  5 times sit to stand: 24 seconds 30 seconds chair stand test Timed up and go (TUG): 16 seconds  GAIT: Distance walked: 100 feet Assistive device utilized: None Level of assistance: Complete Independence Comments: mild antalgic gait on the left                                                                                                                                TREATMENT DATE:  04/28/24 Nustep level 5 x 6 minutes 20# leg curls 5# leg extension 20# rows 20# lats Feet on ball K2C, rotation, bridges, isometric abs HS and piriformis stretches Reviewed HEP, then went over the machines above and how to adjust, wrote down the weights, and the sets reps and some little cues for set up and form  04/21/24 Nustep level 5 x 6 minutes 25# HS curls 2x10 Leg extension 10#  2x10 Supine feet on ball K2C, rotation, bridges, isometric abs Passive LE stretches Calve stretches Static lumbar traction45#  04/14/24 NuStep L5 x 6 min Shoulder Ext 5lb 2x10 HS curls 25lb 2x12 Leg Ext  10lb 2x12 Hip Ext 5lb 2x10 Rows & Lats 20lb 2x10 Passive LE stretches  Hamstrings, piriformis, K2C, ITB   04/07/24 NuStep L5 x 6 min HS curls 25lb 2x12 Leg Ext 10lb 2x12 Rows & Lats 20lb 2x10 Shoulder green 2x10 6in lateral step ups  Passive LE stretches  Hamstrings, piriformis, K2C  03/31/24 Nustep level 5 x 6 minutes Seated rows 20# LAts 20# Straight arm pulls 5# AR press 10# Feet on ball K2C, rotation, small bridge, isometric abs Passive LE stretches Lumbar traction 55# static  03/24/24 Bike L 3.6 x6 min Lumbar ROM  Flex limited  25%, Ext WFL, R side bend WFL, L side bend WFL, R rotations WFL, WFL HS curls 25lb 2x12 Leg Ext 10lb 2x12 Seated Rows & Lats 25lb 2x10 S2S OHP yellow ball x10 MHP to lumbar spine   Bridges  SLR x10 each  Passive stretching to HS,piriformis, KTC, and double K2C   03/22/24 NuStep L5 x 6 min Shoulder Ext 5lb 2x10 HS curls 25lb 2x10 Leg Ext 5lb 2x10 Rows 10lb 2x10 MHP to lumbar spine   Bridges  SLR x10 each  LE on pball bridges, oblq, K2C  Passive stretching to HS,piriformis, KTC, and double [facility]   03/15/24 Nustep L5 Shoulder ext 5# 2x10 5# row 2x10 6in step up x8 each Green band pull apart x10 S2S with OH yellow press x10 HS,piriformis stretch, KTC  03/10/24 NuStep L5 x6 min Shoulder Ext 5lb 2x10 HS curls 20lb 2x10 Leg Ext 5lb 2x10  S2S with yellow ball OH press 2x10 Bridge  x10 MHP to Lumbar spine  Feet on ball   KTC x10  Bridge x10 Lumbar HP  HS, hip flex, piriformis stretch    03/08/24 Evaluation of the low back and hips   PATIENT EDUCATION:  Education details: POC/HEP Person educated: Patient Education method: Programmer, multimedia, Demonstration, Verbal cues, and Handouts Education comprehension: verbalized  understanding  HOME EXERCISE PROGRAM: Access Code: [email or web address] URL: [email or web address] Date: 01/26/2024 Prepared by: Cherylene Corrente  Exercises - Supine Piriformis Stretch Pulling Heel to Hip  - 1 x daily - 7 x weekly - 1 sets - 5 reps - 30 hold - Standing Bilateral Gastroc Stretch with Step  - 1 x daily - 7 x weekly - 1 sets - 5 reps - 30 hold - Seated Hamstring Stretch with Chair  - 1 x daily - 7 x weekly - 1 sets - 5 reps - 30 hold  ASSESSMENT:  CLINICAL IMPRESSION: Patient continues to have some hand pain, is in brace and reports may get an injection this week.  Reports that she feels if we do some good explanations and demonstration with her today that she will be good to go, We did this and gave her a handout of the machines, weights, set up, sets and reps, and gave her a description of each to help jog her mind    Patient is a 73 y.o. female who was seen today for physical therapy evaluation and treatment for left knee pain, left hip and low back pain.  Has some degenerative changes in the knee and the low back, feels that this issue started with lifting a heavy box.  She just finished prednisone and is feeling a little better.  She is very tight in the hips, HS and calves.     OBJECTIVE IMPAIRMENTS: Abnormal gait, cardiopulmonary status limiting activity, decreased activity tolerance, decreased balance, decreased endurance, decreased mobility, difficulty walking, decreased ROM, decreased strength, increased edema, increased muscle spasms, impaired  flexibility, improper body mechanics, and pain.   REHAB POTENTIAL: Good  CLINICAL DECISION MAKING: Stable/uncomplicated  EVALUATION COMPLEXITY: Moderate   GOALS: Goals reviewed with patient? Yes  SHORT TERM GOALS: Target date: 03/14/24 Independent with initial HEP Baseline: Goal status: met 02/16/24  LONG TERM GOALS: Target date: 04/27/24  Independent with advanced HEP Baseline:  Goal status: met 04/28/24  2.   Understand posture and body mechanics Baseline:  Goal status: progressing 02/23/24, Met 04/14/24  3.  Decrease pain by 50% Baseline:  Goal status: progressing 03/31/24, Met 70% 04/14/24  4.  Report abel to sit > 30 minutes and get up without pain Baseline:  Goal status: Met 03/22/24  5.  Report be able to sleep through the night without pain Baseline:  Goal status: Progressing 03/22/24 6.  Increase lumbar ROM 25% Met 03/24/24 7.  Decrease back pain 25% Met 03/24/24 30% 8.  Report be able to drive or sit > 1 hour without an increase of pain  met 03/31/24  PLAN:  PT FREQUENCY: 1-2x/week  PT DURATION: 12 weeks  PLANNED INTERVENTIONS: 97164- PT Re-evaluation, 97110-Therapeutic exercises, 97530- Therapeutic activity, 97112- Neuromuscular re-education, 97535- Self Care, 02725- Manual therapy, [id number]- Gait training, [phone]- Electrical stimulation (unattended), 97035- Ultrasound, 03474- Traction (mechanical), [id number]- Ionotophoresis 4mg /ml Dexamethasone, Patient/Family education, Balance training, Stair training, Taping, Dry Needling, Joint mobilization, Cryotherapy, and Moist heat  PLAN FOR NEXT SESSION: Goals met, D/C  Aceyn Kathol W, PT 04/28/2024, 1:15 PM

## 2024-05-04 ENCOUNTER — Encounter (HOSPITAL_BASED_OUTPATIENT_CLINIC_OR_DEPARTMENT_OTHER): Payer: Self-pay | Admitting: Cardiovascular Disease

## 2024-05-14 LAB — COMPREHENSIVE METABOLIC PANEL WITH GFR
ALT: 13 IU/L (ref 0–32)
AST: 20 IU/L (ref 0–40)
Albumin: 4 g/dL (ref 3.9–4.9)
Alkaline Phosphatase: 93 IU/L (ref 44–121)
BUN/Creatinine Ratio: 13 (ref 12–28)
BUN: 10 mg/dL (ref 8–27)
Bilirubin Total: 0.5 mg/dL (ref 0.0–1.2)
CO2: 26 mmol/L (ref 20–29)
Calcium: 9.2 mg/dL (ref 8.7–10.3)
Chloride: 101 mmol/L (ref 96–106)
Creatinine, Ser: 0.77 mg/dL (ref 0.57–1.00)
Globulin, Total: 2.9 g/dL (ref 1.5–4.5)
Glucose: 98 mg/dL (ref 70–99)
Potassium: 4 mmol/L (ref 3.5–5.2)
Sodium: 141 mmol/L (ref 134–144)
Total Protein: 6.9 g/dL (ref 6.0–8.5)
eGFR: 83 mL/min/1.73 (ref >59)

## 2024-05-14 LAB — LIPID PANEL
Chol/HDL Ratio: 2 ratio (ref 0.0–4.4)
Cholesterol, Total: 127 mg/dL (ref 100–199)
HDL: 64 mg/dL (ref >39)
LDL Chol Calc (NIH): 50 mg/dL (ref 0–99)
Triglycerides: 62 mg/dL (ref 0–149)
VLDL Cholesterol Cal: 13 mg/dL (ref 5–40)

## 2024-05-17 ENCOUNTER — Emergency Department (HOSPITAL_BASED_OUTPATIENT_CLINIC_OR_DEPARTMENT_OTHER)

## 2024-05-17 ENCOUNTER — Other Ambulatory Visit: Payer: Self-pay

## 2024-05-17 ENCOUNTER — Emergency Department (HOSPITAL_BASED_OUTPATIENT_CLINIC_OR_DEPARTMENT_OTHER)
Admission: EM | Admit: 2024-05-17 | Discharge: 2024-05-17 | Disposition: A | Discharge status: Home / Self Care | Attending: Emergency Medicine | Admitting: Emergency Medicine

## 2024-05-17 ENCOUNTER — Encounter (HOSPITAL_BASED_OUTPATIENT_CLINIC_OR_DEPARTMENT_OTHER): Payer: Self-pay | Admitting: *Deleted

## 2024-05-17 DIAGNOSIS — Z79899 Other long term (current) drug therapy: Secondary | ICD-10-CM | POA: Insufficient documentation

## 2024-05-17 DIAGNOSIS — G44209 Tension-type headache, unspecified, not intractable: Secondary | ICD-10-CM | POA: Diagnosis not present

## 2024-05-17 DIAGNOSIS — I2510 Atherosclerotic heart disease of native coronary artery without angina pectoris: Secondary | ICD-10-CM | POA: Diagnosis not present

## 2024-05-17 DIAGNOSIS — R0789 Other chest pain: Secondary | ICD-10-CM | POA: Insufficient documentation

## 2024-05-17 DIAGNOSIS — I10 Essential (primary) hypertension: Secondary | ICD-10-CM | POA: Diagnosis not present

## 2024-05-17 DIAGNOSIS — R519 Headache, unspecified: Secondary | ICD-10-CM | POA: Diagnosis present

## 2024-05-17 LAB — BASIC METABOLIC PANEL WITH GFR
Anion gap: 11 (ref 5–15)
BUN: 13 mg/dL (ref 8–23)
CO2: 28 mmol/L (ref 22–32)
Calcium: 9.7 mg/dL (ref 8.9–10.3)
Chloride: 101 mmol/L (ref 98–111)
Creatinine, Ser: 0.72 mg/dL (ref 0.44–1.00)
GFR, Estimated: >60 mL/min (ref >60)
Glucose, Bld: 117 mg/dL — ABNORMAL HIGH (ref 70–99)
Potassium: 3.5 mmol/L (ref 3.5–5.1)
Sodium: 140 mmol/L (ref 135–145)

## 2024-05-17 LAB — CBC WITH DIFFERENTIAL/PLATELET
Abs Immature Granulocytes: 0.01 K/uL (ref 0.00–0.07)
Basophils Absolute: 0 K/uL (ref 0.0–0.1)
Basophils Relative: 0 %
Eosinophils Absolute: 0.1 K/uL (ref 0.0–0.5)
Eosinophils Relative: 3 %
HCT: 37.7 % (ref 36.0–46.0)
Hemoglobin: 11.9 g/dL — ABNORMAL LOW (ref 12.0–15.0)
Immature Granulocytes: 0 %
Lymphocytes Relative: 50 %
Lymphs Abs: 2.2 K/uL (ref 0.7–4.0)
MCH: 26.4 pg (ref 26.0–34.0)
MCHC: 31.6 g/dL (ref 30.0–36.0)
MCV: 83.8 fL (ref 80.0–100.0)
Monocytes Absolute: 0.4 K/uL (ref 0.1–1.0)
Monocytes Relative: 8 %
Neutro Abs: 1.8 K/uL (ref 1.7–7.7)
Neutrophils Relative %: 39 %
Platelets: 226 K/uL (ref 150–400)
RBC: 4.5 MIL/uL (ref 3.87–5.11)
RDW: 15.2 % (ref 11.5–15.5)
WBC: 4.5 K/uL (ref 4.0–10.5)
nRBC: 0 % (ref 0.0–0.2)

## 2024-05-17 LAB — TROPONIN T, HIGH SENSITIVITY: Troponin T High Sensitivity: <15 ng/L (ref <19)

## 2024-05-17 NOTE — ED Triage Notes (Signed)
 Pt presents with c/o headache and chest pain that she describes as heart burn. The headache woke her up this am (0400) , Describes tension/ heaviness. Endorses mild Shob, Rt arm pain, blurred vision. Denies N/V diarrhea.  Started new med: Dermatologist prescribed for hairloss.

## 2024-05-17 NOTE — ED Provider Notes (Signed)
 [place] [facility] AT [facility]  Provider Note  CSN: [id number] Arrival date & time: 05/17/24 0530  History Chief Complaint  Patient presents with   Headache    Margaret [name] is a 73 y.o. female with history of HTN and nonobstructive CAD followed by cardiology. She reports she woke up around 0400hrs with a diffuse headache which is unusual for her. She reports her BP at the time was mildly elevated and after some rest the headache resolved but her BP continued to increase. She reports having some chest pains then but thinks it may have been due to anxiety. She had some reported blurry vision then as well. She reports some recent changes in medications, including hydrochlorothiazide  being her only BP med and a new topical alopecia treatment from dermatology.    Home Medications Prior to Admission medications   Medication Sig Start Date End Date Taking? Authorizing Provider  Cholecalciferol  (VITAMIN D ) 2000 UNITS CAPS Take 6,000 Units by mouth daily.    [provider]  diclofenac  Sodium (VOLTAREN ) 1 % GEL Apply 4 g topically 4 (four) times daily. Patient taking differently: Apply 4 g topically as needed. 08/16/22   Emil Share, DO  ezetimibe  (ZETIA ) 10 MG tablet Take 1 tablet (10 mg total) by mouth daily. 03/09/24 06/07/24  Raford Riggs, MD  Ferrous Sulfate (IRON) 325 (65 Fe) MG TABS 1 tablet Orally Once a day    [provider]  fluticasone (FLONASE SENSIMIST) 27.5 MCG/SPRAY nasal spray 2-4 sprays (1-2 sprays in each nostril) Nasally Once a day for 30 days 12/13/22   [provider]  fluticasone (FLONASE) 50 MCG/ACT nasal spray Place 1 spray into both nostrils daily as needed for allergies or rhinitis.    [provider]  hydrochlorothiazide  (HYDRODIURIL ) 12.5 MG tablet Take 1 tablet (12.5 mg total) by mouth daily. 01/05/24   Raford Riggs, MD  nitroGLYCERIN  (NITROSTAT ) 0.4 MG SL tablet Place 1 tablet (0.4 mg total)  under the tongue every 5 (five) minutes as needed for chest pain. 05/17/22 04/22/24  Patwardhan, Newman PARAS, MD  Olopatadine HCl (PATADAY) 0.2 % SOLN     [provider]  Omega-3 Fatty Acids (FISH OIL) 1000 MG CPDR Take by mouth.    [provider]  rosuvastatin  (CRESTOR ) 5 MG tablet Take 1 tablet by mouth 2 to 3 days per week 03/09/24   Raford Riggs, MD  Safety Seal Miscellaneous MISC Solution Minoxidil 8% Clobetasol USP 0.05% Finasteride USP 0.5% - apply to affected areas of the scalp every morning. Apply with a cotton ball or a Q-tip. 04/22/24   Alm Delon SAILOR, DO     Allergies    Amoxicillin -pot clavulanate, Ciprofloxacin, Levofloxacin, Levonorgestrel-ethinyl estrad, Macrobid [nitrofurantoin], Pantoprazole  sodium, and Sulfa antibiotics   Review of Systems   Review of Systems Please see HPI for pertinent positives and negatives  Physical Exam BP 132/79 (BP Location: Right Arm)   Pulse 75   Temp 98 F (36.7 C) (Oral)   Resp 20   Ht 5' 6 (1.676 m)   Wt 78.5 kg   SpO2 96%   BMI 27.92 kg/m   Physical Exam Vitals and nursing note reviewed.  Constitutional:      Appearance: Normal appearance.  HENT:     Head: Normocephalic and atraumatic.     Nose: Nose normal.     Mouth/Throat:     Mouth: Mucous membranes are moist.  Eyes:     Extraocular Movements: Extraocular movements intact.  Conjunctiva/sclera: Conjunctivae normal.  Cardiovascular:     Rate and Rhythm: Normal rate.  Pulmonary:     Effort: Pulmonary effort is normal.     Breath sounds: Normal breath sounds.  Abdominal:     General: Abdomen is flat.     Palpations: Abdomen is soft.     Tenderness: There is no abdominal tenderness.  Musculoskeletal:        General: No swelling. Normal range of motion.     Cervical back: Neck supple.  Skin:    General: Skin is warm and dry.  Neurological:     General: No focal deficit present.     Mental Status: She is alert and oriented to person, place,  and time.     Cranial Nerves: No cranial nerve deficit.     Sensory: No sensory deficit.     Motor: No weakness.  Psychiatric:        Mood and Affect: Mood normal.     ED Results / Procedures / Treatments   EKG EKG Interpretation Date/Time:  Monday May 17 2024 06:15:01 EDT Ventricular Rate:  71 PR Interval:  196 QRS Duration:  99 QT Interval:  420 QTC Calculation: 457 R Axis:   66  Text Interpretation: Sinus rhythm Consider left atrial enlargement No significant change since last tracing Confirmed by Roselyn Dunnings [phone]) on 05/17/2024 6:15:41 AM  Procedures Procedures  Medications Ordered in the ED Medications - No data to display  Initial Impression and Plan  Patient here with several nonspecific symptoms but she was woken from sleep with headache which is unusual for her. Her vitals and exam are reassuring now. Given her age and co-morbidities will check labs, EKG and CT head. Otherwise she looks well and in no distress.   ED Course   Clinical Course as of 05/17/24 0704  Mon May 17, 2024  0615 I personally viewed the images from radiology studies and agree with radiologist interpretation: CT is unremarkable.  [CS]  0635 CBC is unremarkable.  [CS]  0658 BMP and Trop are normal. Patient is feeling better. Given primary concern for her headache (as opposed to her atypical chest discomfort) and overall low suspicion for ACS, do not feel repeat Trop is warranted. Patient is amenable to discharge. Recommend she take her BP medication when she gets home. PCP follow up, RTED for any other concerns.   [CS]    Clinical Course User Index [CS] Roselyn Dunnings NOVAK, MD     MDM Rules/Calculators/A&P Medical Decision Making Given presenting complaint, I considered that admission might be necessary. After review of results from ED lab and/or imaging studies, admission to the hospital is not indicated at this time.     Problems Addressed: Atypical chest pain: acute illness or  injury Tension headache: acute illness or injury  Amount and/or Complexity of Data Reviewed Labs: ordered. Decision-making details documented in ED Course. Radiology: ordered and independent interpretation performed. Decision-making details documented in ED Course. ECG/medicine tests: ordered and independent interpretation performed. Decision-making details documented in ED Course.  Risk Prescription drug management. Decision regarding hospitalization.     Final Clinical Impression(s) / ED Diagnoses Final diagnoses:  Tension headache  Atypical chest pain    Rx / DC Orders ED Discharge Orders     None        Roselyn Dunnings NOVAK, MD 05/17/24 [phone]

## 2024-05-24 ENCOUNTER — Ambulatory Visit: Payer: Self-pay | Admitting: Cardiovascular Disease

## 2024-06-08 ENCOUNTER — Encounter: Payer: Self-pay | Admitting: Obstetrics and Gynecology

## 2024-06-08 DIAGNOSIS — Z1231 Encounter for screening mammogram for malignant neoplasm of breast: Secondary | ICD-10-CM

## 2024-06-15 ENCOUNTER — Encounter: Payer: Self-pay | Admitting: Podiatry

## 2024-06-15 ENCOUNTER — Ambulatory Visit (INDEPENDENT_AMBULATORY_CARE_PROVIDER_SITE_OTHER): Admitting: Podiatry

## 2024-06-15 VITALS — Ht 66.0 in | Wt 173.0 lb

## 2024-06-15 DIAGNOSIS — B351 Tinea unguium: Secondary | ICD-10-CM

## 2024-06-15 MED ORDER — TERBINAFINE HCL 250 MG PO TABS: 250.0000 mg | ORAL_TABLET | Freq: Every day | ORAL | 0 refills | Status: DC

## 2024-06-15 MED ORDER — CICLOPIROX 8 % EX SOLN: Freq: Every day | CUTANEOUS | 3 refills | Status: AC

## 2024-06-15 NOTE — Progress Notes (Signed)
   Chief Complaint  Patient presents with   Nail Problem    Pt is here due to bilateral great toenail issues, she states that both toenail are old and about to pop off wants to know what can be done.    Subjective: 73 y.o. female presenting today for evaluation of thickening with discoloration of the bilateral toenails.  Onset for several years.  She has not done anything for treatment.  Past Medical History:  Diagnosis Date   Allergy    Anemia    CAD in native artery 11/27/2023   Colitis    Dyspareunia    Endometriosis    GERD (gastroesophageal reflux disease)    High cholesterol    Hx of abnormal Pap smear 1980's   Hypertension    Osteopenia    Primary hypertension 11/27/2013   Ulcer    Urinary incontinence     Past Surgical History:  Procedure Laterality Date   ABDOMINAL HYSTERECTOMY  09/11/2008   TVH/BSO--Dr. Nikki with TVT   BILATERAL SALPINGOOPHORECTOMY     2009   BLADDER SUSPENSION  09/11/2008   Dr. Nikki   CERVIX LESION DESTRUCTION  11/11/1978   hx abnormal pap--dysplasia   COLONOSCOPY WITH PROPOFOL  Left 10/25/2017   Procedure: COLONOSCOPY WITH PROPOFOL ;  Surgeon: Saintclair Jasper, MD;  Location: [facility];  Service: Gastroenterology;  Laterality: Left;    Allergies  Allergen Reactions   Amoxicillin -Pot Clavulanate Nausea And Vomiting and Other (See Comments)   Ciprofloxacin Nausea And Vomiting and Other (See Comments)    Other reaction(s): GI bleeding   Levofloxacin Other (See Comments)    Other reaction(s): itchy,achey bones   Levonorgestrel-Ethinyl Estrad Other (See Comments)   Macrobid [Nitrofurantoin] Itching   Pantoprazole  Sodium Other (See Comments)    Other reaction(s): itching, joint pains   Sulfa Antibiotics Other (See Comments)    Unknown Other reaction(s): do not remember    Objective: Physical Exam General: The patient is alert and oriented x3 in no acute distress.  Dermatology: Hyperkeratotic, discolored, thickened, onychodystrophy  noted. Skin is warm, dry and supple bilateral lower extremities. Negative for open lesions or macerations.  Vascular: Palpable pedal pulses bilaterally. No edema or erythema noted. Capillary refill within normal limits.  Neurological: Grossly intact via light touch  Musculoskeletal Exam: No pedal deformity noted  Assessment: #1 Onychomycosis of toenails bilateral  Plan of Care:  -Patient was evaluated. -Today we discussed different treatment options including oral, topical, and laser antifungal treatment modalities.  We discussed their efficacies and side effects.  Patient opts for oral as well as topical antifungal treatment modality -Prescription for Lamisil  250 mg #90 daily. Pt denies a history of liver pathology or symptoms.  CMP 05/13/2024 hepatic function WNL -Prescription for Penlac  8% topical apply daily -Mechanical debridement of the bilateral toenails performed today.  Smoothed with a rotary bur -Return to clinic 6 months   Thresa EMERSON Sar, DPM [facility]  Dr. Thresa EMERSON Sar, DPM    [address] [place], [place] [address]                Office [phone]  Fax [phone]

## 2024-06-15 NOTE — Patient Instructions (Signed)
Amlactin lotion

## 2024-06-17 ENCOUNTER — Encounter (HOSPITAL_BASED_OUTPATIENT_CLINIC_OR_DEPARTMENT_OTHER): Payer: Self-pay

## 2024-06-17 ENCOUNTER — Emergency Department (HOSPITAL_BASED_OUTPATIENT_CLINIC_OR_DEPARTMENT_OTHER): Admission: EM | Admit: 2024-06-17 | Discharge: 2024-06-17 | Disposition: A | Discharge status: Home / Self Care

## 2024-06-17 ENCOUNTER — Telehealth (HOSPITAL_BASED_OUTPATIENT_CLINIC_OR_DEPARTMENT_OTHER): Payer: Self-pay | Admitting: Cardiovascular Disease

## 2024-06-17 ENCOUNTER — Other Ambulatory Visit: Payer: Self-pay

## 2024-06-17 ENCOUNTER — Telehealth: Payer: Self-pay | Admitting: *Deleted

## 2024-06-17 ENCOUNTER — Emergency Department (HOSPITAL_BASED_OUTPATIENT_CLINIC_OR_DEPARTMENT_OTHER)

## 2024-06-17 DIAGNOSIS — R0602 Shortness of breath: Secondary | ICD-10-CM | POA: Insufficient documentation

## 2024-06-17 DIAGNOSIS — R072 Precordial pain: Secondary | ICD-10-CM

## 2024-06-17 DIAGNOSIS — R079 Chest pain, unspecified: Secondary | ICD-10-CM

## 2024-06-17 DIAGNOSIS — I10 Essential (primary) hypertension: Secondary | ICD-10-CM | POA: Diagnosis not present

## 2024-06-17 DIAGNOSIS — R0789 Other chest pain: Secondary | ICD-10-CM | POA: Diagnosis present

## 2024-06-17 DIAGNOSIS — Z79899 Other long term (current) drug therapy: Secondary | ICD-10-CM | POA: Diagnosis not present

## 2024-06-17 LAB — CBC
HCT: 36.9 % (ref 36.0–46.0)
Hemoglobin: 11.7 g/dL — ABNORMAL LOW (ref 12.0–15.0)
MCH: 26.5 pg (ref 26.0–34.0)
MCHC: 31.7 g/dL (ref 30.0–36.0)
MCV: 83.5 fL (ref 80.0–100.0)
Platelets: 228 K/uL (ref 150–400)
RBC: 4.42 MIL/uL (ref 3.87–5.11)
RDW: 15.1 % (ref 11.5–15.5)
WBC: 5 K/uL (ref 4.0–10.5)
nRBC: 0 % (ref 0.0–0.2)

## 2024-06-17 LAB — COMPREHENSIVE METABOLIC PANEL WITH GFR
ALT: 13 U/L (ref 0–44)
AST: 23 U/L (ref 15–41)
Albumin: 4.2 g/dL (ref 3.5–5.0)
Alkaline Phosphatase: 88 U/L (ref 38–126)
Anion gap: 10 (ref 5–15)
BUN: 12 mg/dL (ref 8–23)
CO2: 29 mmol/L (ref 22–32)
Calcium: 9.5 mg/dL (ref 8.9–10.3)
Chloride: 103 mmol/L (ref 98–111)
Creatinine, Ser: 0.7 mg/dL (ref 0.44–1.00)
GFR, Estimated: >60 mL/min (ref >60)
Glucose, Bld: 106 mg/dL — ABNORMAL HIGH (ref 70–99)
Potassium: 3.5 mmol/L (ref 3.5–5.1)
Sodium: 141 mmol/L (ref 135–145)
Total Bilirubin: 0.2 mg/dL (ref 0.0–1.2)
Total Protein: 7.3 g/dL (ref 6.5–8.1)

## 2024-06-17 LAB — D-DIMER, QUANTITATIVE: D-Dimer, Quant: <0.27 ug{FEU}/mL (ref 0.00–0.50)

## 2024-06-17 LAB — TROPONIN T, HIGH SENSITIVITY
Troponin T High Sensitivity: <15 ng/L (ref <19)
Troponin T High Sensitivity: <15 ng/L (ref <19)

## 2024-06-17 LAB — LIPASE, BLOOD: Lipase: 30 U/L (ref 11–51)

## 2024-06-17 MED ORDER — ALUM & MAG HYDROXIDE-SIMETH 200-200-20 MG/5ML PO SUSP
30.0000 mL | Freq: Once | ORAL | Status: AC
  Administered 2024-06-17: 30 mL via ORAL
  Filled 2024-06-17: qty 30

## 2024-06-17 MED ORDER — NITROGLYCERIN 0.4 MG SL SUBL
0.4000 mg | SUBLINGUAL_TABLET | SUBLINGUAL | 3 refills | Status: AC | PRN
Start: 2024-06-17 — End: ?

## 2024-06-17 NOTE — ED Triage Notes (Signed)
 Report CP started last night w/SOB, denies pain at this time, A+Ox4, VSS, NAD noted.
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
## 2024-06-17 NOTE — Telephone Encounter (Signed)
 Reports Having some labored breathing with activity and chest pain and sob last night.   her BP's- 8/1-126/77 hr 112 8/2-133/78 hr 104 8/4- 124/71 hr 93 8/5- 138/74  8/6- 141/87 hr 80   Chest pain intense last night for a few minutes with some shortness of breath- took a NTG and it went away  She reports that she has been doing a lot of driving and traveling lately. Has had some soreness in lower back and hands, some fatigue, just not feeling well.  The soonest appt is 06/22/24 with Josefa Beauvais, NP at [address]. Informed her of location.   Instructed her to be seen TODAY by her PCP or at an Urgent Care, so an EKG can be performed and she can be assessed- listen to heart and lungs- they can give further recommendations.   Informed her that I would also send this to her provider for recommendations and if there are any further recommendations, then we will call her.    If you experience active Chest Pain, including tightness, pressure, jaw pain, radiating pain to shoulder/upper arm/back, Chest Pain unrelieved by 2-3 doses of Nitroglycerin , Shortness Of Breath, nausea, vomiting, and/or sweating- THEN CALL 911 AND GO TO THE NEAREST EMERGENCY ROOM  She verbalized understanding of all information and will be seen by a provider- either PCP or Urgent Care today.

## 2024-06-17 NOTE — Telephone Encounter (Signed)
  Pt c/o of Chest Pain: STAT if active CP, including tightness, pressure, jaw pain, radiating pain to shoulder/upper arm/back, CP unrelieved by Nitro. Symptoms reported of SOB, nausea, vomiting, sweating.  1. Are you having CP right now?   No   2. Are you experiencing any other symptoms (ex. SOB, nausea, vomiting, sweating)?   A little bit of SOB  3. Is your CP continuous or coming and going?   Coming and going  4. Have you taken Nitroglycerin ?   Yes  5. How long have you been experiencing CP?  Started last night   6. If NO CP at time of call then end call with telling Pt to call back or call 911 if Chest pain returns prior to return call from triage team.   Patient stated she is concerned she had chest pains last night and took a Nitroglycerin  tablet.  Patient stated she has been having more episode lately and is having labored breathing.  Patient noted she has been doing more activities lately such as yard work.

## 2024-06-17 NOTE — Discharge Instructions (Addendum)
 As discussed please follow-up with your primary doctor, cardiologist and gastroenterologist.  Return if develop fevers, chills, lightheadedness, passout, worsening chest pain, shortness of breath or any new or worsening symptoms that are concerning to you

## 2024-06-17 NOTE — Telephone Encounter (Signed)
 Patient left message on triage line 06/16/24 at 1604 requesting return call for left breast Dx MMG.   Spoke with patient. Patient reports intermittent left breast pain. Denies breast lumps, fever/chills, skin changes, nipple discharge. Patient called to schedule screening MMG due 07/2024, was advised to contact MD for Dx MMG order. Advised will need OV for further evaluation, patient agreeable, request to schedule with Dr. Nikki. Work in appt scheduled for 06/21/24 at 1045.   Last AEX 08/25/23 MMG 07/17/23; BiRads 1 neg  Routing to provider for final review. Patient is agreeable to disposition. Will close encounter.

## 2024-06-17 NOTE — Telephone Encounter (Signed)
 Called the patient. She is unable to be seen at PCP today, she plans to go to an Urgent Care- she reports that she is feeling fine and is just resting/taking it easy.  Sent in refill of NTG to her pharmacy.

## 2024-06-17 NOTE — Telephone Encounter (Signed)
 Agree with plan for evaluation at urgent care or PCP. Margaret [name], Margaret [name] does have [facility] slots tomorrow so could reach out to scheduling team to see if it is appropriate to add her onto that schedule, will need permission of scheduling team. Please ensure her PRN nitroglycerin  Rx is up to date, appears last sent in 2023 and may be expired. BPs look reassuring, reassuring that chest pain went away with 1 nitroglycerin . Agree with recommendation for ED if chest pain does not improved after 3 doses of nitroglycerin .   Callan Yontz S Morgin Halls, NP

## 2024-06-17 NOTE — ED Provider Notes (Signed)
 [place] [facility] Provider Note   CSN: [id number] Arrival date & time: 06/17/24  1859     Patient presents with: Chest Pain   Margaret [name] is a 73 y.o. female.   This is a 73 year old female presenting emergency department for evaluation of her chest pain and shortness of breath.  Had chest pain last night while she was watching TV.  Associated chest tightness, but nonradiating.  Described as dull.  Improved with nitro.  Has not been feeling well for roughly the past week.  Notes that over the past 5 weeks she is traveled extensively otherwise no risk factors for PE.  Was sent by urgent care for evaluation today.  She denies no significant cardiac history, but does see cardiology for her hypertension.   Chest Pain      Prior to Admission medications   Medication Sig Start Date End Date Taking? Authorizing Provider  Cholecalciferol  (VITAMIN D ) 2000 UNITS CAPS Take 6,000 Units by mouth daily.    [provider]  ciclopirox  (PENLAC ) 8 % solution Apply topically at bedtime. Apply over nail and surrounding skin. Apply daily over previous coat. After seven (7) days, may remove with alcohol and continue cycle. 06/15/24   Janit Thresa HERO, DPM  diclofenac  Sodium (VOLTAREN ) 1 % GEL Apply 4 g topically 4 (four) times daily. Patient taking differently: Apply 4 g topically as needed. 08/16/22   Emil Share, DO  ezetimibe  (ZETIA ) 10 MG tablet Take 1 tablet (10 mg total) by mouth daily. 03/09/24 06/15/24  Raford Riggs, MD  Ferrous Sulfate (IRON) 325 (65 Fe) MG TABS 1 tablet Orally Once a day    [provider]  fluticasone (FLONASE SENSIMIST) 27.5 MCG/SPRAY nasal spray 2-4 sprays (1-2 sprays in each nostril) Nasally Once a day for 30 days 12/13/22   [provider]  fluticasone (FLONASE) 50 MCG/ACT nasal spray Place 1 spray into both nostrils daily as needed for allergies or rhinitis.    [provider]   hydrochlorothiazide  (HYDRODIURIL ) 12.5 MG tablet Take 1 tablet (12.5 mg total) by mouth daily. 01/05/24   Raford Riggs, MD  nitroGLYCERIN  (NITROSTAT ) 0.4 MG SL tablet Place 1 tablet (0.4 mg total) under the tongue every 5 (five) minutes as needed for chest pain. 06/17/24   Walker, Caitlin S, NP  Olopatadine HCl (PATADAY) 0.2 % SOLN     [provider]  Omega-3 Fatty Acids (FISH OIL) 1000 MG CPDR Take by mouth.    [provider]  rosuvastatin  (CRESTOR ) 5 MG tablet Take 1 tablet by mouth 2 to 3 days per week 03/09/24   Raford Riggs, MD  Safety Seal Miscellaneous MISC Solution Minoxidil 8% Clobetasol USP 0.05% Finasteride USP 0.5% - apply to affected areas of the scalp every morning. Apply with a cotton ball or a Q-tip. 04/22/24   Alm Delon SAILOR, DO  terbinafine  (LAMISIL ) 250 MG tablet Take 1 tablet (250 mg total) by mouth daily. 06/15/24   Janit Thresa HERO, DPM    Allergies: Amoxicillin -pot clavulanate, Ciprofloxacin, Levofloxacin, Levonorgestrel-ethinyl estrad, Macrobid [nitrofurantoin], Pantoprazole  sodium, and Sulfa antibiotics    Review of Systems  Cardiovascular:  Positive for chest pain.    Updated Vital Signs BP 131/76   Pulse 71   Temp 98.6 F (37 C) (Oral)   Resp 13   Ht 5' 6 (1.676 m)   Wt 79.4 kg   SpO2 99%   BMI 28.25 kg/m   Physical Exam Vitals and nursing note reviewed.  Constitutional:      General: She is not in acute distress.    Appearance: She is not toxic-appearing.  HENT:     Head: Normocephalic.  Cardiovascular:     Rate and Rhythm: Normal rate and regular rhythm.     Pulses:          Radial pulses are 2+ on the right side and 2+ on the left side.     Heart sounds: Normal heart sounds.  Pulmonary:     Breath sounds: Normal breath sounds.  Abdominal:     Palpations: Abdomen is soft.  Musculoskeletal:     Right lower leg: No edema.     Left lower leg: No edema.  Skin:    General: Skin is warm and dry.     Capillary Refill:  Capillary refill takes less than 2 seconds.  Neurological:     Mental Status: She is alert and oriented to person, place, and time.  Psychiatric:        Mood and Affect: Mood normal.        Behavior: Behavior normal.     (all labs ordered are listed, but only abnormal results are displayed) Labs Reviewed  CBC - Abnormal; Notable for the following components:      Result Value   Hemoglobin 11.7 (*)    All other components within normal limits  COMPREHENSIVE METABOLIC PANEL WITH GFR - Abnormal; Notable for the following components:   Glucose, Bld 106 (*)    All other components within normal limits  LIPASE, BLOOD  D-DIMER, QUANTITATIVE  TROPONIN T, HIGH SENSITIVITY  TROPONIN T, HIGH SENSITIVITY    EKG: EKG Interpretation Date/Time:  Thursday June 17 2024 19:12:02 EDT Ventricular Rate:  71 PR Interval:  174 QRS Duration:  99 QT Interval:  404 QTC Calculation: 439 R Axis:   56  Text Interpretation: Sinus rhythm Confirmed by Neysa Clap [phone]) on 06/17/2024 7:19:14 PM  Radiology: DG Chest 2 View Result Date: 06/17/2024 CLINICAL DATA:  Chest pain and labored breathing EXAM: CHEST - 2 VIEW COMPARISON:  08/13/2023 FINDINGS: The heart size and mediastinal contours are within normal limits. Both lungs are clear. The visualized skeletal structures are unremarkable. IMPRESSION: No active cardiopulmonary disease. Electronically Signed   By: Norman Gatlin M.D.   On: 06/17/2024 19:50     Procedures   Medications Ordered in the ED  alum & mag hydroxide-simeth (MAALOX/MYLANTA) 200-200-20 MG/5ML suspension 30 mL (30 mLs Oral Given 06/17/24 2029)    Clinical Course as of 06/17/24 2144  Thu Jun 17, 2024  1915 Ct coronary in 2023 per my chart review: IMPRESSION: 1. Coronary calcium  score of 0.   2. Normal coronary origin with right dominance.   3. 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 Mild non-obstructive CAD  (25-49%). Consider non-atherosclerotic causes of chest pain. Consider preventive therapy and risk factor modification.   Electronically Signed: By: Lonni Nanas M.D. On: 10/18/2022 [TY]  2026 D-Dimer, Quant: <0.27 PE less likely; low risk by Wells criteria [TY]  2026 Troponin T High Sensitivity: <15 ACS less likely [TY]  2117 CBC(!) No anemia to explain her symptoms.  No fever tachycardia or leukocytosis either [TY]  2117 Comprehensive metabolic panel(!) No significant metabolic derangements.  Normal kidney function. [TY]  2117 Lipase: 30 Pancreatitis unlikely [TY]  2143 Feeling improved after GI cocktail.  Her second troponin is negative.  Will discharge in stable condition.  She reports that  she has upcoming appointments with her cardiologist and her gastroenterologist. [TY]    Clinical Course User Index [TY] Neysa Caron PARAS, DO                                 Medical Decision Making This is a 73 year old female presenting emergency department for chest pain and shortness of breath.  She is afebrile nontachycardic, is slightly hypertensive, but maintaining oxygen saturation on room air.  Does not appear to be in significant distress.  Her chest pain is essentially resolved, but notes that she does have some chest tightness, denies shortness of breath currently.  Her initial EKG on my independent interpretation without STEMI, and does appear to be similar to prior.  Will get cardiac workup.  She does have extensive travel history in the past 5 weeks, possible PE?  Not tachycardic or hypoxic currently and otherwise low risk.  Will get D-dimer to evaluate.  See ED course for further MDM and final disposition.   Amount and/or Complexity of Data Reviewed External Data Reviewed:     Details: See ED course Labs: ordered. Decision-making details documented in ED Course. Radiology: ordered and independent interpretation performed. Decision-making details documented in ED  Course. ECG/medicine tests: independent interpretation performed.    Details: See above  Risk OTC drugs. Decision regarding hospitalization. Diagnosis or treatment significantly limited by social determinants of health. Risk Details: Poor health literacy      Final diagnoses:  Chest pain, unspecified type    ED Discharge Orders     None          Neysa Caron PARAS, DO 06/17/24 2144

## 2024-06-18 NOTE — Progress Notes (Signed)
 Cardiology Clinic Note   Patient Name: Margaret [name] Date of Encounter: 06/22/2024  Primary Care Provider:  [name], MD Primary Cardiologist:  Annabella Scarce, MD  Patient Profile    Margaret [name] 73 year old female presents to the clinic today for follow-up evaluation of her chest pain and shortness of breath.  Past Medical History    Past Medical History:  Diagnosis Date   Allergy    Anemia    CAD in native artery 11/27/2023   Colitis    Dyspareunia    Endometriosis    GERD (gastroesophageal reflux disease)    High cholesterol    Hx of abnormal Pap smear 1980's   Hypertension    Osteopenia    Primary hypertension 11/27/2013   Ulcer    Urinary incontinence    Past Surgical History:  Procedure Laterality Date   ABDOMINAL HYSTERECTOMY  09/11/2008   TVH/BSO--Dr. Nikki with TVT   BILATERAL SALPINGOOPHORECTOMY     2009   BLADDER SUSPENSION  09/11/2008   Dr. Nikki   CERVIX LESION DESTRUCTION  11/11/1978   hx abnormal pap--dysplasia   COLONOSCOPY WITH PROPOFOL  Left 10/25/2017   Procedure: COLONOSCOPY WITH PROPOFOL ;  Surgeon: Saintclair Jasper, MD;  Location: [facility];  Service: Gastroenterology;  Laterality: Left;    Allergies  Allergies  Allergen Reactions   Amoxicillin -Pot Clavulanate Nausea And Vomiting and Other (See Comments)   Ciprofloxacin Nausea And Vomiting and Other (See Comments)    Other reaction(s): GI bleeding   Levofloxacin Other (See Comments)    Other reaction(s): itchy,achey bones   Levonorgestrel-Ethinyl Estrad Other (See Comments)   Macrobid [Nitrofurantoin] Itching   Pantoprazole  Sodium Other (See Comments)    Other reaction(s): itching, joint pains   Sulfa Antibiotics Other (See Comments)    Unknown Other reaction(s): do not remember    History of Present Illness    Margaret [name] has a PMH of HTN, CAD, GI bleed, GERD, back pain, knee pain, hypokalemia, hyperlipidemia, hematochezia, and arthritis.   Coronary CT 2023 showed a coronary calcium  score of 0.  She was noted to have mild nonobstructive plaque 25-49% along her proximal LAD.   She was seen and evaluated in the emergency department on 06/17/2024.  She presented with chest pain and shortness of breath.  She noted chest pain while she had been watching TV.  She reported chest tightness that was nonradiating.  She described the pain as dull.  It improved with nitroglycerin .  She was referred by urgent care for further evaluation.  She denied significant cardiac history but did report hypertension.  Her EKG showed sinus rhythm 71 bpm.  Her D-dimer was reassuring.  Her CMP showed stable electrolytes and renal function.  Her CBC showed mildly reduced hemoglobin at 11.7.  Her high-sensitivity troponins were less than 15 x 2.  She received GI cocktail and her symptoms improved.  She reported that she had an upcoming appointment with cardiology and GI.  She presents to the clinic today for follow-up evaluation and states she was concerned about her chest discomfort that developed at night.  It was nitro responsive.  We reviewed her emergency department visit.  She expressed understanding.  We also reviewed her previous coronary CTA and most recent lipid panel.  Her LDL was noted to be 50.  She has been walking her dog daily and denies chest pain with this.  She does note some increased shortness of breath which she attributes to her fitness level.  She is somewhat  limited in her physical activity due to her left knee and back pain.  I reassured her that her symptoms did not appear to be cardiac.  I will have her continue her antacid medication and give her instructions for GERD diet.  Will plan follow-up in 6 months.  Today she denies  lower extremity edema, fatigue, palpitations, melena, hematuria, hemoptysis, diaphoresis, weakness, presyncope, syncope, orthopnea, and PND.   Home Medications    Prior to Admission medications   Medication Sig Start  Date End Date Taking? Authorizing Provider  Cholecalciferol  (VITAMIN D ) 2000 UNITS CAPS Take 6,000 Units by mouth daily.    [provider]  ciclopirox  (PENLAC ) 8 % solution Apply topically at bedtime. Apply over nail and surrounding skin. Apply daily over previous coat. After seven (7) days, may remove with alcohol and continue cycle. 06/15/24   Janit Thresa [name], DPM  diclofenac  Sodium (VOLTAREN ) 1 % GEL Apply 4 g topically 4 (four) times daily. Patient taking differently: Apply 4 g topically as needed. 08/16/22   Floyd, Dan, DO  ezetimibe  (ZETIA ) 10 MG tablet Take 1 tablet (10 mg total) by mouth daily. 03/09/24 06/15/24  Raford Riggs, MD  Ferrous Sulfate (IRON) 325 (65 Fe) MG TABS 1 tablet Orally Once a day    [provider]  fluticasone (FLONASE SENSIMIST) 27.5 MCG/SPRAY nasal spray 2-4 sprays (1-2 sprays in each nostril) Nasally Once a day for 30 days 12/13/22   [provider]  fluticasone (FLONASE) 50 MCG/ACT nasal spray Place 1 spray into both nostrils daily as needed for allergies or rhinitis.    [provider]  hydrochlorothiazide  (HYDRODIURIL ) 12.5 MG tablet Take 1 tablet (12.5 mg total) by mouth daily. 01/05/24   Raford Riggs, MD  nitroGLYCERIN  (NITROSTAT ) 0.4 MG SL tablet Place 1 tablet (0.4 mg total) under the tongue every 5 (five) minutes as needed for chest pain. 06/17/24   Walker, Caitlin S, NP  Olopatadine HCl (PATADAY) 0.2 % SOLN     [provider]  Omega-3 Fatty Acids (FISH OIL) 1000 MG CPDR Take by mouth.    [provider]  rosuvastatin  (CRESTOR ) 5 MG tablet Take 1 tablet by mouth 2 to 3 days per week 03/09/24   Raford Riggs, MD  Safety Seal Miscellaneous MISC Solution Minoxidil 8% Clobetasol USP 0.05% Finasteride USP 0.5% - apply to affected areas of the scalp every morning. Apply with a cotton ball or a Q-tip. 04/22/24   Alm Delon SAILOR, DO  terbinafine  (LAMISIL ) 250 MG tablet Take 1 tablet (250 mg total) by mouth daily.  06/15/24   Janit Thresa [name], DPM    Family History    Family History  Problem Relation Age of Onset   Diabetes Mother    Hypertension Mother    Thyroid  disease Sister    Lung cancer Brother    Seizures Brother    Diabetes Brother    Hypertension Brother    Diabetes Brother    Breast cancer Cousin    Arthritis Other    Diabetes Other    She indicated that her mother is deceased. She indicated that her father is deceased. She indicated that all of her four sisters are alive. She indicated that three of her six brothers are alive. She indicated that her maternal grandfather is deceased. She indicated that her paternal grandmother is deceased. She indicated that her paternal grandfather is deceased. She indicated that both of her daughters are alive. She indicated that her son is alive. She indicated that her  cousin is deceased. She indicated that the status of her other is unknown.  Social History    Social History   Socioeconomic History   Marital status: Single    Spouse name: Not on file   Number of children: Not on file   Years of education: college   Highest education level: Not on file  Occupational History   Not on file  Tobacco Use   Smoking status: Never   Smokeless tobacco: Never  Vaping Use   Vaping status: Never Used  Substance and Sexual Activity   Alcohol use: Yes    Comment: rare, social   Drug use: No   Sexual activity: Not Currently    Birth control/protection: Surgical    Comment: TAH/BSO, more than 5, after 18, no STD, no abnormal pap, no DES  Other Topics Concern   Not on file  Social History Narrative   Not on file   Social Drivers of Health   Financial Resource Strain: Not on file  Food Insecurity: No Food Insecurity (12/19/2023)   Hunger Vital Sign    Worried About Running Out of Food in the Last Year: Never true    Ran Out of Food in the Last Year: Never true  Transportation Needs: No Transportation Needs (12/19/2023)   PRAPARE - Therapist, art (Medical): No    Lack of Transportation (Non-Medical): No  Physical Activity: Inactive (11/27/2023)   Exercise Vital Sign    Days of Exercise per Week: 0 days    Minutes of Exercise per Session: 0 min  Stress: Not on file  Social Connections: Not on file  Intimate Partner Violence: Not At Risk (12/19/2023)   Humiliation, Afraid, Rape, and Kick questionnaire    Fear of Current or Ex-Partner: No    Emotionally Abused: No    Physically Abused: No    Sexually Abused: No     Review of Systems    General:  No chills, fever, night sweats or weight changes.  Cardiovascular:  No chest pain, dyspnea on exertion, edema, orthopnea, palpitations, paroxysmal nocturnal dyspnea. Dermatological: No rash, lesions/masses Respiratory: No cough, dyspnea Urologic: No hematuria, dysuria Abdominal:   No nausea, vomiting, diarrhea, bright red blood per rectum, melena, or hematemesis Neurologic:  No visual changes, wkns, changes in mental status. All other systems reviewed and are otherwise negative except as noted above.  Physical Exam    VS:  BP 118/68   Pulse 83   Ht 5' 6 (1.676 m)   Wt 177 lb 9.6 oz (80.6 kg)   SpO2 97%   BMI 28.67 kg/m  , BMI Body mass index is 28.67 kg/m. GEN: Well nourished, well developed, in no acute distress. HEENT: normal. Neck: Supple, no JVD, carotid bruits, or masses. Cardiac: RRR, no murmurs, rubs, or gallops. No clubbing, cyanosis, edema.  Radials/DP/PT 2+ and equal bilaterally.  Respiratory:  Respirations regular and unlabored, clear to auscultation bilaterally. GI: Soft, nontender, nondistended, BS + x 4. MS: no deformity or atrophy. Skin: warm and dry, no rash. Neuro:  Strength and sensation are intact. Psych: Normal affect.  Accessory Clinical Findings    Recent Labs: 10/15/2023: TSH 1.69 06/17/2024: ALT 13; BUN 12; Creatinine, Ser 0.70; Hemoglobin 11.7; Platelets 228; Potassium 3.5; Sodium 141   Recent Lipid Panel    Component  Value Date/Time   CHOL 127 05/13/2024 1138   TRIG 62 05/13/2024 1138   HDL 64 05/13/2024 1138   CHOLHDL 2.0 05/13/2024 1138   LDLCALC  50 05/13/2024 1138         ECG personally reviewed by me today-none today.     Coronary CTA 10/18/2022  FINDINGS: A 100 kV prospective scan was triggered in the descending thoracic aorta at 111 HU's. Axial non-contrast 3 mm slices were carried out through the heart. The data set was analyzed on a dedicated work station and scored using the Agatson method. Gantry rotation speed was 250 msecs and collimation was .6 mm. No beta blockade and 0.8 mg of sl NTG was given. The 3D data set was reconstructed in 5% intervals of the 35-75% of the R-R cycle. Phases were analyzed on a dedicated work station using MPR, MIP and VRT modes. The patient received 100 cc of contrast.   Coronary Arteries:  Normal coronary origin.  Right dominance.   RCA is a large dominant artery that gives rise to PDA and PLA. There is no plaque.   Left main is a large artery that gives rise to LAD and LCX arteries.   LAD is a large vessel. Noncalcified plaque in the proximal LAD causes 25-49% stenosis   LCX is a non-dominant artery that gives rise to one large OM1 branch. There is no plaque.   Other findings:   Left Ventricle: Normal size   Left Atrium: Normal size   Pulmonary Veins: Normal configuration   Right Ventricle: Normal size   Right Atrium: Normal size   Cardiac valves: No calcifications   Thoracic aorta: Normal size   Pulmonary Arteries: Normal size   Systemic Veins: Normal drainage   Pericardium: Normal thickness   IMPRESSION: 1. Coronary calcium  score of 0.   2. Normal coronary origin with right dominance.   3. 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 Mild non-obstructive CAD (25-49%). Consider non-atherosclerotic causes of chest pain. Consider preventive therapy and risk  factor modification.   Electronically Signed: By: Lonni Nanas M.D. On: 10/18/2022 12:01       Assessment & Plan   1.  Chest discomfort-no chest pain today.  Reviewed recent emergency department visit.  Patient reassured.  Cardiac troponins less than 15 x 2.  EKG reassuring.  Previous coronary CT showed mild nonobstructive plaque 25-49% along proximal LAD. Continue taking prilosec GERD diet instructions Elevate head of bed Patient reassured  GERD-reviewed recommendations from emergency department and ED treatment. GERD diet instructions Continue Prilosec Elevate head of bed Avoid tightfitting clothes  Hyperlipidemia-LDL 50 on 05/13/24. High-fiber diet Increase physical activity as tolerated Continue omega-3 fatty acids, rosuvastatin , ezetimibe   Coronary artery disease-no chest pain today.  Denies exertional chest pain.  Reassuring coronary CTA Continue rosuvastatin , ezetimibe , omega-3 fatty acids Heart healthy low-sodium diet   Disposition: Follow-up with Dr. Raford or me in 6 months.   Margaret [name]. Airika Alkhatib NP-C     06/22/2024, 11:41 AM [facility] [address] Office [phone] Fax [phone]    I spent 14 minutes examining this patient, reviewing medications, and using patient centered shared decision making involving their cardiac care.   I spent  20 minutes reviewing past medical history,  medications, and prior cardiac tests.

## 2024-06-21 ENCOUNTER — Telehealth: Payer: Self-pay | Admitting: Obstetrics and Gynecology

## 2024-06-21 ENCOUNTER — Encounter: Payer: Self-pay | Admitting: Obstetrics and Gynecology

## 2024-06-21 ENCOUNTER — Other Ambulatory Visit: Payer: Self-pay | Admitting: Obstetrics and Gynecology

## 2024-06-21 ENCOUNTER — Other Ambulatory Visit: Payer: Self-pay

## 2024-06-21 ENCOUNTER — Ambulatory Visit (INDEPENDENT_AMBULATORY_CARE_PROVIDER_SITE_OTHER): Admitting: Obstetrics and Gynecology

## 2024-06-21 VITALS — Wt 174.0 lb

## 2024-06-21 DIAGNOSIS — N644 Mastodynia: Secondary | ICD-10-CM

## 2024-06-21 NOTE — Telephone Encounter (Signed)
 [facility] at 2:40pm

## 2024-06-21 NOTE — Patient Instructions (Signed)
 Breast Tenderness Breast tenderness is a common problem for women of all ages, but may also occur in men. Breast tenderness has many possible causes, including hormone changes, infections, taking certain medicines, and caffeine intake. In women, the pain usually comes and goes with the menstrual cycle, but it can also be constant. Breast tenderness may range from mild discomfort to severe pain. You may have tests, such as a mammogram or an ultrasound, to check for any unusual findings. Having breast tenderness usually does not mean that you have breast cancer. Follow these instructions at home: Managing pain and discomfort  If directed, put ice on the painful area. To do this: Put ice in a plastic bag. Place a towel between your skin and the bag. Leave the ice on for 20 minutes, 2-3 times a day. If your skin turns bright red, remove the ice right away to prevent skin damage. The risk of skin damage is higher if you cannot feel pain, heat, or cold. Wear a supportive bra or chest support: During exercise. While sleeping, if your breasts are very tender. Medicines Take over-the-counter and prescription medicines only as told by your health care provider. If the cause of your pain is an infection, you may be prescribed an antibiotic medicine. If you were prescribed antibiotics, take them as told by your health care provider. Do not stop using the antibiotic even if you start to feel better. Eating and drinking Decrease the amount of caffeine in your diet. Instead, drink more water and choose caffeine-free drinks. Your health care provider may recommend that you lessen the amount of fat in your diet. You can do this by: Limiting fried foods. Cooking foods using methods such as baking, boiling, grilling, and broiling. General instructions  Keep a log of the days and times when your breasts are most tender. Ask your health care provider how to do breast exams at home. This will help you notice if  you have an unusual growth or lump. Keep all follow-up visits. Contact a health care provider if: Any part of your breast is hard, red, and hot to the touch. This may be a sign of infection. You are a woman and have a new or painful lump in your breast that remains after your menstrual period ends. You are not breastfeeding and you have fluid, especially blood or pus, coming out of your nipples. You have a fever. Your pain does not improve or it gets worse. Your pain is interfering with your daily activities. Summary Breast tenderness may range from mild discomfort to severe pain. Breast tenderness has many possible causes, including hormone changes, infections, taking certain medicines, and caffeine intake. It can be treated with ice, wearing a supportive bra or chest support, and medicines. Make changes to your diet as told by your health care provider. This information is not intended to replace advice given to you by your health care provider. Make sure you discuss any questions you have with your health care provider. Document Revised: 01/09/2022 Document Reviewed: 01/09/2022 Elsevier Patient Education  2024 [employer].

## 2024-06-21 NOTE — Telephone Encounter (Signed)
 Please schedule dx bilateral mammogram and left breast US  at [facility].   Patient has left breast pain at 11:00 ad lateral breast pain as well.

## 2024-06-22 ENCOUNTER — Ambulatory Visit: Attending: General Practice | Admitting: General Practice

## 2024-06-22 ENCOUNTER — Encounter: Payer: Self-pay | Admitting: General Practice

## 2024-06-22 VITALS — BP 118/68 | HR 83 | Ht 66.0 in | Wt 177.6 lb

## 2024-06-22 DIAGNOSIS — R0789 Other chest pain: Secondary | ICD-10-CM | POA: Diagnosis not present

## 2024-06-22 DIAGNOSIS — I2510 Atherosclerotic heart disease of native coronary artery without angina pectoris: Secondary | ICD-10-CM | POA: Diagnosis not present

## 2024-06-22 DIAGNOSIS — E785 Hyperlipidemia, unspecified: Secondary | ICD-10-CM

## 2024-06-22 DIAGNOSIS — K219 Gastro-esophageal reflux disease without esophagitis: Secondary | ICD-10-CM

## 2024-06-22 NOTE — Patient Instructions (Addendum)
 Medication Instructions:  Your physician recommends that you continue on your current medications as directed. Please refer to the Current Medication list given to you today.  *If you need a refill on your cardiac medications before your next appointment, please call your pharmacy*  Lab Work: NONE If you have labs (blood work) drawn today and your tests are completely normal, you will receive your results only by: MyChart Message (if you have MyChart) OR A paper copy in the mail If you have any lab test that is abnormal or we need to change your treatment, we will call you to review the results.  Testing/Procedures: NONE  Follow-Up: At [facility], you and your health needs are our priority.  As part of our continuing mission to provide you with exceptional heart care, our providers are all part of one team.  This team includes your primary Cardiologist (physician) and Advanced Practice Providers or APPs (Physician Assistants and Nurse Practitioners) who all work together to provide you with the care you need, when you need it.  Your next appointment:   6 month(s)  Provider:   Annabella Scarce, MD or Josefa Beauvais, NP  We recommend signing up for the patient portal called MyChart.  Sign up information is provided on this After Visit Summary.  MyChart is used to connect with patients for Virtual Visits (Telemedicine).  Patients are able to view lab/test results, encounter notes, upcoming appointments, etc.  Non-urgent messages can be sent to your provider as well.   To learn more about what you can do with MyChart, go to [email or web address].   Other Instructions  Lifestyle Adjustments: Elevate your head while sleeping: Raise the head of your bed by 6-8 inches using bed risers or a wedge pillow. This helps prevent stomach acid from flowing back into the esophagus while you sleep.  2. Eat smaller, more frequent meals: Instead of large meals, opt for smaller portions  throughout the day.  3. Stop eating at least 4 hours before bedtime: This allows your stomach time to empty before you lie down, reducing the chance of acid reflux.  4. Avoid lying down immediately after eating: Give your body time to digest before reclining.  5. Maintain a healthy weight: Excess weight can put pressure on your stomach, increasing the likelihood of reflux.  6. Wear loose-fitting clothing: Avoid tight-fitting clothes, especially around the waist, as they can put pressure on the stomach.  7. Manage stress: Stress can exacerbate GERD symptoms.  Chew gum: Chewing gum after meals can help increase saliva production, which neutralizes stomach acid.    GERD in Adults: Diet Changes When you have gastroesophageal reflux disease (GERD), you may need to make changes to your diet. Choosing the right foods can help with your symptoms. Think about working with an expert in healthy eating called a dietitian. They can help you make healthy food choices. What are tips for following this plan? Reading food labels Look for foods that are low in saturated fat. Foods that may help with your symptoms include: Foods with less than 5% of daily value (DV) of fat. Foods with 0 grams of trans fat. Cooking [employer] in ways that don't use a lot of fat. These ways include: Baking. Steaming. Grilling. Broiling. To add flavor, try to use herbs that are low in spice and acidity. Avoid frying your food. Meal planning  Eat small meals often rather than eating 3 large meals each day. Eat your meals slowly in a place where you  feel relaxed. If told by your health care provider, avoid: Foods that cause symptoms. Keep a food diary to keep track of foods that cause symptoms. Alcohol. Drinking a lot of liquid with meals. General instructions For 2-3 hours after you eat, avoid: Bending over. Exercise. Lying down. Chew sugar-free gum after meals. What foods should I eat? Eat a healthy diet. Try to  include: Foods with high amounts of fiber. These include: Fruits and vegetables. Whole grains and beans. Low-fat dairy products. Lean meats, fish, and poultry. Egg whites. Foods that cause symptoms in someone else may not cause symptoms for you. Work with your provider to find foods that are safe for you. The items listed above may not be all the foods and drinks you can have. Talk with a dietitian to learn more. The items listed above may not be a complete list of foods and beverages you can eat and drink. Contact a dietitian for more information. What foods should I avoid? Limiting some of these foods may help with your symptoms. Each person is different. Talk with a dietitian or your provider to help you find the exact foods to avoid. Some of the foods to avoid may include: Fruits Fruits with a lot of acid in them. These may include citrus fruits, such as oranges, grapefruit, pineapple, and lemons. Vegetables Deep-fried vegetables, such as [place] fries. Vegetables, sauces, or toppings made with added fat and vegetables with acid in them. These may include tomatoes and tomato products, chili peppers, onions, garlic, and horseradish. Grains Pastries or quick breads with added fat. Meats and other proteins High-fat meats, such as fatty beef or pork, hot dogs, ribs, ham, sausage, salami, and bacon. Fried meat or protein, such as fried fish and fried chicken. Egg yolks. Fats and oils Butter. Margarine. Shortening. Ghee. Drinks Coffee and other drinks with caffeine in them. Fizzy and sugary drinks, such as soda and energy drinks. Fruit juice made with acidic fruits, such as orange or grapefruit. Tomato juice. Sweets and desserts Chocolate and cocoa. Donuts. Seasonings and condiments Mint, such as peppermint and spearmint. Condiments, herbs, or seasonings that cause symptoms. These may include curry, hot sauce, or vinegar-based salad dressings. The items listed above may not be all the  foods and drinks you should avoid. Talk with a dietitian to learn more. Questions to ask your health care provider Changes to your diet and everyday life are often the first steps taken to manage symptoms of GERD. If these changes don't help, talk with your provider about taking medicines. Where to find more information [employer] G[employer]intestinal [employer]: aboutgerd.org This information is not intended to replace advice given to you by your health care provider. Make sure you discuss any questions you have with your health care provider. Document Revised: 09/09/2023 Document Reviewed: 03/26/2023 Elsevier Patient Education  2024 [employer].

## 2024-07-01 ENCOUNTER — Other Ambulatory Visit

## 2024-07-01 ENCOUNTER — Encounter

## 2024-07-06 ENCOUNTER — Ambulatory Visit: Payer: Self-pay | Admitting: Obstetrics and Gynecology

## 2024-07-06 ENCOUNTER — Ambulatory Visit
Admission: RE | Admit: 2024-07-06 | Discharge: 2024-07-06 | Disposition: A | Discharge status: Home / Self Care | Source: Ambulatory Visit | Attending: Obstetrics and Gynecology | Admitting: Obstetrics and Gynecology

## 2024-07-06 ENCOUNTER — Ambulatory Visit

## 2024-07-06 ENCOUNTER — Other Ambulatory Visit: Payer: Self-pay | Admitting: Obstetrics and Gynecology

## 2024-07-06 DIAGNOSIS — N6310 Unspecified lump in the right breast, unspecified quadrant: Secondary | ICD-10-CM

## 2024-07-06 DIAGNOSIS — N644 Mastodynia: Secondary | ICD-10-CM

## 2024-07-09 ENCOUNTER — Other Ambulatory Visit: Payer: Self-pay | Admitting: Obstetrics and Gynecology

## 2024-07-09 ENCOUNTER — Ambulatory Visit
Admission: RE | Admit: 2024-07-09 | Discharge: 2024-07-09 | Disposition: A | Discharge status: Home / Self Care | Source: Ambulatory Visit | Attending: Obstetrics and Gynecology | Admitting: Obstetrics and Gynecology

## 2024-07-09 DIAGNOSIS — N644 Mastodynia: Secondary | ICD-10-CM

## 2024-07-09 DIAGNOSIS — N6310 Unspecified lump in the right breast, unspecified quadrant: Secondary | ICD-10-CM

## 2024-07-09 HISTORY — PX: BREAST BIOPSY: SHX20

## 2024-07-13 LAB — SURGICAL PATHOLOGY

## 2024-07-14 ENCOUNTER — Telehealth: Payer: Self-pay | Admitting: *Deleted

## 2024-07-14 NOTE — Telephone Encounter (Signed)
 Spoke to patient to confirm upcoming afternoon [facility] clinic appointment on 9/10, paperwork will be sent via email  Gave location and time, also informed patient that the surgeon's office would be calling as well to get information from them similar to the packet that they will be receiving so make sure to do both.  Reminded patient that all providers will be coming to the clinic to see them HERE and if they had any questions to not hesitate to reach back out to myself or their navigators.

## 2024-07-16 ENCOUNTER — Encounter: Payer: Self-pay | Admitting: *Deleted

## 2024-07-16 DIAGNOSIS — Z171 Estrogen receptor negative status [ER-]: Secondary | ICD-10-CM | POA: Insufficient documentation

## 2024-07-16 DIAGNOSIS — C50311 Malignant neoplasm of lower-inner quadrant of right female breast: Secondary | ICD-10-CM | POA: Insufficient documentation

## 2024-07-19 ENCOUNTER — Encounter: Payer: Self-pay | Admitting: *Deleted

## 2024-07-19 DIAGNOSIS — C50311 Malignant neoplasm of lower-inner quadrant of right female breast: Secondary | ICD-10-CM

## 2024-07-20 NOTE — Progress Notes (Signed)
 Radiation Oncology         [phone] ________________________________  Multidisciplinary Breast Oncology Clinic [facility]) Initial Outpatient Consultation  Name: Margaret [name] MRN: [id number]  Date: 07/21/2024  DOB: 01-29-1951  RR:Dfpuy, Arnulfo, MD  Curvin Deward MOULD, MD   REFERRING PHYSICIAN: Curvin Deward III, MD  DIAGNOSIS: There were no encounter diagnoses.  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  HISTORY OF PRESENT ILLNESS::Margaret [name] is a 73 y.o. female who is presenting to the office today for evaluation of her newly diagnosed right breast cancer. She is accompanied by ***. She is doing well overall.   She presented to medical attention last month with several weeks of bilateral non-focal breast pain radiating from the axillae into each breast. She subsequently underwent a bilateral diagnostic mammography with tomography and right breast ultrasonography [facility] on 07[facility]06[facility]24 which demonstrated: a suspicious 8 mm mass in the 5:30 o'clock right breast located 6 cmfn. This was demonstrated on the sonographic portion of this exam and correlated with a mammographic asymmetry. Imaging otherwise showed no evidence of malignancy in the left breast, right axillary lymphadenopathy, and no discernable etiology to account for the reported bilateral non-focal breast pain.   Biopsy of the right breast mass on 06/29/24 showed: grade 2 invasive mammary/ductal carcinoma measuring 8 mm in the greatest linear extent of the sample, with intermediate to high-grade ductal carcinoma in situ. (Of note: initial pathology prior to e-cadherin analysis showed invasive mammary carcinoma with lobular features). Prognostic indicators significant for: estrogen receptor, 100% positive with strong staining intensity and progesterone receptor, 0% negative. Proliferation marker Ki67 at 25%. HER2  negative. No lymph nodes were examined.   Menarche: *** years old Age at first live birth: *** years old GP: *** LMP: *** Contraceptive: *** HRT: ***   The patient was referred today for presentation in the multidisciplinary conference.  Radiology studies and pathology slides were presented there for review and discussion of treatment options.  A consensus was discussed regarding potential next steps.  PREVIOUS RADIATION THERAPY: No  PAST MEDICAL HISTORY:  Past Medical History:  Diagnosis Date   Allergy    Anemia    CAD in native artery 11/27/2023   Colitis    Dyspareunia    Endometriosis    GERD (gastroesophageal reflux disease)    High cholesterol    Hx of abnormal Pap smear 1980's   Hypertension    Osteopenia    Primary hypertension 11/27/2013   Ulcer    Urinary incontinence     PAST SURGICAL HISTORY: Past Surgical History:  Procedure Laterality Date   ABDOMINAL HYSTERECTOMY  09/11/2008   TVH/BSO--Dr. Nikki with TVT   BILATERAL SALPINGOOPHORECTOMY     2009   BLADDER SUSPENSION  09/11/2008   Dr. Nikki   BREAST BIOPSY Right 07/09/2024   US  RT BREAST BX W LOC DEV 1ST LESION IMG BX SPEC US  GUIDE 07/09/2024 [facility]   CERVIX LESION DESTRUCTION  11/11/1978   hx abnormal pap--dysplasia   COLONOSCOPY WITH PROPOFOL  Left 10/25/2017   Procedure: COLONOSCOPY WITH PROPOFOL ;  Surgeon: Saintclair Jasper, MD;  Location: [facility];  Service: Gastroenterology;  Laterality: Left;    FAMILY HISTORY:  Family History  Problem Relation Age of Onset   Diabetes Mother    Hypertension Mother    Thyroid  disease Sister    Lung cancer Brother    Seizures Brother  Diabetes Brother    Hypertension Brother    Diabetes Brother    Breast cancer Cousin    Arthritis Other    Diabetes Other     SOCIAL HISTORY:  Social History   Socioeconomic History   Marital status: Single    Spouse name: Not on file   Number of children: Not on file   Years of education: college    Highest education level: Not on file  Occupational History   Not on file  Tobacco Use   Smoking status: Never   Smokeless tobacco: Never  Vaping Use   Vaping status: Never Used  Substance and Sexual Activity   Alcohol use: Yes    Comment: rare, social   Drug use: No   Sexual activity: Not Currently    Birth control/protection: Surgical    Comment: TAH/BSO, more than 5, after 18, no STD, no abnormal pap, no DES  Other Topics Concern   Not on file  Social History Narrative   Not on file   Social Drivers of Health   Financial Resource Strain: Not on file  Food Insecurity: No Food Insecurity (12/19/2023)   Hunger Vital Sign    Worried About Running Out of Food in the Last Year: Never true    Ran Out of Food in the Last Year: Never true  Transportation Needs: No Transportation Needs (12/19/2023)   PRAPARE - Administrator, Civil Service (Medical): No    Lack of Transportation (Non-Medical): No  Physical Activity: Inactive (11/27/2023)   Exercise Vital Sign    Days of Exercise per Week: 0 days    Minutes of Exercise per Session: 0 min  Stress: Not on file  Social Connections: Not on file    ALLERGIES:  Allergies  Allergen Reactions   Amoxicillin -Pot Clavulanate Nausea And Vomiting and Other (See Comments)   Ciprofloxacin Nausea And Vomiting and Other (See Comments)    Other reaction(s): GI bleeding   Levofloxacin Other (See Comments)    Other reaction(s): itchy,achey bones   Levonorgestrel-Ethinyl Estrad Other (See Comments)   Macrobid [Nitrofurantoin] Itching   Pantoprazole  Sodium Other (See Comments)    Other reaction(s): itching, joint pains   Sulfa Antibiotics Other (See Comments)    Unknown Other reaction(s): do not remember    MEDICATIONS:  Current Outpatient Medications  Medication Sig Dispense Refill   Cholecalciferol  (VITAMIN D ) 2000 UNITS CAPS Take 6,000 Units by mouth daily.     ciclopirox  (PENLAC ) 8 % solution Apply topically at bedtime. Apply  over nail and surrounding skin. Apply daily over previous coat. After seven (7) days, may remove with alcohol and continue cycle. 6.6 mL 3   diclofenac  Sodium (VOLTAREN ) 1 % GEL Apply 4 g topically 4 (four) times daily. 100 g 0   ezetimibe  (ZETIA ) 10 MG tablet Take 1 tablet (10 mg total) by mouth daily. 90 tablet 3   Ferrous Sulfate (IRON) 325 (65 Fe) MG TABS 1 tablet Orally Once a day     fluticasone (FLONASE SENSIMIST) 27.5 MCG/SPRAY nasal spray 2-4 sprays (1-2 sprays in each nostril) Nasally Once a day for 30 days     fluticasone (FLONASE) 50 MCG/ACT nasal spray Place 1 spray into both nostrils daily as needed for allergies or rhinitis.     hydrochlorothiazide  (HYDRODIURIL ) 12.5 MG tablet Take 1 tablet (12.5 mg total) by mouth daily. 90 tablet 3   nitroGLYCERIN  (NITROSTAT ) 0.4 MG SL tablet Place 1 tablet (0.4 mg total) under the tongue every 5 (five)  minutes as needed for chest pain. 30 tablet 3   Olopatadine HCl (PATADAY) 0.2 % SOLN      Omega-3 Fatty Acids (FISH OIL) 1000 MG CPDR Take by mouth.     omeprazole  (PRILOSEC) 20 MG capsule Take 20 mg by mouth daily.     rosuvastatin  (CRESTOR ) 5 MG tablet Take 1 tablet by mouth 2 to 3 days per week 90 tablet 3   Safety Seal Miscellaneous MISC Solution Minoxidil 8% Clobetasol USP 0.05% Finasteride USP 0.5% - apply to affected areas of the scalp every morning. Apply with a cotton ball or a Q-tip. 30 mL 5   terbinafine  (LAMISIL ) 250 MG tablet Take 1 tablet (250 mg total) by mouth daily. 90 tablet 0   No current facility-administered medications for this encounter.    REVIEW OF SYSTEMS: A 10+ POINT REVIEW OF SYSTEMS WAS OBTAINED including neurology, dermatology, psychiatry, cardiac, respiratory, lymph, extremities, GI, GU, musculoskeletal, constitutional, reproductive, HEENT. On the provided form, she reports ***. She denies *** and any other symptoms.    PHYSICAL EXAM:  vitals were not taken for this visit.  {may need to copy over vitals} Lungs are  clear to auscultation bilaterally. Heart has regular rate and rhythm. No palpable cervical, supraclavicular, or axillary adenopathy. Abdomen soft, non-tender, normal bowel sounds. Breast: Left breast with no palpable mass, nipple discharge, or bleeding. Right breast with ***.   KPS = ***  100 - Normal; no complaints; no evidence of disease. 90   - Able to carry on normal activity; minor signs or symptoms of disease. 80   - Normal activity with effort; some signs or symptoms of disease. 40   - Cares for self; unable to carry on normal activity or to do active work. 60   - Requires occasional assistance, but is able to care for most of his personal needs. 50   - Requires considerable assistance and frequent medical care. 40   - Disabled; requires special care and assistance. 30   - Severely disabled; hospital admission is indicated although death not imminent. 20   - Very sick; hospital admission necessary; active supportive treatment necessary. 10   - Moribund; fatal processes progressing rapidly. 0     - Dead  Karnofsky DA, Abelmann WH, Craver LS and Burchenal [facility] [phone]) The use of the nitrogen mustards in the palliative treatment of carcinoma: with particular reference to bronchogenic carcinoma Cancer 1 [id number]  LABORATORY DATA:  Lab Results  Component Value Date   WBC 5.0 06/17/2024   HGB 11.7 (L) 06/17/2024   HCT 36.9 06/17/2024   MCV 83.5 06/17/2024   PLT 228 06/17/2024   Lab Results  Component Value Date   NA 141 06/17/2024   K 3.5 06/17/2024   CL 103 06/17/2024   CO2 29 06/17/2024   Lab Results  Component Value Date   ALT 13 06/17/2024   AST 23 06/17/2024   ALKPHOS 88 06/17/2024   BILITOT 0.2 06/17/2024    PULMONARY FUNCTION TEST:   Review Flowsheet        No data to display          RADIOGRAPHY: MM 3D DIAGNOSTIC MAMMOGRAM BILATERAL BREAST Addendum Date: 07/14/2024 ADDENDUM REPORT: 07/14/2024 08:54 ADDENDUM: RIGHT axillary ultrasound was performed at the time  of the diagnostic workup. No lymphadenopathy was seen. Representative pictures were taken. Electronically Signed   By: Norleen Croak M.D.   On: 07/14/2024 08:54   Result Date: 07/14/2024 CLINICAL DATA:  BILATERAL nonfocal breast pain beginning  in the axillae and radiating into the breast. Has lasted several weeks but is improving. Otherwise, no complaints. Due for annual. EXAM: DIGITAL DIAGNOSTIC BILATERAL MAMMOGRAM WITH TOMOSYNTHESIS AND CAD; ULTRASOUND RIGHT BREAST LIMITED TECHNIQUE: Bilateral digital diagnostic mammography and breast tomosynthesis was performed. The images were evaluated with computer-aided detection. ; Targeted ultrasound examination of the right breast was performed COMPARISON:  Previous exam(s). ACR Breast Density Category b: There are scattered areas of fibroglandular density. FINDINGS: No mammographic etiology for BILATERAL nonfocal breast pain. No mammographic evidence of malignancy in the LEFT breast. New asymmetry in the lower-inner quadrant of the RIGHT breast measuring 11 mm. Possible correlate on inferior MLO view slice 46/84. A questioned asymmetry in the superior MLO view resolves with additional imaging, compatible with superimposition of normal breast tissue. Targeted ultrasound was performed. In the RIGHT breast 5:30 position 6 cm from the nipple there is an irregular, hypoechoic mass with indistinct margins measuring 8 x 7 x 6 mm. This correlates with the mammographic finding. IMPRESSION: 1. Suspicious 8 mm mass in the RIGHT breast at 5:30 o'clock 6 cm from the nipple, correlating with the new asymmetry seen on mammography. 2. No mammographic etiology for BILATERAL nonfocal breast pain. No mammographic evidence of malignancy in the LEFT breast. RECOMMENDATION: Ultrasound-guided biopsy of the RIGHT breast x1 I have discussed the findings and recommendations with the patient. The biopsy procedure was discussed with the patient and questions were answered. Patient expressed their  understanding of the biopsy recommendation. Patient will be scheduled for biopsy at her earliest convenience by the schedulers. Ordering provider will be notified. If applicable, a reminder letter will be sent to the patient regarding the next appointment. BI-RADS CATEGORY  4: Suspicious. Electronically Signed: By: Norleen Croak M.D. On: 07/06/2024 19:03   US  LIMITED ULTRASOUND INCLUDING AXILLA RIGHT BREAST Addendum Date: 07/14/2024 ADDENDUM REPORT: 07/14/2024 08:54 ADDENDUM: RIGHT axillary ultrasound was performed at the time of the diagnostic workup. No lymphadenopathy was seen. Representative pictures were taken. Electronically Signed   By: Norleen Croak M.D.   On: 07/14/2024 08:54   Result Date: 07/14/2024 CLINICAL DATA:  BILATERAL nonfocal breast pain beginning in the axillae and radiating into the breast. Has lasted several weeks but is improving. Otherwise, no complaints. Due for annual. EXAM: DIGITAL DIAGNOSTIC BILATERAL MAMMOGRAM WITH TOMOSYNTHESIS AND CAD; ULTRASOUND RIGHT BREAST LIMITED TECHNIQUE: Bilateral digital diagnostic mammography and breast tomosynthesis was performed. The images were evaluated with computer-aided detection. ; Targeted ultrasound examination of the right breast was performed COMPARISON:  Previous exam(s). ACR Breast Density Category b: There are scattered areas of fibroglandular density. FINDINGS: No mammographic etiology for BILATERAL nonfocal breast pain. No mammographic evidence of malignancy in the LEFT breast. New asymmetry in the lower-inner quadrant of the RIGHT breast measuring 11 mm. Possible correlate on inferior MLO view slice 46/84. A questioned asymmetry in the superior MLO view resolves with additional imaging, compatible with superimposition of normal breast tissue. Targeted ultrasound was performed. In the RIGHT breast 5:30 position 6 cm from the nipple there is an irregular, hypoechoic mass with indistinct margins measuring 8 x 7 x 6 mm. This correlates with the  mammographic finding. IMPRESSION: 1. Suspicious 8 mm mass in the RIGHT breast at 5:30 o'clock 6 cm from the nipple, correlating with the new asymmetry seen on mammography. 2. No mammographic etiology for BILATERAL nonfocal breast pain. No mammographic evidence of malignancy in the LEFT breast. RECOMMENDATION: Ultrasound-guided biopsy of the RIGHT breast x1 I have discussed the findings and recommendations with  the patient. The biopsy procedure was discussed with the patient and questions were answered. Patient expressed their understanding of the biopsy recommendation. Patient will be scheduled for biopsy at her earliest convenience by the schedulers. Ordering provider will be notified. If applicable, a reminder letter will be sent to the patient regarding the next appointment. BI-RADS CATEGORY  4: Suspicious. Electronically Signed: By: Norleen Croak M.D. On: 07/06/2024 19:03   US  RT BREAST BX W LOC DEV 1ST LESION IMG BX SPEC US  GUIDE Addendum Date: 07/13/2024 ADDENDUM REPORT: 07/13/2024 13:09 ADDENDUM: Pathology revealed GRADE II INVASIVE MAMMARY CARCINOMA WITH LOBULAR FEATURES, MAMMARY CARCINOMA IN SITU, SOLID, INTERMEDIATE TO HIGH NUCLEAR GRADE, LYMPHOVASCULAR INVASION: NOT IDENTIFIED CALCIFICATIONS: NOT IDENTIFIED OTHER FINDINGS: NONE of the RIGHT breast, 0.8 cm mass, 5:30 position, (coil clip). This was found to be concordant by Dr. Chyrl Phi. Pathology results were discussed with the patient by telephone. The patient reported doing well after the biopsy with tenderness at the site. Post biopsy instructions and care were reviewed and questions were answered. The patient was encouraged to call [facility] for any additional concerns. My direct phone number was provided. The patient was referred to [facility] on July 21, 2024. Pathology results reported by Hendricks Benders, RN on 07/13/2024. Electronically  Signed   By: Reyes Phi M.D.   On: 07/13/2024 13:09   Result Date: 07/13/2024 CLINICAL DATA:  73 year old female presents for ultrasound-guided biopsy of RIGHT breast mass. EXAM: ULTRASOUND GUIDED RIGHT BREAST CORE NEEDLE BIOPSY COMPARISON:  Previous exam(s). PROCEDURE: I met with the patient and we discussed the procedure of ultrasound-guided biopsy, including benefits and alternatives. We discussed the high likelihood of a successful procedure. We discussed the risks of the procedure, including infection, bleeding, tissue injury, clip migration, and inadequate sampling. Informed written consent was given. The usual time-out protocol was performed immediately prior to the procedure. Lesion quadrant: LOWER INNER RIGHT breast Using sterile technique and 1% Lidocaine  with and without epinephrine as local anesthetic, under direct ultrasound visualization, a 12 gauge spring-loaded device was used to perform biopsy of the 0.8 cm mass at the 5:30 position using a LATERAL approach. At the conclusion of the procedure a COIL shaped tissue marker clip was deployed into the biopsy cavity. Follow up 2 view mammogram was performed and dictated separately. IMPRESSION: Ultrasound guided biopsy of 0.8 cm LOWER INNER RIGHT breast mass. No apparent complications. Electronically Signed: By: Reyes Phi M.D. On: 07/09/2024 12:27   MM CLIP PLACEMENT RIGHT Result Date: 07/09/2024 CLINICAL DATA:  Evaluate COIL biopsy clip placement following ultrasound-guided RIGHT breast biopsy. EXAM: 3D DIAGNOSTIC RIGHT MAMMOGRAM POST ULTRASOUND BIOPSY COMPARISON:  Previous exam(s). ACR Breast Density Category b: There are scattered areas of fibroglandular density. FINDINGS: 3D Mammographic images were obtained following ultrasound guided biopsy of 0.8 cm mass at the 5:30 position of the RIGHT breast. The COIL biopsy marking clip is in expected position at the site of biopsy. IMPRESSION: Appropriate positioning of the COIL shaped biopsy marking  clip at the site of biopsy in the LOWER INNER RIGHT breast. Final Assessment: Post Procedure Mammograms for Marker Placement Electronically Signed   By: Reyes Phi M.D.   On: 07/09/2024 12:41      IMPRESSION: ***   Patient will be a good candidate for breast conservation with radiotherapy to the right breast. We discussed the general course of radiation, potential side effects, and toxicities with radiation and the  patient is interested in this approach. ***   PLAN:  ***    ------------------------------------------------  Lynwood CHARM Nasuti, PhD, MD  This document serves as a record of services personally performed by Lynwood Nasuti, MD. It was created on his behalf by Dorthy Fuse, a trained medical scribe. The creation of this record is based on the scribe's personal observations and the provider's statements to them. This document has been checked and approved by the attending provider.

## 2024-07-21 ENCOUNTER — Inpatient Hospital Stay (HOSPITAL_BASED_OUTPATIENT_CLINIC_OR_DEPARTMENT_OTHER): Admitting: Genetic Counselor

## 2024-07-21 ENCOUNTER — Encounter: Payer: Self-pay | Admitting: Physical Therapy

## 2024-07-21 ENCOUNTER — Ambulatory Visit: Attending: General Surgery | Admitting: Physical Therapy

## 2024-07-21 ENCOUNTER — Ambulatory Visit: Payer: Self-pay | Admitting: General Surgery

## 2024-07-21 ENCOUNTER — Encounter: Payer: Self-pay | Admitting: General Practice

## 2024-07-21 ENCOUNTER — Inpatient Hospital Stay (HOSPITAL_BASED_OUTPATIENT_CLINIC_OR_DEPARTMENT_OTHER): Admitting: Hematology and Oncology

## 2024-07-21 ENCOUNTER — Other Ambulatory Visit: Payer: Self-pay

## 2024-07-21 ENCOUNTER — Encounter: Payer: Self-pay | Admitting: *Deleted

## 2024-07-21 ENCOUNTER — Inpatient Hospital Stay: Attending: Internal Medicine

## 2024-07-21 ENCOUNTER — Ambulatory Visit
Admission: RE | Admit: 2024-07-21 | Discharge: 2024-07-21 | Disposition: A | Discharge status: Home / Self Care | Source: Ambulatory Visit | Attending: Radiation Oncology | Admitting: Radiation Oncology

## 2024-07-21 VITALS — BP 134/70 | HR 78 | Temp 98.3°F | Resp 16 | Ht 66.0 in | Wt 175.5 lb

## 2024-07-21 DIAGNOSIS — C50311 Malignant neoplasm of lower-inner quadrant of right female breast: Secondary | ICD-10-CM | POA: Insufficient documentation

## 2024-07-21 DIAGNOSIS — Z170 Estrogen receptor positive status [ER+]: Secondary | ICD-10-CM

## 2024-07-21 DIAGNOSIS — Z882 Allergy status to sulfonamides status: Secondary | ICD-10-CM | POA: Diagnosis not present

## 2024-07-21 DIAGNOSIS — Z8349 Family history of other endocrine, nutritional and metabolic diseases: Secondary | ICD-10-CM

## 2024-07-21 DIAGNOSIS — R293 Abnormal posture: Secondary | ICD-10-CM | POA: Insufficient documentation

## 2024-07-21 DIAGNOSIS — Z833 Family history of diabetes mellitus: Secondary | ICD-10-CM | POA: Insufficient documentation

## 2024-07-21 DIAGNOSIS — Z880 Allergy status to penicillin: Secondary | ICD-10-CM | POA: Diagnosis not present

## 2024-07-21 DIAGNOSIS — Z801 Family history of malignant neoplasm of trachea, bronchus and lung: Secondary | ICD-10-CM | POA: Diagnosis not present

## 2024-07-21 DIAGNOSIS — M8580 Other specified disorders of bone density and structure, unspecified site: Secondary | ICD-10-CM | POA: Insufficient documentation

## 2024-07-21 DIAGNOSIS — Z881 Allergy status to other antibiotic agents status: Secondary | ICD-10-CM | POA: Insufficient documentation

## 2024-07-21 DIAGNOSIS — N6489 Other specified disorders of breast: Secondary | ICD-10-CM

## 2024-07-21 DIAGNOSIS — Z803 Family history of malignant neoplasm of breast: Secondary | ICD-10-CM | POA: Insufficient documentation

## 2024-07-21 DIAGNOSIS — I10 Essential (primary) hypertension: Secondary | ICD-10-CM | POA: Insufficient documentation

## 2024-07-21 DIAGNOSIS — Z90722 Acquired absence of ovaries, bilateral: Secondary | ICD-10-CM | POA: Insufficient documentation

## 2024-07-21 DIAGNOSIS — Z17411 Hormone receptor positive with human epidermal growth factor receptor 2 negative status: Secondary | ICD-10-CM | POA: Diagnosis not present

## 2024-07-21 DIAGNOSIS — Z90710 Acquired absence of both cervix and uterus: Secondary | ICD-10-CM | POA: Insufficient documentation

## 2024-07-21 DIAGNOSIS — Z8261 Family history of arthritis: Secondary | ICD-10-CM | POA: Diagnosis not present

## 2024-07-21 DIAGNOSIS — Z79899 Other long term (current) drug therapy: Secondary | ICD-10-CM | POA: Insufficient documentation

## 2024-07-21 DIAGNOSIS — Z820 Family history of epilepsy and other diseases of the nervous system: Secondary | ICD-10-CM | POA: Diagnosis not present

## 2024-07-21 DIAGNOSIS — I2510 Atherosclerotic heart disease of native coronary artery without angina pectoris: Secondary | ICD-10-CM | POA: Diagnosis not present

## 2024-07-21 DIAGNOSIS — Z8249 Family history of ischemic heart disease and other diseases of the circulatory system: Secondary | ICD-10-CM

## 2024-07-21 LAB — CBC WITH DIFFERENTIAL (CANCER CENTER ONLY)
Abs Immature Granulocytes: 0.01 K/uL (ref 0.00–0.07)
Basophils Absolute: 0 K/uL (ref 0.0–0.1)
Basophils Relative: 1 %
Eosinophils Absolute: 0.2 K/uL (ref 0.0–0.5)
Eosinophils Relative: 4 %
HCT: 37.7 % (ref 36.0–46.0)
Hemoglobin: 11.7 g/dL — ABNORMAL LOW (ref 12.0–15.0)
Immature Granulocytes: 0 %
Lymphocytes Relative: 52 %
Lymphs Abs: 2.6 K/uL (ref 0.7–4.0)
MCH: 25.7 pg — ABNORMAL LOW (ref 26.0–34.0)
MCHC: 31 g/dL (ref 30.0–36.0)
MCV: 82.9 fL (ref 80.0–100.0)
Monocytes Absolute: 0.5 K/uL (ref 0.1–1.0)
Monocytes Relative: 10 %
Neutro Abs: 1.6 K/uL — ABNORMAL LOW (ref 1.7–7.7)
Neutrophils Relative %: 33 %
Platelet Count: 256 K/uL (ref 150–400)
RBC: 4.55 MIL/uL (ref 3.87–5.11)
RDW: 15.4 % (ref 11.5–15.5)
WBC Count: 5 K/uL (ref 4.0–10.5)
nRBC: 0 % (ref 0.0–0.2)

## 2024-07-21 LAB — GENETIC SCREENING ORDER

## 2024-07-21 LAB — CMP (CANCER CENTER ONLY)
ALT: 23 U/L (ref 0–44)
AST: 25 U/L (ref 15–41)
Albumin: 4.3 g/dL (ref 3.5–5.0)
Alkaline Phosphatase: 80 U/L (ref 38–126)
Anion gap: 5 (ref 5–15)
BUN: 15 mg/dL (ref 8–23)
CO2: 31 mmol/L (ref 22–32)
Calcium: 9.7 mg/dL (ref 8.9–10.3)
Chloride: 105 mmol/L (ref 98–111)
Creatinine: 0.71 mg/dL (ref 0.44–1.00)
GFR, Estimated: >60 mL/min (ref >60)
Glucose, Bld: 97 mg/dL (ref 70–99)
Potassium: 3.6 mmol/L (ref 3.5–5.1)
Sodium: 141 mmol/L (ref 135–145)
Total Bilirubin: 0.4 mg/dL (ref 0.0–1.2)
Total Protein: 7.5 g/dL (ref 6.5–8.1)

## 2024-07-21 NOTE — Research (Signed)
 [employer] 2021-05 - Specimen Collection Study to Evaluate Biomarkers in Subjects with Cancer     Patient Margaret [name] was identified by Dr. Gudena as a potential candidate for the above listed study.  This Clinical Research Coordinator met with EVALYNE CORTOPASSI, [id number], on 07/21/24 in a manner and location that ensures patient privacy to discuss participation in the above listed research study.  Patient is Accompanied by daughter.  A copy of the informed consent document with embedded HIPAA language was provided to the patient.  Patient reads, speaks, and understands [place].  Patient was provided with the business card of this Coordinator and encouraged to contact the research team with any questions.  Approximately 10 minutes were spent with the patient reviewing the informed consent documents.  Patient was provided the option of taking informed consent documents home to review and was encouraged to review at their convenience with their support network, including other care providers. Patient took the consent documents home to review. Will follow patient about study interest.  Laury Quale, MPH  Clinical Research Coordinator

## 2024-07-21 NOTE — Progress Notes (Signed)
 [facility] CONSULT NOTE  Patient Care Team: [name], MD as PCP - General (Family Medicine) Raford Riggs, MD as PCP - Cardiology (Cardiology) Cathlyn JAYSON Cary, Bobie BRAVO, MD as Consulting Physician (Obstetrics and Gynecology) Gerome, Devere HERO, RN as Oncology Nurse Navigator Tyree Nanetta SAILOR, RN as Oncology Nurse Navigator Curvin Deward MOULD, MD as Consulting Physician (General Surgery) Odean Potts, MD as Consulting Physician (Hematology and Oncology) Shannon Agent, MD as Consulting Physician (Radiation Oncology)  CHIEF COMPLAINTS/PURPOSE OF CONSULTATION:  Newly diagnosed breast cancer  HISTORY OF PRESENTING ILLNESS: [name] is a 73 year old who presented with complaints of bilateral breast pain.  She underwent a screening mammogram that showed right breast asymmetry at 5:30 position measuring 0.8 cm.  Axilla was negative.  Ultrasound-guided biopsy revealed grade 2 IDC with lobular features, no LVI, ER 100%, PR 0%, Ki67 25%, HER2 negative.  She was presented this morning to the multidisciplinary tumor board and she is here today accompanied by her daughter to discuss treatment options.  I reviewed her records extensively and collaborated the history with the patient.  SUMMARY OF ONCOLOGIC HISTORY: Oncology History  Malignant neoplasm of lower-inner quadrant of right breast of female, estrogen receptor positive (HCC)  07/09/2024 Initial Diagnosis   Bilateral breast pain evaluation: Mammogram detected right breast asymmetry at 5:30 position: 0.8 cm, axilla negative, ultrasound biopsy: Grade 2 IDC with lobular features, no LVI, ER 100%, PR 0%, Ki67 25%, HER2 negative   07/21/2024 Cancer Staging   Staging form: Breast, AJCC 8th Edition - Clinical stage from 07/21/2024: Stage IA (cT1b, cN0, cM0, G2, ER+, PR+, HER2-) - Signed by Odean Potts, MD on 07/21/2024 Stage prefix: Initial diagnosis Histologic grading system: 3 grade system      MEDICAL HISTORY:  Past  Medical History:  Diagnosis Date   Allergy    Anemia    Anxiety    CAD in native artery 11/27/2023   Colitis    Dyspareunia    Endometriosis    GERD (gastroesophageal reflux disease)    High cholesterol    Hx of abnormal Pap smear 1980's   Hypertension    Osteopenia    Primary hypertension 11/27/2013   Ulcer    Urinary incontinence     SURGICAL HISTORY: Past Surgical History:  Procedure Laterality Date   ABDOMINAL HYSTERECTOMY  09/11/2008   TVH/BSO--Dr. Cary with TVT   BILATERAL SALPINGOOPHORECTOMY     2009   BLADDER SUSPENSION  09/11/2008   Dr. Cary   BREAST BIOPSY Right 07/09/2024   US  RT BREAST BX W LOC DEV 1ST LESION IMG BX SPEC US  GUIDE 07/09/2024 [facility]   CERVIX LESION DESTRUCTION  11/11/1978   hx abnormal pap--dysplasia   COLONOSCOPY WITH PROPOFOL  Left 10/25/2017   Procedure: COLONOSCOPY WITH PROPOFOL ;  Surgeon: Saintclair Jasper, MD;  Location: [facility];  Service: Gastroenterology;  Laterality: Left;    SOCIAL HISTORY: Social History   Socioeconomic History   Marital status: Single    Spouse name: Not on file   Number of children: Not on file   Years of education: college   Highest education level: Not on file  Occupational History   Not on file  Tobacco Use   Smoking status: Never   Smokeless tobacco: Never  Vaping Use   Vaping status: Never Used  Substance and Sexual Activity   Alcohol use: Yes    Comment: rare, social   Drug use: No   Sexual activity: Not Currently    Birth control/protection: Surgical  Comment: TAH/BSO, more than 5, after 18, no STD, no abnormal pap, no DES  Other Topics Concern   Not on file  Social History Narrative   Not on file   Social Drivers of Health   Financial Resource Strain: Not on file  Food Insecurity: No Food Insecurity (12/19/2023)   Hunger Vital Sign    Worried About Running Out of Food in the Last Year: Never true    Ran Out of Food in the Last Year: Never true  Transportation Needs: No  Transportation Needs (12/19/2023)   PRAPARE - Administrator, Civil Service (Medical): No    Lack of Transportation (Non-Medical): No  Physical Activity: Inactive (11/27/2023)   Exercise Vital Sign    Days of Exercise per Week: 0 days    Minutes of Exercise per Session: 0 min  Stress: Not on file  Social Connections: Not on file  Intimate Partner Violence: Not At Risk (12/19/2023)   Humiliation, Afraid, Rape, and Kick questionnaire    Fear of Current or Ex-Partner: No    Emotionally Abused: No    Physically Abused: No    Sexually Abused: No    FAMILY HISTORY: Family History  Problem Relation Age of Onset   Diabetes Mother    Hypertension Mother    Thyroid  disease Sister    Lung cancer Brother    Seizures Brother    Diabetes Brother    Hypertension Brother    Diabetes Brother    Breast cancer Cousin    Arthritis Other    Diabetes Other     ALLERGIES:  is allergic to amoxicillin -pot clavulanate, ciprofloxacin, levofloxacin, levonorgestrel-ethinyl estrad, macrobid [nitrofurantoin], pantoprazole  sodium, and sulfa antibiotics.  MEDICATIONS:  Current Outpatient Medications  Medication Sig Dispense Refill   ezetimibe  (ZETIA ) 10 MG tablet Take 1 tablet (10 mg total) by mouth daily. 90 tablet 3   hydrochlorothiazide  (HYDRODIURIL ) 12.5 MG tablet Take 1 tablet (12.5 mg total) by mouth daily. 90 tablet 3   rosuvastatin  (CRESTOR ) 5 MG tablet Take 1 tablet by mouth 2 to 3 days per week 90 tablet 3   Cholecalciferol  (VITAMIN D ) 2000 UNITS CAPS Take 6,000 Units by mouth daily.     ciclopirox  (PENLAC ) 8 % solution Apply topically at bedtime. Apply over nail and surrounding skin. Apply daily over previous coat. After seven (7) days, may remove with alcohol and continue cycle. 6.6 mL 3   diclofenac  Sodium (VOLTAREN ) 1 % GEL Apply 4 g topically 4 (four) times daily. 100 g 0   Ferrous Sulfate (IRON) 325 (65 Fe) MG TABS 1 tablet Orally Once a day     fluticasone (FLONASE SENSIMIST) 27.5  MCG/SPRAY nasal spray 2-4 sprays (1-2 sprays in each nostril) Nasally Once a day for 30 days     fluticasone (FLONASE) 50 MCG/ACT nasal spray Place 1 spray into both nostrils daily as needed for allergies or rhinitis.     nitroGLYCERIN  (NITROSTAT ) 0.4 MG SL tablet Place 1 tablet (0.4 mg total) under the tongue every 5 (five) minutes as needed for chest pain. 30 tablet 3   Olopatadine HCl (PATADAY) 0.2 % SOLN      Omega-3 Fatty Acids (FISH OIL) 1000 MG CPDR Take by mouth.     omeprazole  (PRILOSEC) 20 MG capsule Take 20 mg by mouth daily.     Safety Seal Miscellaneous MISC Solution Minoxidil 8% Clobetasol USP 0.05% Finasteride USP 0.5% - apply to affected areas of the scalp every morning. Apply with a cotton ball or  a Q-tip. 30 mL 5   terbinafine  (LAMISIL ) 250 MG tablet Take 1 tablet (250 mg total) by mouth daily. 90 tablet 0   No current facility-administered medications for this visit.    REVIEW OF SYSTEMS:   Constitutional: Denies fevers, chills or abnormal night sweats Breast: Bilateral breast discomfort All other systems were reviewed with the patient and are negative.  PHYSICAL EXAMINATION: ECOG PERFORMANCE STATUS: 1 - Symptomatic but completely ambulatory  Vitals:   07/21/24 1333  BP: 134/70  Pulse: 78  Resp: 16  Temp: 98.3 F (36.8 C)  SpO2: 100%   Filed Weights   07/21/24 1333  Weight: 175 lb 8 oz (79.6 kg)    GENERAL:alert, no distress and comfortable    LABORATORY DATA:  I have reviewed the data as listed Lab Results  Component Value Date   WBC 5.0 07/21/2024   HGB 11.7 (L) 07/21/2024   HCT 37.7 07/21/2024   MCV 82.9 07/21/2024   PLT 256 07/21/2024   Lab Results  Component Value Date   NA 141 07/21/2024   K 3.6 07/21/2024   CL 105 07/21/2024   CO2 31 07/21/2024    RADIOGRAPHIC STUDIES: I have personally reviewed the radiological reports and agreed with the findings in the report.  ASSESSMENT AND PLAN:  Malignant neoplasm of lower-inner quadrant of  right breast of female, estrogen receptor positive (HCC) 07/09/2024: Bilateral breast pain evaluation: Mammogram detected right breast asymmetry at 5:30 position: 0.8 cm, axilla negative, ultrasound biopsy: Grade 2 IDC with lobular features, no LVI, ER 100%, PR 0%, Ki67 25%, HER2 negative  Pathology and radiology counseling:Discussed with the patient, the details of pathology including the type of breast cancer,the clinical staging, the significance of ER, PR and HER-2/neu receptors and the implications for treatment. After reviewing the pathology in detail, we proceeded to discuss the different treatment options between surgery, radiation, chemotherapy, antiestrogen therapies.  Recommendations: 1. Breast conserving surgery followed by 2. Oncotype DX testing to determine if chemotherapy would be of any benefit followed by 3. +/-Adjuvant radiation therapy followed by 4. Adjuvant antiestrogen therapy  Oncotype counseling: I discussed Oncotype DX test. I explained to the patient that this is a 21 gene panel to evaluate patient tumors DNA to calculate recurrence score. This would help determine whether patient has high risk or low risk breast cancer. She understands that if her tumor was found to be high risk, she would benefit from systemic chemotherapy. If low risk, no need of chemotherapy.  Return to clinic after surgery to discuss final pathology report and then determine if Oncotype DX testing will need to be sent.   All questions were answered. The patient knows to call the clinic with any problems, questions or concerns.    Viinay K Ticia Virgo, MD 07/21/24

## 2024-07-21 NOTE — Progress Notes (Signed)
 [facility] Spiritual Care Note  Met with Anabia and her daughter Alana, who lives in [place], in [facility] to introduce Support Center team/resources.  Ms Sipe completed SDOH screening; results follow below.  SDOH Interventions    Flowsheet Row Community Documentation from 12/19/2023 in [facility]  SDOH Interventions   Food Insecurity Interventions Intervention Not Indicated  Housing Interventions Intervention Not Indicated  Utilities Interventions Intervention Not Indicated    SDOH Screenings   Food Insecurity: No Food Insecurity (07/21/2024)  Housing: Low Risk  (07/21/2024)  Transportation Needs: No Transportation Needs (07/21/2024)  Utilities: Not At Risk (07/21/2024)  Depression (PHQ2-9): Low Risk  (07/21/2024)  Physical Activity: Inactive (11/27/2023)  Tobacco Use: Low Risk  (07/21/2024)   Received from [facility] and patient discussed common feelings and emotions when being diagnosed with cancer, and the importance of support during treatment.  Chaplain informed patient of the support team and support services at [facility].  Chaplain provided contact information and encouraged patient to call with any questions or concerns.  Follow up needed: Yes.  Placing an Sports coach referral per her request and plan to follow up with Ms Warren-Woodard by phone in ca two weeks for Spiritual Care check-in.   [address] Olam Corrigan, [place], [facility] Pager [phone] Voicemail [phone]

## 2024-07-21 NOTE — Assessment & Plan Note (Signed)
 07/09/2024: Bilateral breast pain evaluation: Mammogram detected right breast asymmetry at 5:30 position: 0.8 cm, axilla negative, ultrasound biopsy: Grade 2 IDC with lobular features, no LVI, ER 100%, PR 0%, Ki67 25%, HER2 negative  Pathology and radiology counseling:Discussed with the patient, the details of pathology including the type of breast cancer,the clinical staging, the significance of ER, PR and HER-2/neu receptors and the implications for treatment. After reviewing the pathology in detail, we proceeded to discuss the different treatment options between surgery, radiation, chemotherapy, antiestrogen therapies.  Recommendations: 1. Breast conserving surgery followed by 2. Oncotype DX testing to determine if chemotherapy would be of any benefit followed by 3. +/-Adjuvant radiation therapy followed by 4. Adjuvant antiestrogen therapy  Oncotype counseling: I discussed Oncotype DX test. I explained to the patient that this is a 21 gene panel to evaluate patient tumors DNA to calculate recurrence score. This would help determine whether patient has high risk or low risk breast cancer. She understands that if her tumor was found to be high risk, she would benefit from systemic chemotherapy. If low risk, no need of chemotherapy.  Return to clinic after surgery to discuss final pathology report and then determine if Oncotype DX testing will need to be sent.

## 2024-07-21 NOTE — Therapy (Signed)
 OUTPATIENT PHYSICAL THERAPY BREAST CANCER BASELINE EVALUATION   Patient Name: Margaret [name] MRN: [id number] DOB:1951-03-11, 74 y.o., female Today's Date: 07/21/2024  END OF SESSION:  PT End of Session - 07/21/24 1724     Visit Number 1    Number of Visits 2    Date for PT Re-Evaluation 09/15/24    PT Start Time 1414    PT Stop Time 1445    PT Time Calculation (min) 31 min    Activity Tolerance Patient tolerated treatment well    Behavior During Therapy [facility] for tasks assessed/performed          Past Medical History:  Diagnosis Date   Allergy    Anemia    Anxiety    CAD in native artery 11/27/2023   Colitis    Dyspareunia    Endometriosis    GERD (gastroesophageal reflux disease)    High cholesterol    Hx of abnormal Pap smear 1980's   Hypertension    Osteopenia    Primary hypertension 11/27/2013   Ulcer    Urinary incontinence    Past Surgical History:  Procedure Laterality Date   ABDOMINAL HYSTERECTOMY  09/11/2008   TVH/BSO--Dr. Nikki with TVT   BILATERAL SALPINGOOPHORECTOMY     2009   BLADDER SUSPENSION  09/11/2008   Dr. Nikki   BREAST BIOPSY Right 07/09/2024   US  RT BREAST BX W LOC DEV 1ST LESION IMG BX SPEC US  GUIDE 07/09/2024 [facility]   CERVIX LESION DESTRUCTION  11/11/1978   hx abnormal pap--dysplasia   COLONOSCOPY WITH PROPOFOL  Left 10/25/2017   Procedure: COLONOSCOPY WITH PROPOFOL ;  Surgeon: Saintclair Jasper, MD;  Location: [facility];  Service: Gastroenterology;  Laterality: Left;   Patient Active Problem List   Diagnosis Date Noted   Malignant neoplasm of lower-inner quadrant of right breast of female, estrogen receptor positive (HCC) 07/16/2024   Hyperlipidemia LDL goal <70 03/09/2024   CAD in native artery 11/27/2023   Gastroesophageal reflux disease 06/06/2020   Atypical chest pain 04/21/2020   Closed nondisplaced fracture of proximal phalanx of lesser toe of left foot 11/19/2017   GI bleed 10/23/2017   Hypokalemia 10/23/2017    Nausea vomiting and diarrhea 10/23/2017   Hematochezia 10/23/2017   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 Deward Null  REFERRING DIAG: Right breast cancer  THERAPY DIAG:  Malignant neoplasm of lower-inner quadrant of right breast of female, estrogen receptor positive (HCC)  Abnormal posture  Rationale for Evaluation and Treatment: Rehabilitation  ONSET DATE: 07/13/2024  SUBJECTIVE:  SUBJECTIVE STATEMENT: Patient reports she is here today to be seen by her medical team for her newly diagnosed right breast cancer.   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 It measures 1.1 cm and is located in the lower inner quadrant. It is ER positive, PR negative, and HER2 negative with a Ki67 of 25%.   PATIENT GOALS:   reduce lymphedema risk and learn post op HEP.   PAIN:  Are you having pain? Yes: NPRS scale: 6/10 Pain location: right thumb Pain description: tingling Aggravating factors: writing and using her hand Relieving factors: rest  PRECAUTIONS: Active CA   RED FLAGS: None   HAND DOMINANCE: right  WEIGHT BEARING RESTRICTIONS: No  FALLS:  Has patient fallen in last 6 months? No  LIVING ENVIRONMENT: Patient lives with: alone Lives in: House/apartment Has following equipment at home: None  OCCUPATION: retired  LEISURE: She walks 2x/day for 45 min  PRIOR LEVEL OF FUNCTION: Independent   OBJECTIVE: Note: Objective measures were completed at Evaluation unless otherwise noted.  COGNITION: Overall cognitive status: Within  functional limits for tasks assessed    POSTURE:  Forward head and rounded shoulders posture  UPPER EXTREMITY AROM/PROM:  A/PROM RIGHT   eval   Shoulder extension 43  Shoulder flexion 149  Shoulder abduction 155  Shoulder internal rotation 79  Shoulder external rotation 79    (Blank rows = not tested)  A/PROM LEFT   eval  Shoulder extension 53  Shoulder flexion 143  Shoulder abduction 161  Shoulder internal rotation 80  Shoulder external rotation 90    (Blank rows = not tested)  CERVICAL AROM: All within functional limits  UPPER EXTREMITY STRENGTH: WFL  LYMPHEDEMA ASSESSMENTS (in cm):   LANDMARK RIGHT   eval  10 cm proximal to olecranon process 30.9  Olecranon process 27.4  10 cm proximal to ulnar styloid process 24.1  Just proximal to ulnar styloid process 16.8  Across hand at thumb web space 19.4  At base of 2nd digit 5.9  (Blank rows = not tested)  LANDMARK LEFT   eval  10 cm proximal to olecranon process 30  Olecranon process 26.9  10 cm proximal to ulnar styloid process 22.4  Just proximal to ulnar styloid process 16.1  Across hand at thumb web space 19  At base of 2nd digit 6  (Blank rows = not tested)  L-DEX LYMPHEDEMA SCREENING:  The patient was assessed using the L-Dex machine today to produce a lymphedema index baseline score. The patient will be reassessed on a regular basis (typically every 3 months) to obtain new L-Dex scores. If the score is > 6.5 points away from his/her baseline score indicating onset of subclinical lymphedema, it will be recommended to wear a compression garment for 4 weeks, 12 hours per day and then be reassessed. If the score continues to be > 6.5 points from baseline at reassessment, we will initiate lymphedema treatment. Assessing in this manner has a 95% rate of preventing clinically significant lymphedema.   L-DEX FLOWSHEETS - 07/21/24 1700       L-DEX LYMPHEDEMA SCREENING   Measurement Type Unilateral    L-DEX  MEASUREMENT EXTREMITY Upper Extremity    POSITION  Standing    DOMINANT SIDE Right    At Risk Side Right    BASELINE SCORE (UNILATERAL) -3          QUICK DASH SURVEY:  Margaret [name] - 07/21/24 0001  Open a tight or new jar Mild difficulty    Do heavy household chores (wash walls, wash floors) No difficulty    Carry a shopping bag or briefcase No difficulty    Wash your back Mild difficulty    Use a knife to cut food No difficulty    Recreational activities in which you take some force or impact through your arm, shoulder, or hand (golf, hammering, tennis) Mild difficulty    During the past week, to what extent has your arm, shoulder or hand problem interfered with your normal social activities with family, friends, neighbors, or groups? Slightly    During the past week, to what extent has your arm, shoulder or hand problem limited your work or other regular daily activities Slightly    Arm, shoulder, or hand pain. Mild    Tingling (pins and needles) in your arm, shoulder, or hand Mild    Difficulty Sleeping Mild difficulty    DASH Score 18.18 %           PATIENT EDUCATION:  Education details: Time spent educating patient on aspects of self-care to maximize post op recovery. Patient was educated on where and how to get a post op compression bra to use to reduce post op edema. Patient was also educated on the use of SOZO screenings and surveillance principles for early identification of lymphedema onset. She was instructed to use the post op pillow in the axilla for pressure and pain relief. Patient educated on lymphedema risk reduction and post op shoulder/posture HEP. Person educated: Patient Education method: Explanation, Demonstration, Handout Education comprehension: Patient verbalized understanding and returned demonstration  HOME EXERCISE PROGRAM: Patient was instructed today in a home exercise program today for post op shoulder range of motion. These included active assist  shoulder flexion in sitting, scapular retraction, wall walking with shoulder abduction, and hands behind head external rotation.  She was encouraged to do these twice a day, holding 3 seconds and repeating 5 times when permitted by her physician.   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 It measures 1.1 cm and is located in the lower inner quadrant. It is ER positive, PR negative, and HER2 negative with a Ki67 of 25%. Her multidisciplinary medical team met prior to her assessments to determine a recommended treatment plan. She is planning to have a right lumpectomy and sentinel node biopsy followed by Oncotype testing, radiation, and anti-estrogen therapy. She will benefit from a post op PT reassessment to determine needs and from L-Dex screens every 3 months for 2 years to detect subclinical lymphedema.  Pt will benefit from skilled therapeutic intervention to improve on the following deficits: Decreased knowledge of precautions, impaired UE functional use, pain, decreased ROM, postural dysfunction.   PT treatment/interventions: ADL/self-care home management, pt/family education, therapeutic exercise  REHAB POTENTIAL: Excellent  CLINICAL DECISION MAKING: Stable/uncomplicated  EVALUATION COMPLEXITY: Low   GOALS: Goals reviewed with patient? YES  LONG TERM GOALS: (STG=LTG)    Name Target Date Goal status  1 Pt will be able to verbalize understanding of pertinent lymphedema risk reduction practices relevant to her dx specifically related to skin care.  Baseline:  No knowledge 07/21/2024 Achieved at eval  2 Pt will be able to return demo and/or verbalize understanding of the post op HEP related to regaining shoulder ROM. Baseline:  No knowledge 07/21/2024 Achieved at eval  3 Pt will be able to verbalize understanding of the importance  of viewing the post op After Breast CA Class video for further  lymphedema risk reduction education and therapeutic exercise.  Baseline:  No knowledge 07/21/2024 Achieved at eval  4 Pt will demo she has regained full shoulder ROM and function post operatively compared to baselines.  Baseline: See objective measurements taken today. 09/15/2024     PLAN:  PT FREQUENCY/DURATION: EVAL and 1 follow up appointment.   PLAN FOR NEXT SESSION: will reassess 3-4 weeks post op to determine needs.   Patient will follow up at outpatient cancer rehab 3-4 weeks following surgery.  If the patient requires physical therapy at that time, a specific plan will be dictated and sent to the referring physician for approval. The patient was educated today on appropriate basic range of motion exercises to begin post operatively and the importance of viewing the After Breast Cancer class video following surgery.  Patient was educated today on lymphedema risk reduction practices as it pertains to recommendations that will benefit the patient immediately following surgery.  She verbalized good understanding.    Physical Therapy Information for After Breast Cancer Surgery/Treatment:  Lymphedema is a swelling condition that you may be at risk for in your arm if you have lymph nodes removed from the armpit area.  After a sentinel node biopsy, the risk is approximately 5-9% and is higher after an axillary node dissection.  There is treatment available for this condition and it is not life-threatening.  Contact your physician or physical therapist with concerns. You may begin the 4 shoulder/posture exercises (see additional sheet) when permitted by your physician (typically a week after surgery).  If you have drains, you may need to wait until those are removed before beginning range of motion exercises.  A general recommendation is to not lift your arms above shoulder height until drains are removed.  These exercises should be done to your tolerance and gently.  This is not a no pain/no gain  type of recovery so listen to your body and stretch into the range of motion that you can tolerate, stopping if you have pain.  If you are having immediate reconstruction, ask your plastic surgeon about doing exercises as he or she may want you to wait. We encourage you to view the After Breast Cancer class video following surgery.  You will learn information related to lymphedema risk, prevention and treatment and additional exercises to regain mobility following surgery.   While undergoing any medical procedure or treatment, try to avoid blood pressure being taken or needle sticks from occurring on the arm on the side of cancer.   This recommendation begins after surgery and continues for the rest of your life.  This may help reduce your risk of getting lymphedema (swelling in your arm). An excellent resource for those seeking information on lymphedema is the National Lymphedema Network's web site. It can be accessed at www.lymphnet.org If you notice swelling in your hand, arm or breast at any time following surgery (even if it is many years from now), please contact your doctor or physical therapist to discuss this.  Lymphedema can be treated at any time but it is easier for you if it is treated early on.  If you feel like your shoulder motion is not returning to normal in a reasonable amount of time, please contact your surgeon or physical therapist.  [facility] [phone]. [address], [address], [place] [address]  [facility] CLASS After Breast Cancer Class  After Breast Cancer  Class is a specially designed exercise class video to assist you in a safe recover after having breast cancer surgery.  In this video you will learn how to get back to full function whether your drains were just removed or if you had surgery a month ago. The video can be viewed on this page: [email or web address].boyd-meyer.org/ or on YouTube  here: https://youtu.[email or web address].  Class Goals  Understand specific stretches to improve the flexibility of you chest and shoulder. Learn ways to safely strengthen your upper body and improve your posture. Understand the warning signs of infection and why you may be at risk for an arm infection. Learn about Lymphedema and prevention.  ** You do not need to view this video until after surgery.  Drains should be removed to participate in the recommended exercises on the video.  Patient was instructed today in a home exercise program today for post op shoulder range of motion. These included active assist shoulder flexion in sitting, scapular retraction, wall walking with shoulder abduction, and hands behind head external rotation.  She was encouraged to do these twice a day, holding 3 seconds and repeating 5 times when permitted by her physician.  Eward Wonda Sharps, [place] 07/21/24 5:44 PM

## 2024-07-22 ENCOUNTER — Ambulatory Visit: Payer: Self-pay | Admitting: Obstetrics and Gynecology

## 2024-07-22 ENCOUNTER — Encounter (HOSPITAL_BASED_OUTPATIENT_CLINIC_OR_DEPARTMENT_OTHER): Admitting: Cardiovascular Disease

## 2024-07-22 ENCOUNTER — Other Ambulatory Visit: Payer: Self-pay | Admitting: General Surgery

## 2024-07-22 ENCOUNTER — Encounter: Payer: Self-pay | Admitting: Genetic Counselor

## 2024-07-22 DIAGNOSIS — Z803 Family history of malignant neoplasm of breast: Secondary | ICD-10-CM | POA: Insufficient documentation

## 2024-07-22 DIAGNOSIS — C50311 Malignant neoplasm of lower-inner quadrant of right female breast: Secondary | ICD-10-CM

## 2024-07-22 NOTE — Progress Notes (Addendum)
 REFERRING PROVIDER: Odean Potts, MD [address] [place],  [place] [address]  PRIMARY PROVIDER:  [name], MD  PRIMARY REASON FOR VISIT:  1. Family history of breast cancer   2. Malignant neoplasm of lower-inner quadrant of right breast of female, estrogen receptor positive (HCC)      HISTORY OF PRESENT ILLNESS:   Ms. Naron, a 73 y.o. female, was seen for a [facility] cancer genetics consultation at the request of Dr. Odean due to a personal and family history of breast cancer.  Ms. Plantz presents to clinic today to discuss the possibility of a hereditary predisposition to cancer, genetic testing, and to further clarify her future cancer risks, as well as potential cancer risks for family members.   In August 2025, at the age of 19, [name] was diagnosed with cancer of the right breast.     CANCER HISTORY:  Oncology History  Malignant neoplasm of lower-inner quadrant of right breast of female, estrogen receptor positive (HCC)  07/09/2024 Initial Diagnosis   Bilateral breast pain evaluation: Mammogram detected right breast asymmetry at 5:30 position: 0.8 cm, axilla negative, ultrasound biopsy: Grade 2 IDC with lobular features, no LVI, ER 100%, PR 0%, Ki67 25%, HER2 negative   07/21/2024 Cancer Staging   Staging form: Breast, AJCC 8th Edition - Clinical stage from 07/21/2024: Stage IA (cT1b, cN0, cM0, G2, ER+, PR+, HER2-) - Signed by Odean Potts, MD on 07/21/2024 Stage prefix: Initial diagnosis Histologic grading system: 3 grade system      RISK FACTORS:  Menarche was at age 22.  First live birth at age 23.  OCP use for approximately 0 years.   Past Medical History:  Diagnosis Date   Allergy    Anemia    Anxiety    CAD in native artery 11/27/2023   Colitis    Dyspareunia    Endometriosis    Family history of breast cancer    GERD (gastroesophageal reflux disease)    High cholesterol    Hx of abnormal Pap smear 1980's    Hypertension    Osteopenia    Primary hypertension 11/27/2013   Ulcer    Urinary incontinence     Past Surgical History:  Procedure Laterality Date   ABDOMINAL HYSTERECTOMY  09/11/2008   TVH/BSO--Dr. Nikki with TVT   BILATERAL SALPINGOOPHORECTOMY     2009   BLADDER SUSPENSION  09/11/2008   Dr. Nikki   BREAST BIOPSY Right 07/09/2024   US  RT BREAST BX W LOC DEV 1ST LESION IMG BX SPEC US  GUIDE 07/09/2024 [facility]MO[facility]   CERVIX LESION DESTRUCTION  11/11/1978   hx abnormal pap--dysplasia   COLONOSCOPY WITH PROPOFOL  Left 10/25/2017   Procedure: COLONOSCOPY WITH PROPOFOL ;  Surgeon: Saintclair Jasper, MD;  Location: [facility];  Service: Gastroenterology;  Laterality: Left;    Social History   Socioeconomic History   Marital status: Single    Spouse name: Not on file   Number of children: Not on file   Years of education: college   Highest education level: Not on file  Occupational History   Not on file  Tobacco Use   Smoking status: Never   Smokeless tobacco: Never  Vaping Use   Vaping status: Never Used  Substance and Sexual Activity   Alcohol use: Yes    Comment: rare, social   Drug use: No   Sexual activity: Not Currently    Birth control/protection: Surgical    Comment: TAH/BSO, more than 5, after 18, no STD, no abnormal  pap, no DES  Other Topics Concern   Not on file  Social History Narrative   Not on file   Social Drivers of Health   Financial Resource Strain: Not on file  Food Insecurity: No Food Insecurity (07/21/2024)   Hunger Vital Sign    Worried About Running Out of Food in the Last Year: Never true    Ran Out of Food in the Last Year: Never true  Transportation Needs: No Transportation Needs (07/21/2024)   PRAPARE - Administrator, Civil Service (Medical): No    Lack of Transportation (Non-Medical): No  Physical Activity: Inactive (11/27/2023)   Exercise Vital Sign    Days of Exercise per Week: 0 days    Minutes of Exercise per Session: 0  min  Stress: Not on file  Social Connections: Not on file     FAMILY HISTORY:  We obtained a detailed, 4-generation family history.  Significant diagnoses are listed below: Family History  Problem Relation Age of Onset   Diabetes Mother    Hypertension Mother    Thyroid  disease Sister    Lung cancer Brother    Seizures Brother    Diabetes Brother    Hypertension Brother    Diabetes Brother    Breast cancer Maternal Cousin    Arthritis Other    Diabetes Other      UPDATE:  The clinic note has been updated to reflect additional family history the patient provided.  The patient has two daughters and a son who are cancer free.  She has 10 siblings, one brother has lung cancer.  Both parents are deceased.  The patient's mother has 11 siblings, one brother has a daughter with breast cancer and one sister was diagnosed with breast cancer.  There is no other reported family history of cancer.  Ms. Wren is unaware of previous family history of genetic testing for hereditary cancer risks. There is no reported Ashkenazi Jewish ancestry. There is no known consanguinity.  GENETIC COUNSELING ASSESSMENT: Ms. Defrancesco is a 73 y.o. female with a personal and family history of cancer which is somewhat suggestive of a predisposition to cancer given the two family members with breast cancer. We, therefore, discussed and recommended the following at today's visit.   DISCUSSION: We discussed that, in general, most cancer is not inherited in families, but instead is sporadic or familial. Sporadic cancers occur by chance and typically happen at older ages (>50 years) as this type of cancer is caused by genetic changes acquired during an individual's lifetime. Some families have more cancers than would be expected by chance; however, the ages or types of cancer are not consistent with a known genetic mutation or known genetic mutations have been ruled out. This type of familial cancer is  thought to be due to a combination of multiple genetic, environmental, hormonal, and lifestyle factors. While this combination of factors likely increases the risk of cancer, the exact source of this risk is not currently identifiable or testable.  We discussed that 5 - 10% of breast cancer is hereditary, with most cases associated with BRCA mutations.  There are other genes that can be associated with hereditary breast cancer syndromes.  These include ATM, CHEK2 and PALB2.  We discussed that testing is beneficial for several reasons including knowing how to follow individuals after completing their treatment,and understanding if other family members could be at risk for cancer and allow them to undergo genetic testing.   We reviewed the characteristics, features  and inheritance patterns of hereditary cancer syndromes. We also discussed genetic testing, including the appropriate family members to test, the process of testing, insurance coverage and turn-around-time for results. We discussed the implications of a negative, positive, carrier and/or variant of uncertain significant result. Ms. Dittrich  was offered a common hereditary cancer panel (36+ genes) and an expanded pan-cancer panel (70+ genes). Ms. Gilmartin was informed of the benefits and limitations of each panel, including that expanded pan-cancer panels contain genes that do not have clear management guidelines at this point in time.  We also discussed that as the number of genes included on a panel increases, the chances of variants of uncertain significance increases. Ms. Symonds decided to pursue genetic testing for the CancerNext-Expanded+RNAinsight gene panel.   The CancerNext-Expanded gene panel offered by [facility] and includes sequencing, rearrangement, and RNA analysis for the following 77 genes: AIP, ALK, APC, ATM, BAP1, BARD1, BMPR1A, BRCA1, BRCA2, BRIP1, CDC73, CDH1, CDK4, CDKN1B, CDKN2A, CEBPA, CHEK2, CTNNA1,  DDX41, DICER1, ETV6, FH, FLCN, GATA2, LZTR1, MAX, MBD4, MEN1, MET, MLH1, MSH2, MSH3, MSH6, MUTYH, NF1, NF2, NTHL1, PALB2, PHOX2B, PMS2, POT1, PRKAR1A, PTCH1, PTEN, RAD51C, RAD51D, RB1, RET, RPS20, RUNX1, SDHA, SDHAF2, SDHB, SDHC, SDHD, SMAD4, SMARCA4, SMARCB1, SMARCE1, STK11, SUFU, TMEM127, TP53, TSC1, TSC2, VHL, and WT1 (sequencing and deletion/duplication); AXIN2, CTNNA1, DDX41, EGFR, HOXB13, KIT, MBD4, MITF, MSH3, PDGFRA, POLD1 and POLE (sequencing only); EPCAM and GREM1 (deletion/duplication only). RNA data is routinely analyzed for use in variant interpretation for all genes.   Based on [name]'s personal and family history of cancer, she meets medical criteria for genetic testing. Despite that she meets criteria, she may still have an out of pocket cost. We discussed that if her out of pocket cost for testing is over $100, the laboratory will call and confirm whether she wants to proceed with testing.  If the out of pocket cost of testing is less than $100 she will be billed by the genetic testing laboratory.   PLAN: After considering the risks, benefits, and limitations, [name] provided informed consent to pursue genetic testing and the blood sample was sent to [facility] for analysis of the CancerNext-Expanded+RNAinsight. Results should be available within approximately 2-3 weeks' time, at which point they will be disclosed by telephone to [name], as will any additional recommendations warranted by these results. Ms. Laskowski will receive a summary of her genetic counseling visit and a copy of her results once available. This information will also be available in Epic.   Lastly, we encouraged [name] to remain in contact with cancer genetics annually so that we can continuously update the family history and inform her of any changes in cancer genetics and testing that may be of benefit for this family.   Ms. Brant  questions were answered to her satisfaction today. Our contact information was provided should additional questions or concerns arise. Thank you for the referral and allowing us  to share in the care of your patient.   Asherah Lavoy P. Perri, MS, CGC Licensed, Patent attorney [email or web address] phone: [phone]  In total, 45 minutes were spent on the date of the encounter in service to the patient including preparation, face-to-face consultation, documentation and care coordination.  The patient brought her daughter. Drs. Lanny Stalls, and/or Gudena were available for questions, if needed..    _______________________________________________________________________ For Office Staff:  Number of people involved in session: 2 Was an Intern/ student involved with case: no

## 2024-07-27 ENCOUNTER — Telehealth: Payer: Self-pay | Admitting: Genetic Counselor

## 2024-07-27 NOTE — Telephone Encounter (Signed)
 Patient reports receiving a bill from [facility] for $212.  She is concerned that [employer] was billed rather than Medicare.  Lastly, she identified an additional relative with breast cancer.  I contacted [facility] asking about the cost of testing, asking to confirm that Medicare was billed, and letting them know that I was submitting an amended clinic note due to the additional family member.  I encouraged [name] to call [facility] and apply for patient assistance.  She reported that she will do that.  Margaret Monte, MS, CGC  Licensed, Patent attorney [email or web address] phone: [phone]

## 2024-07-28 ENCOUNTER — Ambulatory Visit (INDEPENDENT_AMBULATORY_CARE_PROVIDER_SITE_OTHER): Admitting: Nurse Practitioner

## 2024-07-28 ENCOUNTER — Encounter: Payer: Self-pay | Admitting: Nurse Practitioner

## 2024-07-28 VITALS — BP 114/70 | HR 73 | Temp 98.5°F

## 2024-07-28 DIAGNOSIS — R1032 Left lower quadrant pain: Secondary | ICD-10-CM | POA: Diagnosis not present

## 2024-07-28 DIAGNOSIS — R8290 Unspecified abnormal findings in urine: Secondary | ICD-10-CM | POA: Diagnosis not present

## 2024-07-28 NOTE — Progress Notes (Signed)
   Acute Office Visit  Subjective:    Patient ID: Margaret [name], female    DOB: Apr 13, 1951, 73 y.o.   MRN: [id number]   HPI 73 y.o. presents today for malodorous urine, lower back pain, fatigue x 2-3 weeks. Pain is intermittent, in lower left back that radiates to front lower abdomen. Seeing GI for upper GI symptoms. Was planning endoscopy but postponed due to recent breast cancer diagnosis and follow ups for this. No known injury. Zanaflex provided elsewhere yesterday, took one dose last night. H/O diverticulosis. Denies blood in stool, diarrhea, constipation. No vaginal symptoms, dysuria, frequency, hematuria or urgency.  No LMP recorded. Patient has had a hysterectomy.    Review of Systems  Constitutional:  Positive for fatigue. Negative for chills and fever.  Gastrointestinal:  Positive for abdominal pain (LLQ). Negative for blood in stool, constipation, diarrhea and nausea.  Genitourinary:  Negative for difficulty urinating, dysuria, flank pain, frequency, hematuria, urgency, vaginal bleeding, vaginal discharge and vaginal pain.  Musculoskeletal:  Positive for back pain.       Objective:    Physical Exam Constitutional:      Appearance: Normal appearance.  Abdominal:     Palpations: Abdomen is soft.     Tenderness: There is abdominal tenderness in the left lower quadrant. There is no right CVA tenderness, left CVA tenderness, guarding or rebound.     BP 114/70   Pulse 73   Temp 98.5 F (36.9 C) (Oral)   SpO2 98%  Wt Readings from Last 3 Encounters:  07/21/24 175 lb 8 oz (79.6 kg)  06/22/24 177 lb 9.6 oz (80.6 kg)  06/21/24 174 lb (78.9 kg)        UA negative  Assessment & Plan:   Problem List Items Addressed This Visit   None Visit Diagnoses       Left lower quadrant abdominal pain    -  Primary     Abnormal urine odor       Relevant Orders   Urinalysis,Complete w/RFL Culture      Plan: Appears to be GI in nature. Recommend bland diet for 3-4  days. See GI if pain continues after a few days. ER precautions reviewed. Tylenol  as needed, avoid Ibuprofen . UA negative.   Return if symptoms worsen or fail to improve.    [name] Shutter DNP, 10:23 AM 07/28/2024

## 2024-07-29 ENCOUNTER — Encounter: Payer: Self-pay | Admitting: *Deleted

## 2024-07-29 ENCOUNTER — Telehealth: Payer: Self-pay | Admitting: *Deleted

## 2024-07-29 DIAGNOSIS — C50311 Malignant neoplasm of lower-inner quadrant of right female breast: Secondary | ICD-10-CM
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
## 2024-07-30 ENCOUNTER — Ambulatory Visit
Admission: RE | Admit: 2024-07-30 | Discharge: 2024-07-30 | Disposition: A | Discharge status: Home / Self Care | Source: Ambulatory Visit | Attending: Obstetrics and Gynecology | Admitting: Obstetrics and Gynecology

## 2024-07-30 ENCOUNTER — Ambulatory Visit: Payer: Self-pay | Admitting: Nurse Practitioner

## 2024-07-30 DIAGNOSIS — N644 Mastodynia: Secondary | ICD-10-CM

## 2024-07-30 LAB — URINALYSIS, COMPLETE W/RFL CULTURE
Bacteria, UA: NONE SEEN /HPF
Bilirubin Urine: NEGATIVE
Glucose, UA: NEGATIVE
Hyaline Cast: NONE SEEN /LPF
Ketones, ur: NEGATIVE
Leukocyte Esterase: NEGATIVE
Nitrites, Initial: NEGATIVE
Protein, ur: NEGATIVE
Specific Gravity, Urine: 1.015 (ref 1.001–1.035)
pH: 7 (ref 5.0–8.0)

## 2024-07-30 LAB — URINE CULTURE
MICRO NUMBER:: 16979907
Result:: NO GROWTH
SPECIMEN QUALITY:: ADEQUATE

## 2024-07-30 LAB — CULTURE INDICATED

## 2024-08-02 ENCOUNTER — Telehealth: Payer: Self-pay

## 2024-08-02 NOTE — Telephone Encounter (Signed)
 [employer] 2021-05 - Specimen Collection Study to Evaluate Biomarkers in Subjects with Cancer    Followed up with patient on study interest. Patient needs time to decide if she is interested in participating. Patient request to contact research once ready with a decision. Patient has contact information if she has any questions. Will continue to follow on study interest.  Laury Quale, MPH  Clinical Research Coordinator

## 2024-08-03 ENCOUNTER — Telehealth: Payer: Self-pay | Admitting: Genetic Counselor

## 2024-08-03 ENCOUNTER — Encounter: Payer: Self-pay | Admitting: Genetic Counselor

## 2024-08-03 DIAGNOSIS — Z1379 Encounter for other screening for genetic and chromosomal anomalies: Secondary | ICD-10-CM | POA: Insufficient documentation

## 2024-08-03 NOTE — Telephone Encounter (Signed)
 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  Discussed that this increases the risk for breast cancer in both men and women, ovarian cancer, pancreatic cancer and prostate cancer.  Her children and siblings are at 50% risk for having this familial mutation.  Explained that due to the breast cancer risk, we could manage her care either by high risk breast cancer screening or she could discuss with her surgeon the option of a double mastectomy.  I will upload a copy of her report to her chart and send her a packet of information in the mail.  I will do this by the end of the week.  The patient voiced her understanding.  Darice Monte, MS, CGC  Licensed, Patent attorney [email or web address] phone: [phone]

## 2024-08-04 ENCOUNTER — Encounter: Payer: Self-pay | Admitting: General Practice

## 2024-08-04 NOTE — Progress Notes (Signed)
 [facility] Spiritual Care Note  Left [facility] follow-up voicemail with direct number, encouraging return call.  [address] [place], [place], [facility] Pager [phone] Voicemail [phone]

## 2024-08-09 ENCOUNTER — Ambulatory Visit: Payer: Self-pay | Admitting: Genetic Counselor

## 2024-08-09 ENCOUNTER — Other Ambulatory Visit: Payer: Self-pay | Admitting: General Surgery

## 2024-08-09 ENCOUNTER — Encounter: Payer: Self-pay | Admitting: Genetic Counselor

## 2024-08-09 DIAGNOSIS — Z1379 Encounter for other screening for genetic and chromosomal anomalies: Secondary | ICD-10-CM

## 2024-08-09 DIAGNOSIS — C50311 Malignant neoplasm of lower-inner quadrant of right female breast: Secondary | ICD-10-CM

## 2024-08-09 DIAGNOSIS — Z803 Family history of malignant neoplasm of breast: Secondary | ICD-10-CM

## 2024-08-09 NOTE — Progress Notes (Signed)
 GENETIC TEST RESULTS   Patient Name: Margaret [name] Patient Age: 73 y.o. Encounter Date: 08/09/2024  Referring Provider: Mackey Chad, MD    [name] was seen in the [employer] clinic due to a personal and family history of cancer and concern regarding a hereditary predisposition to cancer in the family. Please refer to the prior Genetics clinic note for more information regarding [name]'s medical and family histories and our assessment at the time.   FAMILY HISTORY:  We obtained a detailed, 4-generation family history.  Significant diagnoses are listed below: Family History  Problem Relation Age of Onset   Diabetes Mother    Hypertension Mother    Thyroid  disease Sister    Lung cancer Brother    Seizures Brother    Diabetes Brother    Hypertension Brother    Diabetes Brother    Breast cancer Maternal Cousin    Arthritis Other    Diabetes Other     UPDATE:  The clinic note has been updated to reflect additional family history the patient provided.   The patient has two daughters and a son who are cancer free.  She has 10 siblings, one brother has lung cancer.  Both parents are deceased.   The patient's mother has 11 siblings, one brother has a daughter with breast cancer, a second brother has two daughters with breast cancer, and one sister was diagnosed with breast cancer.   There is no other reported family history of cancer.   Ms. Scobey is unaware of previous family history of genetic testing for hereditary cancer risks. There is no reported Ashkenazi Jewish ancestry. There is no known consanguinity  GENETIC TESTING: At the time of [name]'s visit, we recommended she pursue genetic testing of the CancerNext-Expanded+RNAinsight panel. 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   Genetic testing did detect a Variant of Unknown Significance in the LZTR1 gene called [id number].  At this time, it is unknown if this variant is associated with increased cancer risk or if this is a normal finding, but most variants such as this get reclassified to being inconsequential. It should not be used to make medical management decisions. With time, we suspect the lab will determine the significance of this variant, if any. If we do learn more about it, we will try to contact [name] to discuss it further. However, it is important to stay in touch with us  periodically and keep the address and phone number up to date.    Clinical Information: Hereditary breast, ovarian, and pancreatic cancer risk due to pathogenic variants in PALB2 is characterized by an increased lifetime risk for, generally, adult-onset cancers including, breast, contralateral breast, female breast, ovarian, and pancreatic.  The cancers associated with PALB2 are:  Female breast cancer, up to an 53% risk There is a higher incidence of triple negative breast cancer diagnosed in women with PALB2 mutations In women with a history of breast cancer, the cumulative risk for contralateral breast cancer 5 years after breast cancer diagnosis is 5-8%. Female breast cancer, up to a 0.9% risk Ovarian cancer, up to a 3-5% risk Pancreatic cancer, 3-5% risk  Management Recommendations:  Breast Screening/Risk Reduction:  Women: Breast cancer screening includes: Breast awareness beginning at age 63 Monthly self-breast examination beginning at age 28 Clinical breast examination every 6-12 months beginning at age 80 or at the age  of the earliest diagnosed breast cancer in the family, if onset was before age 65 Annual breast MRI with contrast beginning at age 48 (or annual mammograms with consideration of tomosynthesis if MRI is unavailable), although the age to initiate screening may be  individualized based on family history Annual mammogram beginning at age 70 until age 79 with consideration of tomosynthesis with continuation of annual breast MRI with contrast The option of prophylactic bilateral risk-reducing mastectomy (RRM), removal of the breast tissue before cancer develops, is the best option for significantly decreasing the risk of developing breast cancer. Studies have shown mastectomies reduce the risk of breast cancer by 90-95% in women with a BRCA1 mutation - and may also be seen in the PALB2 population. Breast reconstruction can also be performed immediately following the mastectomy, depending on the type of reconstruction chosen. For women with a PALB2 pathogenic or likely pathogenic variant who are treated for breast cancer and have not had a bilateral mastectomy, screening with annual mammogram with consideration of tomosynthesis and breast MRI should continue as described above.  Males: Breast self-exam training and education starting at age 56 years Annual clinical breast exam starting at age 49 years  Consider annual mammogram starting at age 49 or 10 years before the earliest known female breast cancer in the family (whichever comes first).   Gynecological Cancer Screening/Risk Reduction: It is recommended that women with a PALB2 mutation consider having a risk-reducing salpingo oophorectomy (RRSO), removal of the ovaries and fallopian tubes, between the ages of 70-50 or once childbearing is completed. Having a RRSO is estimated to reduce the risk of ovarian cancer by up to 96%. There is still a small risk of developing an ovarian-like cancer in the lining of the abdomen, called the peritoneum. Another benefit to having the ovaries removed is the risk reduction for breast cancer. If the ovaries are removed before menopause, the risk of developing breast cancer is reduced. Women undergoing a RRSO should be aware of the potential risks and benefits of concurrent  hysterectomy. Hormone replacement therapy could be considered based on the physician's discretion. Individuals at risk for developing breast and ovarian cancer may benefit from the use of medication to reduce their risk for cancer. These medications are referred to as chemoprevention. For example, oral contraceptive use has been shown to reduce the risk of ovarian cancer by approximately 60% in other gene carrier called BRCA1 if taken for at least 5 years. This risk reduction remains even after discontinuation of oral contraceptives. Ovarian cancer screening is an option for women who chose not to have a RRSO or who, as of yet, have not completed their family. Current screening methods for ovarian cancer are neither sensitive nor specific, meaning that often early stage ovarian cancer cannot be diagnosed through this screening.  Screening can also be falsely positive with no cancer present. For this reason, RRSO is recommended over screening. If ovarian cancer screening is recommended by your physician, it could include: CA-125 blood tests Transvaginal ultrasounds Clinical pelvic exams   Pancreatic Cancer Screening/Risk Reduction: Avoid smoking, heavy alcohol use, and obesity. It has been suggested that pancreatic cancer screening be limited to those with a family history of pancreatic cancer (first- or second-degree relative). Ideally, screening should be performed in experienced centers utilizing a multidisciplinary approach under research conditions. Recommended screening could include annual endoscopic ultrasound (preferred) and/or MRI of the pancreas starting at age 65 or 52 years younger than the earliest age diagnosis in the family. CA19-9 testing  may be considered based on the physician's discretion.  Additional Considerations: Recent studies have suggested PARP inhibitors may be a beneficial chemotherapeutic agent for a subset of patients with PALB2-associated breast, ovarian, prostate, and  pancreatic cancers. Clinical trials are currently in process to determine if and how these agents can be useful in the treatment of PALB2 cancer patients. Patients of reproductive age should be made aware of options for prenatal diagnosis and assisted reproduction including pre-implantation genetic diagnosis. Individuals with a single pathogenic PALB2 variant are carriers of Fanconi anemia. Fanconi anemia is characterized by developmental delay apparent from infancy, short stature, microcephaly, and coarse dysmorphic features. For there to be a risk of Fanconi anemia in offspring, both the patient and their partner would each have to carry a pathogenic variant in PALB2. In this case, the risk of having an affected child is 25%.    This information is based on current understanding of the gene and may change in the future.   Implications for Family Members: Hereditary predisposition to cancer due to pathogenic variants in the PALB2 gene has autosomal dominant inheritance. This means that an individual with a pathogenic variant has a 50% chance of passing the condition on to his/her offspring. Identification of a pathogenic variant allows for the recognition of at-risk relatives who can pursue testing for the familial variant.  Family members are encouraged to consider genetic testing for this familial pathogenic variant. As there are generally no childhood cancer risks associated with pathogenic variants in the PALB2 gene, individuals in the family are not recommended to have testing until they reach at least 71 years of age. They may contact our office at [phone] for more information or to schedule an appointment.  Complimentary testing for the familial variant is available for 90 days.  Family members who live outside of the area are encouraged to find a genetic counselor in their area by visiting: [email or web address].  Resources: FORCE (Facing Our Risk of Cancer Empowered)  is a resource for those with a hereditary predisposition to develop cancer.  FORCE provides information about risk reduction, advocacy, legislation, and clinical trials.  Additionally, FORCE provides a platform for collaboration and support; which includes: peer navigation, message boards, local support groups, a toll-free helpline, research registry and recruitment, advocate training, published medical research, webinars, brochures, mastectomy photos, and more.  For more information, visit www.facingourrisk.org  We encouraged [name] to remain in contact with us  on an annual basis so we can update her personal and family histories, and let her know of advances in cancer genetics that may benefit the family. Our contact number was provided. Ms. Silba questions were answered to her satisfaction today, and she knows she is welcome to call anytime with additional questions.   Paulanthony Gleaves P. Perri, MS, [facility] Licensed, Patent attorney [email or web address] phone: [phone]

## 2024-08-09 NOTE — Progress Notes (Signed)
 Surgical Instructions   Your procedure is scheduled on Thursday, October 2nd, 2025. Report to [facility] Main Entrance A at 6:30 A.M., then check in with the Admitting office. Any questions or running late day of surgery: call [phone]  Questions prior to your surgery date: call [phone], Monday-Friday, 8am-4pm. If you experience any cold or flu symptoms such as cough, fever, chills, shortness of breath, etc. between now and your scheduled surgery, please notify us  at the above number.     Remember:  Do not eat after midnight the night before your surgery   You may drink clear liquids until 5:30 the morning of your surgery.   Clear liquids allowed are: Water, Non-Citrus Juices (without pulp), Carbonated Beverages, Clear Tea (no milk, honey, etc.), Black Coffee Only (NO MILK, CREAM OR POWDERED CREAMER of any kind), and Gatorade.    Take these medicines the morning of surgery with A SIP OF WATER: Omeprazole  (Prilosec) Povidone (Ivizia Dry Eyes) Eye Drop    May take these medicines IF NEEDED: Acetaminophen  (Tylenol ) Azelastine (Astelin) Nasal Spray Fluticasone (Flonase) Nitroglycerin  - please call [phone] after taking this medication Systane Eye Drop    One week prior to surgery, STOP taking any Aspirin  (unless otherwise instructed by your surgeon) Aleve , Naproxen , Ibuprofen , Motrin , Advil , Goody's, BC's, all herbal medications, fish oil, and non-prescription vitamins.  This includes Diclofenac  Sodium (Voltaren ) gel.                      Do NOT Smoke (Tobacco/Vaping) for 24 hours prior to your procedure.  If you use a CPAP at night, you may bring your mask/headgear for your overnight stay.   You will be asked to remove any contacts, glasses, piercing's, hearing aid's, dentures/partials prior to surgery. Please bring cases for these items if needed.    Patients discharged the day of surgery will not be allowed to drive home, and someone needs to stay with them  for 24 hours.  SURGICAL WAITING ROOM VISITATION Patients may have no more than 2 support people in the waiting area - these visitors may rotate.   Pre-op nurse will coordinate an appropriate time for 1 ADULT support person, who may not rotate, to accompany patient in pre-op.  Children under the age of 40 must have an adult with them who is not the patient and must remain in the main waiting area with an adult.  If the patient needs to stay at the hospital during part of their recovery, the visitor guidelines for inpatient rooms apply.  Please refer to the [facility] website for the visitor guidelines for any additional information.   If you received a COVID test during your pre-op visit  it is requested that you wear a mask when out in public, stay away from anyone that may not be feeling well and notify your surgeon if you develop symptoms. If you have been in contact with anyone that has tested positive in the last 10 days please notify you surgeon.      Pre-operative CHG Bathing Instructions   You can play a key role in reducing the risk of infection after surgery. Your skin needs to be as free of germs as possible. You can reduce the number of germs on your skin by washing with CHG (chlorhexidine gluconate) soap before surgery. CHG is an antiseptic soap that kills germs and continues to kill germs even after washing.   DO NOT use if you have an allergy to chlorhexidine/CHG or antibacterial  soaps. If your skin becomes reddened or irritated, stop using the CHG and notify one of our RNs at [phone].              TAKE A SHOWER THE NIGHT BEFORE SURGERY AND THE DAY OF SURGERY    Please keep in mind the following:  DO NOT shave, including legs and underarms, 48 hours prior to surgery.   You may shave your face before/day of surgery.  Place clean sheets on your bed the night before surgery Use a clean washcloth (not used since being washed) for each shower. DO NOT sleep with pet's  night before surgery.  CHG Shower Instructions:  Wash your face and private area with normal soap. If you choose to wash your hair, wash first with your normal shampoo.  After you use shampoo/soap, rinse your hair and body thoroughly to remove shampoo/soap residue.  Turn the water OFF and apply half the bottle of CHG soap to a CLEAN washcloth.  Apply CHG soap ONLY FROM YOUR NECK DOWN TO YOUR TOES (washing for 3-5 minutes)  DO NOT use CHG soap on face, private areas, open wounds, or sores.  Pay special attention to the area where your surgery is being performed.  If you are having back surgery, having someone wash your back for you may be helpful. Wait 2 minutes after CHG soap is applied, then you may rinse off the CHG soap.  Pat dry with a clean towel  Put on clean pajamas    Additional instructions for the day of surgery: DO NOT APPLY any lotions, deodorants, cologne, or perfumes.   Do not wear jewelry or makeup Do not wear nail polish, gel polish, artificial nails, or any other type of covering on natural nails (fingers and toes) Do not bring valuables to the hospital. [facility] is not responsible for valuables/personal belongings. Put on clean/comfortable clothes.  Please brush your teeth.  Ask your nurse before applying any prescription medications to the skin.

## 2024-08-10 ENCOUNTER — Telehealth: Payer: Self-pay

## 2024-08-10 ENCOUNTER — Encounter (HOSPITAL_COMMUNITY): Payer: Self-pay

## 2024-08-10 ENCOUNTER — Other Ambulatory Visit: Payer: Self-pay

## 2024-08-10 ENCOUNTER — Encounter

## 2024-08-10 ENCOUNTER — Ambulatory Visit
Admission: RE | Admit: 2024-08-10 | Discharge: 2024-08-10 | Disposition: A | Discharge status: Home / Self Care | Source: Ambulatory Visit | Attending: General Surgery | Admitting: General Surgery

## 2024-08-10 ENCOUNTER — Ambulatory Visit
Admission: RE | Admit: 2024-08-10 | Discharge: 2024-08-10 | Disposition: A | Source: Ambulatory Visit | Attending: General Surgery | Admitting: General Surgery

## 2024-08-10 ENCOUNTER — Encounter (HOSPITAL_COMMUNITY)
Admission: RE | Admit: 2024-08-10 | Discharge: 2024-08-10 | Disposition: A | Discharge status: Home / Self Care | Source: Ambulatory Visit | Attending: General Surgery | Admitting: General Surgery

## 2024-08-10 DIAGNOSIS — I10 Essential (primary) hypertension: Secondary | ICD-10-CM | POA: Insufficient documentation

## 2024-08-10 DIAGNOSIS — I2510 Atherosclerotic heart disease of native coronary artery without angina pectoris: Secondary | ICD-10-CM | POA: Insufficient documentation

## 2024-08-10 DIAGNOSIS — E785 Hyperlipidemia, unspecified: Secondary | ICD-10-CM | POA: Insufficient documentation

## 2024-08-10 DIAGNOSIS — C50311 Malignant neoplasm of lower-inner quadrant of right female breast: Secondary | ICD-10-CM

## 2024-08-10 DIAGNOSIS — K219 Gastro-esophageal reflux disease without esophagitis: Secondary | ICD-10-CM | POA: Diagnosis not present

## 2024-08-10 DIAGNOSIS — C50911 Malignant neoplasm of unspecified site of right female breast: Secondary | ICD-10-CM | POA: Diagnosis present

## 2024-08-10 HISTORY — DX: Unspecified osteoarthritis, unspecified site: M19.90

## 2024-08-10 HISTORY — PX: BREAST BIOPSY: SHX20

## 2024-08-10 NOTE — Telephone Encounter (Signed)
 [employer] 2021-05 - Specimen Collection Study to Evaluate Biomarkers in Subjects with Cancer    Followed up with patient via phone on study interest. Left voicemail on answering machine. Patient has contact information if she has any questions. Will continue to follow patient on study interest.  Laury Quale, MPH  Clinical Research Coordinator

## 2024-08-10 NOTE — Progress Notes (Addendum)
 Anesthesia Chart Review:  Case: [id number] Date/Time: 08/12/24 0824   Procedure: BREAST LUMPECTOMY WITH RADIOACTIVE SEED AND SENTINEL LYMPH NODE BIOPSY (Right: Breast) - GEN w/PEC BLOCK RIGHT BREAST RADIOACTIVE SEED LUMPECTOMY SENTINEL NODE BIOPSY   Anesthesia type: General   Pre-op diagnosis: RIGHT BREAST CANCER   [facility] ROOM 05 / [facility] OR   Surgeons: Curvin Deward MOULD, MD       DISCUSSION: Patient is a 73 year old female scheduled for the above procedure.  History includes never smoker, HNT, HLD, CAD (CAC 0, mild 25-49% non-obstructive LAD 10/18/2022 CCTA), GERD, right breast cancer (07/09/2024), hysterectomy (09/11/2008).  Last cardiology visit was on 06/22/2024 with Emelia Hazy, NP. She had CCTA in 10/2022 for chest pain evaluation in 10/2022 which showed CAC 0, mild 25-49% non-obstructive LAD. She was re-evaluated in August after she was seen in the ED for chest pain for which she took a dose of nitroglycerin  at home. Work-up was reassuring. She denied exertional chest pain. Continue treatment for GERD. Continue rosuvastatin , ezetimibe , omega-3 fatty acids. No additional testing ordered. Six month follow-up with Dr. Raford planned.   RSL is scheduled for 08/10/2024 at 11:30 AM.  Anesthesia team to evaluate on the day of surgery.   VS: BP 131/75   Pulse 89   Temp 36.8 C   Resp 16   Ht 5' 6 (1.676 m)   Wt 79.2 kg   SpO2 99%   BMI 28.20 kg/m    PROVIDERS: [name], MD is PCP  Raford Riggs, MD is cardiologist  Fleeta Smock, Lamar, MD is allergist Elicia Claw, MD is GI   LABS: Most recent lab results in [facility] include: Lab Results  Component Value Date   WBC 5.0 07/21/2024   HGB 11.7 (L) 07/21/2024   HCT 37.7 07/21/2024   PLT 256 07/21/2024   GLUCOSE 97 07/21/2024   CHOL 127 05/13/2024   TRIG 62 05/13/2024   HDL 64 05/13/2024   LDLCALC 50 05/13/2024   ALT 23 07/21/2024   AST 25 07/21/2024   NA 141 07/21/2024   K 3.6 07/21/2024   CL 105 07/21/2024    CREATININE 0.71 07/21/2024   BUN 15 07/21/2024   CO2 31 07/21/2024   TSH 1.69 10/15/2023    IMAGES: CXR 06/17/2024: FINDINGS: The heart size and mediastinal contours are within normal limits. Both lungs are clear. The visualized skeletal structures are unremarkable. IMPRESSION: No active cardiopulmonary disease.  CT Head 05/17/2024: IMPRESSION: 1. No acute intracranial abnormality.  MRI C-spine 04/27/2023: IMPRESSION: 1. 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 Other than mild discogenic edema at C4-5, little change since 2021. 2. Mild foraminal and spinal canal narrowing at C4-5 and C5-6.   EKG: EKG 06/17/2024: SR at 71 bpm   CV: Coronary CTA 10/18/2022: FINDINGS: - Coronary Arteries:  Normal coronary origin.  Right dominance. - RCA is a large dominant artery that gives rise to PDA and PLA. There is no plaque. - Left main is a large artery that gives rise to LAD and LCX arteries. - LAD is a large vessel. Noncalcified plaque in the proximal LAD causes 25-49% stenosis - LCX is a non-dominant artery that gives rise to one large OM1 branch. There is no plaque. - Other findings: Left Ventricle: Normal size Left Atrium: Normal size Pulmonary Veins: Normal configuration Right Ventricle: Normal size Right Atrium: Normal size Cardiac valves: No calcifications Thoracic aorta: Normal size Pulmonary Arteries: Normal size Systemic Veins: Normal drainage Pericardium: Normal thickness   IMPRESSION: 1. Coronary calcium  score  of 0. 2. Normal coronary origin with right dominance. 3. Nonobstructive CAD, with noncalcified plaque causing mild (25-49%) stenosis in proximal LAD CAD-RADS 2. Mild non-obstructive CAD (25-49%). Consider non-atherosclerotic causes of chest pain. Consider preventive therapy and risk factor modification.    Past Medical History:  Diagnosis Date   Allergy    Anemia    Anxiety    Arthritis    CAD in native artery 11/27/2023   Cancer (HCC)    right breast  2025   Colitis    Dyspareunia    Endometriosis    Family history of breast cancer    GERD (gastroesophageal reflux disease)    High cholesterol    Hx of abnormal Pap smear 1980's   Hypertension    Osteopenia    Primary hypertension 11/27/2013   Ulcer    Urinary incontinence     Past Surgical History:  Procedure Laterality Date   ABDOMINAL HYSTERECTOMY  09/11/2008   TVH/BSO--Dr. Nikki with TVT   BILATERAL SALPINGOOPHORECTOMY     2009   BLADDER SUSPENSION  09/11/2008   Dr. Nikki   BREAST BIOPSY Right 07/09/2024   US  RT BREAST BX W LOC DEV 1ST LESION IMG BX SPEC US  GUIDE 07/09/2024 [facility] MAMMOGRAPHY   CERVIX LESION DESTRUCTION  11/11/1978   hx abnormal pap--dysplasia   COLONOSCOPY WITH PROPOFOL  Left 10/25/2017   Procedure: COLONOSCOPY WITH PROPOFOL ;  Surgeon: Saintclair Jasper, MD;  Location: [facility] ENDOSCOPY;  Service: Gastroenterology;  Laterality: Left;    MEDICATIONS:  acetaminophen  (TYLENOL ) 500 MG tablet   azelastine (ASTELIN) 0.1 % nasal spray   Calcium -Vitamin D -Vitamin K (CHEWABLE CALCIUM  PO)   ciclopirox  (PENLAC ) 8 % solution   diclofenac  Sodium (VOLTAREN ) 1 % GEL   ezetimibe  (ZETIA ) 10 MG tablet   Ferrous Sulfate (IRON PO)   fluticasone (FLONASE) 50 MCG/ACT nasal spray   HAWTHORN PO   hydrochlorothiazide  (HYDRODIURIL ) 12.5 MG tablet   ibuprofen  (ADVIL ) 200 MG tablet   nitroGLYCERIN  (NITROSTAT ) 0.4 MG SL tablet   Omega-3 Fatty Acids (EPA PO)   omeprazole  (PRILOSEC) 40 MG capsule   Polyethyl Glycol-Propyl Glycol (SYSTANE) 0.4-0.3 % SOLN   Povidone (IVIZIA DRY EYES OP)   rosuvastatin  (CRESTOR ) 5 MG tablet   VITAMIN D  PO   No current facility-administered medications for this encounter.    Isaiah Ruder, PA-C Surgical Short Stay/Anesthesiology [facility] Phone [phone] [facility] Phone [phone] 08/10/2024 9:43 AM

## 2024-08-10 NOTE — Anesthesia Preprocedure Evaluation (Signed)
 Anesthesia Evaluation  Patient identified by MRN, date of birth, ID band Patient awake    Reviewed: Allergy & Precautions, NPO status , Patient's Chart, lab work & pertinent test results, reviewed documented beta blocker date and time   History of Anesthesia Complications Negative for: history of anesthetic complications  Airway Mallampati: I       Dental  (+) Teeth Intact, Dental Advisory Given   Pulmonary    breath sounds clear to auscultation       Cardiovascular hypertension, + CAD   Rhythm:Regular Rate:Normal  Mild non-obstructive CAD (25-49%)   Neuro/Psych   Anxiety        GI/Hepatic ,GERD  ,,  Endo/Other    Renal/GU      Musculoskeletal  (+) Arthritis ,    Abdominal   Peds  Hematology  (+) Blood dyscrasia, anemia   Anesthesia Other Findings   Reproductive/Obstetrics                              Anesthesia Physical Anesthesia Plan  ASA: 3  Anesthesia Plan: General and Regional   Post-op Pain Management:    Induction: Intravenous  PONV Risk Score and Plan: 2 and TIVA  Airway Management Planned: Oral ETT  Additional Equipment: None  Intra-op Plan:   Post-operative Plan: Extubation in OR  Informed Consent:      Dental advisory given  Plan Discussed with: CRNA and Surgeon  Anesthesia Plan Comments: (PAT note written 08/10/2024 by Isaiah Ruder, PA-C.  )         Anesthesia Quick Evaluation

## 2024-08-10 NOTE — Progress Notes (Signed)
 PCP - [name], MD Cardiologist - Emelia Hazy, NP   PPM/ICD - denies Device Orders - n/a Rep Notified - n/a  Chest x-ray - denies EKG - 06/17/2024 Stress Test - in 2022 at [facility] per pt  ECHO - in 2022 at [facility] per pt  Cardiac Cath - denies  Sleep Study - denies CPAP - n/a  Fasting Blood Sugar - no DM Checks Blood Sugar _____ times a day  Last dose of GLP1 agonist-  n/a GLP1 instructions: n/a  Blood Thinner Instructions: n/a Aspirin  Instructions: n/a  ERAS Protcol - yes PRE-SURGERY Ensure or G2- no  COVID TEST- n/a   Anesthesia review: yes, seed placement; cardiac studies. No need to request Stress test and ECHO from [facility] per [place], [place]  Patient denies shortness of breath, fever, cough and chest pain at PAT appointment   All instructions explained to the patient, with a verbal understanding of the material. Patient agrees to go over the instructions while at home for a better understanding. Patient also instructed to self quarantine after being tested for COVID-19. The opportunity to ask questions was provided.

## 2024-08-12 ENCOUNTER — Ambulatory Visit (HOSPITAL_BASED_OUTPATIENT_CLINIC_OR_DEPARTMENT_OTHER): Payer: Self-pay

## 2024-08-12 ENCOUNTER — Ambulatory Visit (HOSPITAL_COMMUNITY)
Admission: RE | Admit: 2024-08-12 | Discharge: 2024-08-12 | Disposition: A | Discharge status: Home / Self Care | Attending: General Surgery | Admitting: General Surgery

## 2024-08-12 ENCOUNTER — Ambulatory Visit (HOSPITAL_COMMUNITY): Payer: Self-pay | Admitting: Physician Assistant

## 2024-08-12 ENCOUNTER — Ambulatory Visit
Admission: RE | Admit: 2024-08-12 | Discharge: 2024-08-12 | Disposition: A | Source: Ambulatory Visit | Attending: General Surgery | Admitting: General Surgery

## 2024-08-12 ENCOUNTER — Other Ambulatory Visit: Payer: Self-pay

## 2024-08-12 ENCOUNTER — Encounter (HOSPITAL_COMMUNITY): Payer: Self-pay | Admitting: General Surgery

## 2024-08-12 ENCOUNTER — Ambulatory Visit (HOSPITAL_COMMUNITY): Admission: RE | Admit: 2024-08-12 | Source: Ambulatory Visit

## 2024-08-12 ENCOUNTER — Encounter (HOSPITAL_COMMUNITY)
Admission: RE | Disposition: A | Discharge status: Home / Self Care | Payer: Self-pay | Source: Home / Self Care | Attending: General Surgery

## 2024-08-12 DIAGNOSIS — Z1722 Progesterone receptor negative status: Secondary | ICD-10-CM | POA: Diagnosis not present

## 2024-08-12 DIAGNOSIS — C50911 Malignant neoplasm of unspecified site of right female breast: Secondary | ICD-10-CM | POA: Diagnosis not present

## 2024-08-12 DIAGNOSIS — I10 Essential (primary) hypertension: Secondary | ICD-10-CM | POA: Diagnosis not present

## 2024-08-12 DIAGNOSIS — Z803 Family history of malignant neoplasm of breast: Secondary | ICD-10-CM | POA: Diagnosis not present

## 2024-08-12 DIAGNOSIS — Z170 Estrogen receptor positive status [ER+]: Secondary | ICD-10-CM | POA: Diagnosis not present

## 2024-08-12 DIAGNOSIS — C50311 Malignant neoplasm of lower-inner quadrant of right female breast: Secondary | ICD-10-CM | POA: Diagnosis present

## 2024-08-12 DIAGNOSIS — I2510 Atherosclerotic heart disease of native coronary artery without angina pectoris: Secondary | ICD-10-CM | POA: Insufficient documentation

## 2024-08-12 DIAGNOSIS — F419 Anxiety disorder, unspecified: Secondary | ICD-10-CM

## 2024-08-12 DIAGNOSIS — K219 Gastro-esophageal reflux disease without esophagitis: Secondary | ICD-10-CM | POA: Diagnosis not present

## 2024-08-12 DIAGNOSIS — N644 Mastodynia: Secondary | ICD-10-CM | POA: Diagnosis not present

## 2024-08-12 DIAGNOSIS — D649 Anemia, unspecified: Secondary | ICD-10-CM | POA: Diagnosis not present

## 2024-08-12 DIAGNOSIS — Z1732 Human epidermal growth factor receptor 2 negative status: Secondary | ICD-10-CM | POA: Insufficient documentation

## 2024-08-12 HISTORY — PX: BREAST LUMPECTOMY WITH RADIOACTIVE SEED AND SENTINEL LYMPH NODE BIOPSY: SHX6550

## 2024-08-12 SURGERY — BREAST LUMPECTOMY WITH RADIOACTIVE SEED AND SENTINEL LYMPH NODE BIOPSY
Anesthesia: General | Site: Breast | Laterality: Right

## 2024-08-12 MED ORDER — PROPOFOL 10 MG/ML IV BOLUS
INTRAVENOUS | Status: AC
  Filled 2024-08-12: qty 20

## 2024-08-12 MED ORDER — ONDANSETRON HCL 4 MG/2ML IJ SOLN
INTRAMUSCULAR | Status: AC
  Filled 2024-08-12: qty 2

## 2024-08-12 MED ORDER — FENTANYL CITRATE (PF) 100 MCG/2ML IJ SOLN: 25.0000 ug | INTRAMUSCULAR | Status: DC | PRN

## 2024-08-12 MED ORDER — ACETAMINOPHEN 10 MG/ML IV SOLN: 1000.0000 mg | Freq: Once | INTRAVENOUS | Status: DC | PRN

## 2024-08-12 MED ORDER — ONDANSETRON HCL 4 MG/2ML IJ SOLN: 4.0000 mg | Freq: Once | INTRAMUSCULAR | Status: DC | PRN

## 2024-08-12 MED ORDER — PHENYLEPHRINE HCL-NACL 20-0.9 MG/250ML-% IV SOLN
INTRAVENOUS | Status: DC | PRN
  Administered 2024-08-12: 25 ug/min via INTRAVENOUS

## 2024-08-12 MED ORDER — 0.9 % SODIUM CHLORIDE (POUR BTL) OPTIME
TOPICAL | Status: DC | PRN
  Administered 2024-08-12: 1000 mL

## 2024-08-12 MED ORDER — ORAL CARE MOUTH RINSE: 15.0000 mL | Freq: Once | OROMUCOSAL | Status: AC

## 2024-08-12 MED ORDER — VANCOMYCIN HCL IN DEXTROSE 1-5 GM/200ML-% IV SOLN
1000.0000 mg | INTRAVENOUS | Status: AC
Start: 2024-08-12 — End: 2024-08-12
  Administered 2024-08-12: 1000 mg via INTRAVENOUS
  Filled 2024-08-12: qty 200

## 2024-08-12 MED ORDER — OXYCODONE HCL 5 MG PO TABS: 5.0000 mg | ORAL_TABLET | Freq: Once | ORAL | Status: DC | PRN

## 2024-08-12 MED ORDER — DEXAMETHASONE SODIUM PHOSPHATE 10 MG/ML IJ SOLN
INTRAMUSCULAR | Status: AC
  Filled 2024-08-12: qty 1

## 2024-08-12 MED ORDER — DEXAMETHASONE SODIUM PHOSPHATE 10 MG/ML IJ SOLN
INTRAMUSCULAR | Status: DC | PRN
  Administered 2024-08-12: 10 mg via INTRAVENOUS

## 2024-08-12 MED ORDER — DEXMEDETOMIDINE HCL IN NACL 80 MCG/20ML IV SOLN
INTRAVENOUS | Status: AC
  Filled 2024-08-12: qty 20

## 2024-08-12 MED ORDER — PROPOFOL 10 MG/ML IV BOLUS
INTRAVENOUS | Status: DC | PRN
  Administered 2024-08-12: 30 mg via INTRAVENOUS
  Administered 2024-08-12: 80 mg via INTRAVENOUS
  Administered 2024-08-12: 20 mg via INTRAVENOUS
  Administered 2024-08-12: 40 mg via INTRAVENOUS
  Administered 2024-08-12: 30 mg via INTRAVENOUS
  Administered 2024-08-12: 40 mg via INTRAVENOUS

## 2024-08-12 MED ORDER — BUPIVACAINE-EPINEPHRINE (PF) 0.5% -1:200000 IJ SOLN
INTRAMUSCULAR | Status: DC | PRN
Start: 2024-08-12 — End: 2024-08-12
  Administered 2024-08-12: 30 mL

## 2024-08-12 MED ORDER — FENTANYL CITRATE (PF) 250 MCG/5ML IJ SOLN
INTRAMUSCULAR | Status: AC
  Filled 2024-08-12: qty 5

## 2024-08-12 MED ORDER — PROPOFOL 500 MG/50ML IV EMUL
INTRAVENOUS | Status: DC | PRN
  Administered 2024-08-12: 150 ug/kg/min via INTRAVENOUS
  Administered 2024-08-12: 175 ug/kg/min via INTRAVENOUS

## 2024-08-12 MED ORDER — LIDOCAINE 2% (20 MG/ML) 5 ML SYRINGE
INTRAMUSCULAR | Status: AC
  Filled 2024-08-12: qty 5

## 2024-08-12 MED ORDER — CHLORHEXIDINE GLUCONATE 0.12 % MT SOLN
15.0000 mL | Freq: Once | OROMUCOSAL | Status: AC
  Administered 2024-08-12: 15 mL via OROMUCOSAL
  Filled 2024-08-12: qty 15

## 2024-08-12 MED ORDER — DROPERIDOL 2.5 MG/ML IJ SOLN: 0.6250 mg | Freq: Once | INTRAMUSCULAR | Status: DC | PRN

## 2024-08-12 MED ORDER — CHLORHEXIDINE GLUCONATE CLOTH 2 % EX PADS
6.0000 | MEDICATED_PAD | Freq: Once | CUTANEOUS | Status: DC
Start: 2024-08-12 — End: 2024-08-12

## 2024-08-12 MED ORDER — PHENYLEPHRINE 80 MCG/ML (10ML) SYRINGE FOR IV PUSH (FOR BLOOD PRESSURE SUPPORT)
PREFILLED_SYRINGE | INTRAVENOUS | Status: AC
  Filled 2024-08-12: qty 10

## 2024-08-12 MED ORDER — LIDOCAINE 2% (20 MG/ML) 5 ML SYRINGE
INTRAMUSCULAR | Status: DC | PRN
  Administered 2024-08-12: 100 mg via INTRAVENOUS

## 2024-08-12 MED ORDER — MIDAZOLAM HCL 2 MG/2ML IJ SOLN
INTRAMUSCULAR | Status: AC
Start: 2024-08-12 — End: 2024-08-12
  Filled 2024-08-12: qty 2

## 2024-08-12 MED ORDER — PROPOFOL 1000 MG/100ML IV EMUL
INTRAVENOUS | Status: AC
  Filled 2024-08-12: qty 100

## 2024-08-12 MED ORDER — GABAPENTIN 100 MG PO CAPS
100.0000 mg | ORAL_CAPSULE | ORAL | Status: AC
Start: 2024-08-12 — End: 2024-08-12
  Administered 2024-08-12: 100 mg via ORAL
  Filled 2024-08-12: qty 1

## 2024-08-12 MED ORDER — ACETAMINOPHEN 500 MG PO TABS
1000.0000 mg | ORAL_TABLET | ORAL | Status: AC
  Administered 2024-08-12: 1000 mg via ORAL
  Filled 2024-08-12: qty 2

## 2024-08-12 MED ORDER — DEXMEDETOMIDINE HCL IN NACL 80 MCG/20ML IV SOLN
INTRAVENOUS | Status: DC | PRN
  Administered 2024-08-12: 8 ug via INTRAVENOUS
  Administered 2024-08-12: 4 ug via INTRAVENOUS
  Administered 2024-08-12: 8 ug via INTRAVENOUS

## 2024-08-12 MED ORDER — FENTANYL CITRATE (PF) 100 MCG/2ML IJ SOLN
INTRAMUSCULAR | Status: AC
  Administered 2024-08-12: 100 ug via INTRAVENOUS
  Filled 2024-08-12: qty 2

## 2024-08-12 MED ORDER — OXYCODONE HCL 5 MG/5ML PO SOLN: 5.0000 mg | Freq: Once | ORAL | Status: DC | PRN

## 2024-08-12 MED ORDER — FENTANYL CITRATE (PF) 100 MCG/2ML IJ SOLN: 100.0000 ug | Freq: Once | INTRAMUSCULAR | Status: AC

## 2024-08-12 MED ORDER — OXYCODONE HCL 5 MG PO TABS: 5.0000 mg | ORAL_TABLET | Freq: Four times a day (QID) | ORAL | 0 refills | Status: DC | PRN

## 2024-08-12 MED ORDER — ONDANSETRON HCL 4 MG/2ML IJ SOLN
INTRAMUSCULAR | Status: DC | PRN
  Administered 2024-08-12: 4 mg via INTRAVENOUS

## 2024-08-12 MED ORDER — MAGTRACE LYMPHATIC TRACER
INTRAMUSCULAR | Status: DC | PRN
  Administered 2024-08-12: 2 mL via INTRAMUSCULAR

## 2024-08-12 MED ORDER — MIDAZOLAM HCL 2 MG/2ML IJ SOLN
INTRAMUSCULAR | Status: DC | PRN
  Administered 2024-08-12 (×2): 1 mg via INTRAVENOUS

## 2024-08-12 MED ORDER — BUPIVACAINE-EPINEPHRINE 0.25% -1:200000 IJ SOLN
INTRAMUSCULAR | Status: DC | PRN
  Administered 2024-08-12: 22 mL

## 2024-08-12 MED ORDER — LACTATED RINGERS IV SOLN: INTRAVENOUS | Status: DC

## 2024-08-12 MED ORDER — BUPIVACAINE-EPINEPHRINE (PF) 0.25% -1:200000 IJ SOLN
INTRAMUSCULAR | Status: AC
  Filled 2024-08-12: qty 30

## 2024-08-12 SURGICAL SUPPLY — 33 items
BAG COUNTER SPONGE SURGICOUNT (BAG) IMPLANT
BINDER BREAST LRG (GAUZE/BANDAGES/DRESSINGS) IMPLANT
BINDER BREAST XLRG (GAUZE/BANDAGES/DRESSINGS) IMPLANT
CANISTER SUCTION 3000ML PPV (SUCTIONS) ×2 IMPLANT
CHLORAPREP W/TINT 26 (MISCELLANEOUS) ×2 IMPLANT
CLIP APPLIE 9.375 MED OPEN (MISCELLANEOUS) ×2 IMPLANT
CNTNR URN SCR LID CUP LEK RST (MISCELLANEOUS) ×2 IMPLANT
COVER PROBE W GEL 5X96 (DRAPES) ×4 IMPLANT
COVER SURGICAL LIGHT HANDLE (MISCELLANEOUS) ×2 IMPLANT
DERMABOND ADVANCED .7 DNX12 (GAUZE/BANDAGES/DRESSINGS) ×2 IMPLANT
DEVICE DUBIN SPECIMEN MAMMOGRA (MISCELLANEOUS) ×2 IMPLANT
DRAPE CHEST BREAST 15X10 FENES (DRAPES) ×2 IMPLANT
ELECT COATED BLADE 2.86 ST (ELECTRODE) ×2 IMPLANT
ELECTRODE REM PT RTRN 9FT ADLT (ELECTROSURGICAL) ×2 IMPLANT
GLOVE BIO SURGEON STRL SZ7.5 (GLOVE) ×4 IMPLANT
GOWN STRL REUS W/ TWL LRG LVL3 (GOWN DISPOSABLE) ×4 IMPLANT
KIT BASIN OR (CUSTOM PROCEDURE TRAY) ×2 IMPLANT
KIT MARKER MARGIN INK (KITS) ×2 IMPLANT
LIGHT WAVEGUIDE WIDE FLAT (MISCELLANEOUS) IMPLANT
NDL 18GX1X1/2 (RX/OR ONLY) (NEEDLE) IMPLANT
NDL FILTER BLUNT 18X1 1/2 (NEEDLE) IMPLANT
NDL HYPO 25GX1X1/2 BEV (NEEDLE) ×2 IMPLANT
NEEDLE 18GX1X1/2 (RX/OR ONLY) (NEEDLE) ×1 IMPLANT
NEEDLE FILTER BLUNT 18X1 1/2 (NEEDLE) ×1 IMPLANT
NEEDLE HYPO 25GX1X1/2 BEV (NEEDLE) ×1 IMPLANT
PACK GENERAL/GYN (CUSTOM PROCEDURE TRAY) ×2 IMPLANT
SOLN 0.9% NACL 1000 ML (IV SOLUTION) ×1 IMPLANT
SOLN 0.9% NACL POUR BTL 1000ML (IV SOLUTION) ×2 IMPLANT
SUT MNCRL AB 4-0 PS2 18 (SUTURE) ×4 IMPLANT
SUT VIC AB 3-0 SH 18 (SUTURE) ×2 IMPLANT
SYR CONTROL 10ML LL (SYRINGE) ×2 IMPLANT
TOWEL GREEN STERILE (TOWEL DISPOSABLE) ×2 IMPLANT
TOWEL GREEN STERILE FF (TOWEL DISPOSABLE) ×2 IMPLANT

## 2024-08-12 NOTE — Addendum Note (Signed)
 Addendum  created 08/12/24 1949 by Waddell Lauraine NOVAK, MD   Clinical Note Signed, Intraprocedure Blocks edited

## 2024-08-12 NOTE — Transfer of Care (Signed)
 Immediate Anesthesia Transfer of Care Note  Patient: Margaret [name]  Procedure(s) Performed: BREAST LUMPECTOMY WITH RADIOACTIVE SEED AND SENTINEL LYMPH NODE BIOPSY W/MAGTRACE (Right: Breast)  Patient Location: PACU  Anesthesia Type:General  Level of Consciousness: drowsy, patient cooperative, and responds to stimulation  Airway & Oxygen Therapy: Patient Spontanous Breathing and Patient connected to face mask oxygen  Post-op Assessment: Report given to RN, Post -op Vital signs reviewed and stable, and Patient moving all extremities X 4  Post vital signs: Reviewed and stable  Last Vitals:  Vitals Value Taken Time  BP 101/59 08/12/24 10:30  Temp    Pulse 74 08/12/24 10:33  Resp 16 08/12/24 10:33  SpO2 100 % 08/12/24 10:33  Vitals shown include unfiled device data.  Last Pain:  Vitals:   08/12/24 0745  PainSc: 0-No pain         Complications: No notable events documented.

## 2024-08-12 NOTE — Anesthesia Postprocedure Evaluation (Signed)
 Anesthesia Post Note  Patient: Margaret [name]  Procedure(s) Performed: BREAST LUMPECTOMY WITH RADIOACTIVE SEED AND SENTINEL LYMPH NODE BIOPSY W/MAGTRACE (Right: Breast)     Patient location during evaluation: PACU Anesthesia Type: General and Regional Level of consciousness: awake Pain management: pain level controlled Vital Signs Assessment: post-procedure vital signs reviewed and stable Respiratory status: spontaneous breathing Cardiovascular status: blood pressure returned to baseline Postop Assessment: no apparent nausea or vomiting Anesthetic complications: no   No notable events documented.  Last Vitals:  Vitals:   08/12/24 1115 08/12/24 1130  BP: 100/72 123/75  Pulse: 68 66  Resp: 20 10  Temp:  (!) 36.4 C  SpO2: 95% 95%    Last Pain:  Vitals:   08/12/24 1130  PainSc: 0-No pain                 Lauraine KATHEE [place]

## 2024-08-12 NOTE — Anesthesia Procedure Notes (Signed)
 Procedure Name: LMA Insertion Date/Time: 08/12/2024 8:48 AM  Performed by: Jolynn Mage, CRNAPre-anesthesia Checklist: Patient identified, Emergency Drugs available, Suction available and Patient being monitored Patient Re-evaluated:Patient Re-evaluated prior to induction Oxygen Delivery Method: Circle system utilized Preoxygenation: Pre-oxygenation with 100% oxygen Induction Type: IV induction Ventilation: Mask ventilation without difficulty LMA: LMA flexible inserted LMA Size: 4.5 Number of attempts: 1 Placement Confirmation: positive ETCO2 and breath sounds checked- equal and bilateral Tube secured with: Tape Dental Injury: Teeth and Oropharynx as per pre-operative assessment

## 2024-08-12 NOTE — Interval H&P Note (Signed)
 History and Physical Interval Note:  08/12/2024 8:01 AM  Margaret [name]  has presented today for surgery, with the diagnosis of RIGHT BREAST CANCER.  The various methods of treatment have been discussed with the patient and family. After consideration of risks, benefits and other options for treatment, the patient has consented to  Procedure(s) with comments: BREAST LUMPECTOMY WITH RADIOACTIVE SEED AND SENTINEL LYMPH NODE BIOPSY (Right) - GEN w/PEC BLOCK RIGHT BREAST RADIOACTIVE SEED LUMPECTOMY SENTINEL NODE BIOPSY as a surgical intervention.  The patient's history has been reviewed, patient examined, no change in status, stable for surgery.  I have reviewed the patient's chart and labs.  Questions were answered to the patient's satisfaction.     Deward Null III
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
## 2024-08-12 NOTE — Op Note (Signed)
 08/12/2024  10:06 AM  PATIENT:  Margaret [name]  73 y.o. female  PRE-OPERATIVE DIAGNOSIS:  RIGHT BREAST CANCER  POST-OPERATIVE DIAGNOSIS:  RIGHT BREAST CANCER  PROCEDURE:  Procedure(s) with comments: RIGHT BREAST RADIOACTIVE SEED LOCALIZED LUMPECTOMY AND DEEP RIGHT AXILLARY SENTINEL LYMPH NODE BIOPSY  SURGEON:  Surgeons and Role:    * Curvin Deward MOULD, MD - Primary  PHYSICIAN ASSISTANT:   ASSISTANTS: none   ANESTHESIA:   local and general  EBL:  minimal   BLOOD ADMINISTERED:none  DRAINS: none   LOCAL MEDICATIONS USED:  MARCAINE     SPECIMEN:  Source of Specimen:  right breast tissue and sentinel nodes x 2  DISPOSITION OF SPECIMEN:  PATHOLOGY  COUNTS:  YES  TOURNIQUET:  * No tourniquets in log *  DICTATION: .Dragon Dictation  After informed consent was obtained the patient was brought to the operating room and placed in the supine position on the operating room table.  After adequate induction of general anesthesia the patient's right chest, breast, and axillary area were prepped with ChloraPrep, allowed to dry, and draped in usual sterile manner.  An appropriate timeout was performed.  Previously an I-125 seed was placed in the lower inner quadrant of the right breast to mark an area of invasive breast cancer.  At this point, 2 cc of mag trace were injected into the subareolar plexus and the breast was massaged for several minutes.  Attention was first turned to the radioactive seed.  The neoprobe was set to I-125 in the area of radioactivity was readily identified.  The area seems to be fairly far in the lower inner quadrant.  The area around this was infiltrated with quarter percent Marcaine.  I elected to make a curvilinear incision along the inframammary fold closest to the signal.  The incision was carried through the skin and subcutaneous tissue sharply with the electrocautery.  Dissection was then carried towards the radioactive seed under the direction of the  neoprobe.  Anteriorly the dissection was carried out between the breast tissue and the subcutaneous fat and skin.  Once this dissection was beyond the area of the cancer I then removed a circular portion of breast tissue sharply with the electrocautery around the radioactive seed while checking the area of radioactivity frequently.  Once the tissue was removed it was oriented with the appropriate paint colors.  A specimen radiograph was obtained that showed the clip and seed to be near the center of the specimen.  The specimen was then sent to pathology for further evaluation.  Hemostasis was achieved using the Bovie electrocautery.  The wound was irrigated with saline and infiltrated with more quarter percent Marcaine.  The cavity was marked with clips.  The incision was then closed with layers of interrupted 3-0 Vicryl stitches.  The skin was closed with a running 4-0 Monocryl subcuticular stitch.  Attention was then turned to the right axilla.  The Sentimag was used to identify a signal in the right axilla.  The area overlying this was infiltrated with quarter percent Marcaine.  A small transversely oriented incision was made overlying the signal.  The incision was carried through the skin and subcutaneous tissue sharply with electrocautery until the deep right axillary space was entered.  The Sentimag was used to direct blunt hemostat dissection.  I was able to identify a lymph node with signal.  This lymph node was excised sharply with the electrocautery and the surrounding small vessels and lymphatics were controlled with clips.  Ex  vivo counts on this node were approximately 2000.  A second area of lesser signal was identified.  It was also excised sharply with the electrocautery and the surrounding small vessels and lymphatics were controlled with clips.  This was sent as sentinel node numbers 1 and 2.  No other hot or palpable nodes were identified in the right axilla.  Hemostasis was achieved using the  Bovie electrocautery.  The deep layer of the axilla was closed with interrupted 3-0 Vicryl stitches.  The skin was closed with a running 4-0 Monocryl subcuticular stitch.  Dermabond dressings were applied.  The patient tolerated the procedure well.  At the end of the case all needle sponge and instrument counts were correct.  The patient was then awakened and taken to recovery in stable condition.  PLAN OF CARE: Discharge to home after PACU  PATIENT DISPOSITION:  PACU - hemodynamically stable.   Delay start of Pharmacological VTE agent (>24hrs) due to surgical blood loss or risk of bleeding: not applicable

## 2024-08-12 NOTE — Anesthesia Procedure Notes (Addendum)
    Anesthesia Regional Block: Pectoralis block   Pre-Anesthetic Checklist: , timeout performed,  Correct Patient, Correct Site, Correct Laterality,  Correct Procedure, Correct Position, site marked,  Risks and benefits discussed,  Surgical consent,  Pre-op evaluation,  At surgeon's request and post-op pain management  Laterality: Upper and Right  Prep: Maximum Sterile Barrier Precautions used, chloraprep       Needles:  Injection technique: Single-shot  Needle Type: Echogenic Stimulator Needle     Needle Length: 10cm  Needle Gauge: 20     Additional Needles:   Procedures:,,,, ultrasound used (permanent image in chart),, #20gu IV placed    Narrative:  Start time: 08/12/2024 7:49 AM End time: 08/12/2024 7:49 AM  Performed by: Personally  Anesthesiologist: Waddell Lauraine NOVAK, MD  Additional Notes: Time-out performed at bedside with patient and RN. Confirmed patient's anticoagulation status, most recent platelet count and allergies. Laterality confirmed by patient and surgical marking. PEC I and PEC II block performed as documented above. No complications noted. VSS throughout.   Margaret Mcbride, MD

## 2024-08-13 ENCOUNTER — Encounter (HOSPITAL_COMMUNITY): Payer: Self-pay | Admitting: General Surgery

## 2024-08-16 LAB — SURGICAL PATHOLOGY

## 2024-08-18 ENCOUNTER — Encounter: Payer: Self-pay | Admitting: *Deleted

## 2024-08-18 ENCOUNTER — Telehealth: Payer: Self-pay | Admitting: *Deleted

## 2024-08-18 DIAGNOSIS — C50311 Malignant neoplasm of lower-inner quadrant of right female breast: Secondary | ICD-10-CM

## 2024-08-18 NOTE — Telephone Encounter (Signed)
 Ordered oncotype per Dr. Pamelia Hoit. Sent requisition to pathology and exact sciences.

## 2024-08-19 ENCOUNTER — Ambulatory Visit: Payer: Self-pay | Admitting: General Surgery

## 2024-08-23 ENCOUNTER — Telehealth: Payer: Self-pay | Admitting: *Deleted

## 2024-08-23 ENCOUNTER — Encounter: Payer: Self-pay | Admitting: *Deleted

## 2024-08-23 DIAGNOSIS — C50311 Malignant neoplasm of lower-inner quadrant of right female breast: Secondary | ICD-10-CM

## 2024-08-23 NOTE — Telephone Encounter (Signed)
 Patient called with a question about her incision from surgery on 10/2. States feels a little hard underneath. Gave her Dr. Yvonne offices number to call the surgeon's office and review with them. She will call him. This navigator sent an inbasket to Dr. Curvin and nurse to make them aware she will be calling.   Asked if she has heard from an [employer] guide. She had requested one at [facility] clinic and chaplain Olam was aware. She stated she has not heard from one yet. Inbasket message sent to Olam to inquire about guide. Patient aware to call back with any needs.

## 2024-08-25 ENCOUNTER — Encounter (HOSPITAL_COMMUNITY): Payer: Self-pay

## 2024-08-26 ENCOUNTER — Telehealth: Payer: Self-pay | Admitting: *Deleted

## 2024-08-26 ENCOUNTER — Telehealth: Payer: Self-pay | Admitting: Obstetrics and Gynecology

## 2024-08-26 ENCOUNTER — Encounter: Payer: Self-pay | Admitting: *Deleted

## 2024-08-26 DIAGNOSIS — C50311 Malignant neoplasm of lower-inner quadrant of right female breast: Secondary | ICD-10-CM

## 2024-08-26 NOTE — Telephone Encounter (Signed)
 Phone call to patient per her request to check on her following surgery.  NA at home.  I left a voice mail that I called to see how she is doing and that I wished her well in her care.  I left the phone number for this office if she needs anything.   Encounter closed.

## 2024-08-26 NOTE — Telephone Encounter (Signed)
Received oncotype results of 20/6%. Referral placed for Dr. Roselind Messier.

## 2024-08-30 ENCOUNTER — Encounter: Payer: Federal, State, Local not specified - PPO | Admitting: Obstetrics and Gynecology

## 2024-08-30 ENCOUNTER — Inpatient Hospital Stay: Attending: Internal Medicine | Admitting: Hematology and Oncology

## 2024-08-30 VITALS — BP 135/69 | HR 89 | Temp 97.7°F | Resp 16 | Ht 66.0 in | Wt 174.4 lb

## 2024-08-30 DIAGNOSIS — Z880 Allergy status to penicillin: Secondary | ICD-10-CM | POA: Diagnosis not present

## 2024-08-30 DIAGNOSIS — Z882 Allergy status to sulfonamides status: Secondary | ICD-10-CM | POA: Diagnosis not present

## 2024-08-30 DIAGNOSIS — Z170 Estrogen receptor positive status [ER+]: Secondary | ICD-10-CM | POA: Insufficient documentation

## 2024-08-30 DIAGNOSIS — Z79899 Other long term (current) drug therapy: Secondary | ICD-10-CM | POA: Insufficient documentation

## 2024-08-30 DIAGNOSIS — Z881 Allergy status to other antibiotic agents status: Secondary | ICD-10-CM | POA: Diagnosis not present

## 2024-08-30 DIAGNOSIS — Z1722 Progesterone receptor negative status: Secondary | ICD-10-CM | POA: Insufficient documentation

## 2024-08-30 DIAGNOSIS — C50311 Malignant neoplasm of lower-inner quadrant of right female breast: Secondary | ICD-10-CM | POA: Diagnosis not present

## 2024-08-30 DIAGNOSIS — Z79811 Long term (current) use of aromatase inhibitors: Secondary | ICD-10-CM | POA: Diagnosis not present

## 2024-08-30 DIAGNOSIS — N6489 Other specified disorders of breast: Secondary | ICD-10-CM | POA: Insufficient documentation

## 2024-08-30 DIAGNOSIS — Z1732 Human epidermal growth factor receptor 2 negative status: Secondary | ICD-10-CM | POA: Insufficient documentation

## 2024-08-30 DIAGNOSIS — M79643 Pain in unspecified hand: Secondary | ICD-10-CM | POA: Diagnosis not present

## 2024-08-30 DIAGNOSIS — K219 Gastro-esophageal reflux disease without esophagitis: Secondary | ICD-10-CM | POA: Diagnosis not present

## 2024-08-30 NOTE — Progress Notes (Signed)
 Patient Care Team: [name], MD as PCP - General (Family Medicine) Raford Riggs, MD as PCP - Cardiology (Cardiology) Cathlyn JAYSON Cary, Bobie BRAVO, MD as Consulting Physician (Obstetrics and Gynecology) Gerome, Devere HERO, RN as Oncology Nurse Navigator Tyree Nanetta SAILOR, RN as Oncology Nurse Navigator Curvin Deward MOULD, MD as Consulting Physician (General Surgery) Odean Potts, MD as Consulting Physician (Hematology and Oncology) Shannon Agent, MD as Consulting Physician (Radiation Oncology)  DIAGNOSIS:  Encounter Diagnosis  Name Primary?   Malignant neoplasm of lower-inner quadrant of right breast of female, estrogen receptor positive (HCC) Yes    SUMMARY OF ONCOLOGIC HISTORY: Oncology History  Malignant neoplasm of lower-inner quadrant of right breast of female, estrogen receptor positive (HCC)  07/09/2024 Initial Diagnosis   Bilateral breast pain evaluation: Mammogram detected right breast asymmetry at 5:30 position: 0.8 cm, axilla negative, ultrasound biopsy: Grade 2 IDC with lobular features, no LVI, ER 100%, PR 0%, Ki67 25%, HER2 negative   07/21/2024 Cancer Staging   Staging form: Breast, AJCC 8th Edition - Clinical stage from 07/21/2024: Stage IA (cT1b, cN0, cM0, G2, ER+, PR+, HER2-) - Signed by Odean Potts, MD on 07/21/2024 Stage prefix: Initial diagnosis Histologic grading system: 3 grade system   08/03/2024 Genetic Testing   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  The report date is August 03, 2024.  The CancerNext-Expanded gene panel offered by [facility] and includes sequencing, rearrangement, and RNA analysis for the following 77 genes: AIP, ALK, APC, ATM, BAP1, BARD1, BMPR1A, BRCA1, BRCA2, BRIP1, CDC73, CDH1, CDK4, CDKN1B, CDKN2A, CEBPA, CHEK2, CTNNA1, DDX41, DICER1, ETV6, FH, FLCN, GATA2, LZTR1, MAX, MBD4, MEN1, MET, MLH1, MSH2, MSH3, MSH6, MUTYH, NF1, NF2, NTHL1, PALB2, PHOX2B, PMS2,  POT1, PRKAR1A, PTCH1, PTEN, RAD51C, RAD51D, RB1, RET, RPS20, RUNX1, SDHA, SDHAF2, SDHB, SDHC, SDHD, SMAD4, SMARCA4, SMARCB1, SMARCE1, STK11, SUFU, TMEM127, TP53, TSC1, TSC2, VHL, and WT1 (sequencing and deletion/duplication); AXIN2, CTNNA1, DDX41, EGFR, HOXB13, KIT, MBD4, MITF, MSH3, PDGFRA, POLD1 and POLE (sequencing only); EPCAM and GREM1 (deletion/duplication only). RNA data is routinely analyzed for use in variant interpretation for all genes.    08/12/2024 Surgery   Right lumpectomy: Grade 2 IDC with focal metaplastic features 1 cm, intermediate grade DCIS, margins are negative, negative for LVI, 0/4 lymph nodes negative, ER 100%, PR 0%, HER2 0 to 1+, Ki-67 25%   08/12/2024 Oncotype testing   Oncotype score 20 (risk of distant recurrence at 10 years: 6%     CHIEF COMPLIANT: Follow-up after surgery  HISTORY OF PRESENT ILLNESS: History of Present Illness Margaret [name] is a 73 year old female with breast cancer who presents for follow-up after surgery and discussion of further treatment options.  She recently underwent surgery for breast cancer. Her oncotype test score is 20. Estrogen receptor is positive, progesterone receptor is negative, and HER2 is negative. She requires anti-estrogen therapy due to estrogen production by fat and adrenal glands.  She is concerned about potential side effects of anti-estrogen therapy, such as joint stiffness, as she experiences chronic hand pain and has received cortisone shots for tendon issues.  She has a PALB2 mutation, increasing her risk for breast cancer and possibly pancreatic cancer. Her family history includes various cancers, with her mother's sister and two cousins having had cancer. There is no known family history of pancreatic cancer. She is considering genetic testing for her children due to her PALB2 mutation.     ALLERGIES:  is allergic to amoxicillin -pot clavulanate, ciprofloxacin, macrobid [nitrofurantoin], pantoprazole   sodium,  and sulfa antibiotics.  MEDICATIONS:  Current Outpatient Medications  Medication Sig Dispense Refill   acetaminophen  (TYLENOL ) 500 MG tablet Take 500-1,000 mg by mouth every 6 (six) hours as needed (pain.).     azelastine (ASTELIN) 0.1 % nasal spray Place 1-2 sprays into both nostrils 2 (two) times daily as needed for allergies. Use in each nostril as directed     Calcium -Vitamin D -Vitamin K (CHEWABLE CALCIUM  PO) Take 1 tablet by mouth in the morning.     ciclopirox  (PENLAC ) 8 % solution Apply topically at bedtime. Apply over nail and surrounding skin. Apply daily over previous coat. After seven (7) days, may remove with alcohol and continue cycle. 6.6 mL 3   diclofenac  Sodium (VOLTAREN ) 1 % GEL Apply 1 Application topically 4 (four) times daily as needed (hand/wrist pain.).     ezetimibe  (ZETIA ) 10 MG tablet Take 1 tablet (10 mg total) by mouth daily. 90 tablet 3   Ferrous Sulfate (IRON PO) Take 1 tablet by mouth in the morning.     fluticasone (FLONASE) 50 MCG/ACT nasal spray Place 1 spray into both nostrils daily as needed for allergies or rhinitis.     HAWTHORN PO Take 1 Capful by mouth 3 (three) times a week.     hydrochlorothiazide  (HYDRODIURIL ) 12.5 MG tablet Take 1 tablet (12.5 mg total) by mouth daily. 90 tablet 3   ibuprofen  (ADVIL ) 200 MG tablet Take 400 mg by mouth every 8 (eight) hours as needed (pain.).     nitroGLYCERIN  (NITROSTAT ) 0.4 MG SL tablet Place 1 tablet (0.4 mg total) under the tongue every 5 (five) minutes as needed for chest pain. 30 tablet 3   omeprazole  (PRILOSEC) 40 MG capsule Take 40 mg by mouth daily before breakfast.     Polyethyl Glycol-Propyl Glycol (SYSTANE) 0.4-0.3 % SOLN Place 1-2 drops into both eyes 3 (three) times daily as needed (dry/irritated eyes.).     Povidone (IVIZIA DRY EYES OP) Place 1 drop into both eyes daily.     rosuvastatin  (CRESTOR ) 5 MG tablet Take 1 tablet by mouth 2 to 3 days per week 90 tablet 3   VITAMIN D  PO Take 1,000 Units by mouth  in the morning.     Omega-3 Fatty Acids (EPA PO) Take 1 capsule by mouth in the morning. Super EPA     No current facility-administered medications for this visit.    PHYSICAL EXAMINATION: ECOG PERFORMANCE STATUS: 1 - Symptomatic but completely ambulatory  Vitals:   08/30/24 1128  BP: 135/69  Pulse: 89  Resp: 16  Temp: 97.7 F (36.5 C)  SpO2: 99%   Filed Weights   08/30/24 1128  Weight: 174 lb 6.4 oz (79.1 kg)    Physical Exam   (exam performed in the presence of a chaperone)  LABORATORY DATA:  I have reviewed the data as listed    Latest Ref Rng & Units 07/21/2024    1:16 PM 06/17/2024    7:18 PM 05/17/2024    6:25 AM  CMP  Glucose 70 - 99 mg/dL 97  893  882   BUN 8 - 23 mg/dL 15  12  13    Creatinine 0.44 - 1.00 mg/dL 9.28  9.29  9.27   Sodium 135 - 145 mmol/L 141  141  140   Potassium 3.5 - 5.1 mmol/L 3.6  3.5  3.5   Chloride 98 - 111 mmol/L 105  103  101   CO2 22 - 32 mmol/L 31  29  28  Calcium  8.9 - 10.3 mg/dL 9.7  9.5  9.7   Total Protein 6.5 - 8.1 g/dL 7.5  7.3    Total Bilirubin 0.0 - 1.2 mg/dL 0.4  0.2    Alkaline Phos 38 - 126 U/L 80  88    AST 15 - 41 U/L 25  23    ALT 0 - 44 U/L 23  13      Lab Results  Component Value Date   WBC 5.0 07/21/2024   HGB 11.7 (L) 07/21/2024   HCT 37.7 07/21/2024   MCV 82.9 07/21/2024   PLT 256 07/21/2024   NEUTROABS 1.6 (L) 07/21/2024    ASSESSMENT & PLAN:  Malignant neoplasm of lower-inner quadrant of right breast of female, estrogen receptor positive (HCC) 08/12/2024:Right lumpectomy: Grade 2 IDC with focal metaplastic features 1 cm, intermediate grade DCIS, margins are negative, negative for LVI, 0/4 lymph nodes negative, ER 100%, PR 0%, HER2 0 to 1+, Ki-67 25% Oncotype DX score 20 (6% risk of distant recurrence)  Treatment plan: Adjuvant radiation therapy Adjuvant antiestrogen therapy with anastrozole 1 mg daily x 5 to 10 years.  PALB2 mutation: Patient will need mammograms alternating with MRIs for  surveillance. Her family will also need to do genetic testing. I sent a message to Dr. Elicia regarding pancreatic cancer surveillance.  Return to clinic after radiation is complete  Assessment & Plan Estrogen receptor positive breast cancer, status post lumpectomy, low-risk Oncotype Status post lumpectomy with low-risk Oncotype score of 20. Estrogen receptor positive, progesterone receptor negative, HER2 negative. No chemotherapy benefit. Radiation therapy recommended to reduce local recurrence risk. Endocrine therapy with anastrozole planned post-radiation for 5 years. Anastrozole reduces estrogen production, potential side effects include hot flashes and joint stiffness. - Schedule appointment with radiation oncologist to discuss radiation therapy options. - Plan for endocrine therapy with anastrozole post-radiation for 5 years. - Monitor for side effects of anastrozole, including hot flashes and joint stiffness.  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 Current breast cancer may be related to mutation. Increased surveillance recommended. No family history of pancreatic cancer, but surveillance plan advised. - Recommend alternating mammograms and MRIs every six months for breast cancer surveillance. - Advise genetic testing for family members, particularly children. - Refer to gastroenterologist for pancreatic cancer surveillance plan.  Hand pain and stiffness, possible inflammatory or rheumatologic etiology Intermittent hand pain and stiffness suggestive of arthritis or other rheumatologic condition. Previous cortisone injection provided temporary relief. - Refer to rheumatologist for further evaluation and management of hand pain and stiffness.  Chronic gastroesophageal reflux disease Chronic GERD managed with omeprazole . Persistent stomach issues noted. - Continue omeprazole  for GERD management. -  Consider endoscopy for further evaluation of persistent stomach issues.      No orders of the defined types were placed in this encounter.  The patient has a good understanding of the overall plan. she agrees with it. she will call with any problems that may develop before the next visit here.  I personally spent a total of 30 minutes in the care of the patient today including preparing to see the patient, getting/reviewing separately obtained history, performing a medically appropriate exam/evaluation, counseling and educating, placing orders, referring and communicating with other health care professionals, documenting clinical information in the EHR, independently interpreting results, communicating results, and coordinating care.   Viinay K Kameren Pargas, MD 08/30/24

## 2024-08-30 NOTE — Assessment & Plan Note (Signed)
 08/12/2024:Right lumpectomy: Grade 2 IDC with focal metaplastic features 1 cm, intermediate grade DCIS, margins are negative, negative for LVI, 0/4 lymph nodes negative, ER 100%, PR 0%, HER2 0 to 1+, Ki-67 25% Oncotype DX score 20 (6% risk of distant recurrence)  Treatment plan: Adjuvant radiation therapy Adjuvant antiestrogen therapy  Return to clinic after radiation is complete

## 2024-08-31 NOTE — Progress Notes (Incomplete)
 Location of Breast Cancer:   Histology per Pathology Report:    Receptor Status: ER(100), PR (0), Her2-neu (), Xp-32(74)  Did patient present with symptoms (if so, please note symptoms) or was this found on screening mammography?:  Patient had bilateral breast pain. Confirmed with mammogram  Past/Anticipated interventions by surgeon, if any:   Past/Anticipated interventions by medical oncology, if any:   Lymphedema issues, if any:  {:18581} {t:21944}   Pain issues, if any:  {:18581} {PAIN DESCRIPTION:21022940}  SAFETY ISSUES: Prior radiation? {:18581} Pacemaker/ICD? {:18581} Possible current pregnancy?{:18581} Is the patient on methotrexate? {:18581}  Current Complaints / other details:  ***

## 2024-09-01 ENCOUNTER — Encounter: Payer: Self-pay | Admitting: General Practice

## 2024-09-01 ENCOUNTER — Ambulatory Visit: Attending: General Surgery | Admitting: Physical Therapy

## 2024-09-01 ENCOUNTER — Encounter: Payer: Self-pay | Admitting: Physical Therapy

## 2024-09-01 DIAGNOSIS — R10A2 Flank pain, left side: Secondary | ICD-10-CM | POA: Insufficient documentation

## 2024-09-01 DIAGNOSIS — M79621 Pain in right upper arm: Secondary | ICD-10-CM | POA: Diagnosis present

## 2024-09-01 DIAGNOSIS — Z170 Estrogen receptor positive status [ER+]: Secondary | ICD-10-CM | POA: Insufficient documentation

## 2024-09-01 DIAGNOSIS — M25611 Stiffness of right shoulder, not elsewhere classified: Secondary | ICD-10-CM | POA: Diagnosis present

## 2024-09-01 DIAGNOSIS — Z483 Aftercare following surgery for neoplasm: Secondary | ICD-10-CM | POA: Diagnosis present

## 2024-09-01 DIAGNOSIS — C50311 Malignant neoplasm of lower-inner quadrant of right female breast: Secondary | ICD-10-CM | POA: Diagnosis present

## 2024-09-01 DIAGNOSIS — R600 Localized edema: Secondary | ICD-10-CM | POA: Diagnosis present

## 2024-09-01 DIAGNOSIS — R293 Abnormal posture: Secondary | ICD-10-CM | POA: Diagnosis present

## 2024-09-01 NOTE — Therapy (Signed)
 OUTPATIENT PHYSICAL THERAPY BREAST CANCER POST OP FOLLOW UP   Patient Name: Margaret [name] MRN: [id number] DOB:[birth year], 73 y.o., female Today's Date: 09/01/2024  END OF SESSION:  PT End of Session - 09/01/24 1005     Visit Number 2    Number of Visits 10    Date for Recertification  09/29/24    PT Start Time 1004    PT Stop Time 1056    PT Time Calculation (min) 52 min    Activity Tolerance Patient tolerated treatment well    Behavior During Therapy [facility] for tasks assessed/performed          Past Medical History:  Diagnosis Date   Allergy    Anemia    Anxiety    Arthritis    CAD in native artery 11/27/2023   Cancer (HCC)    right breast 2025   Colitis    Dyspareunia    Endometriosis    Family history of breast cancer    GERD (gastroesophageal reflux disease)    High cholesterol    Hx of abnormal Pap smear 1980's   Hypertension    Osteopenia    Primary hypertension 11/27/2013   Ulcer    Urinary incontinence    Past Surgical History:  Procedure Laterality Date   ABDOMINAL HYSTERECTOMY  09/11/2008   TVH/BSO--Dr. Nikki with TVT   BILATERAL SALPINGOOPHORECTOMY     2009   BLADDER SUSPENSION  09/11/2008   Dr. Nikki   BREAST BIOPSY Right 07/09/2024   US  RT BREAST BX W LOC DEV 1ST LESION IMG BX SPEC US  GUIDE 07/09/2024 [facility]   BREAST BIOPSY  08/10/2024   US  RT RADIOACTIVE SEED LOC 08/10/2024 [facility]   BREAST LUMPECTOMY WITH RADIOACTIVE SEED AND SENTINEL LYMPH NODE BIOPSY Right 08/12/2024   Procedure: BREAST LUMPECTOMY WITH RADIOACTIVE SEED AND SENTINEL LYMPH NODE BIOPSY W/MAGTRACE;  Surgeon: Curvin Deward MOULD, MD;  Location: [facility] OR;  Service: General;  Laterality: Right;  GEN w/PEC BLOCK RIGHT BREAST RADIOACTIVE SEED LUMPECTOMY SENTINEL NODE BIOPSY   CERVIX LESION DESTRUCTION  11/11/1978   hx abnormal pap--dysplasia   COLONOSCOPY WITH PROPOFOL  Left 10/25/2017   Procedure: COLONOSCOPY WITH PROPOFOL ;  Surgeon: Saintclair Jasper, MD;  Location:  [facility];  Service: Gastroenterology;  Laterality: Left;   Patient Active Problem List   Diagnosis Date Noted   Genetic testing 08/03/2024   Family history of breast cancer    Malignant neoplasm of lower-inner quadrant of right breast of female, estrogen receptor positive (HCC) 07/16/2024   Hyperlipidemia LDL goal <70 03/09/2024   CAD in native artery 11/27/2023   Gastroesophageal reflux disease 06/06/2020   Atypical chest pain 04/21/2020   Closed nondisplaced fracture of proximal phalanx of lesser toe of left foot 11/19/2017   GI bleed 10/23/2017   Hypokalemia 10/23/2017   Nausea vomiting and diarrhea 10/23/2017   Hematochezia 10/23/2017   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 Deward Curvin  REFERRING DIAG: R breast cancer  THERAPY DIAG:  Stiffness of right shoulder, not elsewhere classified  Aftercare following surgery for neoplasm  Localized edema  Pain in right upper arm  Flank pain, left side  Abnormal posture  Malignant neoplasm of lower-inner quadrant of right breast of female, estrogen receptor positive (HCC)  Rationale for Evaluation and Treatment: Rehabilitation  ONSET DATE: 07/13/24  SUBJECTIVE:                                                                                                                                                                                           SUBJECTIVE STATEMENT: I have pain where they took the lymph nodes and if I reach too far I can really feel it. The breast has been a little tender. I tried taking off the compression bra for a while yesterday and it felt a little uncomfortable.   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 It measures 1.1  cm and is located in the lower inner quadrant. It is ER positive, PR negative, and HER2 negative with a Ki67 of 25%. 08/12/24- R breast lumpectomy and SLNB 0/4  PATIENT GOALS:  Reassess how my recovery is going related to arm function, pain, and swelling.  PAIN:  Are you having pain? Yes: NPRS scale: 5/10 Pain location: R axilla Pain description: soreness Aggravating factors: reaching with the arm Relieving factors: pt is unsure, staying still, exercises  PRECAUTIONS: Recent Surgery, right UE Lymphedema risk,   RED FLAGS: None   ACTIVITY LEVEL / LEISURE: currently doing post op exercises   OBJECTIVE:   PATIENT SURVEYS:  QUICK DASH:  Quick Dash - 09/01/24 0001     Open a tight or new jar Moderate difficulty    Do heavy household chores (wash walls, wash floors) Moderate difficulty    Carry a shopping bag or briefcase Mild difficulty    Wash your back Mild difficulty    Use a knife to cut food Mild difficulty    Recreational activities in which you take some force or impact through your arm, shoulder, or hand (golf, hammering, tennis) Moderate difficulty    During the past week, to what extent has your arm, shoulder or hand problem interfered with your normal social activities with family, friends, neighbors, or groups? Slightly    During the past week, to what extent has your arm, shoulder or hand problem limited your work or other regular daily activities Modererately    Arm, shoulder, or hand pain. Moderate    Tingling (pins and needles) in your arm, shoulder, or hand Moderate    Difficulty Sleeping Mild difficulty    DASH Score 38.64 %           OBSERVATIONS: Fibrotic area palpable in area of SLNB - feels similar to a seroma, healing lumpectomy scar with glue still intact, post op swelling visible in inferior breast  POSTURE:  Forward head and rounded shoulders posture  UPPER EXTREMITY AROM/PROM:   A/PROM RIGHT   eval   RIGHT 09/01/24  Shoulder extension 43 62   Shoulder flexion 149 148  Shoulder abduction 155 130  Shoulder internal rotation 79 69  Shoulder external rotation 79 80                          (Blank rows = not tested)   A/PROM LEFT   eval  Shoulder extension 53  Shoulder flexion 143  Shoulder abduction 161  Shoulder internal rotation 80  Shoulder external rotation 90                          (Blank rows = not tested)   CERVICAL AROM: All within functional limits   UPPER EXTREMITY STRENGTH: WFL   LYMPHEDEMA ASSESSMENTS (in cm):    LANDMARK RIGHT   eval 09/01/24  10 cm proximal to olecranon process 30.9 30.5  Olecranon process 27.4 27.8  10 cm proximal to ulnar styloid process 24.1 22  Just proximal to ulnar styloid process 16.8 16.5  Across hand at thumb web space 19.4 19.6  At base of 2nd digit 5.9 5.7  (Blank rows = not tested)   LANDMARK LEFT   eval  10 cm proximal to olecranon process 30  Olecranon process 26.9  10 cm proximal to ulnar styloid process 22.4  Just proximal to ulnar styloid process 16.1  Across hand at thumb web space 19  At base of 2nd digit 6  (Blank rows = not tested)  Surgery type/Date: 08/12/24 R breast lumpectomy and SLNB Number of lymph nodes removed: 0/4 Current/past treatment (chemo, radiation, hormone therapy): will require radiation and hormone therapy  Other symptoms:  Heaviness/tightness Yes Pain Yes Pitting edema No Infections No Decreased scar mobility Yes Stemmer sign No  PATIENT EDUCATION:  Education details: ABC class, need for compression bra, axillary cording, seroma vs scar tissue, need for stretching for cording, side bending QL stretch Person educated: Patient Education method: Explanation, Demonstration, and Handouts Education comprehension: verbalized understanding  HOME EXERCISE PROGRAM: Reviewed previously given post op HEP. Wear compression bra Quadratus lumborum stretch 1-2x/daily holding for 30-60 seconds  ASSESSMENT:  CLINICAL IMPRESSION: Pt  reports to PT after recently undergoing a R lumpectomy and SLNB (0/4) on 08/12/24. She developed axillary cording so has limited R shoulder ROM post op. She also has an area of fibrosis/scar tissue/possible seroma in area of SLNB scar but reports it is getting smaller. There is some post op swelling in her inferior breast. Educated pt to continue to wear her post op bra to manage breast swelling. She has L flank pain that is most likely due to tight QL muscle and there is tenderness to palpation of this muscle. Educated pt in QL stretch to begin doing at home. She would benefit form skilled PT servies to improve R shoulder ROM, decrease cording, decrease post op edema, decrease L flank pain and progress pt towards independent management.   Pt will benefit from skilled therapeutic intervention to improve on the following deficits: Decreased knowledge of precautions, impaired UE functional use, pain, decreased ROM, postural dysfunction.   PT treatment/interventions: ADL/Self care home management, [phone]- PT Re-evaluation, 97110-Therapeutic exercises, 97530- Therapeutic activity, [id number]- Neuromuscular re-education, 97535- Self Care, [id number]- Manual therapy, [phone]- Orthotic Initial, [phone]- Orthotic/Prosthetic subsequent, and Patient/Family education   GOALS: Goals reviewed with patient? Yes  GOALS MET AT EVAL:  GOALS Name Target Date Goal  status  1 Pt will be able to verbalize understanding of pertinent lymphedema risk reduction practices relevant to her dx specifically related to skin care.  Baseline:  No knowledge Eval Achieved at eval  2 Pt will be able to return demo and/or verbalize understanding of the post op HEP related to regaining shoulder ROM. Baseline:  No knowledge Eval Achieved at eval  3 Pt will be able to verbalize understanding of the importance of viewing the post op After Breast CA Class video for further lymphedema risk reduction education and therapeutic exercise.  Baseline:  No  knowledge Eval Achieved at eval   LONG TERM GOALS:  (STG=LTG)  GOALS Name Target Date  Goal status  1 Pt will demonstrate she has regained full shoulder ROM and function post operatively compared to baselines.  Baseline: 09/29/24 INITIAL  2 Pt will demonstrate 155 degrees of R shoulder abduction to allow her to reach out to the side. 09/29/24 INITIAL  3 Pt will report a 75% improvement in L flank pain to allow improved comfort and mobility. 09/29/24 INITIAL  4 Pt will report 75% improvement in pain from cording with R shoulder abduction to allow improved comfort.  09/29/24 INITIAL  5 Pt will be independent in a home exercise program for continued stretching and strengthening.  09/29/24 INITIAL     PLAN:  PT FREQUENCY/DURATION: 2x/wk for 4 weeks  PLAN FOR NEXT SESSION: pulleys, ball, MFR to cording in R axilla, QL stretches on L, PROM to R shoulder   [facility]  [address], [address]  [place] [address]  [phone]  After Breast Cancer Class Video It is recommended you view the ABC class video to be educated on lymphedema risk reduction. This video lasts for about 30 minutes. It can be viewed on our website here: [email or web address]  Scar massage You can begin gentle scar massage to you incision sites. Gently place one hand on the incision and move the skin (without sliding on the skin) in various directions. Do this for a few minutes and then you can gently massage either coconut oil or vitamin E cream into the scars.  Compression garment You should continue wearing your compression bra until you feel like you no longer have swelling.  Home exercise Program Continue doing the exercises you were given until you feel like you can do them without feeling any tightness at the end.   Walking Program Studies show that 30 minutes of walking per day (fast enough to elevate your heart rate)  can significantly reduce the risk of a cancer recurrence. If you can't walk due to other medical reasons, we encourage you to find another activity you could do (like a stationary bike or water exercise).  Posture After breast cancer surgery, people frequently sit with rounded shoulders posture because it puts their incisions on slack and feels better. If you sit like this and scar tissue forms in that position, you can become very tight and have pain sitting or standing with good posture. Try to be aware of your posture and sit and stand up tall to heal properly.  Follow up PT: It is recommended you return every 3 months for the first 3 years following surgery to be assessed on the SOZO machine for an L-Dex score. This helps prevent clinically significant lymphedema in 95% of patients. These follow up screens are 10 minute appointments that you are not billed for.  [facility], PT 09/01/2024, 11:17 AM

## 2024-09-01 NOTE — Progress Notes (Signed)
 Radiation Oncology         [phone] ________________________________  Name: Margaret [name] MRN: [id number]  Date: 09/02/2024  DOB: 05-Jun-1951  Re-Evaluation Note  CC: [name], MD  Odean Potts, MD  No diagnosis found.  Diagnosis: The encounter diagnosis was Malignant neoplasm of lower-inner quadrant of right breast of female, estrogen receptor positive (HCC).   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 She was seen in the multidisciplinary breast clinic on 07/21/24.   Since consultation, she underwent genetic testing on 08/03/24. 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  She opted to proceed with a right breast lumpectomy with radioactive seed and sentinel lymph node biopsy on 08/12/24 under the care of Dr. Curvin. Pathology revealed grade 2  invasive ductal carcinoma ith focal metaplastic features and intermediate to high grade DCIS with necrosis. Resection margins are negative for carcinoma; closest is the medial margin at 0.3 cm. All examined lymph nodes are negative margins. Prognostic indicators significant for: estrogen receptor 100%, positive, strong staining intensity; progesterone receptor 0% negative; Proliferation marker Ki67 at 25%; Her2 status Negative (0-1+) ; Grade 2.     Oncotype DX was obtained on the final surgical sample and the recurrence score of 20 predicts a risk of recurrence outside the breast over the next 9 years of 6%, if the patient's only systemic therapy is an antiestrogen for 5 years.  It also predicts no significant benefit from chemotherapy.  Most recent follow up with Dr. Odean on 10/20, they opted to proceed with radiation therapy followed by adjuvant antiestrogen therapy with anastrozole 1 mg daily x 5 to 10 years. Dr.  Gudena also recommends alternating mammograms and MRIs every six months for breast cancer surveillance due to her genetic mutation.    On review of systems, the patient reports ***. She denies *** and any other symptoms.    Allergies:  is allergic to amoxicillin -pot clavulanate, ciprofloxacin, macrobid [nitrofurantoin], pantoprazole  sodium, and sulfa antibiotics.  Meds: Current Outpatient Medications  Medication Sig Dispense Refill   acetaminophen  (TYLENOL ) 500 MG tablet Take 500-1,000 mg by mouth every 6 (six) hours as needed (pain.).     azelastine (ASTELIN) 0.1 % nasal spray Place 1-2 sprays into both nostrils 2 (two) times daily as needed for allergies. Use in each nostril as directed     Calcium -Vitamin D -Vitamin K (CHEWABLE CALCIUM  PO) Take 1 tablet by mouth in the morning.     ciclopirox  (PENLAC ) 8 % solution Apply topically at bedtime. Apply over nail and surrounding skin. Apply daily over previous coat. After seven (7) days, may remove with alcohol and continue cycle. 6.6 mL 3   diclofenac  Sodium (VOLTAREN ) 1 % GEL Apply 1 Application topically 4 (four) times daily as needed (hand/wrist pain.).     ezetimibe  (ZETIA ) 10 MG tablet Take 1 tablet (10 mg total) by mouth daily. 90 tablet 3   Ferrous Sulfate (IRON PO) Take 1 tablet by mouth in the morning.     fluticasone (FLONASE) 50 MCG/ACT nasal spray Place 1 spray into both nostrils daily as needed for allergies or rhinitis.     HAWTHORN PO Take 1 Capful by mouth 3 (three) times a week.     hydrochlorothiazide  (HYDRODIURIL ) 12.5 MG tablet Take 1 tablet (12.5 mg total) by mouth daily. 90 tablet  3   ibuprofen  (ADVIL ) 200 MG tablet Take 400 mg by mouth every 8 (eight) hours as needed (pain.).     nitroGLYCERIN  (NITROSTAT ) 0.4 MG SL tablet Place 1 tablet (0.4 mg total) under the tongue every 5 (five) minutes as needed for chest pain. 30 tablet 3   Omega-3 Fatty Acids (EPA PO) Take 1 capsule by mouth in the morning. Super EPA     omeprazole   (PRILOSEC) 40 MG capsule Take 40 mg by mouth daily before breakfast.     Polyethyl Glycol-Propyl Glycol (SYSTANE) 0.4-0.3 % SOLN Place 1-2 drops into both eyes 3 (three) times daily as needed (dry/irritated eyes.).     Povidone (IVIZIA DRY EYES OP) Place 1 drop into both eyes daily.     rosuvastatin  (CRESTOR ) 5 MG tablet Take 1 tablet by mouth 2 to 3 days per week 90 tablet 3   VITAMIN D  PO Take 1,000 Units by mouth in the morning.     No current facility-administered medications for this visit.    Physical Findings: The patient is in no acute distress. Patient is alert and oriented.  vitals were not taken for this visit.  No significant changes. Lungs are clear to auscultation bilaterally. Heart has regular rate and rhythm. No palpable cervical, supraclavicular, or axillary adenopathy. Abdomen soft, non-tender, normal bowel sounds. *** Breast: no palpable mass, nipple discharge or bleeding. *** Breast: ***  Lab Findings: Lab Results  Component Value Date   WBC 5.0 07/21/2024   HGB 11.7 (L) 07/21/2024   HCT 37.7 07/21/2024   MCV 82.9 07/21/2024   PLT 256 07/21/2024    Radiographic Findings: MM Breast Surgical Specimen Result Date: 08/12/2024 CLINICAL DATA:  Post lumpectomy specimen radiograph EXAM: SPECIMEN RADIOGRAPH OF THE RIGHT BREAST COMPARISON:  Previous exam(s). FINDINGS: Status post excision of the right breast. The radioactive seed and biopsy marker clip are present and completely intact. IMPRESSION: Specimen radiograph of the right breast. Electronically Signed   By: Inocente Ast M.D.   On: 08/12/2024 10:56   MM CLIP PLACEMENT RIGHT Result Date: 08/10/2024 CLINICAL DATA:  Assess radioactive seed placement following ultrasound-guided localization of a right breast carcinoma prior to surgical excision. EXAM: DIAGNOSTIC RIGHT MAMMOGRAM POST ULTRASOUND-GUIDED RADIOACTIVE SEED PLACEMENT COMPARISON:  Previous exam(s). ACR Breast Density Category b: There are scattered areas of  fibroglandular density. FINDINGS: Mammographic images were obtained following ultrasound-guided radioactive seed placement. These demonstrate the radioactive seed to lie adjacent to the coil shaped post biopsy marker clip along the inferior margin of the mass. IMPRESSION: Appropriate location of the radioactive seed. Final Assessment: Post Procedure Mammograms for Seed Placement BI-RADS CATEGORY  59M: Post-Procedure Mammogram for Marker Placement Electronically Signed   By: Alm Parkins M.D.   On: 08/10/2024 11:25   US  RT RADIOACTIVE SEED LOC Result Date: 08/10/2024 CLINICAL DATA:  Patient presents for radioactive seed localization of a right breast carcinoma prior to surgical excision. EXAM: ULTRASOUND GUIDED RADIOACTIVE SEED LOCALIZATION OF THE RIGHT BREAST COMPARISON:  Previous exam(s). FINDINGS: Patient presents for radioactive seed localization prior to . I met with the patient and we discussed the procedure of seed localization including benefits and alternatives. We discussed the high likelihood of a successful procedure. We discussed the risks of the procedure including infection, bleeding, tissue injury and further surgery. We discussed the low dose of radioactivity involved in the procedure. Informed, written consent was given. The usual time-out protocol was performed immediately prior to the procedure. Using ultrasound guidance, sterile technique, 1% lidocaine  and an  I-125 radioactive seed, the small hypoechoic mass and associated post biopsy marker clip were localized using an inferior approach. The follow-up mammogram images confirm the seed in the expected location and were marked for Dr. Curvin. Follow-up survey of the patient confirms presence of the radioactive seed. Order number of I-125 seed:  [id number]. Total activity:  0.239 millicuries.  Reference Date: 06/10/2024. The patient tolerated the procedure well and was [facility] from the [facility]. She was given instructions regarding seed  removal. IMPRESSION: Radioactive seed localization of the right breast. No apparent complications. Electronically Signed   By: Alm Parkins M.D.   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 ***  -----------------------------------  Lynwood CHARM Nasuti, PhD, MD  This document serves as a record of services personally performed by Lynwood Nasuti, MD. It was created on his behalf by Reymundo Cartwright, a trained medical scribe. The creation of this record is based on the scribe's personal observations and the provider's statements to them. This document has been checked and approved by the attending provider.

## 2024-09-01 NOTE — Progress Notes (Signed)
 [facility] Spiritual Care Note  Followed up with Margaret [name] by phone. She reports doing well overall, though coping with post-op healing and anticipating radiation is a lot to juggle. We plan to speak by phone next week to schedule an in-person follow-up appointment in conjunction with her forthcoming radiation treatment schedule.  [address] [place], [place], [facility] Pager [phone] Voicemail [phone]

## 2024-09-02 ENCOUNTER — Encounter: Payer: Self-pay | Admitting: Radiation Oncology

## 2024-09-02 ENCOUNTER — Ambulatory Visit
Admission: RE | Admit: 2024-09-02 | Discharge: 2024-09-02 | Disposition: A | Discharge status: Home / Self Care | Source: Ambulatory Visit | Attending: Radiation Oncology | Admitting: Radiation Oncology

## 2024-09-02 VITALS — BP 131/70 | HR 86 | Temp 97.6°F | Resp 18 | Ht 66.0 in | Wt 174.8 lb

## 2024-09-02 DIAGNOSIS — Z1722 Progesterone receptor negative status: Secondary | ICD-10-CM | POA: Diagnosis not present

## 2024-09-02 DIAGNOSIS — C50311 Malignant neoplasm of lower-inner quadrant of right female breast: Secondary | ICD-10-CM | POA: Insufficient documentation

## 2024-09-02 DIAGNOSIS — Z170 Estrogen receptor positive status [ER+]: Secondary | ICD-10-CM | POA: Diagnosis not present

## 2024-09-02 DIAGNOSIS — Z1501 Genetic susceptibility to malignant neoplasm of breast: Secondary | ICD-10-CM | POA: Diagnosis not present

## 2024-09-02 DIAGNOSIS — Z1732 Human epidermal growth factor receptor 2 negative status: Secondary | ICD-10-CM | POA: Diagnosis not present

## 2024-09-02 DIAGNOSIS — Z79899 Other long term (current) drug therapy: Secondary | ICD-10-CM | POA: Diagnosis not present

## 2024-09-07 ENCOUNTER — Ambulatory Visit: Admitting: Physical Therapy

## 2024-09-07 ENCOUNTER — Encounter: Payer: Self-pay | Admitting: *Deleted

## 2024-09-07 ENCOUNTER — Encounter: Payer: Self-pay | Admitting: Physical Therapy

## 2024-09-07 DIAGNOSIS — M25611 Stiffness of right shoulder, not elsewhere classified: Secondary | ICD-10-CM | POA: Diagnosis not present

## 2024-09-07 DIAGNOSIS — R10A2 Flank pain, left side: Secondary | ICD-10-CM

## 2024-09-07 DIAGNOSIS — M79621 Pain in right upper arm: Secondary | ICD-10-CM

## 2024-09-07 DIAGNOSIS — R293 Abnormal posture: Secondary | ICD-10-CM

## 2024-09-07 DIAGNOSIS — R600 Localized edema: Secondary | ICD-10-CM

## 2024-09-07 DIAGNOSIS — Z170 Estrogen receptor positive status [ER+]: Secondary | ICD-10-CM

## 2024-09-07 DIAGNOSIS — Z483 Aftercare following surgery for neoplasm: Secondary | ICD-10-CM

## 2024-09-07 NOTE — Therapy (Signed)
 OUTPATIENT PHYSICAL THERAPY BREAST CANCER POST OP FOLLOW UP   Patient Name: Margaret [name] MRN: [id number] DOB:07/03/1951, 73 y.o., female Today's Date: 09/07/2024  END OF SESSION:  PT End of Session - 09/07/24 1404     Visit Number 3    Number of Visits 10    Date for Recertification  09/29/24    PT Start Time 1404    PT Stop Time 1455    PT Time Calculation (min) 51 min    Activity Tolerance Patient tolerated treatment well    Behavior During Therapy [facility] for tasks assessed/performed          Past Medical History:  Diagnosis Date   Allergy    Anemia    Anxiety    Arthritis    CAD in native artery 11/27/2023   Cancer (HCC)    right breast 2025   Colitis    Dyspareunia    Endometriosis    Family history of breast cancer    GERD (gastroesophageal reflux disease)    High cholesterol    Hx of abnormal Pap smear 1980's   Hypertension    Osteopenia    Primary hypertension 11/27/2013   Ulcer    Urinary incontinence    Past Surgical History:  Procedure Laterality Date   ABDOMINAL HYSTERECTOMY  09/11/2008   TVH/BSO--Dr. Nikki with TVT   BILATERAL SALPINGOOPHORECTOMY     2009   BLADDER SUSPENSION  09/11/2008   Dr. Nikki   BREAST BIOPSY Right 07/09/2024   US  RT BREAST BX W LOC DEV 1ST LESION IMG BX SPEC US  GUIDE 07/09/2024 [facility]   BREAST BIOPSY  08/10/2024   US  RT RADIOACTIVE SEED LOC 08/10/2024 [facility]   BREAST LUMPECTOMY WITH RADIOACTIVE SEED AND SENTINEL LYMPH NODE BIOPSY Right 08/12/2024   Procedure: BREAST LUMPECTOMY WITH RADIOACTIVE SEED AND SENTINEL LYMPH NODE BIOPSY W/MAGTRACE;  Surgeon: Curvin Deward MOULD, MD;  Location: [facility] OR;  Service: General;  Laterality: Right;  GEN w/PEC BLOCK RIGHT BREAST RADIOACTIVE SEED LUMPECTOMY SENTINEL NODE BIOPSY   CERVIX LESION DESTRUCTION  11/11/1978   hx abnormal pap--dysplasia   COLONOSCOPY WITH PROPOFOL  Left 10/25/2017   Procedure: COLONOSCOPY WITH PROPOFOL ;  Surgeon: Saintclair Jasper, MD;  Location:  [facility];  Service: Gastroenterology;  Laterality: Left;   Patient Active Problem List   Diagnosis Date Noted   Genetic testing 08/03/2024   Family history of breast cancer    Malignant neoplasm of lower-inner quadrant of right breast of female, estrogen receptor positive (HCC) 07/16/2024   Hyperlipidemia LDL goal <70 03/09/2024   CAD in native artery 11/27/2023   Gastroesophageal reflux disease 06/06/2020   Atypical chest pain 04/21/2020   Closed nondisplaced fracture of proximal phalanx of lesser toe of left foot 11/19/2017   GI bleed 10/23/2017   Hypokalemia 10/23/2017   Nausea vomiting and diarrhea 10/23/2017   Hematochezia 10/23/2017   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 Deward Curvin  REFERRING DIAG: R breast cancer  THERAPY DIAG:  Stiffness of right shoulder, not elsewhere classified  Aftercare following surgery for neoplasm  Localized edema  Pain in right upper arm  Flank pain, left side  Abnormal posture  Malignant neoplasm of lower-inner quadrant of right breast of female, estrogen receptor positive (HCC)  Rationale for Evaluation and Treatment: Rehabilitation  ONSET DATE: 07/13/24  SUBJECTIVE:                                                                                                                                                                                           SUBJECTIVE STATEMENT: The doctor thought it was a seroma. I will see the radiation doctor on Thursday.   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 It measures 1.1 cm and is located in the lower inner quadrant. It is ER positive, PR negative, and HER2 negative with a Ki67 of 25%. 08/12/24- R breast  lumpectomy and SLNB 0/4  PATIENT GOALS:  Reassess how my recovery is going related to arm function, pain, and swelling.  PAIN:  Are you having pain? Yes: NPRS scale: 4/10 Pain location: R axilla Pain description: soreness Aggravating factors: reaching with the arm Relieving factors: pt is unsure, staying still, exercises  PRECAUTIONS: Recent Surgery, right UE Lymphedema risk,   RED FLAGS: None   ACTIVITY LEVEL / LEISURE: currently doing post op exercises   OBJECTIVE:   PATIENT SURVEYS:  QUICK DASH:     OBSERVATIONS: Fibrotic area palpable in area of SLNB - feels similar to a seroma, healing lumpectomy scar with glue still intact, post op swelling visible in inferior breast  POSTURE:  Forward head and rounded shoulders posture   UPPER EXTREMITY AROM/PROM:   A/PROM RIGHT   eval   RIGHT 09/01/24  Shoulder extension 43 62  Shoulder flexion 149 148  Shoulder abduction 155 130  Shoulder internal rotation 79 69  Shoulder external rotation 79 80                          (Blank rows = not tested)   A/PROM LEFT   eval  Shoulder extension 53  Shoulder flexion 143  Shoulder abduction 161  Shoulder internal rotation 80  Shoulder external rotation 90                          (Blank rows = not tested)   CERVICAL AROM: All within functional limits   UPPER EXTREMITY STRENGTH: WFL   LYMPHEDEMA ASSESSMENTS (in cm):    LANDMARK RIGHT   eval 09/01/24  10 cm proximal to olecranon process 30.9 30.5  Olecranon process 27.4 27.8  10 cm proximal to ulnar styloid process 24.1 22  Just proximal to ulnar styloid process 16.8 16.5  Across hand at thumb web space 19.4 19.6  At base of 2nd digit 5.9 5.7  (Blank rows =  not tested)   LANDMARK LEFT   eval  10 cm proximal to olecranon process 30  Olecranon process 26.9  10 cm proximal to ulnar styloid process 22.4  Just proximal to ulnar styloid process 16.1  Across hand at thumb web space 19  At base of 2nd digit 6   (Blank rows = not tested)  Surgery type/Date: 08/12/24 R breast lumpectomy and SLNB Number of lymph nodes removed: 0/4 Current/past treatment (chemo, radiation, hormone therapy): will require radiation and hormone therapy  Other symptoms:  Heaviness/tightness Yes Pain Yes Pitting edema No Infections No Decreased scar mobility Yes Stemmer sign No  TREATMENT PERFORMED: 09/07/24: THERAPEUTIC EXERCISE: Pulleys x 2 min in direction of flexion and 2 min in direction of abduction Ball up wall x 10 reps in direction of flexion and 10 reps in direction of R shoulder abduction MANUAL THERAPY: MFR to cording in R axilla with numerous cords palpable with UE in 120 degrees of abduction support on pillow with elbow bent Gentle STM to area of fibrosis/seroma in R breast - educated pt to let her radiation dr know about this to be sure it will not interfere with radation  PATIENT EDUCATION:  Education details: ABC class, need for compression bra, axillary cording, seroma vs scar tissue, need for stretching for cording, side bending QL stretch Person educated: Patient Education method: Explanation, Demonstration, and Handouts Education comprehension: verbalized understanding  HOME EXERCISE PROGRAM: Reviewed previously given post op HEP. Wear compression bra Quadratus lumborum stretch 1-2x/daily holding for 30-60 seconds  ASSESSMENT:  CLINICAL IMPRESSION: Began AAROM exercises today with pt feeling strong stretch with these. Began MFR to cording with numerous cords palpable in axilla. Pt also has a large area of fibrosis/possible seroma in inferior breast that was palpable today. Performed light STM to this area but pt had increased tenderness. Educated pt to let her radiation doctor know to be sure it will not interfere with radiation. Educated pt to continue to wear her compression bra. She would benefit from breast MLD next session as her breast is heavy and demonstrates post op edema.   Pt  will benefit from skilled therapeutic intervention to improve on the following deficits: Decreased knowledge of precautions, impaired UE functional use, pain, decreased ROM, postural dysfunction.   PT treatment/interventions: ADL/Self care home management, [phone]- PT Re-evaluation, 97110-Therapeutic exercises, 97530- Therapeutic activity, [id number]- Neuromuscular re-education, 97535- Self Care, 02859- Manual therapy, [phone]- Orthotic Initial, [phone]- Orthotic/Prosthetic subsequent, and Patient/Family education   GOALS: Goals reviewed with patient? Yes  GOALS MET AT EVAL:  GOALS Name Target Date Goal status  1 Pt will be able to verbalize understanding of pertinent lymphedema risk reduction practices relevant to her dx specifically related to skin care.  Baseline:  No knowledge Eval Achieved at eval  2 Pt will be able to return demo and/or verbalize understanding of the post op HEP related to regaining shoulder ROM. Baseline:  No knowledge Eval Achieved at eval  3 Pt will be able to verbalize understanding of the importance of viewing the post op After Breast CA Class video for further lymphedema risk reduction education and therapeutic exercise.  Baseline:  No knowledge Eval Achieved at eval   LONG TERM GOALS:  (STG=LTG)  GOALS Name Target Date  Goal status  1 Pt will demonstrate she has regained full shoulder ROM and function post operatively compared to baselines.  Baseline: 09/29/24 INITIAL  2 Pt will demonstrate 155 degrees of R shoulder abduction to allow her to reach out to  the side. 09/29/24 INITIAL  3 Pt will report a 75% improvement in L flank pain to allow improved comfort and mobility. 09/29/24 INITIAL  4 Pt will report 75% improvement in pain from cording with R shoulder abduction to allow improved comfort.  09/29/24 INITIAL  5 Pt will be independent in a home exercise program for continued stretching and strengthening.  09/29/24 INITIAL     PLAN:  PT FREQUENCY/DURATION: 2x/wk  for 4 weeks  PLAN FOR NEXT SESSION: MLD R breast, pulleys, ball, MFR to cording in R axilla, QL stretches on L, PROM to R shoulder   [facility]  [address], [address]  [place] [address]  [phone]  After Breast Cancer Class Video It is recommended you view the ABC class video to be educated on lymphedema risk reduction. This video lasts for about 30 minutes. It can be viewed on our website here: [email or web address]  Scar massage You can begin gentle scar massage to you incision sites. Gently place one hand on the incision and move the skin (without sliding on the skin) in various directions. Do this for a few minutes and then you can gently massage either coconut oil or vitamin E cream into the scars.  Compression garment You should continue wearing your compression bra until you feel like you no longer have swelling.  Home exercise Program Continue doing the exercises you were given until you feel like you can do them without feeling any tightness at the end.   Walking Program Studies show that 30 minutes of walking per day (fast enough to elevate your heart rate) can significantly reduce the risk of a cancer recurrence. If you can't walk due to other medical reasons, we encourage you to find another activity you could do (like a stationary bike or water exercise).  Posture After breast cancer surgery, people frequently sit with rounded shoulders posture because it puts their incisions on slack and feels better. If you sit like this and scar tissue forms in that position, you can become very tight and have pain sitting or standing with good posture. Try to be aware of your posture and sit and stand up tall to heal properly.  Follow up PT: It is recommended you return every 3 months for the first 3 years following surgery to be assessed on the SOZO machine for an L-Dex score. This helps  prevent clinically significant lymphedema in 95% of patients. These follow up screens are 10 minute appointments that you are not billed for.  [facility] [place], PT 09/07/2024, 3:02 PM

## 2024-09-08 ENCOUNTER — Encounter: Payer: Self-pay | Admitting: General Practice

## 2024-09-08 NOTE — Progress Notes (Signed)
 [facility] Spiritual Care Note  Followed up with Margaret [name] by phone, setting up appointment tomorrow in Spiritual Care office at ca 11:30 (following her radiation sim appointment).  [address] Olam Corr[place], [place], [facility] Pager [phone] Voicemail [phone]

## 2024-09-09 ENCOUNTER — Encounter: Payer: Self-pay | Admitting: Physical Therapy

## 2024-09-09 ENCOUNTER — Ambulatory Visit: Admitting: Physical Therapy

## 2024-09-09 ENCOUNTER — Encounter: Payer: Self-pay | Admitting: General Practice

## 2024-09-09 ENCOUNTER — Ambulatory Visit
Admission: RE | Admit: 2024-09-09 | Discharge: 2024-09-09 | Disposition: A | Discharge status: Home / Self Care | Source: Ambulatory Visit | Attending: Radiation Oncology | Admitting: Radiation Oncology

## 2024-09-09 DIAGNOSIS — M25611 Stiffness of right shoulder, not elsewhere classified: Secondary | ICD-10-CM

## 2024-09-09 DIAGNOSIS — Z170 Estrogen receptor positive status [ER+]: Secondary | ICD-10-CM | POA: Insufficient documentation

## 2024-09-09 DIAGNOSIS — C50311 Malignant neoplasm of lower-inner quadrant of right female breast: Secondary | ICD-10-CM | POA: Diagnosis present

## 2024-09-09 DIAGNOSIS — R10A2 Flank pain, left side: Secondary | ICD-10-CM

## 2024-09-09 DIAGNOSIS — R600 Localized edema: Secondary | ICD-10-CM

## 2024-09-09 DIAGNOSIS — R293 Abnormal posture: Secondary | ICD-10-CM

## 2024-09-09 DIAGNOSIS — M79621 Pain in right upper arm: Secondary | ICD-10-CM

## 2024-09-09 DIAGNOSIS — Z483 Aftercare following surgery for neoplasm: Secondary | ICD-10-CM

## 2024-09-09 NOTE — Patient Instructions (Signed)
 Over Head Pull: Narrow and Wide Grip   Cancer Rehab [phone]   On back, knees bent, feet flat, band across thighs, elbows straight but relaxed. Pull hands apart (start). Keeping elbows straight, bring arms up and over head, hands toward floor. Keep pull steady on band. Hold momentarily. Return slowly, keeping pull steady, back to start. Then do same with a wider grip on the band (past shoulder width) Repeat _10__ times. Band color __yellow____   Side Pull: Double Arm   On back, knees bent, feet flat. Arms perpendicular to body, shoulder level, elbows straight but relaxed. Pull arms out to sides, elbows straight. Resistance band comes across collarbones, hands toward floor. Hold momentarily. Slowly return to starting position. Repeat _10__ times. Band color _yellow____   Sword   On back, knees bent, feet flat, left hand on left hip, right hand above left. Pull right arm DIAGONALLY (hip to shoulder) across chest. Bring right arm along head toward floor. Thumb is pointed down when by hip and rotates to thumbs up position when by head. Hold momentarily. Slowly return to starting position. Repeat _10__ times. Do with left arm. Band color _yellow_____   Shoulder Rotation: Double Arm   On back, knees bent, feet flat, elbows tucked at sides, bent 90, hands palms up. Pull hands apart and down toward floor, keeping elbows near sides. Hold momentarily. Slowly return to starting position. Repeat _10__ times. Band color __yellow____

## 2024-09-09 NOTE — Progress Notes (Signed)
 SPIRITUAL CARE AND COUNSELING CONSULT NOTE   VISIT SUMMARY Followed up with Margaret [name] in Spiritual Care office, providing opportunity for her to share and process the challenges of facing treatment without geographically close family support. Prayer is a key coping and meaning-making tool. One day at a time is a key attitude toward treatment process. Contact from [employer] peer mentor is welcome and encouraging. She is disclosing diagnosis judiciously for support from select friends. Family members are staggering visits for support. Margaret [name] plans to phone chaplain to schedule follow-up appointment as needed/desired in conjunction with her radiation schedule.  SPIRITUAL ENCOUNTER                                                                                                                                                                      Type of Visit: Follow up Care provided to:: Patient Referral source: Chaplain assessment Reason for visit: Routine spiritual support   SPIRITUAL FRAMEWORK  Presenting Themes: Meaning/purpose/sources of inspiration, Values and beliefs, Coping tools, Courage hope and growth, Community and relationships Values/beliefs: God is present healer Community/Connection: Friend(s) (family members live far away) Strengths: Strong faith/prayer life; Sports Coach contact Needs/Challenges/Barriers: [employer] support, particularly patient connections Patient Stress Factors: Health changes, Lack of caregivers   GOALS   Self/Personal Goals: Look for healing/growth opportunities through treatment Clinical Care Goals: Continued availability for support   INTERVENTIONS   Spiritual Care Interventions Made: Compassionate presence, Reflective listening, Normalization of emotions, Narrative/life review, Explored values/beliefs/practices/strengths    INTERVENTION OUTCOMES   Outcomes: Connection to spiritual care, Awareness around  self/spiritual resourses, Connection to values and goals of care, Awareness of support, Reduced isolation  SPIRITUAL CARE PLAN   Spiritual Care Issues Still Outstanding: Chaplain will continue to follow Follow up plan : Patient plans to phone for f/u appointment around radiation schedule    Chaplain Olam Filiberto [place], [facility] Pager [phone] Voicemail [phone]

## 2024-09-09 NOTE — Therapy (Signed)
 OUTPATIENT PHYSICAL THERAPY BREAST CANCER POST OP FOLLOW UP   Patient Name: Margaret [name] MRN: [id number] DOB:09-Sep-1951, 73 y.o., female Today's Date: 09/09/2024  END OF SESSION:  PT End of Session - 09/09/24 0904     Visit Number 4    Number of Visits 10    Date for Recertification  09/29/24    PT Start Time 0903    PT Stop Time 0956    PT Time Calculation (min) 53 min    Activity Tolerance Patient tolerated treatment well    Behavior During Therapy [facility] for tasks assessed/performed          Past Medical History:  Diagnosis Date   Allergy    Anemia    Anxiety    Arthritis    CAD in native artery 11/27/2023   Cancer (HCC)    right breast 2025   Colitis    Dyspareunia    Endometriosis    Family history of breast cancer    GERD (gastroesophageal reflux disease)    High cholesterol    Hx of abnormal Pap smear 1980's   Hypertension    Osteopenia    Primary hypertension 11/27/2013   Ulcer    Urinary incontinence    Past Surgical History:  Procedure Laterality Date   ABDOMINAL HYSTERECTOMY  09/11/2008   TVH/BSO--Dr. Nikki with TVT   BILATERAL SALPINGOOPHORECTOMY     2009   BLADDER SUSPENSION  09/11/2008   Dr. Nikki   BREAST BIOPSY Right 07/09/2024   US  RT BREAST BX W LOC DEV 1ST LESION IMG BX SPEC US  GUIDE 07/09/2024 [facility]   BREAST BIOPSY  08/10/2024   US  RT RADIOACTIVE SEED LOC 08/10/2024 [facility]   BREAST LUMPECTOMY WITH RADIOACTIVE SEED AND SENTINEL LYMPH NODE BIOPSY Right 08/12/2024   Procedure: BREAST LUMPECTOMY WITH RADIOACTIVE SEED AND SENTINEL LYMPH NODE BIOPSY W/MAGTRACE;  Surgeon: Curvin Deward MOULD, MD;  Location: [facility] OR;  Service: General;  Laterality: Right;  GEN w/PEC BLOCK RIGHT BREAST RADIOACTIVE SEED LUMPECTOMY SENTINEL NODE BIOPSY   CERVIX LESION DESTRUCTION  11/11/1978   hx abnormal pap--dysplasia   COLONOSCOPY WITH PROPOFOL  Left 10/25/2017   Procedure: COLONOSCOPY WITH PROPOFOL ;  Surgeon: Saintclair Jasper, MD;  Location:  [facility];  Service: Gastroenterology;  Laterality: Left;   Patient Active Problem List   Diagnosis Date Noted   Genetic testing 08/03/2024   Family history of breast cancer    Malignant neoplasm of lower-inner quadrant of right breast of female, estrogen receptor positive (HCC) 07/16/2024   Hyperlipidemia LDL goal <70 03/09/2024   CAD in native artery 11/27/2023   Gastroesophageal reflux disease 06/06/2020   Atypical chest pain 04/21/2020   Closed nondisplaced fracture of proximal phalanx of lesser toe of left foot 11/19/2017   GI bleed 10/23/2017   Hypokalemia 10/23/2017   Nausea vomiting and diarrhea 10/23/2017   Hematochezia 10/23/2017   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 Deward Curvin  REFERRING DIAG: R breast cancer  THERAPY DIAG:  Stiffness of right shoulder, not elsewhere classified  Aftercare following surgery for neoplasm  Localized edema  Pain in right upper arm  Flank pain, left side  Abnormal posture  Malignant neoplasm of lower-inner quadrant of right breast of female, estrogen receptor positive (HCC)  Rationale for Evaluation and Treatment: Rehabilitation  ONSET DATE: 07/13/24  SUBJECTIVE:                                                                                                                                                                                           SUBJECTIVE STATEMENT: I can tell we worked my arm last time. I might have made it worse because I did some sweeping at home. I am getting that spot checked today.   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 It measures 1.1 cm and is located in the lower inner quadrant. It is ER positive, PR negative,  and HER2 negative with a Ki67 of 25%. 08/12/24- R breast lumpectomy and SLNB 0/4  PATIENT GOALS:  Reassess how my recovery is going related to arm function, pain, and swelling.  PAIN:  Are you having pain? Yes: NPRS scale: 2-3/10 Pain location: R axilla Pain description: soreness Aggravating factors: reaching with the arm Relieving factors: pt is unsure, staying still, exercises  PRECAUTIONS: Recent Surgery, right UE Lymphedema risk,   RED FLAGS: None   ACTIVITY LEVEL / LEISURE: currently doing post op exercises   OBJECTIVE:   PATIENT SURVEYS:  QUICK DASH:     OBSERVATIONS: Fibrotic area palpable in area of SLNB - feels similar to a seroma, healing lumpectomy scar with glue still intact, post op swelling visible in inferior breast  POSTURE:  Forward head and rounded shoulders posture   UPPER EXTREMITY AROM/PROM:   A/PROM RIGHT   eval   RIGHT 09/01/24  Shoulder extension 43 62  Shoulder flexion 149 148  Shoulder abduction 155 130  Shoulder internal rotation 79 69  Shoulder external rotation 79 80                          (Blank rows = not tested)   A/PROM LEFT   eval  Shoulder extension 53  Shoulder flexion 143  Shoulder abduction 161  Shoulder internal rotation 80  Shoulder external rotation 90                          (Blank rows = not tested)   CERVICAL AROM: All within functional limits   UPPER EXTREMITY STRENGTH: WFL   LYMPHEDEMA ASSESSMENTS (in cm):    LANDMARK RIGHT   eval 09/01/24  10 cm proximal to olecranon process 30.9 30.5  Olecranon process 27.4 27.8  10 cm proximal to ulnar styloid process 24.1 22  Just proximal to ulnar styloid process 16.8 16.5  Across hand at sysco  space 19.4 19.6  At base of 2nd digit 5.9 5.7  (Blank rows = not tested)   LANDMARK LEFT   eval  10 cm proximal to olecranon process 30  Olecranon process 26.9  10 cm proximal to ulnar styloid process 22.4  Just proximal to ulnar styloid process 16.1  Across  hand at thumb web space 19  At base of 2nd digit 6  (Blank rows = not tested)  Surgery type/Date: 08/12/24 R breast lumpectomy and SLNB Number of lymph nodes removed: 0/4 Current/past treatment (chemo, radiation, hormone therapy): will require radiation and hormone therapy  Other symptoms:  Heaviness/tightness Yes Pain Yes Pitting edema No Infections No Decreased scar mobility Yes Stemmer sign No  TREATMENT PERFORMED: 09/09/24: THERAPEUTIC EXERCISE: Pulleys x 2 min in direction of flexion and 2 min in direction of abduction Ball up wall x 10 reps in direction of flexion and 10 reps in direction of R shoulder abduction Instructed pt in supine scapular series and had her return demo 10 reps of each as follows with yellow band while therapist provided appropriate v/c and t/c: narrow and wide grip flexion, horizontal abduction, ER, bilateral diagonals MANUAL THERAPY: MFR to cording in R axilla with numerous cords palpable with UE in 120 degrees of abduction support on pillow with elbow bent PROM in supine in to flexion, abduction and ER to pt's tolerance  09/07/24: THERAPEUTIC EXERCISE: Pulleys x 2 min in direction of flexion and 2 min in direction of abduction Ball up wall x 10 reps in direction of flexion and 10 reps in direction of R shoulder abduction MANUAL THERAPY: MFR to cording in R axilla with numerous cords palpable with UE in 120 degrees of abduction support on pillow with elbow bent Gentle STM to area of fibrosis/seroma in R breast - educated pt to let her radiation dr know about this to be sure it will not interfere with radation  PATIENT EDUCATION:  Education details: ABC class, need for compression bra, axillary cording, seroma vs scar tissue, need for stretching for cording, side bending QL stretch Person educated: Patient Education method: Explanation, Demonstration, and Handouts Education comprehension: verbalized understanding  HOME EXERCISE PROGRAM: Reviewed  previously given post op HEP. Wear compression bra Quadratus lumborum stretch 1-2x/daily holding for 30-60 seconds  ASSESSMENT:  CLINICAL IMPRESSION: Instructed pt in supine scapular strengthening series today and issued as part of an HEP. Pt still with palpable cording and feels strong stretch at end range. She will have the area of fibrosis/seroma checked today by her doctor. This area felt slightly softer than last session. Educated pt if she has a seroma that needs to be drained to be sure to wear compression and limit repetitive activity with her RUE afterwards.   Pt will benefit from skilled therapeutic intervention to improve on the following deficits: Decreased knowledge of precautions, impaired UE functional use, pain, decreased ROM, postural dysfunction.   PT treatment/interventions: ADL/Self care home management, [phone]- PT Re-evaluation, 97110-Therapeutic exercises, 97530- Therapeutic activity, [id number]- Neuromuscular re-education, 97535- Self Care, [id number]- Manual therapy, [phone]- Orthotic Initial, [phone]- Orthotic/Prosthetic subsequent, and Patient/Family education   GOALS: Goals reviewed with patient? Yes  GOALS MET AT EVAL:  GOALS Name Target Date Goal status  1 Pt will be able to verbalize understanding of pertinent lymphedema risk reduction practices relevant to her dx specifically related to skin care.  Baseline:  No knowledge Eval Achieved at eval  2 Pt will be able to return demo and/or verbalize understanding of the post op HEP  related to regaining shoulder ROM. Baseline:  No knowledge Eval Achieved at eval  3 Pt will be able to verbalize understanding of the importance of viewing the post op After Breast CA Class video for further lymphedema risk reduction education and therapeutic exercise.  Baseline:  No knowledge Eval Achieved at eval   LONG TERM GOALS:  (STG=LTG)  GOALS Name Target Date  Goal status  1 Pt will demonstrate she has regained full shoulder ROM and  function post operatively compared to baselines.  Baseline: 09/29/24 INITIAL  2 Pt will demonstrate 155 degrees of R shoulder abduction to allow her to reach out to the side. 09/29/24 INITIAL  3 Pt will report a 75% improvement in L flank pain to allow improved comfort and mobility. 09/29/24 INITIAL  4 Pt will report 75% improvement in pain from cording with R shoulder abduction to allow improved comfort.  09/29/24 INITIAL  5 Pt will be independent in a home exercise program for continued stretching and strengthening.  09/29/24 INITIAL     PLAN:  PT FREQUENCY/DURATION: 2x/wk for 4 weeks  PLAN FOR NEXT SESSION: MLD R breast, pulleys, ball, MFR to cording in R axilla, QL stretches on L, PROM to R shoulder   [facility]  [address]  [place] [address]  [phone]  After Breast Cancer Class Video It is recommended you view the ABC class video to be educated on lymphedema risk reduction. This video lasts for about 30 minutes. It can be viewed on our website here: [email or web address]  Scar massage You can begin gentle scar massage to you incision sites. Gently place one hand on the incision and move the skin (without sliding on the skin) in various directions. Do this for a few minutes and then you can gently massage either coconut oil or vitamin E cream into the scars.  Compression garment You should continue wearing your compression bra until you feel like you no longer have swelling.  Home exercise Program Continue doing the exercises you were given until you feel like you can do them without feeling any tightness at the end.   Walking Program Studies show that 30 minutes of walking per day (fast enough to elevate your heart rate) can significantly reduce the risk of a cancer recurrence. If you can't walk due to other medical reasons, we encourage you to find another activity you  could do (like a stationary bike or water exercise).  Posture After breast cancer surgery, people frequently sit with rounded shoulders posture because it puts their incisions on slack and feels better. If you sit like this and scar tissue forms in that position, you can become very tight and have pain sitting or standing with good posture. Try to be aware of your posture and sit and stand up tall to heal properly.  Follow up PT: It is recommended you return every 3 months for the first 3 years following surgery to be assessed on the SOZO machine for an L-Dex score. This helps prevent clinically significant lymphedema in 95% of patients. These follow up screens are 10 minute appointments that you are not billed for.  [employer], PT 09/09/2024, 10:01 AM

## 2024-09-10 ENCOUNTER — Other Ambulatory Visit: Payer: Self-pay | Admitting: Gastroenterology

## 2024-09-10 DIAGNOSIS — Z1589 Genetic susceptibility to other disease: Secondary | ICD-10-CM

## 2024-09-13 ENCOUNTER — Ambulatory Visit: Admitting: Dermatology

## 2024-09-14 ENCOUNTER — Ambulatory Visit: Attending: General Surgery | Admitting: Physical Therapy

## 2024-09-14 ENCOUNTER — Encounter: Payer: Self-pay | Admitting: Physical Therapy

## 2024-09-14 DIAGNOSIS — M79621 Pain in right upper arm: Secondary | ICD-10-CM | POA: Diagnosis present

## 2024-09-14 DIAGNOSIS — R10A2 Flank pain, left side: Secondary | ICD-10-CM | POA: Diagnosis present

## 2024-09-14 DIAGNOSIS — Z483 Aftercare following surgery for neoplasm: Secondary | ICD-10-CM | POA: Insufficient documentation

## 2024-09-14 DIAGNOSIS — R293 Abnormal posture: Secondary | ICD-10-CM | POA: Insufficient documentation

## 2024-09-14 DIAGNOSIS — M25611 Stiffness of right shoulder, not elsewhere classified: Secondary | ICD-10-CM | POA: Insufficient documentation

## 2024-09-14 DIAGNOSIS — Z170 Estrogen receptor positive status [ER+]: Secondary | ICD-10-CM | POA: Diagnosis present

## 2024-09-14 DIAGNOSIS — C50311 Malignant neoplasm of lower-inner quadrant of right female breast: Secondary | ICD-10-CM | POA: Insufficient documentation

## 2024-09-14 DIAGNOSIS — R600 Localized edema: Secondary | ICD-10-CM | POA: Diagnosis present

## 2024-09-14 NOTE — Therapy (Addendum)
 OUTPATIENT PHYSICAL THERAPY BREAST CANCER POST OP FOLLOW UP   Patient Name: Margaret [name] MRN: [id number] DOB:[birth year], 73 y.o., female Today's Date: 09/14/2024  END OF SESSION:  PT End of Session - 09/14/24 1525     Visit Number 5    Number of Visits 10    Date for Recertification  09/29/24    PT Start Time 1403    PT Stop Time 1500    PT Time Calculation (min) 57 min    Activity Tolerance Patient tolerated treatment well    Behavior During Therapy [facility] for tasks assessed/performed           Past Medical History:  Diagnosis Date   Allergy    Anemia    Anxiety    Arthritis    CAD in native artery 11/27/2023   Cancer (HCC)    right breast 2025   Colitis    Dyspareunia    Endometriosis    Family history of breast cancer    GERD (gastroesophageal reflux disease)    High cholesterol    Hx of abnormal Pap smear 1980's   Hypertension    Osteopenia    Primary hypertension 11/27/2013   Ulcer    Urinary incontinence    Past Surgical History:  Procedure Laterality Date   ABDOMINAL HYSTERECTOMY  09/11/2008   TVH/BSO--Dr. Nikki with TVT   BILATERAL SALPINGOOPHORECTOMY     2009   BLADDER SUSPENSION  09/11/2008   Dr. Nikki   BREAST BIOPSY Right 07/09/2024   US  RT BREAST BX W LOC DEV 1ST LESION IMG BX SPEC US  GUIDE 07/09/2024 [facility]   BREAST BIOPSY  08/10/2024   US  RT RADIOACTIVE SEED LOC 08/10/2024 [facility]   BREAST LUMPECTOMY WITH RADIOACTIVE SEED AND SENTINEL LYMPH NODE BIOPSY Right 08/12/2024   Procedure: BREAST LUMPECTOMY WITH RADIOACTIVE SEED AND SENTINEL LYMPH NODE BIOPSY W/MAGTRACE;  Surgeon: Curvin Deward MOULD, MD;  Location: [facility] OR;  Service: General;  Laterality: Right;  GEN w/PEC BLOCK RIGHT BREAST RADIOACTIVE SEED LUMPECTOMY SENTINEL NODE BIOPSY   CERVIX LESION DESTRUCTION  11/11/1978   hx abnormal pap--dysplasia   COLONOSCOPY WITH PROPOFOL  Left 10/25/2017   Procedure: COLONOSCOPY WITH PROPOFOL ;  Surgeon: Saintclair Jasper, MD;   Location: [facility];  Service: Gastroenterology;  Laterality: Left;   Patient Active Problem List   Diagnosis Date Noted   Genetic testing 08/03/2024   Family history of breast cancer    Malignant neoplasm of lower-inner quadrant of right breast of female, estrogen receptor positive (HCC) 07/16/2024   Hyperlipidemia LDL goal <70 03/09/2024   CAD in native artery 11/27/2023   Gastroesophageal reflux disease 06/06/2020   Atypical chest pain 04/21/2020   Closed nondisplaced fracture of proximal phalanx of lesser toe of left foot 11/19/2017   GI bleed 10/23/2017   Hypokalemia 10/23/2017   Nausea vomiting and diarrhea 10/23/2017   Hematochezia 10/23/2017   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 Deward Curvin  REFERRING DIAG: R breast cancer  THERAPY DIAG:  Stiffness of right shoulder, not elsewhere classified  Aftercare following surgery for neoplasm  Localized edema  Pain in right upper arm  Flank pain, left side  Abnormal posture  Malignant neoplasm of lower-inner quadrant of right breast of female, estrogen receptor positive (HCC)  Rationale for Evaluation and Treatment: Rehabilitation  ONSET DATE: 07/13/24  SUBJECTIVE:                                                                                                                                                                                           SUBJECTIVE STATEMENT: My arm has been bothering me a little bit. When I went to do the simulation it got stuck and I had to have help getting it down.   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 It measures 1.1 cm and is located in the lower inner quadrant. It is ER positive, PR negative, and  HER2 negative with a Ki67 of 25%. 08/12/24- R breast lumpectomy and SLNB 0/4  PATIENT GOALS:  Reassess how my recovery is going related to arm function, pain, and swelling.  PAIN:  Are you having pain? Yes: NPRS scale: 2/10 Pain location: R axilla Pain description: soreness Aggravating factors: reaching with the arm Relieving factors: pt is unsure, staying still, exercises  PRECAUTIONS: Recent Surgery, right UE Lymphedema risk,   RED FLAGS: None   ACTIVITY LEVEL / LEISURE: currently doing post op exercises   OBJECTIVE:   PATIENT SURVEYS:  QUICK DASH:     OBSERVATIONS: Fibrotic area palpable in area of SLNB - feels similar to a seroma, healing lumpectomy scar with glue still intact, post op swelling visible in inferior breast  POSTURE:  Forward head and rounded shoulders posture   UPPER EXTREMITY AROM/PROM:   A/PROM RIGHT   eval   RIGHT 09/01/24  Shoulder extension 43 62  Shoulder flexion 149 148  Shoulder abduction 155 130  Shoulder internal rotation 79 69  Shoulder external rotation 79 80                          (Blank rows = not tested)   A/PROM LEFT   eval  Shoulder extension 53  Shoulder flexion 143  Shoulder abduction 161  Shoulder internal rotation 80  Shoulder external rotation 90                          (Blank rows = not tested)   CERVICAL AROM: All within functional limits   UPPER EXTREMITY STRENGTH: WFL   LYMPHEDEMA ASSESSMENTS (in cm):    LANDMARK RIGHT   eval 09/01/24  10 cm proximal to olecranon process 30.9 30.5  Olecranon process 27.4 27.8  10 cm proximal to ulnar styloid process 24.1 22  Just proximal to ulnar styloid process 16.8 16.5  Across hand at thumb web space  19.4 19.6  At base of 2nd digit 5.9 5.7  (Blank rows = not tested)   LANDMARK LEFT   eval  10 cm proximal to olecranon process 30  Olecranon process 26.9  10 cm proximal to ulnar styloid process 22.4  Just proximal to ulnar styloid process 16.1  Across hand at  thumb web space 19  At base of 2nd digit 6  (Blank rows = not tested)  Surgery type/Date: 08/12/24 R breast lumpectomy and SLNB Number of lymph nodes removed: 0/4 Current/past treatment (chemo, radiation, hormone therapy): will require radiation and hormone therapy  Other symptoms:  Heaviness/tightness Yes Pain Yes Pitting edema No Infections No Decreased scar mobility Yes Stemmer sign No  TREATMENT PERFORMED: 09/14/24: THERAPEUTIC EXERCISE: Pulleys x 2 min in direction of flexion and 2 min in direction of abduction Ball up wall x 10 reps in direction of flexion and 10 reps in direction of R shoulder abduction THERAPEUTIC ACTIVITY: Supine over 1/2 foam roll: alternating flexion x 10, bilateral scaption x 10, snow angels (very limited motion due to tightness) Supine over 1/2 foam roll: supine scapular series and had her return demo 10 reps of each as follows with yellow band while therapist provided appropriate v/c and t/c: narrow and wide grip flexion, horizontal abduction, ER, bilateral diagonals MANUAL THERAPY: MFR to cording in R axilla with numerous cords palpable with UE in 90 degrees of abduction PROM in supine in to flexion, abduction and ER to pt's tolerance with v/c and t/c to keep shoulder relaxed Gentle STM to seroma in inferior breast as well as MLD moving fluid towards pathway aimed at right lateral trunk towards groin  09/09/24: THERAPEUTIC EXERCISE: Pulleys x 2 min in direction of flexion and 2 min in direction of abduction Ball up wall x 10 reps in direction of flexion and 10 reps in direction of R shoulder abduction Instructed pt in supine scapular series and had her return demo 10 reps of each as follows with yellow band while therapist provided appropriate v/c and t/c: narrow and wide grip flexion, horizontal abduction, ER, bilateral diagonals MANUAL THERAPY: MFR to cording in R axilla with numerous cords palpable with UE in 120 degrees of abduction support on  pillow with elbow bent PROM in supine in to flexion, abduction and ER to pt's tolerance  09/07/24: THERAPEUTIC EXERCISE: Pulleys x 2 min in direction of flexion and 2 min in direction of abduction Ball up wall x 10 reps in direction of flexion and 10 reps in direction of R shoulder abduction MANUAL THERAPY: MFR to cording in R axilla with numerous cords palpable with UE in 120 degrees of abduction support on pillow with elbow bent Gentle STM to area of fibrosis/seroma in R breast - educated pt to let her radiation dr know about this to be sure it will not interfere with radation  PATIENT EDUCATION:  Education details: ABC class, need for compression bra, axillary cording, seroma vs scar tissue, need for stretching for cording, side bending QL stretch Person educated: Patient Education method: Explanation, Demonstration, and Handouts Education comprehension: verbalized understanding  HOME EXERCISE PROGRAM: Reviewed previously given post op HEP. Wear compression bra Quadratus lumborum stretch 1-2x/daily holding for 30-60 seconds  ASSESSMENT:  CLINICAL IMPRESSION: Instructed pt in exercises on 1/2 foam roll today to help improve posture and work on strengthening scapular muscles to allow improved posture and ROM. Continued to work on cording in R axilla with at least 1 cord palpable from axilla to antecubital fossa. Her  seroma has softened since last session so continued with gentle STM and MLD to this area.   Pt will benefit from skilled therapeutic intervention to improve on the following deficits: Decreased knowledge of precautions, impaired UE functional use, pain, decreased ROM, postural dysfunction.   PT treatment/interventions: ADL/Self care home management, [phone]- PT Re-evaluation, 97110-Therapeutic exercises, 97530- Therapeutic activity, [id number]- Neuromuscular re-education, 97535- Self Care, [id number]- Manual therapy, [phone]- Orthotic Initial, [phone]- Orthotic/Prosthetic subsequent, and  Patient/Family education   GOALS: Goals reviewed with patient? Yes  GOALS MET AT EVAL:  GOALS Name Target Date Goal status  1 Pt will be able to verbalize understanding of pertinent lymphedema risk reduction practices relevant to her dx specifically related to skin care.  Baseline:  No knowledge Eval Achieved at eval  2 Pt will be able to return demo and/or verbalize understanding of the post op HEP related to regaining shoulder ROM. Baseline:  No knowledge Eval Achieved at eval  3 Pt will be able to verbalize understanding of the importance of viewing the post op After Breast CA Class video for further lymphedema risk reduction education and therapeutic exercise.  Baseline:  No knowledge Eval Achieved at eval   LONG TERM GOALS:  (STG=LTG)  GOALS Name Target Date  Goal status  1 Pt will demonstrate she has regained full shoulder ROM and function post operatively compared to baselines.  Baseline: 09/29/24 INITIAL  2 Pt will demonstrate 155 degrees of R shoulder abduction to allow her to reach out to the side. 09/29/24 INITIAL  3 Pt will report a 75% improvement in L flank pain to allow improved comfort and mobility. 09/29/24 INITIAL  4 Pt will report 75% improvement in pain from cording with R shoulder abduction to allow improved comfort.  09/29/24 INITIAL  5 Pt will be independent in a home exercise program for continued stretching and strengthening.  09/29/24 INITIAL     PLAN:  PT FREQUENCY/DURATION: 2x/wk for 4 weeks  PLAN FOR NEXT SESSION: MLD R breast, pulleys, ball, MFR to cording in R axilla, QL stretches on L, PROM to R shoulder   [facility]  [address]  [place] [address]  [phone]  After Breast Cancer Class Video It is recommended you view the ABC class video to be educated on lymphedema risk reduction. This video lasts for about 30 minutes. It can be viewed on our website here:  [email or web address]  Scar massage You can begin gentle scar massage to you incision sites. Gently place one hand on the incision and move the skin (without sliding on the skin) in various directions. Do this for a few minutes and then you can gently massage either coconut oil or vitamin E cream into the scars.  Compression garment You should continue wearing your compression bra until you feel like you no longer have swelling.  Home exercise Program Continue doing the exercises you were given until you feel like you can do them without feeling any tightness at the end.   Walking Program Studies show that 30 minutes of walking per day (fast enough to elevate your heart rate) can significantly reduce the risk of a cancer recurrence. If you can't walk due to other medical reasons, we encourage you to find another activity you could do (like a stationary bike or water exercise).  Posture After breast cancer surgery, people frequently sit with rounded shoulders posture because it puts their incisions on slack and feels better. If you sit like this and scar tissue  forms in that position, you can become very tight and have pain sitting or standing with good posture. Try to be aware of your posture and sit and stand up tall to heal properly.  Follow up PT: It is recommended you return every 3 months for the first 3 years following surgery to be assessed on the SOZO machine for an L-Dex score. This helps prevent clinically significant lymphedema in 95% of patients. These follow up screens are 10 minute appointments that you are not billed for.  Florina Sever [place]rose, PT 09/14/2024, 3:31 PM

## 2024-09-15 ENCOUNTER — Telehealth: Payer: Self-pay | Admitting: *Deleted

## 2024-09-15 ENCOUNTER — Encounter: Payer: Self-pay | Admitting: *Deleted

## 2024-09-15 DIAGNOSIS — C50311 Malignant neoplasm of lower-inner quadrant of right female breast: Secondary | ICD-10-CM

## 2024-09-15 NOTE — Telephone Encounter (Signed)
 Received call from pt requesting advice from MD if okay to proceed with MRI abdomen MRCP W WO contrast while undergoing radiation therapy.  Per MD okay to proceed.  Pt educated and verbalized understanding.

## 2024-09-16 ENCOUNTER — Encounter: Payer: Self-pay | Admitting: Physical Therapy

## 2024-09-16 ENCOUNTER — Ambulatory Visit: Admitting: Physical Therapy

## 2024-09-16 DIAGNOSIS — Z170 Estrogen receptor positive status [ER+]: Secondary | ICD-10-CM | POA: Diagnosis present

## 2024-09-16 DIAGNOSIS — R293 Abnormal posture: Secondary | ICD-10-CM

## 2024-09-16 DIAGNOSIS — M25611 Stiffness of right shoulder, not elsewhere classified: Secondary | ICD-10-CM | POA: Diagnosis not present

## 2024-09-16 DIAGNOSIS — Z483 Aftercare following surgery for neoplasm: Secondary | ICD-10-CM

## 2024-09-16 DIAGNOSIS — R52 Pain, unspecified: Secondary | ICD-10-CM | POA: Diagnosis present

## 2024-09-16 DIAGNOSIS — R600 Localized edema: Secondary | ICD-10-CM

## 2024-09-16 DIAGNOSIS — C50311 Malignant neoplasm of lower-inner quadrant of right female breast: Secondary | ICD-10-CM | POA: Insufficient documentation

## 2024-09-16 DIAGNOSIS — R10A2 Flank pain, left side: Secondary | ICD-10-CM

## 2024-09-16 DIAGNOSIS — M79621 Pain in right upper arm: Secondary | ICD-10-CM

## 2024-09-16 NOTE — Therapy (Addendum)
 OUTPATIENT PHYSICAL THERAPY BREAST CANCER POST OP FOLLOW UP   Patient Name: Margaret [name] MRN: [id number] DOB:[birth year], 73 y.o., female Today's Date: 09/16/2024  END OF SESSION:  PT End of Session - 09/16/24 1331     Visit Number 6    Number of Visits 10    Date for Recertification  09/29/24    PT Start Time 1302    PT Stop Time 1358    PT Time Calculation (min) 56 min    Activity Tolerance Patient tolerated treatment well    Behavior During Therapy [facility] for tasks assessed/performed           Past Medical History:  Diagnosis Date   Allergy    Anemia    Anxiety    Arthritis    CAD in native artery 11/27/2023   Cancer (HCC)    right breast 2025   Colitis    Dyspareunia    Endometriosis    Family history of breast cancer    GERD (gastroesophageal reflux disease)    High cholesterol    Hx of abnormal Pap smear 1980's   Hypertension    Osteopenia    Primary hypertension 11/27/2013   Ulcer    Urinary incontinence    Past Surgical History:  Procedure Laterality Date   ABDOMINAL HYSTERECTOMY  09/11/2008   TVH/BSO--Dr. Nikki with TVT   BILATERAL SALPINGOOPHORECTOMY     2009   BLADDER SUSPENSION  09/11/2008   Dr. Nikki   BREAST BIOPSY Right 07/09/2024   US  RT BREAST BX W LOC DEV 1ST LESION IMG BX SPEC US  GUIDE 07/09/2024 [facility]   BREAST BIOPSY  08/10/2024   US  RT RADIOACTIVE SEED LOC 08/10/2024 [facility]   BREAST LUMPECTOMY WITH RADIOACTIVE SEED AND SENTINEL LYMPH NODE BIOPSY Right 08/12/2024   Procedure: BREAST LUMPECTOMY WITH RADIOACTIVE SEED AND SENTINEL LYMPH NODE BIOPSY W/MAGTRACE;  Surgeon: Curvin Deward MOULD, MD;  Location: [facility] OR;  Service: General;  Laterality: Right;  GEN w/PEC BLOCK RIGHT BREAST RADIOACTIVE SEED LUMPECTOMY SENTINEL NODE BIOPSY   CERVIX LESION DESTRUCTION  11/11/1978   hx abnormal pap--dysplasia   COLONOSCOPY WITH PROPOFOL  Left 10/25/2017   Procedure: COLONOSCOPY WITH PROPOFOL ;  Surgeon: Saintclair Jasper, MD;   Location: [facility];  Service: Gastroenterology;  Laterality: Left;   Patient Active Problem List   Diagnosis Date Noted   Genetic testing 08/03/2024   Family history of breast cancer    Malignant neoplasm of lower-inner quadrant of right breast of female, estrogen receptor positive (HCC) 07/16/2024   Hyperlipidemia LDL goal <70 03/09/2024   CAD in native artery 11/27/2023   Gastroesophageal reflux disease 06/06/2020   Atypical chest pain 04/21/2020   Closed nondisplaced fracture of proximal phalanx of lesser toe of left foot 11/19/2017   GI bleed 10/23/2017   Hypokalemia 10/23/2017   Nausea vomiting and diarrhea 10/23/2017   Hematochezia 10/23/2017   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 Deward Curvin  REFERRING DIAG: R breast cancer  THERAPY DIAG:  Stiffness of right shoulder, not elsewhere classified  Aftercare following surgery for neoplasm  Localized edema  Pain in right upper arm  Flank pain, left side  Abnormal posture  Malignant neoplasm of lower-inner quadrant of right breast of female, estrogen receptor positive (HCC)  Rationale for Evaluation and Treatment: Rehabilitation  ONSET DATE: 07/13/24  SUBJECTIVE:                                                                                                                                                                                           SUBJECTIVE STATEMENT: I was a little sore after last visit. My hands have really been hurting.   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 It measures 1.1 cm and is located in the lower inner quadrant. It is ER positive, PR negative, and HER2 negative with a Ki67 of 25%. 08/12/24- R breast  lumpectomy and SLNB 0/4  PATIENT GOALS:  Reassess how my recovery is going related to arm function, pain, and swelling.  PAIN:  Are you having pain? Yes: NPRS scale: 3/10 Pain location: R axilla Pain description: soreness Aggravating factors: reaching with the arm Relieving factors: pt is unsure, staying still, exercises  PRECAUTIONS: Recent Surgery, right UE Lymphedema risk,   RED FLAGS: None   ACTIVITY LEVEL / LEISURE: currently doing post op exercises   OBJECTIVE:   PATIENT SURVEYS:  QUICK DASH:     OBSERVATIONS: Fibrotic area palpable in area of SLNB - feels similar to a seroma, healing lumpectomy scar with glue still intact, post op swelling visible in inferior breast  POSTURE:  Forward head and rounded shoulders posture   UPPER EXTREMITY AROM/PROM:   A/PROM RIGHT   eval   RIGHT 09/01/24 RIGHT 09/16/24  Shoulder extension 43 62   Shoulder flexion 149 148 162  Shoulder abduction 155 130 155  Shoulder internal rotation 79 69 66  Shoulder external rotation 79 80                           (Blank rows = not tested)   A/PROM LEFT   eval  Shoulder extension 53  Shoulder flexion 143  Shoulder abduction 161  Shoulder internal rotation 80  Shoulder external rotation 90                          (Blank rows = not tested)   CERVICAL AROM: All within functional limits   UPPER EXTREMITY STRENGTH: WFL   LYMPHEDEMA ASSESSMENTS (in cm):    LANDMARK RIGHT   eval 09/01/24  10 cm proximal to olecranon process 30.9 30.5  Olecranon process 27.4 27.8  10 cm proximal to ulnar styloid process 24.1 22  Just proximal to ulnar styloid process 16.8 16.5  Across hand at thumb web space 19.4 19.6  At base of 2nd  digit 5.9 5.7  (Blank rows = not tested)   LANDMARK LEFT   eval  10 cm proximal to olecranon process 30  Olecranon process 26.9  10 cm proximal to ulnar styloid process 22.4  Just proximal to ulnar styloid process 16.1  Across hand at thumb web space 19  At  base of 2nd digit 6  (Blank rows = not tested)  Surgery type/Date: 08/12/24 R breast lumpectomy and SLNB Number of lymph nodes removed: 0/4 Current/past treatment (chemo, radiation, hormone therapy): will require radiation and hormone therapy  Other symptoms:  Heaviness/tightness Yes Pain Yes Pitting edema No Infections No Decreased scar mobility Yes Stemmer sign No  TREATMENT PERFORMED: 09/16/24: THERAPEUTIC EXERCISE: Pulleys x 2 min in direction of flexion and 2 min in direction of abduction Ball up wall x 10 reps in direction of flexion and 10 reps in direction of R shoulder abduction THERAPEUTIC ACTIVITY: Supine over 1/2 foam roll: alternating flexion x 10, bilateral scaption x 10, snow angels (very limited motion due to tightness) Supine over 1/2 foam roll: supine scapular series and had her return demo 10 reps of each as follows with yellow band while therapist provided appropriate v/c and t/c: narrow and wide grip flexion, horizontal abduction, ER, bilateral diagonals MANUAL THERAPY: MFR to cording in R axilla and upper arm with numerous cords palpable with UE in 90 degrees with cording extending past antecubital fossa PROM in supine in to flexion and abduction  09/14/24: THERAPEUTIC EXERCISE: Pulleys x 2 min in direction of flexion and 2 min in direction of abduction Ball up wall x 10 reps in direction of flexion and 10 reps in direction of R shoulder abduction THERAPEUTIC ACTIVITY: Supine over 1/2 foam roll: alternating flexion x 10, bilateral scaption x 10, snow angels (very limited motion due to tightness) Supine over 1/2 foam roll: supine scapular series and had her return demo 10 reps of each as follows with yellow band while therapist provided appropriate v/c and t/c: narrow and wide grip flexion, horizontal abduction, ER, bilateral diagonals MANUAL THERAPY: MFR to cording in R axilla with numerous cords palpable with UE in 90 degrees of abduction PROM in supine in to  flexion, abduction and ER to pt's tolerance with v/c and t/c to keep shoulder relaxed Gentle STM to seroma in inferior breast as well as MLD moving fluid towards pathway aimed at right lateral trunk towards groin    09/09/24: THERAPEUTIC EXERCISE: Pulleys x 2 min in direction of flexion and 2 min in direction of abduction Ball up wall x 10 reps in direction of flexion and 10 reps in direction of R shoulder abduction Instructed pt in supine scapular series and had her return demo 10 reps of each as follows with yellow band while therapist provided appropriate v/c and t/c: narrow and wide grip flexion, horizontal abduction, ER, bilateral diagonals MANUAL THERAPY: MFR to cording in R axilla with numerous cords palpable with UE in 120 degrees of abduction support on pillow with elbow bent PROM in supine in to flexion, abduction and ER to pt's tolerance  09/07/24: THERAPEUTIC EXERCISE: Pulleys x 2 min in direction of flexion and 2 min in direction of abduction Ball up wall x 10 reps in direction of flexion and 10 reps in direction of R shoulder abduction MANUAL THERAPY: MFR to cording in R axilla with numerous cords palpable with UE in 120 degrees of abduction support on pillow with elbow bent Gentle STM to area of fibrosis/seroma in R breast - educated  pt to let her radiation dr know about this to be sure it will not interfere with radation  PATIENT EDUCATION:  Education details: ABC class, need for compression bra, axillary cording, seroma vs scar tissue, need for stretching for cording, side bending QL stretch Person educated: Patient Education method: Explanation, Demonstration, and Handouts Education comprehension: verbalized understanding  HOME EXERCISE PROGRAM: Reviewed previously given post op HEP. Wear compression bra Quadratus lumborum stretch 1-2x/daily holding for 30-60 seconds  ASSESSMENT:  CLINICAL IMPRESSION: Continued exercises on 1/2 foam roller to improve posture and  decrease cording. Pt requires v/c and t/c to keep shoulder relaxed during manual therapy. She tends to guard initially. Pt still has numerous cords palpable that are interfering with her ROM and decreasing comfort. Focused on MFR to help decrease cording.   Pt will benefit from skilled therapeutic intervention to improve on the following deficits: Decreased knowledge of precautions, impaired UE functional use, pain, decreased ROM, postural dysfunction.   PT treatment/interventions: ADL/Self care home management, [phone]- PT Re-evaluation, 97110-Therapeutic exercises, 97530- Therapeutic activity, [id number]- Neuromuscular re-education, 97535- Self Care, [id number]- Manual therapy, [phone]- Orthotic Initial, [phone]- Orthotic/Prosthetic subsequent, and Patient/Family education   GOALS: Goals reviewed with patient? Yes  GOALS MET AT EVAL:  GOALS Name Target Date Goal status  1 Pt will be able to verbalize understanding of pertinent lymphedema risk reduction practices relevant to her dx specifically related to skin care.  Baseline:  No knowledge Eval Achieved at eval  2 Pt will be able to return demo and/or verbalize understanding of the post op HEP related to regaining shoulder ROM. Baseline:  No knowledge Eval Achieved at eval  3 Pt will be able to verbalize understanding of the importance of viewing the post op After Breast CA Class video for further lymphedema risk reduction education and therapeutic exercise.  Baseline:  No knowledge Eval Achieved at eval   LONG TERM GOALS:  (STG=LTG)  GOALS Name Target Date  Goal status  1 Pt will demonstrate she has regained full shoulder ROM and function post operatively compared to baselines.  Baseline: 09/29/24 INITIAL  2 Pt will demonstrate 155 degrees of R shoulder abduction to allow her to reach out to the side. 09/29/24 INITIAL  3 Pt will report a 75% improvement in L flank pain to allow improved comfort and mobility. 09/29/24 INITIAL  4 Pt will report 75%  improvement in pain from cording with R shoulder abduction to allow improved comfort.  09/29/24 INITIAL  5 Pt will be independent in a home exercise program for continued stretching and strengthening.  09/29/24 INITIAL     PLAN:  PT FREQUENCY/DURATION: 2x/wk for 4 weeks  PLAN FOR NEXT SESSION: MLD R breast, pulleys, ball, MFR to cording in R axilla, QL stretches on L, PROM to R shoulder   [facility]  [address]  [place] [address]  [phone]  After Breast Cancer Class Video It is recommended you view the ABC class video to be educated on lymphedema risk reduction. This video lasts for about 30 minutes. It can be viewed on our website here: [email or web address]  Scar massage You can begin gentle scar massage to you incision sites. Gently place one hand on the incision and move the skin (without sliding on the skin) in various directions. Do this for a few minutes and then you can gently massage either coconut oil or vitamin E cream into the scars.  Compression garment You should continue wearing your compression bra until you  feel like you no longer have swelling.  Home exercise Program Continue doing the exercises you were given until you feel like you can do them without feeling any tightness at the end.   Walking Program Studies show that 30 minutes of walking per day (fast enough to elevate your heart rate) can significantly reduce the risk of a cancer recurrence. If you can't walk due to other medical reasons, we encourage you to find another activity you could do (like a stationary bike or water exercise).  Posture After breast cancer surgery, people frequently sit with rounded shoulders posture because it puts their incisions on slack and feels better. If you sit like this and scar tissue forms in that position, you can become very tight and have pain sitting or standing  with good posture. Try to be aware of your posture and sit and stand up tall to heal properly.  Follow up PT: It is recommended you return every 3 months for the first 3 years following surgery to be assessed on the SOZO machine for an L-Dex score. This helps prevent clinically significant lymphedema in 95% of patients. These follow up screens are 10 minute appointments that you are not billed for.  [facility] [place], PT 09/16/2024, 2:03 PM

## 2024-09-20 ENCOUNTER — Ambulatory Visit: Admitting: Physical Therapy

## 2024-09-20 ENCOUNTER — Encounter: Payer: Self-pay | Admitting: Physical Therapy

## 2024-09-20 DIAGNOSIS — R600 Localized edema: Secondary | ICD-10-CM

## 2024-09-20 DIAGNOSIS — Z483 Aftercare following surgery for neoplasm: Secondary | ICD-10-CM

## 2024-09-20 DIAGNOSIS — M79621 Pain in right upper arm: Secondary | ICD-10-CM

## 2024-09-20 DIAGNOSIS — M25611 Stiffness of right shoulder, not elsewhere classified: Secondary | ICD-10-CM | POA: Diagnosis not present

## 2024-09-20 DIAGNOSIS — R10A2 Flank pain, left side: Secondary | ICD-10-CM

## 2024-09-20 DIAGNOSIS — R293 Abnormal posture: Secondary | ICD-10-CM

## 2024-09-20 DIAGNOSIS — C50311 Malignant neoplasm of lower-inner quadrant of right female breast: Secondary | ICD-10-CM

## 2024-09-20 NOTE — Therapy (Signed)
 OUTPATIENT PHYSICAL THERAPY BREAST CANCER POST OP FOLLOW UP   Patient Name: Margaret [name] MRN: [id number] DOB:February 27, 1951, 73 y.o., female Today's Date: 09/20/2024  END OF SESSION:  PT End of Session - 09/20/24 1103     Visit Number 7    Number of Visits 10    Date for Recertification  09/29/24    PT Start Time 1102    PT Stop Time 1156    PT Time Calculation (min) 54 min    Activity Tolerance Patient tolerated treatment well    Behavior During Therapy [facility] for tasks assessed/performed           Past Medical History:  Diagnosis Date   Allergy    Anemia    Anxiety    Arthritis    CAD in native artery 11/27/2023   Cancer (HCC)    right breast 2025   Colitis    Dyspareunia    Endometriosis    Family history of breast cancer    GERD (gastroesophageal reflux disease)    High cholesterol    Hx of abnormal Pap smear 1980's   Hypertension    Osteopenia    Primary hypertension 11/27/2013   Ulcer    Urinary incontinence    Past Surgical History:  Procedure Laterality Date   ABDOMINAL HYSTERECTOMY  09/11/2008   TVH/BSO--Dr. Nikki with TVT   BILATERAL SALPINGOOPHORECTOMY     2009   BLADDER SUSPENSION  09/11/2008   Dr. Nikki   BREAST BIOPSY Right 07/09/2024   US  RT BREAST BX W LOC DEV 1ST LESION IMG BX SPEC US  GUIDE 07/09/2024 [facility]   BREAST BIOPSY  08/10/2024   US  RT RADIOACTIVE SEED LOC 08/10/2024 [facility]   BREAST LUMPECTOMY WITH RADIOACTIVE SEED AND SENTINEL LYMPH NODE BIOPSY Right 08/12/2024   Procedure: BREAST LUMPECTOMY WITH RADIOACTIVE SEED AND SENTINEL LYMPH NODE BIOPSY W/MAGTRACE;  Surgeon: Curvin Deward MOULD, MD;  Location: [facility] OR;  Service: General;  Laterality: Right;  GEN w/PEC BLOCK RIGHT BREAST RADIOACTIVE SEED LUMPECTOMY SENTINEL NODE BIOPSY   CERVIX LESION DESTRUCTION  11/11/1978   hx abnormal pap--dysplasia   COLONOSCOPY WITH PROPOFOL  Left 10/25/2017   Procedure: COLONOSCOPY WITH PROPOFOL ;  Surgeon: Saintclair Jasper, MD;   Location: [facility];  Service: Gastroenterology;  Laterality: Left;   Patient Active Problem List   Diagnosis Date Noted   Genetic testing 08/03/2024   Family history of breast cancer    Malignant neoplasm of lower-inner quadrant of right breast of female, estrogen receptor positive (HCC) 07/16/2024   Hyperlipidemia LDL goal <70 03/09/2024   CAD in native artery 11/27/2023   Gastroesophageal reflux disease 06/06/2020   Atypical chest pain 04/21/2020   Closed nondisplaced fracture of proximal phalanx of lesser toe of left foot 11/19/2017   GI bleed 10/23/2017   Hypokalemia 10/23/2017   Nausea vomiting and diarrhea 10/23/2017   Hematochezia 10/23/2017   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 Deward Curvin  REFERRING DIAG: R breast cancer  THERAPY DIAG:  Stiffness of right shoulder, not elsewhere classified  Aftercare following surgery for neoplasm  Localized edema  Pain in right upper arm  Flank pain, left side  Abnormal posture  Malignant neoplasm of lower-inner quadrant of right breast of female, estrogen receptor positive (HCC)  Rationale for Evaluation and Treatment: Rehabilitation  ONSET DATE: 07/13/24  SUBJECTIVE:                                                                                                                                                                                           SUBJECTIVE STATEMENT: My arm was sore after last time. I could feel the cording. I have to admit I worked in the yard. 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 It measures 1.1 cm and is located in the lower inner quadrant. It is  ER positive, PR negative, and HER2 negative with a Ki67 of 25%. 08/12/24- R breast lumpectomy and SLNB 0/4  PATIENT GOALS:  Reassess how my recovery is going related to arm function, pain, and swelling.  PAIN:  Are you having pain? Yes: NPRS scale: 2/10 Pain location: R axilla Pain description: soreness Aggravating factors: reaching with the arm Relieving factors: pt is unsure, staying still, exercises  PRECAUTIONS: Recent Surgery, right UE Lymphedema risk,   RED FLAGS: None   ACTIVITY LEVEL / LEISURE: currently doing post op exercises   OBJECTIVE:   PATIENT SURVEYS:  QUICK DASH:     OBSERVATIONS: Fibrotic area palpable in area of SLNB - feels similar to a seroma, healing lumpectomy scar with glue still intact, post op swelling visible in inferior breast  POSTURE:  Forward head and rounded shoulders posture   UPPER EXTREMITY AROM/PROM:   A/PROM RIGHT   eval   RIGHT 09/01/24 RIGHT 09/16/24  Shoulder extension 43 62   Shoulder flexion 149 148 162  Shoulder abduction 155 130 155  Shoulder internal rotation 79 69 66  Shoulder external rotation 79 80                           (Blank rows = not tested)   A/PROM LEFT   eval  Shoulder extension 53  Shoulder flexion 143  Shoulder abduction 161  Shoulder internal rotation 80  Shoulder external rotation 90                          (Blank rows = not tested)   CERVICAL AROM: All within functional limits   UPPER EXTREMITY STRENGTH: WFL   LYMPHEDEMA ASSESSMENTS (in cm):    LANDMARK RIGHT   eval 09/01/24  10 cm proximal to olecranon process 30.9 30.5  Olecranon process 27.4 27.8  10 cm proximal to ulnar styloid process 24.1 22  Just  proximal to ulnar styloid process 16.8 16.5  Across hand at thumb web space 19.4 19.6  At base of 2nd digit 5.9 5.7  (Blank rows = not tested)   LANDMARK LEFT   eval  10 cm proximal to olecranon process 30  Olecranon process 26.9  10 cm proximal to ulnar styloid process 22.4   Just proximal to ulnar styloid process 16.1  Across hand at thumb web space 19  At base of 2nd digit 6  (Blank rows = not tested)  Surgery type/Date: 08/12/24 R breast lumpectomy and SLNB Number of lymph nodes removed: 0/4 Current/past treatment (chemo, radiation, hormone therapy): will require radiation and hormone therapy  Other symptoms:  Heaviness/tightness Yes Pain Yes Pitting edema No Infections No Decreased scar mobility Yes Stemmer sign No  TREATMENT PERFORMED: 09/20/24: THERAPEUTIC EXERCISE: Pulleys x 2 min in direction of flexion and 2 min in direction of abduction Ball up wall x 10 reps in direction of flexion and 10 reps in direction of R shoulder abduction MANUAL THERAPY: MFR to cording in R axilla and upper arm with numerous cords palpable with UE in 90 degrees of abduction propped on pillows PROM in supine in to flexion and abduction Gentle STM to seroma in inferior breast as well as MLD moving fluid towards pathway aimed at right lateral trunk towards groin  09/16/24: THERAPEUTIC EXERCISE: Pulleys x 2 min in direction of flexion and 2 min in direction of abduction Ball up wall x 10 reps in direction of flexion and 10 reps in direction of R shoulder abduction THERAPEUTIC ACTIVITY: Supine over 1/2 foam roll: alternating flexion x 10, bilateral scaption x 10, snow angels (very limited motion due to tightness) Supine over 1/2 foam roll: supine scapular series and had her return demo 10 reps of each as follows with yellow band while therapist provided appropriate v/c and t/c: narrow and wide grip flexion, horizontal abduction, ER, bilateral diagonals MANUAL THERAPY: MFR to cording in R axilla and upper arm with numerous cords palpable with UE in 90 degrees with cording extending past antecubital fossa PROM in supine in to flexion and abduction  09/14/24: THERAPEUTIC EXERCISE: Pulleys x 2 min in direction of flexion and 2 min in direction of abduction Ball up wall x  10 reps in direction of flexion and 10 reps in direction of R shoulder abduction THERAPEUTIC ACTIVITY: Supine over 1/2 foam roll: alternating flexion x 10, bilateral scaption x 10, snow angels (very limited motion due to tightness) Supine over 1/2 foam roll: supine scapular series and had her return demo 10 reps of each as follows with yellow band while therapist provided appropriate v/c and t/c: narrow and wide grip flexion, horizontal abduction, ER, bilateral diagonals MANUAL THERAPY: MFR to cording in R axilla with numerous cords palpable with UE in 90 degrees of abduction PROM in supine in to flexion, abduction and ER to pt's tolerance with v/c and t/c to keep shoulder relaxed Gentle STM to seroma in inferior breast as well as MLD moving fluid towards pathway aimed at right lateral trunk towards groin  09/09/24: THERAPEUTIC EXERCISE: Pulleys x 2 min in direction of flexion and 2 min in direction of abduction Ball up wall x 10 reps in direction of flexion and 10 reps in direction of R shoulder abduction Instructed pt in supine scapular series and had her return demo 10 reps of each as follows with yellow band while therapist provided appropriate v/c and t/c: narrow and wide grip flexion, horizontal abduction, ER, bilateral  diagonals MANUAL THERAPY: MFR to cording in R axilla with numerous cords palpable with UE in 120 degrees of abduction support on pillow with elbow bent PROM in supine in to flexion, abduction and ER to pt's tolerance  09/07/24: THERAPEUTIC EXERCISE: Pulleys x 2 min in direction of flexion and 2 min in direction of abduction Ball up wall x 10 reps in direction of flexion and 10 reps in direction of R shoulder abduction MANUAL THERAPY: MFR to cording in R axilla with numerous cords palpable with UE in 120 degrees of abduction support on pillow with elbow bent Gentle STM to area of fibrosis/seroma in R breast - educated pt to let her radiation dr know about this to be sure  it will not interfere with radation  PATIENT EDUCATION:  Education details: ABC class, need for compression bra, axillary cording, seroma vs scar tissue, need for stretching for cording, side bending QL stretch Person educated: Patient Education method: Explanation, Demonstration, and Handouts Education comprehension: verbalized understanding  HOME EXERCISE PROGRAM: Reviewed previously given post op HEP. Wear compression bra Quadratus lumborum stretch 1-2x/daily holding for 30-60 seconds  ASSESSMENT:  CLINICAL IMPRESSION: Did not do the foam roller today because pt had back discomfort after that which may have been from the foam roller or the yard work she did. Focused today on decreasing cording which had worsened since the last session and also on the area of scar tissue in inferior R breast. Her scar tissue is improving with time and becoming smaller.   Pt will benefit from skilled therapeutic intervention to improve on the following deficits: Decreased knowledge of precautions, impaired UE functional use, pain, decreased ROM, postural dysfunction.   PT treatment/interventions: ADL/Self care home management, [phone]- PT Re-evaluation, 97110-Therapeutic exercises, 97530- Therapeutic activity, [id number]- Neuromuscular re-education, 97535- Self Care, [id number]- Manual therapy, [phone]- Orthotic Initial, [phone]- Orthotic/Prosthetic subsequent, and Patient/Family education   GOALS: Goals reviewed with patient? Yes  GOALS MET AT EVAL:  GOALS Name Target Date Goal status  1 Pt will be able to verbalize understanding of pertinent lymphedema risk reduction practices relevant to her dx specifically related to skin care.  Baseline:  No knowledge Eval Achieved at eval  2 Pt will be able to return demo and/or verbalize understanding of the post op HEP related to regaining shoulder ROM. Baseline:  No knowledge Eval Achieved at eval  3 Pt will be able to verbalize understanding of the importance of viewing  the post op After Breast CA Class video for further lymphedema risk reduction education and therapeutic exercise.  Baseline:  No knowledge Eval Achieved at eval   LONG TERM GOALS:  (STG=LTG)  GOALS Name Target Date  Goal status  1 Pt will demonstrate she has regained full shoulder ROM and function post operatively compared to baselines.  Baseline: 09/29/24 INITIAL  2 Pt will demonstrate 155 degrees of R shoulder abduction to allow her to reach out to the side. 09/29/24 INITIAL  3 Pt will report a 75% improvement in L flank pain to allow improved comfort and mobility. 09/29/24 INITIAL  4 Pt will report 75% improvement in pain from cording with R shoulder abduction to allow improved comfort.  09/29/24 INITIAL  5 Pt will be independent in a home exercise program for continued stretching and strengthening.  09/29/24 INITIAL     PLAN:  PT FREQUENCY/DURATION: 2x/wk for 4 weeks  PLAN FOR NEXT SESSION: MLD R breast, pulleys, ball, MFR to cording in R axilla, QL stretches on L, PROM to R  shoulder   [facility]  [address], [address]  [place] [address]  [phone]  After Breast Cancer Class Video It is recommended you view the ABC class video to be educated on lymphedema risk reduction. This video lasts for about 30 minutes. It can be viewed on our website here: [email or web address]  Scar massage You can begin gentle scar massage to you incision sites. Gently place one hand on the incision and move the skin (without sliding on the skin) in various directions. Do this for a few minutes and then you can gently massage either coconut oil or vitamin E cream into the scars.  Compression garment You should continue wearing your compression bra until you feel like you no longer have swelling.  Home exercise Program Continue doing the exercises you were given until you feel like you can do them without  feeling any tightness at the end.   Walking Program Studies show that 30 minutes of walking per day (fast enough to elevate your heart rate) can significantly reduce the risk of a cancer recurrence. If you can't walk due to other medical reasons, we encourage you to find another activity you could do (like a stationary bike or water exercise).  Posture After breast cancer surgery, people frequently sit with rounded shoulders posture because it puts their incisions on slack and feels better. If you sit like this and scar tissue forms in that position, you can become very tight and have pain sitting or standing with good posture. Try to be aware of your posture and sit and stand up tall to heal properly.  Follow up PT: It is recommended you return every 3 months for the first 3 years following surgery to be assessed on the SOZO machine for an L-Dex score. This helps prevent clinically significant lymphedema in 95% of patients. These follow up screens are 10 minute appointments that you are not billed for.  [facility], PT 09/20/2024, 12:00 PM

## 2024-09-21 ENCOUNTER — Ambulatory Visit: Payer: Self-pay | Admitting: Obstetrics and Gynecology

## 2024-09-21 ENCOUNTER — Ambulatory Visit (INDEPENDENT_AMBULATORY_CARE_PROVIDER_SITE_OTHER): Admitting: Obstetrics and Gynecology

## 2024-09-21 ENCOUNTER — Encounter: Payer: Self-pay | Admitting: Obstetrics and Gynecology

## 2024-09-21 VITALS — BP 117/78 | HR 98 | Ht 67.0 in | Wt 174.0 lb

## 2024-09-21 DIAGNOSIS — Z9189 Other specified personal risk factors, not elsewhere classified: Secondary | ICD-10-CM

## 2024-09-21 DIAGNOSIS — N952 Postmenopausal atrophic vaginitis: Secondary | ICD-10-CM | POA: Diagnosis not present

## 2024-09-21 DIAGNOSIS — Z853 Personal history of malignant neoplasm of breast: Secondary | ICD-10-CM | POA: Diagnosis not present

## 2024-09-21 DIAGNOSIS — Z780 Asymptomatic menopausal state: Secondary | ICD-10-CM | POA: Diagnosis not present

## 2024-09-21 DIAGNOSIS — R8290 Unspecified abnormal findings in urine: Secondary | ICD-10-CM | POA: Diagnosis not present

## 2024-09-21 DIAGNOSIS — Z9289 Personal history of other medical treatment: Secondary | ICD-10-CM

## 2024-09-21 DIAGNOSIS — Z01419 Encounter for gynecological examination (general) (routine) without abnormal findings: Secondary | ICD-10-CM

## 2024-09-21 LAB — URINALYSIS, COMPLETE W/RFL CULTURE
Bacteria, UA: NONE SEEN /HPF
Bilirubin Urine: NEGATIVE
Glucose, UA: NEGATIVE
Hgb urine dipstick: NEGATIVE
Hyaline Cast: NONE SEEN /LPF
Ketones, ur: NEGATIVE
Leukocyte Esterase: NEGATIVE
Nitrites, Initial: NEGATIVE
Protein, ur: NEGATIVE
RBC / HPF: NONE SEEN /HPF (ref 0–2)
Specific Gravity, Urine: 1.015 (ref 1.001–1.035)
WBC, UA: NONE SEEN /HPF (ref 0–5)
pH: 5.5 (ref 5.0–8.0)

## 2024-09-21 LAB — NO CULTURE INDICATED

## 2024-09-21 NOTE — Patient Instructions (Addendum)
 Phone number for scheduling bone density at [facility], [phone].    EXERCISE AND DIET:  We recommended that you start or continue a regular exercise program for good health. Regular exercise means any activity that makes your heart beat faster and makes you sweat.  We recommend exercising at least 30 minutes per day at least 3 days a week, preferably 4 or 5.  We also recommend a diet low in fat and sugar.  Inactivity, poor dietary choices and obesity can cause diabetes, heart attack, stroke, and kidney damage, among others.    ALCOHOL AND SMOKING:  Women should limit their alcohol intake to no more than 7 drinks/beers/glasses of wine (combined, not each!) per week. Moderation of alcohol intake to this level decreases your risk of breast cancer and liver damage. And of course, no recreational drugs are part of a healthy lifestyle.  And absolutely no smoking or even second hand smoke. Most people know smoking can cause heart and lung diseases, but did you know it also contributes to weakening of your bones? Aging of your skin?  Yellowing of your teeth and nails?  CALCIUM AND VITAMIN D:  Adequate intake of calcium and Vitamin D are recommended.  The recommendations for exact amounts of these supplements seem to change often, but generally speaking 600 mg of calcium (either carbonate or citrate) and 800 units of Vitamin D per day seems prudent. Certain women may benefit from higher intake of Vitamin D.  If you are among these women, your doctor will have told you during your visit.    PAP SMEARS:  Pap smears, to check for cervical cancer or precancers,  have traditionally been done yearly, although recent scientific advances have shown that most women can have pap smears less often.  However, every woman still should have a physical exam from her gynecologist every year. It will include a breast check, inspection of the vulva and vagina to check for abnormal growths or skin changes, a visual exam of the  cervix, and then an exam to evaluate the size and shape of the uterus and ovaries.  And after 71 years of age, a rectal exam is indicated to check for rectal cancers. We will also provide age appropriate advice regarding health maintenance, like when you should have certain vaccines, screening for sexually transmitted diseases, bone density testing, colonoscopy, mammograms, etc.   MAMMOGRAMS:  All women over 6 years old should have a yearly mammogram. Many facilities now offer a "3D" mammogram, which may cost around $50 extra out of pocket. If possible,  we recommend you accept the option to have the 3D mammogram performed.  It both reduces the number of women who will be called back for extra views which then turn out to be normal, and it is better than the routine mammogram at detecting truly abnormal areas.    COLONOSCOPY:  Colonoscopy to screen for colon cancer is recommended for all women at age 70.  We know, you hate the idea of the prep.  We agree, BUT, having colon cancer and not knowing it is worse!!  Colon cancer so often starts as a polyp that can be seen and removed at colonscopy, which can quite literally save your life!  And if your first colonoscopy is normal and you have no family history of colon cancer, most women don't have to have it again for 10 years.  Once every ten years, you can do something that may end up saving your life, right?  We will  be happy to help you get it scheduled when you are ready.  Be sure to check your insurance coverage so you understand how much it will cost.  It may be covered as a preventative service at no cost, but you should check your particular policy.

## 2024-09-21 NOTE — Progress Notes (Unsigned)
 73 y.o. [id number] Single African American female here for a breast and pelvic exam.  Starting radiation this week.   The patient is also followed for new dx right breast cancer, ER positive, PR negative, HER2 negative. Had lumpectomy 08/12/24.  Has a breast seroma.    She has PALB2 mutation.    Notes urinary odor.   States A1C is 6.8.    Sees naturopathic provider.    PCP: [name], MD   No LMP recorded. Patient has had a hysterectomy.           Sexually active: No.  The current method of family planning is status post hysterectomy.    Menopausal hormone therapy:  n/a Exercising: Yes.    Doing physical therapy and walking her dog  Smoker:  no  OB History     Gravida  3   Para  3   Term  3   Preterm      AB      Living  3      SAB      IAB      Ectopic      Multiple      Live Births  3           HEALTH MAINTENANCE: Last 2 paps: years ago History of abnormal Pap or positive HPV:  yes, 1982 Hx of a conization of cervix  Mammogram:  07/06/24 Breast Density Cat B, BIRADS Cat 4 sus  Colonoscopy:  10/25/17 Bone Density:  08/02/2020- [facility]   Result  osteopenia     Immunization History  Administered Date(s) Administered   Fluad Quad(high Dose 65+) 08/20/2019   INFLUENZA, HIGH DOSE SEASONAL PF 12/03/2016, 10/07/2017, 08/25/2019, 03/13/2021   Influenza,inj,Quad PF,6+ Mos 12/13/2022   Influenza-Unspecified 08/16/2020   PFIZER(Purple Top)SARS-COV-2 Vaccination 12/12/2019, 01/02/2020, 01/02/2020, 03/13/2021   Pneumococcal Conjugate-13 09/05/2019   Pneumococcal Polysaccharide-23 03/13/2021      reports that she has never smoked. She has never used smokeless tobacco. She reports current alcohol use. She reports that she does not use drugs.  Past Medical History:  Diagnosis Date   Allergy    Anemia    Anxiety    Arthritis    CAD in native artery 11/27/2023   Cancer (HCC)    right breast 2025   Colitis    Dyspareunia    Endometriosis     Family history of breast cancer    GERD (gastroesophageal reflux disease)    High cholesterol    Hx of abnormal Pap smear 1980's   Hypertension    Osteopenia    Primary hypertension 11/27/2013   Ulcer    Urinary incontinence     Past Surgical History:  Procedure Laterality Date   ABDOMINAL HYSTERECTOMY  09/11/2008   TVH/BSO--Dr. Nikki with TVT   BILATERAL SALPINGOOPHORECTOMY     2009   BLADDER SUSPENSION  09/11/2008   Dr. Nikki   BREAST BIOPSY Right 07/09/2024   US  RT BREAST BX W LOC DEV 1ST LESION IMG BX SPEC US  GUIDE 07/09/2024 [facility]   BREAST BIOPSY  08/10/2024   US  RT RADIOACTIVE SEED LOC 08/10/2024 [facility]   BREAST LUMPECTOMY WITH RADIOACTIVE SEED AND SENTINEL LYMPH NODE BIOPSY Right 08/12/2024   Procedure: BREAST LUMPECTOMY WITH RADIOACTIVE SEED AND SENTINEL LYMPH NODE BIOPSY W/MAGTRACE;  Surgeon: Curvin Deward MOULD, MD;  Location: [facility];  Service: General;  Laterality: Right;  GEN w/PEC BLOCK RIGHT BREAST RADIOACTIVE SEED LUMPECTOMY SENTINEL NODE BIOPSY   CERVIX LESION DESTRUCTION  11/11/1978   hx abnormal pap--dysplasia   COLONOSCOPY WITH PROPOFOL  Left 10/25/2017   Procedure: COLONOSCOPY WITH PROPOFOL ;  Surgeon: Saintclair Jasper, MD;  Location: [facility];  Service: Gastroenterology;  Laterality: Left;    Current Outpatient Medications  Medication Sig Dispense Refill   acetaminophen  (TYLENOL ) 500 MG tablet Take 500-1,000 mg by mouth every 6 (six) hours as needed (pain.).     azelastine (ASTELIN) 0.1 % nasal spray Place 1-2 sprays into both nostrils 2 (two) times daily as needed for allergies. Use in each nostril as directed     Calcium -Vitamin D -Vitamin K (CHEWABLE CALCIUM  PO) Take 1 tablet by mouth in the morning.     ciclopirox  (PENLAC ) 8 % solution Apply topically at bedtime. Apply over nail and surrounding skin. Apply daily over previous coat. After seven (7) days, may remove with alcohol and continue cycle. 6.6 mL 3   diclofenac  Sodium (VOLTAREN ) 1 %  GEL Apply 1 Application topically 4 (four) times daily as needed (hand/wrist pain.).     ezetimibe  (ZETIA ) 10 MG tablet Take 1 tablet (10 mg total) by mouth daily. 90 tablet 3   Ferrous Sulfate (IRON PO) Take 1 tablet by mouth in the morning.     fluticasone (FLONASE) 50 MCG/ACT nasal spray Place 1 spray into both nostrils daily as needed for allergies or rhinitis.     HAWTHORN PO Take 1 Capful by mouth 3 (three) times a week.     hydrochlorothiazide  (HYDRODIURIL ) 12.5 MG tablet Take 1 tablet (12.5 mg total) by mouth daily. 90 tablet 3   ibuprofen  (ADVIL ) 200 MG tablet Take 400 mg by mouth every 8 (eight) hours as needed (pain.).     nitroGLYCERIN  (NITROSTAT ) 0.4 MG SL tablet Place 1 tablet (0.4 mg total) under the tongue every 5 (five) minutes as needed for chest pain. 30 tablet 3   Omega-3 Fatty Acids (EPA PO) Take 1 capsule by mouth in the morning. Super EPA     omeprazole  (PRILOSEC) 40 MG capsule Take 40 mg by mouth daily before breakfast.     Polyethyl Glycol-Propyl Glycol (SYSTANE) 0.4-0.3 % SOLN Place 1-2 drops into both eyes 3 (three) times daily as needed (dry/irritated eyes.).     Povidone (IVIZIA DRY EYES OP) Place 1 drop into both eyes daily.     rosuvastatin  (CRESTOR ) 5 MG tablet Take 1 tablet by mouth 2 to 3 days per week 90 tablet 3   VITAMIN D  PO Take 1,000 Units by mouth in the morning.     No current facility-administered medications for this visit.    ALLERGIES: Amoxicillin -pot clavulanate, Ciprofloxacin, Macrobid [nitrofurantoin], Pantoprazole  sodium, and Sulfa antibiotics  Family History  Problem Relation Age of Onset   Diabetes Mother    Hypertension Mother    Thyroid  disease Sister    Lung cancer Brother    Seizures Brother    Diabetes Brother    Hypertension Brother    Diabetes Brother    Breast cancer Maternal Cousin    Arthritis Other    Diabetes Other     Review of Systems  See HPI.  PHYSICAL EXAM:  BP 117/78 (BP Location: Left Arm, Patient Position:  Sitting)   Pulse 98   Ht 5' 7 (1.702 m)   Wt 174 lb (78.9 kg)   SpO2 90%   BMI 27.25 kg/m     General appearance: alert, cooperative and appears stated age Head: normocephalic, without obvious abnormality, atraumatic Neck: no adenopathy, supple, symmetrical, trachea midline and thyroid  normal to inspection  and palpation Lungs: clear to auscultation bilaterally Breasts: right - normal appearance, 3 cm tender inferior breast mass, nipple inversion, No nipple discharge or bleeding, No axillary adenopath, right axillary incision,   Left - normal appearance, no masses or tenderness, No nipple retraction or dimpling, No nipple discharge or bleeding, No axillary adenopathy Heart: regular rate and rhythm Abdomen: soft, non-tender; no masses, no organomegaly Extremities: extremities normal, atraumatic, no cyanosis or edema Skin: skin color, texture, turgor normal. No rashes or lesions Lymph nodes: cervical, supraclavicular, and axillary nodes normal. Neurologic: grossly normal  Pelvic: External genitalia:  no lesions              No abnormal inguinal nodes palpated.              Urethra:  normal appearing urethra with no masses, tenderness or lesions              Bartholins and Skenes: normal                 Vagina: normal appearing vagina with normal color and discharge, no lesions.  Atrophy noted.               Cervix:  absent              Pap taken: no Bimanual Exam:  Uterus: absent              Adnexa: no mass, fullness, tenderness              Rectal exam: deferred to diarrhea.   Chaperone was present for exam:  Kari HERO, CMA  ASSESSMENT: Encounter for breast and pelvic exam.  Other specified risk factors.  Personal history of other medical treatment.  Right breast cancer.  ER positive, PR negative , HER2 negative.  PALB2 mutation:  increased risk of breast, ovarian, pancreatic, and pancreatic CA. Status post TVH/BSO/TVT.  Status post anterior colporrhaphy with biological mesh.   Hx of vaginal atrophy.  Mixed incontinence.  Urinary odor.   Fecal incontinence.  Improved.   Constipation.  Osteopenia.   PLAN: Mammogram screening discussed. Self breast awareness reviewed. Pap and HRV collected:  no. . Not indicated.  Guidelines for Calcium , Vitamin D , regular exercise program including cardiovascular and weight bearing exercise. Medication refills:  NA Dexa at [facility].   Urinalysis negative.  No UC needed. Replens for vaginal atrophy.  Follow up:  yearly and prn.     Additional counseling given.  yes. 35 min  total time was spent for this patient encounter, including preparation, face-to-face counseling with the patient, coordination of care, and documentation of the encounter in addition to doing the breast and pelvic exam.

## 2024-09-22 ENCOUNTER — Ambulatory Visit: Admitting: Physical Therapy

## 2024-09-22 DIAGNOSIS — M79621 Pain in right upper arm: Secondary | ICD-10-CM

## 2024-09-22 DIAGNOSIS — M25611 Stiffness of right shoulder, not elsewhere classified: Secondary | ICD-10-CM

## 2024-09-22 DIAGNOSIS — R293 Abnormal posture: Secondary | ICD-10-CM

## 2024-09-22 DIAGNOSIS — R10A2 Flank pain, left side: Secondary | ICD-10-CM

## 2024-09-22 DIAGNOSIS — C50311 Malignant neoplasm of lower-inner quadrant of right female breast: Secondary | ICD-10-CM

## 2024-09-22 DIAGNOSIS — Z483 Aftercare following surgery for neoplasm: Secondary | ICD-10-CM

## 2024-09-22 DIAGNOSIS — R600 Localized edema: Secondary | ICD-10-CM

## 2024-09-22 NOTE — Therapy (Signed)
 OUTPATIENT PHYSICAL THERAPY BREAST CANCER POST OP FOLLOW UP   Patient Name: Margaret [name] MRN: [id number] DOB:Mar 28, 1951, 72 y.o., female Today's Date: 09/22/2024  END OF SESSION:  PT End of Session - 09/22/24 1153     Visit Number 8    Number of Visits 10    Date for Recertification  09/29/24    PT Start Time 1105    PT Stop Time 1156    PT Time Calculation (min) 51 min    Activity Tolerance Patient tolerated treatment well    Behavior During Therapy [facility] for tasks assessed/performed            Past Medical History:  Diagnosis Date   Allergy    Anemia    Anxiety    Arthritis    CAD in native artery 11/27/2023   Cancer (HCC)    right breast 2025   Colitis    Dyspareunia    Endometriosis    Family history of breast cancer    GERD (gastroesophageal reflux disease)    High cholesterol    Hx of abnormal Pap smear 1980's   Hypertension    Osteopenia    Primary hypertension 11/27/2013   Ulcer    Urinary incontinence    Past Surgical History:  Procedure Laterality Date   ABDOMINAL HYSTERECTOMY  09/11/2008   TVH/BSO--Dr. Nikki with TVT   BILATERAL SALPINGOOPHORECTOMY     2009   BLADDER SUSPENSION  09/11/2008   Dr. Nikki   BREAST BIOPSY Right 07/09/2024   US  RT BREAST BX W LOC DEV 1ST LESION IMG BX SPEC US  GUIDE 07/09/2024 [facility]   BREAST BIOPSY  08/10/2024   US  RT RADIOACTIVE SEED LOC 08/10/2024 [facility]   BREAST LUMPECTOMY WITH RADIOACTIVE SEED AND SENTINEL LYMPH NODE BIOPSY Right 08/12/2024   Procedure: BREAST LUMPECTOMY WITH RADIOACTIVE SEED AND SENTINEL LYMPH NODE BIOPSY W/MAGTRACE;  Surgeon: Curvin Deward MOULD, MD;  Location: [facility];  Service: General;  Laterality: Right;  GEN w/PEC BLOCK RIGHT BREAST RADIOACTIVE SEED LUMPECTOMY SENTINEL NODE BIOPSY   CERVIX LESION DESTRUCTION  11/11/1978   hx abnormal pap--dysplasia   COLONOSCOPY WITH PROPOFOL  Left 10/25/2017   Procedure: COLONOSCOPY WITH PROPOFOL ;  Surgeon: Saintclair Jasper, MD;   Location: [facility];  Service: Gastroenterology;  Laterality: Left;   Patient Active Problem List   Diagnosis Date Noted   Genetic testing 08/03/2024   Family history of breast cancer    Malignant neoplasm of lower-inner quadrant of right breast of female, estrogen receptor positive (HCC) 07/16/2024   Hyperlipidemia LDL goal <70 03/09/2024   CAD in native artery 11/27/2023   Gastroesophageal reflux disease 06/06/2020   Atypical chest pain 04/21/2020   Closed nondisplaced fracture of proximal phalanx of lesser toe of left foot 11/19/2017   GI bleed 10/23/2017   Hypokalemia 10/23/2017   Nausea vomiting and diarrhea 10/23/2017   Hematochezia 10/23/2017   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 Deward Curvin  REFERRING DIAG: R breast cancer  THERAPY DIAG:  Stiffness of right shoulder, not elsewhere classified  Aftercare following surgery for neoplasm  Localized edema  Pain in right upper arm  Flank pain, left side  Abnormal posture  Malignant neoplasm of lower-inner quadrant of right breast of female, estrogen receptor positive (HCC)  Rationale for Evaluation and Treatment:  Rehabilitation  ONSET DATE: 07/13/24  SUBJECTIVE:                                                                                                                                                                                           SUBJECTIVE STATEMENT: I am still feeling the cording in my armpit.   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 It measures 1.1 cm and is located in the lower inner quadrant. It is ER positive, PR negative, and HER2 negative with a Ki67 of 25%. 08/12/24- R breast lumpectomy and SLNB  0/4  PATIENT GOALS:  Reassess how my recovery is going related to arm function, pain, and swelling.  PAIN:  Are you having pain? Yes: NPRS scale: 3-4/10 Pain location: R axilla Pain description: soreness Aggravating factors: reaching with the arm Relieving factors: pt is unsure, staying still, exercises  PRECAUTIONS: Recent Surgery, right UE Lymphedema risk,   RED FLAGS: None   ACTIVITY LEVEL / LEISURE: currently doing post op exercises   OBJECTIVE:   PATIENT SURVEYS:  QUICK DASH:     OBSERVATIONS: Fibrotic area palpable in area of SLNB - feels similar to a seroma, healing lumpectomy scar with glue still intact, post op swelling visible in inferior breast  POSTURE:  Forward head and rounded shoulders posture   UPPER EXTREMITY AROM/PROM:   A/PROM RIGHT   eval   RIGHT 09/01/24 RIGHT 09/16/24  Shoulder extension 43 62   Shoulder flexion 149 148 162  Shoulder abduction 155 130 155  Shoulder internal rotation 79 69 66  Shoulder external rotation 79 80                           (Blank rows = not tested)   A/PROM LEFT   eval  Shoulder extension 53  Shoulder flexion 143  Shoulder abduction 161  Shoulder internal rotation 80  Shoulder external rotation 90                          (Blank rows = not tested)   CERVICAL AROM: All within functional limits   UPPER EXTREMITY STRENGTH: WFL   LYMPHEDEMA ASSESSMENTS (in cm):    LANDMARK RIGHT   eval 09/01/24  10 cm proximal to olecranon process 30.9 30.5  Olecranon process 27.4 27.8  10 cm proximal to ulnar styloid process 24.1 22  Just proximal to ulnar styloid process 16.8 16.5  Across hand at thumb web space 19.4 19.6  At base of 2nd digit 5.9 5.7  (  Blank rows = not tested)   LANDMARK LEFT   eval  10 cm proximal to olecranon process 30  Olecranon process 26.9  10 cm proximal to ulnar styloid process 22.4  Just proximal to ulnar styloid process 16.1  Across hand at thumb web space 19  At base of 2nd digit  6  (Blank rows = not tested)  Surgery type/Date: 08/12/24 R breast lumpectomy and SLNB Number of lymph nodes removed: 0/4 Current/past treatment (chemo, radiation, hormone therapy): will require radiation and hormone therapy  Other symptoms:  Heaviness/tightness Yes Pain Yes Pitting edema No Infections No Decreased scar mobility Yes Stemmer sign No  TREATMENT PERFORMED: 09/22/24: THERAPEUTIC EXERCISE: Pulleys x 2 min in direction of flexion and 2 min in direction of abduction Ball up wall x 10 reps in direction of flexion and 10 reps in direction of R shoulder abduction THERAPEUTIC ACTIVITY: Supine on mat table: alternating flexion x 10, bilateral scaption x 10, snow angels x 10 MANUAL THERAPY: MFR to cording in R axilla with cording more difficult to palpate  PROM in supine in to flexion and abduction Gentle STM to seroma in inferior breast as well as MLD moving fluid towards pathway aimed at right lateral trunk towards groin   09/20/24: THERAPEUTIC EXERCISE: Pulleys x 2 min in direction of flexion and 2 min in direction of abduction Ball up wall x 10 reps in direction of flexion and 10 reps in direction of R shoulder abduction MANUAL THERAPY: MFR to cording in R axilla and upper arm with numerous cords palpable with UE in 90 degrees of abduction propped on pillows PROM in supine in to flexion and abduction Gentle STM to seroma in inferior breast as well as MLD moving fluid towards pathway aimed at right lateral trunk towards groin  09/16/24: THERAPEUTIC EXERCISE: Pulleys x 2 min in direction of flexion and 2 min in direction of abduction Ball up wall x 10 reps in direction of flexion and 10 reps in direction of R shoulder abduction THERAPEUTIC ACTIVITY: Supine over 1/2 foam roll: alternating flexion x 10, bilateral scaption x 10, snow angels (very limited motion due to tightness) Supine over 1/2 foam roll: supine scapular series and had her return demo 10 reps of each as  follows with yellow band while therapist provided appropriate v/c and t/c: narrow and wide grip flexion, horizontal abduction, ER, bilateral diagonals MANUAL THERAPY: MFR to cording in R axilla and upper arm with numerous cords palpable with UE in 90 degrees with cording extending past antecubital fossa PROM in supine in to flexion and abduction  09/14/24: THERAPEUTIC EXERCISE: Pulleys x 2 min in direction of flexion and 2 min in direction of abduction Ball up wall x 10 reps in direction of flexion and 10 reps in direction of R shoulder abduction THERAPEUTIC ACTIVITY: Supine over 1/2 foam roll: alternating flexion x 10, bilateral scaption x 10, snow angels (very limited motion due to tightness) Supine over 1/2 foam roll: supine scapular series and had her return demo 10 reps of each as follows with yellow band while therapist provided appropriate v/c and t/c: narrow and wide grip flexion, horizontal abduction, ER, bilateral diagonals MANUAL THERAPY: MFR to cording in R axilla with numerous cords palpable with UE in 90 degrees of abduction PROM in supine in to flexion, abduction and ER to pt's tolerance with v/c and t/c to keep shoulder relaxed Gentle STM to seroma in inferior breast as well as MLD moving fluid towards pathway aimed at right lateral  trunk towards groin  09/09/24: THERAPEUTIC EXERCISE: Pulleys x 2 min in direction of flexion and 2 min in direction of abduction Ball up wall x 10 reps in direction of flexion and 10 reps in direction of R shoulder abduction Instructed pt in supine scapular series and had her return demo 10 reps of each as follows with yellow band while therapist provided appropriate v/c and t/c: narrow and wide grip flexion, horizontal abduction, ER, bilateral diagonals MANUAL THERAPY: MFR to cording in R axilla with numerous cords palpable with UE in 120 degrees of abduction support on pillow with elbow bent PROM in supine in to flexion, abduction and ER to pt's  tolerance  09/07/24: THERAPEUTIC EXERCISE: Pulleys x 2 min in direction of flexion and 2 min in direction of abduction Ball up wall x 10 reps in direction of flexion and 10 reps in direction of R shoulder abduction MANUAL THERAPY: MFR to cording in R axilla with numerous cords palpable with UE in 120 degrees of abduction support on pillow with elbow bent Gentle STM to area of fibrosis/seroma in R breast - educated pt to let her radiation dr know about this to be sure it will not interfere with radation  PATIENT EDUCATION:  Education details: ABC class, need for compression bra, axillary cording, seroma vs scar tissue, need for stretching for cording, side bending QL stretch Person educated: Patient Education method: Explanation, Demonstration, and Handouts Education comprehension: verbalized understanding  HOME EXERCISE PROGRAM: Reviewed previously given post op HEP. Wear compression bra Quadratus lumborum stretch 1-2x/daily holding for 30-60 seconds  ASSESSMENT:  CLINICAL IMPRESSION: Pt's back pain was better today so continued with supine exercises but did them on the mat table without the foam roll as to not aggravate her back pain. Cording was improved today compared to last session but pt still feeling increased tightness in her axilla. Her seroma/fibrosis is softening so continued to do manual therapy in this area followed by MLD to continue to decrease seroma/fibrosis.   Pt will benefit from skilled therapeutic intervention to improve on the following deficits: Decreased knowledge of precautions, impaired UE functional use, pain, decreased ROM, postural dysfunction.   PT treatment/interventions: ADL/Self care home management, [phone]- PT Re-evaluation, 97110-Therapeutic exercises, 97530- Therapeutic activity, [id number]- Neuromuscular re-education, 97535- Self Care, [id number]- Manual therapy, [phone]- Orthotic Initial, [phone]- Orthotic/Prosthetic subsequent, and Patient/Family  education   GOALS: Goals reviewed with patient? Yes  GOALS MET AT EVAL:  GOALS Name Target Date Goal status  1 Pt will be able to verbalize understanding of pertinent lymphedema risk reduction practices relevant to her dx specifically related to skin care.  Baseline:  No knowledge Eval Achieved at eval  2 Pt will be able to return demo and/or verbalize understanding of the post op HEP related to regaining shoulder ROM. Baseline:  No knowledge Eval Achieved at eval  3 Pt will be able to verbalize understanding of the importance of viewing the post op After Breast CA Class video for further lymphedema risk reduction education and therapeutic exercise.  Baseline:  No knowledge Eval Achieved at eval   LONG TERM GOALS:  (STG=LTG)  GOALS Name Target Date  Goal status  1 Pt will demonstrate she has regained full shoulder ROM and function post operatively compared to baselines.  Baseline: 09/29/24 INITIAL  2 Pt will demonstrate 155 degrees of R shoulder abduction to allow her to reach out to the side. 09/29/24 INITIAL  3 Pt will report a 75% improvement in L flank pain to allow  improved comfort and mobility. 09/29/24 INITIAL  4 Pt will report 75% improvement in pain from cording with R shoulder abduction to allow improved comfort.  09/29/24 INITIAL  5 Pt will be independent in a home exercise program for continued stretching and strengthening.  09/29/24 INITIAL     PLAN:  PT FREQUENCY/DURATION: 2x/wk for 4 weeks  PLAN FOR NEXT SESSION: MLD R breast, pulleys, ball, MFR to cording in R axilla, QL stretches on L, PROM to R shoulder   [facility]  [address], [address]  [place] [address]  [phone]  After Breast Cancer Class Video It is recommended you view the ABC class video to be educated on lymphedema risk reduction. This video lasts for about 30 minutes. It can be viewed on our website here:  [email or web address]  Scar massage You can begin gentle scar massage to you incision sites. Gently place one hand on the incision and move the skin (without sliding on the skin) in various directions. Do this for a few minutes and then you can gently massage either coconut oil or vitamin E cream into the scars.  Compression garment You should continue wearing your compression bra until you feel like you no longer have swelling.  Home exercise Program Continue doing the exercises you were given until you feel like you can do them without feeling any tightness at the end.   Walking Program Studies show that 30 minutes of walking per day (fast enough to elevate your heart rate) can significantly reduce the risk of a cancer recurrence. If you can't walk due to other medical reasons, we encourage you to find another activity you could do (like a stationary bike or water exercise).  Posture After breast cancer surgery, people frequently sit with rounded shoulders posture because it puts their incisions on slack and feels better. If you sit like this and scar tissue forms in that position, you can become very tight and have pain sitting or standing with good posture. Try to be aware of your posture and sit and stand up tall to heal properly.  Follow up PT: It is recommended you return every 3 months for the first 3 years following surgery to be assessed on the SOZO machine for an L-Dex score. This helps prevent clinically significant lymphedema in 95% of patients. These follow up screens are 10 minute appointments that you are not billed for.  [facility], PT 09/22/2024, 12:02 PM

## 2024-09-23 ENCOUNTER — Other Ambulatory Visit: Payer: Self-pay

## 2024-09-23 ENCOUNTER — Ambulatory Visit
Admission: RE | Admit: 2024-09-23 | Discharge: 2024-09-23 | Disposition: A | Discharge status: Home / Self Care | Source: Ambulatory Visit | Attending: Radiation Oncology | Admitting: Radiation Oncology

## 2024-09-23 DIAGNOSIS — C50311 Malignant neoplasm of lower-inner quadrant of right female breast: Secondary | ICD-10-CM | POA: Diagnosis not present

## 2024-09-23 LAB — RAD ONC ARIA SESSION SUMMARY
Course Elapsed Days: 0
Plan Fractions Treated to Date: 1
Plan Prescribed Dose Per Fraction: 2.67 Gy
Plan Total Fractions Prescribed: 15
Plan Total Prescribed Dose: 40.05 Gy
Reference Point Dosage Given to Date: 2.67 Gy
Reference Point Session Dosage Given: 2.67 Gy
Session Number: 1

## 2024-09-24 ENCOUNTER — Other Ambulatory Visit: Payer: Self-pay

## 2024-09-24 ENCOUNTER — Ambulatory Visit
Admission: RE | Admit: 2024-09-24 | Discharge: 2024-09-24 | Disposition: A | Discharge status: Home / Self Care | Source: Ambulatory Visit | Attending: Radiation Oncology | Admitting: Radiation Oncology

## 2024-09-24 DIAGNOSIS — C50311 Malignant neoplasm of lower-inner quadrant of right female breast: Secondary | ICD-10-CM

## 2024-09-24 LAB — RAD ONC ARIA SESSION SUMMARY
Course Elapsed Days: 1
Plan Fractions Treated to Date: 2
Plan Prescribed Dose Per Fraction: 2.67 Gy
Plan Total Fractions Prescribed: 15
Plan Total Prescribed Dose: 40.05 Gy
Reference Point Dosage Given to Date: 5.34 Gy
Reference Point Session Dosage Given: 2.67 Gy
Session Number: 2

## 2024-09-24 MED ORDER — ALRA NON-METALLIC DEODORANT (RAD-ONC)
1.0000 | Freq: Once | TOPICAL | Status: AC
  Administered 2024-09-24: 1 via TOPICAL

## 2024-09-24 MED ORDER — RADIAPLEXRX EX GEL: Freq: Once | CUTANEOUS | Status: AC

## 2024-09-27 ENCOUNTER — Other Ambulatory Visit: Payer: Self-pay

## 2024-09-27 ENCOUNTER — Ambulatory Visit
Admission: RE | Admit: 2024-09-27 | Discharge: 2024-09-27 | Disposition: A | Discharge status: Home / Self Care | Source: Ambulatory Visit | Attending: Radiation Oncology | Admitting: Radiation Oncology

## 2024-09-27 DIAGNOSIS — C50311 Malignant neoplasm of lower-inner quadrant of right female breast: Secondary | ICD-10-CM | POA: Diagnosis not present

## 2024-09-27 LAB — RAD ONC ARIA SESSION SUMMARY
Course Elapsed Days: 4
Plan Fractions Treated to Date: 3
Plan Prescribed Dose Per Fraction: 2.67 Gy
Plan Total Fractions Prescribed: 15
Plan Total Prescribed Dose: 40.05 Gy
Reference Point Dosage Given to Date: 8.01 Gy
Reference Point Session Dosage Given: 2.67 Gy
Session Number: 3

## 2024-09-28 ENCOUNTER — Ambulatory Visit: Admitting: Physical Therapy

## 2024-09-28 ENCOUNTER — Other Ambulatory Visit: Payer: Self-pay

## 2024-09-28 ENCOUNTER — Telehealth: Payer: Self-pay | Admitting: Physical Therapy

## 2024-09-28 ENCOUNTER — Ambulatory Visit
Admission: RE | Admit: 2024-09-28 | Discharge: 2024-09-28 | Disposition: A | Source: Ambulatory Visit | Attending: Radiation Oncology | Admitting: Radiation Oncology

## 2024-09-28 ENCOUNTER — Ambulatory Visit
Admission: RE | Admit: 2024-09-28 | Discharge: 2024-09-28 | Disposition: A | Discharge status: Home / Self Care | Source: Ambulatory Visit | Attending: Radiation Oncology | Admitting: Radiation Oncology

## 2024-09-28 DIAGNOSIS — C50311 Malignant neoplasm of lower-inner quadrant of right female breast: Secondary | ICD-10-CM | POA: Diagnosis not present

## 2024-09-28 LAB — RAD ONC ARIA SESSION SUMMARY
Course Elapsed Days: 5
Plan Fractions Treated to Date: 4
Plan Prescribed Dose Per Fraction: 2.67 Gy
Plan Total Fractions Prescribed: 15
Plan Total Prescribed Dose: 40.05 Gy
Reference Point Dosage Given to Date: 10.68 Gy
Reference Point Session Dosage Given: 2.67 Gy
Session Number: 4

## 2024-09-28 NOTE — Telephone Encounter (Signed)
 Called pt because she was a no show for 2 pm appt. She was at the cancer center because her appt there was running late and she thought her appt here was at 3 pm. Confirmed time for Thursday appt.  Margaret [name], [place] 09/28/24 2:59 PM

## 2024-09-29 ENCOUNTER — Ambulatory Visit
Admission: RE | Admit: 2024-09-29 | Discharge: 2024-09-29 | Disposition: A | Discharge status: Home / Self Care | Source: Ambulatory Visit | Attending: Radiation Oncology | Admitting: Radiation Oncology

## 2024-09-29 ENCOUNTER — Other Ambulatory Visit: Payer: Self-pay

## 2024-09-29 DIAGNOSIS — C50311 Malignant neoplasm of lower-inner quadrant of right female breast: Secondary | ICD-10-CM | POA: Diagnosis not present

## 2024-09-29 LAB — RAD ONC ARIA SESSION SUMMARY
Course Elapsed Days: 6
Plan Fractions Treated to Date: 5
Plan Prescribed Dose Per Fraction: 2.67 Gy
Plan Total Fractions Prescribed: 15
Plan Total Prescribed Dose: 40.05 Gy
Reference Point Dosage Given to Date: 13.35 Gy
Reference Point Session Dosage Given: 2.67 Gy
Session Number: 5

## 2024-09-30 ENCOUNTER — Ambulatory Visit
Admission: RE | Admit: 2024-09-30 | Discharge: 2024-09-30 | Disposition: A | Source: Ambulatory Visit | Attending: Radiation Oncology | Admitting: Radiation Oncology

## 2024-09-30 ENCOUNTER — Ambulatory Visit: Admitting: Physical Therapy

## 2024-09-30 ENCOUNTER — Encounter: Payer: Self-pay | Admitting: Physical Therapy

## 2024-09-30 ENCOUNTER — Other Ambulatory Visit: Payer: Self-pay

## 2024-09-30 DIAGNOSIS — Z170 Estrogen receptor positive status [ER+]: Secondary | ICD-10-CM

## 2024-09-30 DIAGNOSIS — C50311 Malignant neoplasm of lower-inner quadrant of right female breast: Secondary | ICD-10-CM | POA: Diagnosis not present

## 2024-09-30 DIAGNOSIS — M25611 Stiffness of right shoulder, not elsewhere classified: Secondary | ICD-10-CM

## 2024-09-30 DIAGNOSIS — Z483 Aftercare following surgery for neoplasm: Secondary | ICD-10-CM

## 2024-09-30 DIAGNOSIS — R600 Localized edema: Secondary | ICD-10-CM

## 2024-09-30 DIAGNOSIS — R293 Abnormal posture: Secondary | ICD-10-CM

## 2024-09-30 DIAGNOSIS — M79621 Pain in right upper arm: Secondary | ICD-10-CM

## 2024-09-30 DIAGNOSIS — R10A2 Flank pain, left side: Secondary | ICD-10-CM

## 2024-09-30 LAB — RAD ONC ARIA SESSION SUMMARY
Course Elapsed Days: 7
Plan Fractions Treated to Date: 6
Plan Prescribed Dose Per Fraction: 2.67 Gy
Plan Total Fractions Prescribed: 15
Plan Total Prescribed Dose: 40.05 Gy
Reference Point Dosage Given to Date: 16.02 Gy
Reference Point Session Dosage Given: 2.67 Gy
Session Number: 6

## 2024-09-30 NOTE — Therapy (Signed)
 OUTPATIENT PHYSICAL THERAPY BREAST CANCER POST OP FOLLOW UP   Patient Name: Margaret [name] MRN: [id number] DOB:[birth year], 73 y.o., female Today's Date: 09/30/2024  END OF SESSION:  PT End of Session - 09/30/24 1356     Visit Number 9    Number of Visits 17    Date for Recertification  10/28/24    PT Start Time 1415    PT Stop Time 1458    PT Time Calculation (min) 43 min    Activity Tolerance Patient tolerated treatment well    Behavior During Therapy [facility] for tasks assessed/performed            Past Medical History:  Diagnosis Date   Allergy    Anemia    Anxiety    Arthritis    CAD in native artery 11/27/2023   Cancer (HCC)    right breast 2025   Colitis    Dyspareunia    Endometriosis    Family history of breast cancer    GERD (gastroesophageal reflux disease)    High cholesterol    Hx of abnormal Pap smear 1980's   Hypertension    Osteopenia    Primary hypertension 11/27/2013   Ulcer    Urinary incontinence    Past Surgical History:  Procedure Laterality Date   ABDOMINAL HYSTERECTOMY  09/11/2008   TVH/BSO--Dr. Nikki with TVT   BILATERAL SALPINGOOPHORECTOMY     2009   BLADDER SUSPENSION  09/11/2008   Dr. Nikki   BREAST BIOPSY Right 07/09/2024   US  RT BREAST BX W LOC DEV 1ST LESION IMG BX SPEC US  GUIDE 07/09/2024 [facility]   BREAST BIOPSY  08/10/2024   US  RT RADIOACTIVE SEED LOC 08/10/2024 [facility]   BREAST LUMPECTOMY WITH RADIOACTIVE SEED AND SENTINEL LYMPH NODE BIOPSY Right 08/12/2024   Procedure: BREAST LUMPECTOMY WITH RADIOACTIVE SEED AND SENTINEL LYMPH NODE BIOPSY W/MAGTRACE;  Surgeon: Curvin Deward MOULD, MD;  Location: [facility] OR;  Service: General;  Laterality: Right;  GEN w/PEC BLOCK RIGHT BREAST RADIOACTIVE SEED LUMPECTOMY SENTINEL NODE BIOPSY   CERVIX LESION DESTRUCTION  11/11/1978   hx abnormal pap--dysplasia   COLONOSCOPY WITH PROPOFOL  Left 10/25/2017   Procedure: COLONOSCOPY WITH PROPOFOL ;  Surgeon: Saintclair Jasper, MD;   Location: [facility];  Service: Gastroenterology;  Laterality: Left;   Patient Active Problem List   Diagnosis Date Noted   Genetic testing 08/03/2024   Family history of breast cancer    Malignant neoplasm of lower-inner quadrant of right breast of female, estrogen receptor positive (HCC) 07/16/2024   Hyperlipidemia LDL goal <70 03/09/2024   CAD in native artery 11/27/2023   Gastroesophageal reflux disease 06/06/2020   Atypical chest pain 04/21/2020   Closed nondisplaced fracture of proximal phalanx of lesser toe of left foot 11/19/2017   GI bleed 10/23/2017   Hypokalemia 10/23/2017   Nausea vomiting and diarrhea 10/23/2017   Hematochezia 10/23/2017   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 Deward Curvin  REFERRING DIAG: R breast cancer  THERAPY DIAG:  Stiffness of right shoulder, not elsewhere classified - Plan: PT plan of care cert/re-cert  Aftercare following surgery for neoplasm - Plan: PT plan of care cert/re-cert  Localized edema - Plan: PT plan of care cert/re-cert  Pain in right upper arm - Plan: PT plan of care cert/re-cert  Flank pain, left side - Plan: PT plan of care cert/re-cert  Abnormal posture - Plan: PT plan of care cert/re-cert  Malignant neoplasm of lower-inner quadrant of right breast of female, estrogen receptor positive (HCC) - Plan: PT plan of care cert/re-cert  Rationale for Evaluation and Treatment: Rehabilitation  ONSET DATE: 07/13/24  SUBJECTIVE:                                                                                                                                                                                           SUBJECTIVE STATEMENT: I am getting tired and I am only on day 6 of radiation.   PERTINENT HISTORY:  Patient was  diagnosed on 07/13/2024 with right grade 2 invasive mammary cancer with lobular features. It measures 1.1 cm and is located in the lower inner quadrant. It is ER positive, PR negative, and HER2 negative with a Ki67 of 25%. 08/12/24- R breast lumpectomy and SLNB 0/4  PATIENT GOALS:  Reassess how my recovery is going related to arm function, pain, and swelling.  PAIN:  Are you having pain? Yes: NPRS scale: 1-2/10 Pain location: R axilla Pain description: soreness Aggravating factors: reaching with the arm Relieving factors: pt is unsure, staying still, exercises  PRECAUTIONS: Recent Surgery, right UE Lymphedema risk,   RED FLAGS: None   ACTIVITY LEVEL / LEISURE: currently doing post op exercises   OBJECTIVE:   PATIENT SURVEYS:  QUICK DASH:     OBSERVATIONS: Fibrotic area palpable in area of SLNB - feels similar to a seroma, healing lumpectomy scar with glue still intact, post op swelling visible in inferior breast  POSTURE:  Forward head and rounded shoulders posture   UPPER EXTREMITY AROM/PROM:   A/PROM RIGHT   eval   RIGHT 09/01/24 RIGHT 09/16/24  Shoulder extension 43 62   Shoulder flexion 149 148 162  Shoulder abduction 155 130 155  Shoulder internal rotation 79 69 66  Shoulder external rotation 79 80                           (Blank rows = not tested)   A/PROM LEFT   eval  Shoulder extension 53  Shoulder flexion 143  Shoulder abduction 161  Shoulder internal rotation 80  Shoulder external rotation 90                          (Blank rows = not tested)   CERVICAL AROM: All within functional limits   UPPER EXTREMITY STRENGTH: WFL   LYMPHEDEMA ASSESSMENTS (in cm):    LANDMARK RIGHT  eval 09/01/24  10 cm proximal to olecranon process 30.9 30.5  Olecranon process 27.4 27.8  10 cm proximal to ulnar styloid process 24.1 22  Just proximal to ulnar styloid process 16.8 16.5  Across hand at thumb web space 19.4 19.6  At base of 2nd digit 5.9 5.7  (Blank  rows = not tested)   LANDMARK LEFT   eval  10 cm proximal to olecranon process 30  Olecranon process 26.9  10 cm proximal to ulnar styloid process 22.4  Just proximal to ulnar styloid process 16.1  Across hand at thumb web space 19  At base of 2nd digit 6  (Blank rows = not tested)  Surgery type/Date: 08/12/24 R breast lumpectomy and SLNB Number of lymph nodes removed: 0/4 Current/past treatment (chemo, radiation, hormone therapy): will require radiation and hormone therapy  Other symptoms:  Heaviness/tightness Yes Pain Yes Pitting edema No Infections No Decreased scar mobility Yes Stemmer sign No  TREATMENT PERFORMED: 09/30/24: THERAPEUTIC EXERCISE: Pulleys x 2 min in direction of flexion and 2 min in direction of abduction Ball up wall x 10 reps in direction of flexion and 10 reps in direction of R shoulder abduction THERAPEUTIC ACTIVITY: In supine: supine scapular series using red theraband x 10 reps each with pt returning therapist demo as follows: narrow and wide grip flexion, horizontal abduction, ER and diagonals with v/c for correct form and to engage scapular muscles Snow angels x10 reps MANUAL THERAPY: MFR to cording in R axilla with cording more difficult to palpate  PROM in supine in to flexion and abduction Gentle STM to seroma in inferior breast as well as MLD moving fluid towards pathway aimed at right lateral trunk towards groin  09/22/24: THERAPEUTIC EXERCISE: Pulleys x 2 min in direction of flexion and 2 min in direction of abduction Ball up wall x 10 reps in direction of flexion and 10 reps in direction of R shoulder abduction THERAPEUTIC ACTIVITY: Supine on mat table: alternating flexion x 10, bilateral scaption x 10, snow angels x 10 MANUAL THERAPY: MFR to cording in R axilla with cording more difficult to palpate  PROM in supine in to flexion and abduction Gentle STM to seroma in inferior breast as well as MLD moving fluid towards pathway aimed at  right lateral trunk towards groin   09/20/24: THERAPEUTIC EXERCISE: Pulleys x 2 min in direction of flexion and 2 min in direction of abduction Ball up wall x 10 reps in direction of flexion and 10 reps in direction of R shoulder abduction MANUAL THERAPY: MFR to cording in R axilla and upper arm with numerous cords palpable with UE in 90 degrees of abduction propped on pillows PROM in supine in to flexion and abduction Gentle STM to seroma in inferior breast as well as MLD moving fluid towards pathway aimed at right lateral trunk towards groin  09/16/24: THERAPEUTIC EXERCISE: Pulleys x 2 min in direction of flexion and 2 min in direction of abduction Ball up wall x 10 reps in direction of flexion and 10 reps in direction of R shoulder abduction THERAPEUTIC ACTIVITY: Supine over 1/2 foam roll: alternating flexion x 10, bilateral scaption x 10, snow angels (very limited motion due to tightness) Supine over 1/2 foam roll: supine scapular series and had her return demo 10 reps of each as follows with yellow band while therapist provided appropriate v/c and t/c: narrow and wide grip flexion, horizontal abduction, ER, bilateral diagonals MANUAL THERAPY: MFR to cording in R axilla and upper arm  with numerous cords palpable with UE in 90 degrees with cording extending past antecubital fossa PROM in supine in to flexion and abduction  09/14/24: THERAPEUTIC EXERCISE: Pulleys x 2 min in direction of flexion and 2 min in direction of abduction Ball up wall x 10 reps in direction of flexion and 10 reps in direction of R shoulder abduction THERAPEUTIC ACTIVITY: Supine over 1/2 foam roll: alternating flexion x 10, bilateral scaption x 10, snow angels (very limited motion due to tightness) Supine over 1/2 foam roll: supine scapular series and had her return demo 10 reps of each as follows with yellow band while therapist provided appropriate v/c and t/c: narrow and wide grip flexion, horizontal abduction,  ER, bilateral diagonals MANUAL THERAPY: MFR to cording in R axilla with numerous cords palpable with UE in 90 degrees of abduction PROM in supine in to flexion, abduction and ER to pt's tolerance with v/c and t/c to keep shoulder relaxed Gentle STM to seroma in inferior breast as well as MLD moving fluid towards pathway aimed at right lateral trunk towards groin  09/09/24: THERAPEUTIC EXERCISE: Pulleys x 2 min in direction of flexion and 2 min in direction of abduction Ball up wall x 10 reps in direction of flexion and 10 reps in direction of R shoulder abduction Instructed pt in supine scapular series and had her return demo 10 reps of each as follows with yellow band while therapist provided appropriate v/c and t/c: narrow and wide grip flexion, horizontal abduction, ER, bilateral diagonals MANUAL THERAPY: MFR to cording in R axilla with numerous cords palpable with UE in 120 degrees of abduction support on pillow with elbow bent PROM in supine in to flexion, abduction and ER to pt's tolerance  09/07/24: THERAPEUTIC EXERCISE: Pulleys x 2 min in direction of flexion and 2 min in direction of abduction Ball up wall x 10 reps in direction of flexion and 10 reps in direction of R shoulder abduction MANUAL THERAPY: MFR to cording in R axilla with numerous cords palpable with UE in 120 degrees of abduction support on pillow with elbow bent Gentle STM to area of fibrosis/seroma in R breast - educated pt to let her radiation dr know about this to be sure it will not interfere with radation  PATIENT EDUCATION:  Education details: ABC class, need for compression bra, axillary cording, seroma vs scar tissue, need for stretching for cording, side bending QL stretch Person educated: Patient Education method: Explanation, Demonstration, and Handouts Education comprehension: verbalized understanding  HOME EXERCISE PROGRAM: Reviewed previously given post op HEP. Wear compression bra Quadratus  lumborum stretch 1-2x/daily holding for 30-60 seconds  ASSESSMENT:  CLINICAL IMPRESSION: Assessed progress towards goals in therapy. Pt is progressing towards all goals. She has met her goals for pain in L flank. She is progressing towards her pain goal from cording. She met her R shoulder ROM goal. She continues to have tightness from the cording as well as discomfort in the area of her seroma/area of fibrosis. She is currently undergoing radiation which generally increases tightness. She would benefit from skilled PT services to address this.   Pt will benefit from skilled therapeutic intervention to improve on the following deficits: Decreased knowledge of precautions, impaired UE functional use, pain, decreased ROM, postural dysfunction.   PT treatment/interventions: ADL/Self care home management, [phone]- PT Re-evaluation, 97110-Therapeutic exercises, 97530- Therapeutic activity, [id number]- Neuromuscular re-education, 97535- Self Care, [id number]- Manual therapy, [phone]- Orthotic Initial, [phone]- Orthotic/Prosthetic subsequent, and Patient/Family education   GOALS: Goals reviewed  with patient? Yes  GOALS MET AT EVAL:  GOALS Name Target Date Goal status  1 Pt will be able to verbalize understanding of pertinent lymphedema risk reduction practices relevant to her dx specifically related to skin care.  Baseline:  No knowledge Eval Achieved at eval  2 Pt will be able to return demo and/or verbalize understanding of the post op HEP related to regaining shoulder ROM. Baseline:  No knowledge Eval Achieved at eval  3 Pt will be able to verbalize understanding of the importance of viewing the post op After Breast CA Class video for further lymphedema risk reduction education and therapeutic exercise.  Baseline:  No knowledge Eval Achieved at eval   LONG TERM GOALS:  (STG=LTG)  GOALS Name Target Date  Goal status  1 Pt will demonstrate she has regained full shoulder ROM and function post operatively  compared to baselines.  Baseline: 09/29/24 INITIAL  2 Pt will demonstrate 155 degrees of R shoulder abduction to allow her to reach out to the side. 09/29/24 INITIAL  3 Pt will report a 75% improvement in L flank pain to allow improved comfort and mobility. 09/29/24 INITIAL  4 Pt will report 75% improvement in pain from cording with R shoulder abduction to allow improved comfort.  09/29/24 INITIAL  5 Pt will be independent in a home exercise program for continued stretching and strengthening.  09/29/24 INITIAL     PLAN:  PT FREQUENCY/DURATION: 2x/wk for 4 weeks  PLAN FOR NEXT SESSION: MLD R breast, pulleys, ball, MFR to cording in R axilla, PROM to R shoulder, STM to area of seroma in inferiror breast   [facility]  [address]  [place] [address]  [phone]   Florina Sever Lannon, PT 09/30/2024, 2:50 PM

## 2024-10-01 ENCOUNTER — Other Ambulatory Visit: Payer: Self-pay

## 2024-10-01 ENCOUNTER — Ambulatory Visit
Admission: RE | Admit: 2024-10-01 | Discharge: 2024-10-01 | Disposition: A | Discharge status: Home / Self Care | Source: Ambulatory Visit | Attending: Radiation Oncology | Admitting: Radiation Oncology

## 2024-10-01 DIAGNOSIS — C50311 Malignant neoplasm of lower-inner quadrant of right female breast: Secondary | ICD-10-CM | POA: Diagnosis not present

## 2024-10-01 LAB — RAD ONC ARIA SESSION SUMMARY
Course Elapsed Days: 8
Plan Fractions Treated to Date: 7
Plan Prescribed Dose Per Fraction: 2.67 Gy
Plan Total Fractions Prescribed: 15
Plan Total Prescribed Dose: 40.05 Gy
Reference Point Dosage Given to Date: 18.69 Gy
Reference Point Session Dosage Given: 2.67 Gy
Session Number: 7

## 2024-10-03 ENCOUNTER — Other Ambulatory Visit: Payer: Self-pay

## 2024-10-03 ENCOUNTER — Ambulatory Visit
Admission: RE | Admit: 2024-10-03 | Discharge: 2024-10-03 | Disposition: A | Discharge status: Home / Self Care | Source: Ambulatory Visit | Attending: Radiation Oncology | Admitting: Radiation Oncology

## 2024-10-03 DIAGNOSIS — C50311 Malignant neoplasm of lower-inner quadrant of right female breast: Secondary | ICD-10-CM | POA: Diagnosis not present

## 2024-10-03 LAB — RAD ONC ARIA SESSION SUMMARY
Course Elapsed Days: 10
Plan Fractions Treated to Date: 8
Plan Prescribed Dose Per Fraction: 2.67 Gy
Plan Total Fractions Prescribed: 15
Plan Total Prescribed Dose: 40.05 Gy
Reference Point Dosage Given to Date: 21.36 Gy
Reference Point Session Dosage Given: 2.67 Gy
Session Number: 8

## 2024-10-04 ENCOUNTER — Other Ambulatory Visit: Payer: Self-pay

## 2024-10-04 ENCOUNTER — Ambulatory Visit: Admitting: Physical Therapy

## 2024-10-04 ENCOUNTER — Ambulatory Visit
Admission: RE | Admit: 2024-10-04 | Discharge: 2024-10-04 | Disposition: A | Discharge status: Home / Self Care | Source: Ambulatory Visit | Attending: Radiation Oncology | Admitting: Radiation Oncology

## 2024-10-04 DIAGNOSIS — C50311 Malignant neoplasm of lower-inner quadrant of right female breast: Secondary | ICD-10-CM | POA: Diagnosis not present

## 2024-10-04 LAB — RAD ONC ARIA SESSION SUMMARY
Course Elapsed Days: 11
Plan Fractions Treated to Date: 9
Plan Prescribed Dose Per Fraction: 2.67 Gy
Plan Total Fractions Prescribed: 15
Plan Total Prescribed Dose: 40.05 Gy
Reference Point Dosage Given to Date: 24.03 Gy
Reference Point Session Dosage Given: 2.67 Gy
Session Number: 9

## 2024-10-05 ENCOUNTER — Ambulatory Visit
Admission: RE | Admit: 2024-10-05 | Discharge: 2024-10-05 | Disposition: A | Source: Ambulatory Visit | Attending: Radiation Oncology | Admitting: Radiation Oncology

## 2024-10-05 ENCOUNTER — Ambulatory Visit (HOSPITAL_COMMUNITY)
Admission: RE | Admit: 2024-10-05 | Discharge: 2024-10-05 | Disposition: A | Discharge status: Home / Self Care | Source: Ambulatory Visit | Attending: Radiation Oncology | Admitting: Radiation Oncology

## 2024-10-05 ENCOUNTER — Other Ambulatory Visit: Payer: Self-pay

## 2024-10-05 DIAGNOSIS — R52 Pain, unspecified: Secondary | ICD-10-CM | POA: Diagnosis present

## 2024-10-05 DIAGNOSIS — C50311 Malignant neoplasm of lower-inner quadrant of right female breast: Secondary | ICD-10-CM | POA: Diagnosis not present

## 2024-10-05 LAB — RAD ONC ARIA SESSION SUMMARY
Course Elapsed Days: 12
Plan Fractions Treated to Date: 10
Plan Prescribed Dose Per Fraction: 2.67 Gy
Plan Total Fractions Prescribed: 15
Plan Total Prescribed Dose: 40.05 Gy
Reference Point Dosage Given to Date: 26.7 Gy
Reference Point Session Dosage Given: 2.67 Gy
Session Number: 10

## 2024-10-06 ENCOUNTER — Ambulatory Visit
Admission: RE | Admit: 2024-10-06 | Discharge: 2024-10-06 | Disposition: A | Discharge status: Home / Self Care | Source: Ambulatory Visit | Attending: Radiation Oncology | Admitting: Radiation Oncology

## 2024-10-06 ENCOUNTER — Telehealth: Payer: Self-pay | Admitting: Genetic Counselor

## 2024-10-06 ENCOUNTER — Other Ambulatory Visit: Payer: Self-pay

## 2024-10-06 DIAGNOSIS — C50311 Malignant neoplasm of lower-inner quadrant of right female breast: Secondary | ICD-10-CM | POA: Diagnosis not present

## 2024-10-06 LAB — RAD ONC ARIA SESSION SUMMARY
Course Elapsed Days: 13
Plan Fractions Treated to Date: 11
Plan Prescribed Dose Per Fraction: 2.67 Gy
Plan Total Fractions Prescribed: 15
Plan Total Prescribed Dose: 40.05 Gy
Reference Point Dosage Given to Date: 29.37 Gy
Reference Point Session Dosage Given: 2.67 Gy
Session Number: 11

## 2024-10-06 NOTE — Telephone Encounter (Signed)
 Called patient back and provided information on how to get her family members tested for the familial PALB2 mutation.  Suggested that they get onto the [employer] site and click the find a genetic counselor so they can find someone in their area.  Explained that her family needs the first 1-2 pages of her report and need to be tested within 90 days of her report so that they can take advantage of the complimentary testing.  Explained that if they miss that window they can still be tested through insurance.  Patient voiced her understanding.  Darice Monte, MS, CGC  Licensed, Patent Attorney [email or web address] phone: [phone]

## 2024-10-11 ENCOUNTER — Other Ambulatory Visit: Payer: Self-pay

## 2024-10-11 ENCOUNTER — Ambulatory Visit
Admission: RE | Admit: 2024-10-11 | Discharge: 2024-10-11 | Disposition: A | Source: Ambulatory Visit | Attending: Radiation Oncology | Admitting: Radiation Oncology

## 2024-10-11 DIAGNOSIS — C50311 Malignant neoplasm of lower-inner quadrant of right female breast: Secondary | ICD-10-CM | POA: Insufficient documentation

## 2024-10-11 DIAGNOSIS — Z170 Estrogen receptor positive status [ER+]: Secondary | ICD-10-CM | POA: Insufficient documentation

## 2024-10-11 LAB — RAD ONC ARIA SESSION SUMMARY
Course Elapsed Days: 18
Plan Fractions Treated to Date: 12
Plan Prescribed Dose Per Fraction: 2.67 Gy
Plan Total Fractions Prescribed: 15
Plan Total Prescribed Dose: 40.05 Gy
Reference Point Dosage Given to Date: 32.04 Gy
Reference Point Session Dosage Given: 2.67 Gy
Session Number: 12

## 2024-10-12 ENCOUNTER — Other Ambulatory Visit: Payer: Self-pay

## 2024-10-12 ENCOUNTER — Ambulatory Visit: Admitting: Radiation Oncology

## 2024-10-12 ENCOUNTER — Ambulatory Visit
Admission: RE | Admit: 2024-10-12 | Discharge: 2024-10-12 | Attending: Radiation Oncology | Admitting: Radiation Oncology

## 2024-10-12 ENCOUNTER — Encounter: Payer: Self-pay | Admitting: Physical Therapy

## 2024-10-12 ENCOUNTER — Ambulatory Visit: Attending: General Surgery | Admitting: Physical Therapy

## 2024-10-12 ENCOUNTER — Ambulatory Visit
Admission: RE | Admit: 2024-10-12 | Discharge: 2024-10-12 | Disposition: A | Source: Ambulatory Visit | Attending: Radiation Oncology | Admitting: Radiation Oncology

## 2024-10-12 DIAGNOSIS — Z170 Estrogen receptor positive status [ER+]: Secondary | ICD-10-CM | POA: Diagnosis present

## 2024-10-12 DIAGNOSIS — R600 Localized edema: Secondary | ICD-10-CM | POA: Insufficient documentation

## 2024-10-12 DIAGNOSIS — R293 Abnormal posture: Secondary | ICD-10-CM | POA: Diagnosis present

## 2024-10-12 DIAGNOSIS — M25611 Stiffness of right shoulder, not elsewhere classified: Secondary | ICD-10-CM | POA: Diagnosis present

## 2024-10-12 DIAGNOSIS — R10A2 Flank pain, left side: Secondary | ICD-10-CM | POA: Diagnosis present

## 2024-10-12 DIAGNOSIS — C50311 Malignant neoplasm of lower-inner quadrant of right female breast: Secondary | ICD-10-CM | POA: Diagnosis present

## 2024-10-12 DIAGNOSIS — M79621 Pain in right upper arm: Secondary | ICD-10-CM | POA: Diagnosis present

## 2024-10-12 DIAGNOSIS — Z483 Aftercare following surgery for neoplasm: Secondary | ICD-10-CM | POA: Insufficient documentation

## 2024-10-12 LAB — RAD ONC ARIA SESSION SUMMARY
Course Elapsed Days: 19
Plan Fractions Treated to Date: 13
Plan Prescribed Dose Per Fraction: 2.67 Gy
Plan Total Fractions Prescribed: 15
Plan Total Prescribed Dose: 40.05 Gy
Reference Point Dosage Given to Date: 34.71 Gy
Reference Point Session Dosage Given: 2.67 Gy
Session Number: 13

## 2024-10-12 MED ORDER — RADIAPLEXRX EX GEL: Freq: Once | CUTANEOUS | Status: AC

## 2024-10-12 NOTE — Therapy (Signed)
 OUTPATIENT PHYSICAL THERAPY BREAST CANCER POST OP FOLLOW UP   Patient Name: Margaret [name] MRN: [id number] DOB:January 22, 1951, 73 y.o., female Today's Date: 10/12/2024  END OF SESSION:  PT End of Session - 10/12/24 1453     Visit Number 10    Number of Visits 17    Date for Recertification  10/28/24    PT Start Time 1403    PT Stop Time 1455    PT Time Calculation (min) 52 min    Activity Tolerance Patient tolerated treatment well    Behavior During Therapy [facility] for tasks assessed/performed            Past Medical History:  Diagnosis Date   Allergy    Anemia    Anxiety    Arthritis    CAD in native artery 11/27/2023   Cancer (HCC)    right breast 2025   Colitis    Dyspareunia    Endometriosis    Family history of breast cancer    GERD (gastroesophageal reflux disease)    High cholesterol    Hx of abnormal Pap smear 1980's   Hypertension    Osteopenia    Primary hypertension 11/27/2013   Ulcer    Urinary incontinence    Past Surgical History:  Procedure Laterality Date   ABDOMINAL HYSTERECTOMY  09/11/2008   TVH/BSO--Dr. Nikki with TVT   BILATERAL SALPINGOOPHORECTOMY     2009   BLADDER SUSPENSION  09/11/2008   Dr. Nikki   BREAST BIOPSY Right 07/09/2024   US  RT BREAST BX W LOC DEV 1ST LESION IMG BX SPEC US  GUIDE 07/09/2024 [facility]   BREAST BIOPSY  08/10/2024   US  RT RADIOACTIVE SEED LOC 08/10/2024 [facility]   BREAST LUMPECTOMY WITH RADIOACTIVE SEED AND SENTINEL LYMPH NODE BIOPSY Right 08/12/2024   Procedure: BREAST LUMPECTOMY WITH RADIOACTIVE SEED AND SENTINEL LYMPH NODE BIOPSY W/MAGTRACE;  Surgeon: Curvin Deward MOULD, MD;  Location: [facility] OR;  Service: General;  Laterality: Right;  GEN w/PEC BLOCK RIGHT BREAST RADIOACTIVE SEED LUMPECTOMY SENTINEL NODE BIOPSY   CERVIX LESION DESTRUCTION  11/11/1978   hx abnormal pap--dysplasia   COLONOSCOPY WITH PROPOFOL  Left 10/25/2017   Procedure: COLONOSCOPY WITH PROPOFOL ;  Surgeon: Saintclair Jasper, MD;   Location: [facility];  Service: Gastroenterology;  Laterality: Left;   Patient Active Problem List   Diagnosis Date Noted   Genetic testing 08/03/2024   Family history of breast cancer    Malignant neoplasm of lower-inner quadrant of right breast of female, estrogen receptor positive (HCC) 07/16/2024   Hyperlipidemia LDL goal <70 03/09/2024   CAD in native artery 11/27/2023   Gastroesophageal reflux disease 06/06/2020   Atypical chest pain 04/21/2020   Closed nondisplaced fracture of proximal phalanx of lesser toe of left foot 11/19/2017   GI bleed 10/23/2017   Hypokalemia 10/23/2017   Nausea vomiting and diarrhea 10/23/2017   Hematochezia 10/23/2017   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 Deward Curvin  REFERRING DIAG: R breast cancer  THERAPY DIAG:  Stiffness of right shoulder, not elsewhere classified  Aftercare following surgery for neoplasm  Localized edema  Pain in right upper arm  Flank pain, left side  Abnormal posture  Malignant neoplasm of lower-inner quadrant of right breast of female, estrogen receptor positive (HCC)  Rationale for Evaluation and Treatment:  Rehabilitation  ONSET DATE: 07/13/24  SUBJECTIVE:                                                                                                                                                                                           SUBJECTIVE STATEMENT: I am getting tired and I am only on day 6 of radiation.   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 It measures 1.1 cm and is located in the lower inner quadrant. It is ER positive, PR negative, and HER2 negative with a Ki67 of 25%. 08/12/24- R breast lumpectomy and SLNB  0/4  PATIENT GOALS:  Reassess how my recovery is going related to arm function, pain, and swelling.  PAIN:  Are you having pain? Yes: NPRS scale: 1-2/10 Pain location: R axilla Pain description: soreness Aggravating factors: reaching with the arm Relieving factors: pt is unsure, staying still, exercises  PRECAUTIONS: Recent Surgery, right UE Lymphedema risk,   RED FLAGS: None   ACTIVITY LEVEL / LEISURE: currently doing post op exercises   OBJECTIVE:   PATIENT SURVEYS:  QUICK DASH:     OBSERVATIONS: Fibrotic area palpable in area of SLNB - feels similar to a seroma, healing lumpectomy scar with glue still intact, post op swelling visible in inferior breast  POSTURE:  Forward head and rounded shoulders posture   UPPER EXTREMITY AROM/PROM:   A/PROM RIGHT   eval   RIGHT 09/01/24 RIGHT 09/16/24  Shoulder extension 43 62   Shoulder flexion 149 148 162  Shoulder abduction 155 130 155  Shoulder internal rotation 79 69 66  Shoulder external rotation 79 80                           (Blank rows = not tested)   A/PROM LEFT   eval  Shoulder extension 53  Shoulder flexion 143  Shoulder abduction 161  Shoulder internal rotation 80  Shoulder external rotation 90                          (Blank rows = not tested)   CERVICAL AROM: All within functional limits   UPPER EXTREMITY STRENGTH: WFL   LYMPHEDEMA ASSESSMENTS (in cm):    LANDMARK RIGHT   eval 09/01/24  10 cm proximal to olecranon process 30.9 30.5  Olecranon process 27.4 27.8  10 cm proximal to ulnar styloid process 24.1 22  Just proximal to ulnar styloid process 16.8 16.5  Across hand at thumb web space 19.4 19.6  At base of  2nd digit 5.9 5.7  (Blank rows = not tested)   LANDMARK LEFT   eval  10 cm proximal to olecranon process 30  Olecranon process 26.9  10 cm proximal to ulnar styloid process 22.4  Just proximal to ulnar styloid process 16.1  Across hand at thumb web space 19  At base of 2nd digit  6  (Blank rows = not tested)  Surgery type/Date: 08/12/24 R breast lumpectomy and SLNB Number of lymph nodes removed: 0/4 Current/past treatment (chemo, radiation, hormone therapy): will require radiation and hormone therapy  Other symptoms:  Heaviness/tightness Yes Pain Yes Pitting edema No Infections No Decreased scar mobility Yes Stemmer sign No  TREATMENT PERFORMED: 10/12/24: THERAPEUTIC EXERCISE: Pulleys x 2 min in direction of flexion and 2 min in direction of abduction Ball up wall x 10 reps in direction of flexion and 10 reps in direction of R shoulder abduction THERAPEUTIC ACTIVITY: In supine: supine scapular series using red theraband x 10 reps each with pt returning therapist demo as follows: narrow and wide grip flexion, horizontal abduction, ER and diagonals with v/c for correct form and to engage scapular muscles MANUAL THERAPY: MFR to cording in R axilla with very thick cord palpable in R axilla extending down upper arm PROM in supine in to flexion and abduction Gentle STM to seroma in inferior breast as well as MLD moving fluid towards pathway aimed at right lateral trunk towards groin  09/30/24: THERAPEUTIC EXERCISE: Pulleys x 2 min in direction of flexion and 2 min in direction of abduction Ball up wall x 10 reps in direction of flexion and 10 reps in direction of R shoulder abduction THERAPEUTIC ACTIVITY: In supine: supine scapular series using red theraband x 10 reps each with pt returning therapist demo as follows: narrow and wide grip flexion, horizontal abduction, ER and diagonals with v/c for correct form and to engage scapular muscles Snow angels x10 reps MANUAL THERAPY: MFR to cording in R axilla with cording more difficult to palpate  PROM in supine in to flexion and abduction Gentle STM to seroma in inferior breast as well as MLD moving fluid towards pathway aimed at right lateral trunk towards groin  09/22/24: THERAPEUTIC EXERCISE: Pulleys x 2 min  in direction of flexion and 2 min in direction of abduction Ball up wall x 10 reps in direction of flexion and 10 reps in direction of R shoulder abduction THERAPEUTIC ACTIVITY: Supine on mat table: alternating flexion x 10, bilateral scaption x 10, snow angels x 10 MANUAL THERAPY: MFR to cording in R axilla with cording more difficult to palpate  PROM in supine in to flexion and abduction Gentle STM to seroma in inferior breast as well as MLD moving fluid towards pathway aimed at right lateral trunk towards groin   09/20/24: THERAPEUTIC EXERCISE: Pulleys x 2 min in direction of flexion and 2 min in direction of abduction Ball up wall x 10 reps in direction of flexion and 10 reps in direction of R shoulder abduction MANUAL THERAPY: MFR to cording in R axilla and upper arm with numerous cords palpable with UE in 90 degrees of abduction propped on pillows PROM in supine in to flexion and abduction Gentle STM to seroma in inferior breast as well as MLD moving fluid towards pathway aimed at right lateral trunk towards groin  09/16/24: THERAPEUTIC EXERCISE: Pulleys x 2 min in direction of flexion and 2 min in direction of abduction Ball up wall x 10 reps in direction of flexion and  10 reps in direction of R shoulder abduction THERAPEUTIC ACTIVITY: Supine over 1/2 foam roll: alternating flexion x 10, bilateral scaption x 10, snow angels (very limited motion due to tightness) Supine over 1/2 foam roll: supine scapular series and had her return demo 10 reps of each as follows with yellow band while therapist provided appropriate v/c and t/c: narrow and wide grip flexion, horizontal abduction, ER, bilateral diagonals MANUAL THERAPY: MFR to cording in R axilla and upper arm with numerous cords palpable with UE in 90 degrees with cording extending past antecubital fossa PROM in supine in to flexion and abduction  09/14/24: THERAPEUTIC EXERCISE: Pulleys x 2 min in direction of flexion and 2 min in  direction of abduction Ball up wall x 10 reps in direction of flexion and 10 reps in direction of R shoulder abduction THERAPEUTIC ACTIVITY: Supine over 1/2 foam roll: alternating flexion x 10, bilateral scaption x 10, snow angels (very limited motion due to tightness) Supine over 1/2 foam roll: supine scapular series and had her return demo 10 reps of each as follows with yellow band while therapist provided appropriate v/c and t/c: narrow and wide grip flexion, horizontal abduction, ER, bilateral diagonals MANUAL THERAPY: MFR to cording in R axilla with numerous cords palpable with UE in 90 degrees of abduction PROM in supine in to flexion, abduction and ER to pt's tolerance with v/c and t/c to keep shoulder relaxed Gentle STM to seroma in inferior breast as well as MLD moving fluid towards pathway aimed at right lateral trunk towards groin  09/09/24: THERAPEUTIC EXERCISE: Pulleys x 2 min in direction of flexion and 2 min in direction of abduction Ball up wall x 10 reps in direction of flexion and 10 reps in direction of R shoulder abduction Instructed pt in supine scapular series and had her return demo 10 reps of each as follows with yellow band while therapist provided appropriate v/c and t/c: narrow and wide grip flexion, horizontal abduction, ER, bilateral diagonals MANUAL THERAPY: MFR to cording in R axilla with numerous cords palpable with UE in 120 degrees of abduction support on pillow with elbow bent PROM in supine in to flexion, abduction and ER to pt's tolerance  09/07/24: THERAPEUTIC EXERCISE: Pulleys x 2 min in direction of flexion and 2 min in direction of abduction Ball up wall x 10 reps in direction of flexion and 10 reps in direction of R shoulder abduction MANUAL THERAPY: MFR to cording in R axilla with numerous cords palpable with UE in 120 degrees of abduction support on pillow with elbow bent Gentle STM to area of fibrosis/seroma in R breast - educated pt to let her  radiation dr know about this to be sure it will not interfere with radation  PATIENT EDUCATION:  Education details: ABC class, need for compression bra, axillary cording, seroma vs scar tissue, need for stretching for cording, side bending QL stretch Person educated: Patient Education method: Explanation, Demonstration, and Handouts Education comprehension: verbalized understanding  HOME EXERCISE PROGRAM: Reviewed previously given post op HEP. Wear compression bra Quadratus lumborum stretch 1-2x/daily holding for 30-60 seconds  ASSESSMENT:  CLINICAL IMPRESSION: Pt fell in the parking lot on her way to radiation and has had increased tightness in her axilla. Explained this could also be a result of radiation. Her cording has worsened since her last appointment so continued with MFR to cording in R axilla. Continued with scapular strengthening to help with posture and decrease tightness. Area of seroma/fibrosis in inferior breast was  more pronounced today so spent time here doing STM but being mindful of radiated skin.   Pt will benefit from skilled therapeutic intervention to improve on the following deficits: Decreased knowledge of precautions, impaired UE functional use, pain, decreased ROM, postural dysfunction.   PT treatment/interventions: ADL/Self care home management, [phone]- PT Re-evaluation, 97110-Therapeutic exercises, 97530- Therapeutic activity, [id number]- Neuromuscular re-education, 97535- Self Care, [id number]- Manual therapy, [phone]- Orthotic Initial, [phone]- Orthotic/Prosthetic subsequent, and Patient/Family education   GOALS: Goals reviewed with patient? Yes  GOALS MET AT EVAL:  GOALS Name Target Date Goal status  1 Pt will be able to verbalize understanding of pertinent lymphedema risk reduction practices relevant to her dx specifically related to skin care.  Baseline:  No knowledge Eval Achieved at eval  2 Pt will be able to return demo and/or verbalize understanding of the post  op HEP related to regaining shoulder ROM. Baseline:  No knowledge Eval Achieved at eval  3 Pt will be able to verbalize understanding of the importance of viewing the post op After Breast CA Class video for further lymphedema risk reduction education and therapeutic exercise.  Baseline:  No knowledge Eval Achieved at eval   LONG TERM GOALS:  (STG=LTG)  GOALS Name Target Date  Goal status  1 Pt will demonstrate she has regained full shoulder ROM and function post operatively compared to baselines.  Baseline: 09/29/24 INITIAL  2 Pt will demonstrate 155 degrees of R shoulder abduction to allow her to reach out to the side. 09/29/24 INITIAL  3 Pt will report a 75% improvement in L flank pain to allow improved comfort and mobility. 09/29/24 INITIAL  4 Pt will report 75% improvement in pain from cording with R shoulder abduction to allow improved comfort.  09/29/24 INITIAL  5 Pt will be independent in a home exercise program for continued stretching and strengthening.  09/29/24 INITIAL     PLAN:  PT FREQUENCY/DURATION: 2x/wk for 4 weeks  PLAN FOR NEXT SESSION: MLD R breast, pulleys, ball, MFR to cording in R axilla, PROM to R shoulder, STM to area of seroma in inferiror breast   [facility]  [address], [address]  [place] [address]  [phone]   Florina Sever [place], PT 10/12/2024, 3:00 PM

## 2024-10-13 ENCOUNTER — Other Ambulatory Visit: Payer: Self-pay

## 2024-10-13 ENCOUNTER — Encounter: Payer: Self-pay | Admitting: Gastroenterology

## 2024-10-13 ENCOUNTER — Ambulatory Visit
Admission: RE | Admit: 2024-10-13 | Discharge: 2024-10-13 | Disposition: A | Source: Ambulatory Visit | Attending: Radiation Oncology | Admitting: Radiation Oncology

## 2024-10-13 DIAGNOSIS — C50311 Malignant neoplasm of lower-inner quadrant of right female breast: Secondary | ICD-10-CM | POA: Diagnosis not present

## 2024-10-13 LAB — RAD ONC ARIA SESSION SUMMARY
Course Elapsed Days: 20
Plan Fractions Treated to Date: 14
Plan Prescribed Dose Per Fraction: 2.67 Gy
Plan Total Fractions Prescribed: 15
Plan Total Prescribed Dose: 40.05 Gy
Reference Point Dosage Given to Date: 37.38 Gy
Reference Point Session Dosage Given: 2.67 Gy
Session Number: 14

## 2024-10-14 ENCOUNTER — Encounter: Payer: Self-pay | Admitting: Gastroenterology

## 2024-10-14 ENCOUNTER — Ambulatory Visit: Admitting: Physical Therapy

## 2024-10-14 ENCOUNTER — Ambulatory Visit
Admission: RE | Admit: 2024-10-14 | Discharge: 2024-10-14 | Disposition: A | Source: Ambulatory Visit | Attending: Radiation Oncology | Admitting: Radiation Oncology

## 2024-10-14 ENCOUNTER — Other Ambulatory Visit: Payer: Self-pay

## 2024-10-14 ENCOUNTER — Encounter: Payer: Self-pay | Admitting: Physical Therapy

## 2024-10-14 DIAGNOSIS — Z483 Aftercare following surgery for neoplasm: Secondary | ICD-10-CM

## 2024-10-14 DIAGNOSIS — R293 Abnormal posture: Secondary | ICD-10-CM

## 2024-10-14 DIAGNOSIS — C50311 Malignant neoplasm of lower-inner quadrant of right female breast: Secondary | ICD-10-CM | POA: Diagnosis not present

## 2024-10-14 DIAGNOSIS — M25611 Stiffness of right shoulder, not elsewhere classified: Secondary | ICD-10-CM

## 2024-10-14 DIAGNOSIS — R600 Localized edema: Secondary | ICD-10-CM

## 2024-10-14 DIAGNOSIS — M79621 Pain in right upper arm: Secondary | ICD-10-CM

## 2024-10-14 DIAGNOSIS — R10A2 Flank pain, left side: Secondary | ICD-10-CM

## 2024-10-14 DIAGNOSIS — Z170 Estrogen receptor positive status [ER+]: Secondary | ICD-10-CM

## 2024-10-14 LAB — RAD ONC ARIA SESSION SUMMARY
Course Elapsed Days: 21
Plan Fractions Treated to Date: 15
Plan Prescribed Dose Per Fraction: 2.67 Gy
Plan Total Fractions Prescribed: 15
Plan Total Prescribed Dose: 40.05 Gy
Reference Point Dosage Given to Date: 40.05 Gy
Reference Point Session Dosage Given: 2.67 Gy
Session Number: 15

## 2024-10-14 NOTE — Therapy (Signed)
 OUTPATIENT PHYSICAL THERAPY BREAST CANCER POST OP FOLLOW UP   Patient Name: Margaret [name] MRN: [id number] DOB:[birth year], 73 y.o., female Today's Date: 10/14/2024  END OF SESSION:  PT End of Session - 10/14/24 1546     Visit Number 11    Number of Visits 17    Date for Recertification  10/28/24    PT Start Time 1502    PT Stop Time 1546   pt requested to leave a little early   PT Time Calculation (min) 44 min    Activity Tolerance Patient tolerated treatment well    Behavior During Therapy [facility] for tasks assessed/performed            Past Medical History:  Diagnosis Date   Allergy    Anemia    Anxiety    Arthritis    CAD in native artery 11/27/2023   Cancer (HCC)    right breast 2025   Colitis    Dyspareunia    Endometriosis    Family history of breast cancer    GERD (gastroesophageal reflux disease)    High cholesterol    Hx of abnormal Pap smear 1980's   Hypertension    Osteopenia    Primary hypertension 11/27/2013   Ulcer    Urinary incontinence    Past Surgical History:  Procedure Laterality Date   ABDOMINAL HYSTERECTOMY  09/11/2008   TVH/BSO--Dr. Nikki with TVT   BILATERAL SALPINGOOPHORECTOMY     2009   BLADDER SUSPENSION  09/11/2008   Dr. Nikki   BREAST BIOPSY Right 07/09/2024   US  RT BREAST BX W LOC DEV 1ST LESION IMG BX SPEC US  GUIDE 07/09/2024 [facility]   BREAST BIOPSY  08/10/2024   US  RT RADIOACTIVE SEED LOC 08/10/2024 [facility]   BREAST LUMPECTOMY WITH RADIOACTIVE SEED AND SENTINEL LYMPH NODE BIOPSY Right 08/12/2024   Procedure: BREAST LUMPECTOMY WITH RADIOACTIVE SEED AND SENTINEL LYMPH NODE BIOPSY W/MAGTRACE;  Surgeon: Curvin Deward MOULD, MD;  Location: [facility] OR;  Service: General;  Laterality: Right;  GEN w/PEC BLOCK RIGHT BREAST RADIOACTIVE SEED LUMPECTOMY SENTINEL NODE BIOPSY   CERVIX LESION DESTRUCTION  11/11/1978   hx abnormal pap--dysplasia   COLONOSCOPY WITH PROPOFOL  Left 10/25/2017   Procedure: COLONOSCOPY WITH  PROPOFOL ;  Surgeon: Saintclair Jasper, MD;  Location: [facility];  Service: Gastroenterology;  Laterality: Left;   Patient Active Problem List   Diagnosis Date Noted   Genetic testing 08/03/2024   Family history of breast cancer    Malignant neoplasm of lower-inner quadrant of right breast of female, estrogen receptor positive (HCC) 07/16/2024   Hyperlipidemia LDL goal <70 03/09/2024   CAD in native artery 11/27/2023   Gastroesophageal reflux disease 06/06/2020   Atypical chest pain 04/21/2020   Closed nondisplaced fracture of proximal phalanx of lesser toe of left foot 11/19/2017   GI bleed 10/23/2017   Hypokalemia 10/23/2017   Nausea vomiting and diarrhea 10/23/2017   Hematochezia 10/23/2017   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 Deward Curvin  REFERRING DIAG: R breast cancer  THERAPY DIAG:  Stiffness of right shoulder, not elsewhere classified  Aftercare following surgery for neoplasm  Localized edema  Pain in right upper arm  Flank pain, left side  Abnormal posture  Malignant neoplasm of lower-inner quadrant of right breast of female, estrogen receptor  positive (HCC)  Rationale for Evaluation and Treatment: Rehabilitation  ONSET DATE: 07/13/24  SUBJECTIVE:                                                                                                                                                                                           SUBJECTIVE STATEMENT: I have some tenderness in my breast. The cording feels a little better today after last session.   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 It measures 1.1 cm and is located in the lower inner quadrant. It is ER positive, PR  negative, and HER2 negative with a Ki67 of 25%. 08/12/24- R breast lumpectomy and SLNB 0/4  PATIENT GOALS:  Reassess how my recovery is going related to arm function, pain, and swelling.  PAIN:  Are you having pain? Yes: NPRS scale: 3/10 Pain location: R axilla Pain description: soreness Aggravating factors: reaching with the arm Relieving factors: pt is unsure, staying still, exercises  PRECAUTIONS: Recent Surgery, right UE Lymphedema risk,   RED FLAGS: None   ACTIVITY LEVEL / LEISURE: currently doing post op exercises   OBJECTIVE:   PATIENT SURVEYS:  QUICK DASH:     OBSERVATIONS: Fibrotic area palpable in area of SLNB - feels similar to a seroma, healing lumpectomy scar with glue still intact, post op swelling visible in inferior breast  POSTURE:  Forward head and rounded shoulders posture   UPPER EXTREMITY AROM/PROM:   A/PROM RIGHT   eval   RIGHT 09/01/24 RIGHT 09/16/24  Shoulder extension 43 62   Shoulder flexion 149 148 162  Shoulder abduction 155 130 155  Shoulder internal rotation 79 69 66  Shoulder external rotation 79 80                           (Blank rows = not tested)   A/PROM LEFT   eval  Shoulder extension 53  Shoulder flexion 143  Shoulder abduction 161  Shoulder internal rotation 80  Shoulder external rotation 90                          (Blank rows = not tested)   CERVICAL AROM: All within functional limits   UPPER EXTREMITY STRENGTH: WFL   LYMPHEDEMA ASSESSMENTS (in cm):    LANDMARK RIGHT   eval 09/01/24  10 cm proximal to olecranon process 30.9 30.5  Olecranon process 27.4 27.8  10 cm proximal to ulnar styloid process 24.1 22  Just proximal to ulnar styloid process 16.8 16.5  Across hand at thumb web space 19.4 19.6  At base of 2nd digit 5.9 5.7  (Blank rows = not tested)   LANDMARK LEFT   eval  10 cm proximal to olecranon process 30  Olecranon process 26.9  10 cm proximal to ulnar styloid process 22.4  Just proximal to  ulnar styloid process 16.1  Across hand at thumb web space 19  At base of 2nd digit 6  (Blank rows = not tested)  Surgery type/Date: 08/12/24 R breast lumpectomy and SLNB Number of lymph nodes removed: 0/4 Current/past treatment (chemo, radiation, hormone therapy): will require radiation and hormone therapy  Other symptoms:  Heaviness/tightness Yes Pain Yes Pitting edema No Infections No Decreased scar mobility Yes Stemmer sign No  TREATMENT PERFORMED: 10/14/24: THERAPEUTIC EXERCISE: Pulleys x 2 min in direction of flexion and 2 min in direction of abduction Ball up wall x 10 reps in direction of flexion and 10 reps in direction of R shoulder abduction THERAPEUTIC ACTIVITY: In supine: supine scapular series using red theraband x 10 reps each with pt returning therapist demo as follows: narrow and wide grip flexion, horizontal abduction, ER and diagonals with v/c for correct form and to engage scapular muscles MANUAL THERAPY: MFR to cording in R axilla with very thick cord palpable in R axilla extending down upper arm PROM in supine in to flexion and abduction Gentle STM to seroma in inferior breast as well as MLD moving fluid towards pathway aimed at right lateral trunk towards groin   10/12/24: THERAPEUTIC EXERCISE: Pulleys x 2 min in direction of flexion and 2 min in direction of abduction Ball up wall x 10 reps in direction of flexion and 10 reps in direction of R shoulder abduction THERAPEUTIC ACTIVITY: In supine: supine scapular series using red theraband x 10 reps each with pt returning therapist demo as follows: narrow and wide grip flexion, horizontal abduction, ER and diagonals with v/c for correct form and to engage scapular muscles MANUAL THERAPY: MFR to cording in R axilla with very thick cord palpable in R axilla extending down upper arm PROM in supine in to flexion and abduction Gentle STM to seroma in inferior breast as well as MLD moving fluid towards pathway aimed  at right lateral trunk towards groin  09/30/24: THERAPEUTIC EXERCISE: Pulleys x 2 min in direction of flexion and 2 min in direction of abduction Ball up wall x 10 reps in direction of flexion and 10 reps in direction of R shoulder abduction THERAPEUTIC ACTIVITY: In supine: supine scapular series using red theraband x 10 reps each with pt returning therapist demo as follows: narrow and wide grip flexion, horizontal abduction, ER and diagonals with v/c for correct form and to engage scapular muscles Snow angels x10 reps MANUAL THERAPY: MFR to cording in R axilla with cording more difficult to palpate  PROM in supine in to flexion and abduction Gentle STM to seroma in inferior breast as well as MLD moving fluid towards pathway aimed at right lateral trunk towards groin  09/22/24: THERAPEUTIC EXERCISE: Pulleys x 2 min in direction of flexion and 2 min in direction of abduction Ball up wall x 10 reps in direction of flexion and 10 reps in direction of R shoulder abduction THERAPEUTIC ACTIVITY: Supine on mat table: alternating flexion x 10, bilateral scaption x 10, snow angels x 10 MANUAL THERAPY: MFR to cording in R axilla with cording more difficult to palpate  PROM in supine in to flexion and abduction Gentle STM to seroma  in inferior breast as well as MLD moving fluid towards pathway aimed at right lateral trunk towards groin   09/20/24: THERAPEUTIC EXERCISE: Pulleys x 2 min in direction of flexion and 2 min in direction of abduction Ball up wall x 10 reps in direction of flexion and 10 reps in direction of R shoulder abduction MANUAL THERAPY: MFR to cording in R axilla and upper arm with numerous cords palpable with UE in 90 degrees of abduction propped on pillows PROM in supine in to flexion and abduction Gentle STM to seroma in inferior breast as well as MLD moving fluid towards pathway aimed at right lateral trunk towards groin  09/16/24: THERAPEUTIC EXERCISE: Pulleys x 2 min  in direction of flexion and 2 min in direction of abduction Ball up wall x 10 reps in direction of flexion and 10 reps in direction of R shoulder abduction THERAPEUTIC ACTIVITY: Supine over 1/2 foam roll: alternating flexion x 10, bilateral scaption x 10, snow angels (very limited motion due to tightness) Supine over 1/2 foam roll: supine scapular series and had her return demo 10 reps of each as follows with yellow band while therapist provided appropriate v/c and t/c: narrow and wide grip flexion, horizontal abduction, ER, bilateral diagonals MANUAL THERAPY: MFR to cording in R axilla and upper arm with numerous cords palpable with UE in 90 degrees with cording extending past antecubital fossa PROM in supine in to flexion and abduction  09/14/24: THERAPEUTIC EXERCISE: Pulleys x 2 min in direction of flexion and 2 min in direction of abduction Ball up wall x 10 reps in direction of flexion and 10 reps in direction of R shoulder abduction THERAPEUTIC ACTIVITY: Supine over 1/2 foam roll: alternating flexion x 10, bilateral scaption x 10, snow angels (very limited motion due to tightness) Supine over 1/2 foam roll: supine scapular series and had her return demo 10 reps of each as follows with yellow band while therapist provided appropriate v/c and t/c: narrow and wide grip flexion, horizontal abduction, ER, bilateral diagonals MANUAL THERAPY: MFR to cording in R axilla with numerous cords palpable with UE in 90 degrees of abduction PROM in supine in to flexion, abduction and ER to pt's tolerance with v/c and t/c to keep shoulder relaxed Gentle STM to seroma in inferior breast as well as MLD moving fluid towards pathway aimed at right lateral trunk towards groin  09/09/24: THERAPEUTIC EXERCISE: Pulleys x 2 min in direction of flexion and 2 min in direction of abduction Ball up wall x 10 reps in direction of flexion and 10 reps in direction of R shoulder abduction Instructed pt in supine  scapular series and had her return demo 10 reps of each as follows with yellow band while therapist provided appropriate v/c and t/c: narrow and wide grip flexion, horizontal abduction, ER, bilateral diagonals MANUAL THERAPY: MFR to cording in R axilla with numerous cords palpable with UE in 120 degrees of abduction support on pillow with elbow bent PROM in supine in to flexion, abduction and ER to pt's tolerance  09/07/24: THERAPEUTIC EXERCISE: Pulleys x 2 min in direction of flexion and 2 min in direction of abduction Ball up wall x 10 reps in direction of flexion and 10 reps in direction of R shoulder abduction MANUAL THERAPY: MFR to cording in R axilla with numerous cords palpable with UE in 120 degrees of abduction support on pillow with elbow bent Gentle STM to area of fibrosis/seroma in R breast - educated pt to let her radiation  dr know about this to be sure it will not interfere with radation  PATIENT EDUCATION:  Education details: ABC class, need for compression bra, axillary cording, seroma vs scar tissue, need for stretching for cording, side bending QL stretch Person educated: Patient Education method: Explanation, Demonstration, and Handouts Education comprehension: verbalized understanding  HOME EXERCISE PROGRAM: Reviewed previously given post op HEP. Wear compression bra Quadratus lumborum stretch 1-2x/daily holding for 30-60 seconds  ASSESSMENT:  CLINICAL IMPRESSION: Pt reports less soreness today from the fall. She is beginning to have some discomfort in her breast from radiation and reports some swelling in her breast. Educated pt that it is most likely inflammation at this point instead of true lymphedema. Continued with strengthening for improved posture and MFR to cording in R axilla. Pt still feels pulling in R axilla near end range of motin in abduction and flexion.   Pt will benefit from skilled therapeutic intervention to improve on the following deficits:  Decreased knowledge of precautions, impaired UE functional use, pain, decreased ROM, postural dysfunction.   PT treatment/interventions: ADL/Self care home management, [phone]- PT Re-evaluation, 97110-Therapeutic exercises, 97530- Therapeutic activity, [id number]- Neuromuscular re-education, 97535- Self Care, [id number]- Manual therapy, [phone]- Orthotic Initial, [phone]- Orthotic/Prosthetic subsequent, and Patient/Family education   GOALS: Goals reviewed with patient? Yes  GOALS MET AT EVAL:  GOALS Name Target Date Goal status  1 Pt will be able to verbalize understanding of pertinent lymphedema risk reduction practices relevant to her dx specifically related to skin care.  Baseline:  No knowledge Eval Achieved at eval  2 Pt will be able to return demo and/or verbalize understanding of the post op HEP related to regaining shoulder ROM. Baseline:  No knowledge Eval Achieved at eval  3 Pt will be able to verbalize understanding of the importance of viewing the post op After Breast CA Class video for further lymphedema risk reduction education and therapeutic exercise.  Baseline:  No knowledge Eval Achieved at eval   LONG TERM GOALS:  (STG=LTG)  GOALS Name Target Date  Goal status  1 Pt will demonstrate she has regained full shoulder ROM and function post operatively compared to baselines.  Baseline: 09/29/24 INITIAL  2 Pt will demonstrate 155 degrees of R shoulder abduction to allow her to reach out to the side. 09/29/24 INITIAL  3 Pt will report a 75% improvement in L flank pain to allow improved comfort and mobility. 09/29/24 INITIAL  4 Pt will report 75% improvement in pain from cording with R shoulder abduction to allow improved comfort.  09/29/24 INITIAL  5 Pt will be independent in a home exercise program for continued stretching and strengthening.  09/29/24 INITIAL     PLAN:  PT FREQUENCY/DURATION: 2x/wk for 4 weeks  PLAN FOR NEXT SESSION: MLD R breast, pulleys, ball, MFR to cording in R  axilla, PROM to R shoulder, STM to area of seroma in inferiror breast   [facility]  [address], [address]  [place] [address]  [phone]   Florina Sever Ross, PT 10/14/2024, 3:57 PM

## 2024-10-15 ENCOUNTER — Other Ambulatory Visit: Payer: Self-pay

## 2024-10-15 ENCOUNTER — Ambulatory Visit
Admission: RE | Admit: 2024-10-15 | Discharge: 2024-10-15 | Disposition: A | Source: Ambulatory Visit | Attending: Radiation Oncology | Admitting: Radiation Oncology

## 2024-10-15 DIAGNOSIS — C50311 Malignant neoplasm of lower-inner quadrant of right female breast: Secondary | ICD-10-CM | POA: Diagnosis not present

## 2024-10-15 LAB — RAD ONC ARIA SESSION SUMMARY
Course Elapsed Days: 22
Plan Fractions Treated to Date: 1
Plan Prescribed Dose Per Fraction: 2 Gy
Plan Total Fractions Prescribed: 6
Plan Total Prescribed Dose: 12 Gy
Reference Point Dosage Given to Date: 2 Gy
Reference Point Session Dosage Given: 2 Gy
Session Number: 16

## 2024-10-16 ENCOUNTER — Inpatient Hospital Stay: Admission: RE | Admit: 2024-10-16 | Discharge: 2024-10-16 | Attending: Gastroenterology

## 2024-10-16 DIAGNOSIS — Z1589 Genetic susceptibility to other disease: Secondary | ICD-10-CM

## 2024-10-16 MED ORDER — GADOPICLENOL 0.5 MMOL/ML IV SOLN
7.0000 mL | Freq: Once | INTRAVENOUS | Status: AC | PRN
  Administered 2024-10-16: 7 mL via INTRAVENOUS

## 2024-10-18 ENCOUNTER — Other Ambulatory Visit: Payer: Self-pay

## 2024-10-18 ENCOUNTER — Ambulatory Visit
Admission: RE | Admit: 2024-10-18 | Discharge: 2024-10-18 | Disposition: A | Source: Ambulatory Visit | Attending: Radiation Oncology | Admitting: Radiation Oncology

## 2024-10-18 DIAGNOSIS — C50311 Malignant neoplasm of lower-inner quadrant of right female breast: Secondary | ICD-10-CM | POA: Diagnosis not present

## 2024-10-18 LAB — RAD ONC ARIA SESSION SUMMARY
Course Elapsed Days: 25
Plan Fractions Treated to Date: 2
Plan Prescribed Dose Per Fraction: 2 Gy
Plan Total Fractions Prescribed: 6
Plan Total Prescribed Dose: 12 Gy
Reference Point Dosage Given to Date: 4 Gy
Reference Point Session Dosage Given: 2 Gy
Session Number: 17

## 2024-10-19 ENCOUNTER — Encounter: Payer: Self-pay | Admitting: Physical Therapy

## 2024-10-19 ENCOUNTER — Ambulatory Visit: Admitting: Physical Therapy

## 2024-10-19 ENCOUNTER — Ambulatory Visit
Admission: RE | Admit: 2024-10-19 | Discharge: 2024-10-19 | Attending: Radiation Oncology | Admitting: Radiation Oncology

## 2024-10-19 ENCOUNTER — Inpatient Hospital Stay: Attending: Internal Medicine | Admitting: Hematology and Oncology

## 2024-10-19 ENCOUNTER — Ambulatory Visit
Admission: RE | Admit: 2024-10-19 | Discharge: 2024-10-19 | Disposition: A | Source: Ambulatory Visit | Attending: Radiation Oncology | Admitting: Radiation Oncology

## 2024-10-19 ENCOUNTER — Other Ambulatory Visit: Payer: Self-pay

## 2024-10-19 VITALS — BP 132/78 | HR 68 | Temp 97.9°F | Resp 18 | Ht 67.0 in | Wt 174.4 lb

## 2024-10-19 DIAGNOSIS — M79621 Pain in right upper arm: Secondary | ICD-10-CM

## 2024-10-19 DIAGNOSIS — N6489 Other specified disorders of breast: Secondary | ICD-10-CM | POA: Insufficient documentation

## 2024-10-19 DIAGNOSIS — Z1722 Progesterone receptor negative status: Secondary | ICD-10-CM | POA: Insufficient documentation

## 2024-10-19 DIAGNOSIS — M8580 Other specified disorders of bone density and structure, unspecified site: Secondary | ICD-10-CM | POA: Insufficient documentation

## 2024-10-19 DIAGNOSIS — Z882 Allergy status to sulfonamides status: Secondary | ICD-10-CM | POA: Insufficient documentation

## 2024-10-19 DIAGNOSIS — Z79899 Other long term (current) drug therapy: Secondary | ICD-10-CM | POA: Insufficient documentation

## 2024-10-19 DIAGNOSIS — Z170 Estrogen receptor positive status [ER+]: Secondary | ICD-10-CM | POA: Diagnosis not present

## 2024-10-19 DIAGNOSIS — Z17411 Hormone receptor positive with human epidermal growth factor receptor 2 negative status: Secondary | ICD-10-CM | POA: Insufficient documentation

## 2024-10-19 DIAGNOSIS — M25611 Stiffness of right shoulder, not elsewhere classified: Secondary | ICD-10-CM | POA: Diagnosis not present

## 2024-10-19 DIAGNOSIS — R600 Localized edema: Secondary | ICD-10-CM

## 2024-10-19 DIAGNOSIS — Z483 Aftercare following surgery for neoplasm: Secondary | ICD-10-CM

## 2024-10-19 DIAGNOSIS — C50311 Malignant neoplasm of lower-inner quadrant of right female breast: Secondary | ICD-10-CM | POA: Insufficient documentation

## 2024-10-19 DIAGNOSIS — Z881 Allergy status to other antibiotic agents status: Secondary | ICD-10-CM | POA: Insufficient documentation

## 2024-10-19 DIAGNOSIS — R10A2 Flank pain, left side: Secondary | ICD-10-CM

## 2024-10-19 DIAGNOSIS — Z880 Allergy status to penicillin: Secondary | ICD-10-CM | POA: Insufficient documentation

## 2024-10-19 DIAGNOSIS — K2950 Unspecified chronic gastritis without bleeding: Secondary | ICD-10-CM | POA: Insufficient documentation

## 2024-10-19 DIAGNOSIS — R293 Abnormal posture: Secondary | ICD-10-CM

## 2024-10-19 LAB — RAD ONC ARIA SESSION SUMMARY
Course Elapsed Days: 26
Plan Fractions Treated to Date: 3
Plan Prescribed Dose Per Fraction: 2 Gy
Plan Total Fractions Prescribed: 6
Plan Total Prescribed Dose: 12 Gy
Reference Point Dosage Given to Date: 6 Gy
Reference Point Session Dosage Given: 2 Gy
Session Number: 18

## 2024-10-19 MED ORDER — ANASTROZOLE 1 MG PO TABS: 1.0000 mg | ORAL_TABLET | Freq: Every day | ORAL | 3 refills | Status: AC

## 2024-10-19 NOTE — Progress Notes (Signed)
 Patient Care Team: [name], MD as PCP - General (Family Medicine) Raford Riggs, MD as PCP - Cardiology (Cardiology) Cathlyn JAYSON Cary, Bobie BRAVO, MD as Consulting Physician (Obstetrics and Gynecology) Gerome, Devere HERO, RN as Oncology Nurse Navigator Tyree Nanetta SAILOR, RN as Oncology Nurse Navigator Curvin Deward MOULD, MD as Consulting Physician (General Surgery) Odean Potts, MD as Consulting Physician (Hematology and Oncology) Shannon Agent, MD as Consulting Physician (Radiation Oncology)  DIAGNOSIS:  Encounter Diagnosis  Name Primary?   Malignant neoplasm of lower-inner quadrant of right breast of female, estrogen receptor positive (HCC) Yes    SUMMARY OF ONCOLOGIC HISTORY: Oncology History  Malignant neoplasm of lower-inner quadrant of right breast of female, estrogen receptor positive (HCC)  07/09/2024 Initial Diagnosis   Bilateral breast pain evaluation: Mammogram detected right breast asymmetry at 5:30 position: 0.8 cm, axilla negative, ultrasound biopsy: Grade 2 IDC with lobular features, no LVI, ER 100%, PR 0%, Ki67 25%, HER2 negative   07/21/2024 Cancer Staging   Staging form: Breast, AJCC 8th Edition - Clinical stage from 07/21/2024: Stage IA (cT1b, cN0, cM0, G2, ER+, PR+, HER2-) - Signed by Odean Potts, MD on 07/21/2024 Stage prefix: Initial diagnosis Histologic grading system: 3 grade system   08/03/2024 Genetic Testing   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  The report date is August 03, 2024.  The CancerNext-Expanded gene panel offered by [facility] and includes sequencing, rearrangement, and RNA analysis for the following 77 genes: AIP, ALK, APC, ATM, BAP1, BARD1, BMPR1A, BRCA1, BRCA2, BRIP1, CDC73, CDH1, CDK4, CDKN1B, CDKN2A, CEBPA, CHEK2, CTNNA1, DDX41, DICER1, ETV6, FH, FLCN, GATA2, LZTR1, MAX, MBD4, MEN1, MET, MLH1, MSH2, MSH3, MSH6, MUTYH, NF1, NF2, NTHL1, PALB2, PHOX2B, PMS2,  POT1, PRKAR1A, PTCH1, PTEN, RAD51C, RAD51D, RB1, RET, RPS20, RUNX1, SDHA, SDHAF2, SDHB, SDHC, SDHD, SMAD4, SMARCA4, SMARCB1, SMARCE1, STK11, SUFU, TMEM127, TP53, TSC1, TSC2, VHL, and WT1 (sequencing and deletion/duplication); AXIN2, CTNNA1, DDX41, EGFR, HOXB13, KIT, MBD4, MITF, MSH3, PDGFRA, POLD1 and POLE (sequencing only); EPCAM and GREM1 (deletion/duplication only). RNA data is routinely analyzed for use in variant interpretation for all genes.    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 She has no new breast symptoms, no masses, and no abnormal bleeding. She feels relieved to be finishing radiation.  She has questions about anastrozole  tolerability, duration, and side effects. She is particularly concerned about arthralgias and vasomotor symptoms given pre-existing osteopenia and baseline joint discomfort in her hands and left leg. She uses daily calcium  and vitamin D  and usually walks for exercise but notes reduced activity recently.  She recently had MRCP for pancreatic evaluation related to gastrointestinal symptoms and PALB2 mutation. 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  Two weeks ago she fell onto her right side and clavicle with ecchymosis and arm discomfort. X-rays reportedly showed no fracture and her symptoms have improved. She denies new or worsening musculoskeletal symptoms beyond baseline arthralgias.  She wishes to resume antioxidant supplements recommended by her naturopath after completing radiation and asks about safety of vitamin C and other antioxidants. She also asks about the safety of increased coffee and tea intake.  She has informed her children and sister about her PALB2 mutation and encouraged them to obtain genetic testing.  Aug 30, 2024: Follow-up after right breast lumpectomy for ER+ IDC, stage IA, with negative margins and nodes. Discussed pathology results, Oncotype DX score of 20 (low risk), and adjuvant treatment plan including radiation therapy and planned anastrozole . Addressed concerns about anastrozole  side effects due to chronic hand pain; referred to rheumatology. Recommended enhanced surveillance due to PALB2 mutation and advised genetic testing for family.     ALLERGIES:  is allergic to amoxicillin -pot clavulanate, ciprofloxacin, macrobid [nitrofurantoin], pantoprazole  sodium, and sulfa antibiotics.  MEDICATIONS:  Current Outpatient Medications  Medication Sig Dispense Refill   acetaminophen  (TYLENOL ) 500 MG tablet Take 500-1,000 mg by mouth every 6 (six) hours as needed (pain.).     azelastine (ASTELIN) 0.1 % nasal spray Place 1-2 sprays into both nostrils 2 (two) times daily as needed for allergies. Use in each nostril as directed     Calcium -Vitamin D -Vitamin K (CHEWABLE CALCIUM  PO) Take 1 tablet by mouth in the morning.     ciclopirox  (PENLAC ) 8 % solution Apply topically at bedtime. Apply  over nail and surrounding skin. Apply daily over previous coat. After seven (7) days, may remove with alcohol and continue cycle. 6.6 mL 3   diclofenac  Sodium (VOLTAREN ) 1 % GEL Apply 1 Application topically 4 (four) times daily as needed (hand/wrist pain.).     ezetimibe  (ZETIA ) 10 MG tablet Take 1 tablet (10 mg total) by mouth daily. 90 tablet 3   Ferrous Sulfate (IRON PO) Take 1 tablet by mouth in the morning.     fluticasone (FLONASE) 50 MCG/ACT nasal spray Place 1 spray into both nostrils daily as needed for allergies or rhinitis.     HAWTHORN PO Take 1 Capful by mouth 3 (three) times a week.     hydrochlorothiazide  (HYDRODIURIL ) 12.5 MG tablet Take 1 tablet (12.5 mg total) by mouth daily. 90 tablet 3   ibuprofen  (ADVIL ) 200 MG tablet Take 400 mg by mouth every 8 (eight) hours as needed (pain.).     nitroGLYCERIN  (NITROSTAT ) 0.4 MG SL tablet Place 1 tablet (0.4 mg total) under the tongue every 5 (five) minutes as needed for chest pain. 30 tablet 3   Omega-3 Fatty Acids (EPA PO) Take 1 capsule by mouth in the morning. Super EPA     omeprazole  (PRILOSEC) 40 MG capsule Take 40 mg by mouth daily before breakfast.     Polyethyl Glycol-Propyl Glycol (SYSTANE) 0.4-0.3 % SOLN Place 1-2 drops into both eyes 3 (three) times daily as needed (dry/irritated eyes.).     Povidone (IVIZIA DRY EYES OP) Place 1 drop into both eyes daily.     rosuvastatin  (CRESTOR ) 5 MG tablet Take 1 tablet by mouth 2 to 3 days per week 90 tablet 3   VITAMIN D   PO Take 1,000 Units by mouth in the morning.     No current facility-administered medications for this visit.    PHYSICAL EXAMINATION: ECOG PERFORMANCE STATUS: 1 - Symptomatic but completely ambulatory  Vitals:   10/19/24 1159  BP: 132/78  Pulse: 68  Resp: 18  Temp: 97.9 F (36.6 C)   Filed Weights   10/19/24 1159  Weight: 174 lb 6.4 oz (79.1 kg)    LABORATORY DATA:  I have reviewed the data as listed    Latest Ref Rng & Units 07/21/2024    1:16 PM  06/17/2024    7:18 PM 05/17/2024    6:25 AM  CMP  Glucose 70 - 99 mg/dL 97  893  882   BUN 8 - 23 mg/dL 15  12  13    Creatinine 0.44 - 1.00 mg/dL 9.28  9.29  9.27   Sodium 135 - 145 mmol/L 141  141  140   Potassium 3.5 - 5.1 mmol/L 3.6  3.5  3.5   Chloride 98 - 111 mmol/L 105  103  101   CO2 22 - 32 mmol/L 31  29  28    Calcium  8.9 - 10.3 mg/dL 9.7  9.5  9.7   Total Protein 6.5 - 8.1 g/dL 7.5  7.3    Total Bilirubin 0.0 - 1.2 mg/dL 0.4  0.2    Alkaline Phos 38 - 126 U/L 80  88    AST 15 - 41 U/L 25  23    ALT 0 - 44 U/L 23  13      Lab Results  Component Value Date   WBC 5.0 07/21/2024   HGB 11.7 (L) 07/21/2024   HCT 37.7 07/21/2024   MCV 82.9 07/21/2024   PLT 256 07/21/2024   NEUTROABS 1.6 (L) 07/21/2024    ASSESSMENT & PLAN:  Malignant neoplasm of lower-inner quadrant of right breast of female, estrogen receptor positive (HCC) 08/12/2024:Right lumpectomy: Grade 2 IDC with focal metaplastic features 1 cm, intermediate grade DCIS, margins are negative, negative for LVI, 0/4 lymph nodes negative, ER 100%, PR 0%, HER2 0 to 1+, Ki-67 25% Oncotype DX score 20 (6% risk of distant recurrence)   Treatment plan: Adjuvant radiation therapy 09/24/2024-10/22/2024 Adjuvant antiestrogen therapy with anastrozole  1 mg daily x 5 to 10 years.   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 Her family will also need to do genetic testing. I sent a message to Dr. Elicia regarding pancreatic cancer surveillance.  Abd MRI: No pancreatic cancer   Return to clinic in 3 months for survivorship care plan visit   No orders of the defined types were placed in this encounter.  The patient has a good understanding of the overall plan. she agrees with it. she will call with any problems that may develop before the next visit here.  I personally spent a total of 30 minutes in the care of the patient today including preparing to see the patient,  getting/reviewing separately obtained history, performing a medically appropriate exam/evaluation, counseling and educating, placing orders, referring and communicating with other health care professionals, documenting clinical information in the EHR, independently interpreting results, communicating results, and coordinating care.   Viinay K Constantin Hillery, MD 10/19/24

## 2024-10-19 NOTE — Therapy (Signed)
 OUTPATIENT PHYSICAL THERAPY BREAST CANCER POST OP FOLLOW UP   Patient Name: Margaret [name] MRN: [id number] DOB:[birth year], 73 y.o., female Today's Date: 10/19/2024  END OF SESSION:  PT End of Session - 10/19/24 1457     Visit Number 12    Number of Visits 17    Date for Recertification  10/28/24    PT Start Time 1402    PT Stop Time 1457    PT Time Calculation (min) 55 min    Activity Tolerance Patient tolerated treatment well    Behavior During Therapy [facility] for tasks assessed/performed             Past Medical History:  Diagnosis Date   Allergy    Anemia    Anxiety    Arthritis    CAD in native artery 11/27/2023   Cancer (HCC)    right breast 2025   Colitis    Dyspareunia    Endometriosis    Family history of breast cancer    GERD (gastroesophageal reflux disease)    High cholesterol    Hx of abnormal Pap smear 1980's   Hypertension    Osteopenia    Primary hypertension 11/27/2013   Ulcer    Urinary incontinence    Past Surgical History:  Procedure Laterality Date   ABDOMINAL HYSTERECTOMY  09/11/2008   TVH/BSO--Dr. Nikki with TVT   BILATERAL SALPINGOOPHORECTOMY     2009   BLADDER SUSPENSION  09/11/2008   Dr. Nikki   BREAST BIOPSY Right 07/09/2024   US  RT BREAST BX W LOC DEV 1ST LESION IMG BX SPEC US  GUIDE 07/09/2024 [facility]   BREAST BIOPSY  08/10/2024   US  RT RADIOACTIVE SEED LOC 08/10/2024 [facility]   BREAST LUMPECTOMY WITH RADIOACTIVE SEED AND SENTINEL LYMPH NODE BIOPSY Right 08/12/2024   Procedure: BREAST LUMPECTOMY WITH RADIOACTIVE SEED AND SENTINEL LYMPH NODE BIOPSY W/MAGTRACE;  Surgeon: Curvin Deward MOULD, MD;  Location: [facility] OR;  Service: General;  Laterality: Right;  GEN w/PEC BLOCK RIGHT BREAST RADIOACTIVE SEED LUMPECTOMY SENTINEL NODE BIOPSY   CERVIX LESION DESTRUCTION  11/11/1978   hx abnormal pap--dysplasia   COLONOSCOPY WITH PROPOFOL  Left 10/25/2017   Procedure: COLONOSCOPY WITH PROPOFOL ;  Surgeon: Saintclair Jasper, MD;   Location: [facility];  Service: Gastroenterology;  Laterality: Left;   Patient Active Problem List   Diagnosis Date Noted   Genetic testing 08/03/2024   Family history of breast cancer    Malignant neoplasm of lower-inner quadrant of right breast of female, estrogen receptor positive (HCC) 07/16/2024   Hyperlipidemia LDL goal <70 03/09/2024   CAD in native artery 11/27/2023   Gastroesophageal reflux disease 06/06/2020   Atypical chest pain 04/21/2020   Closed nondisplaced fracture of proximal phalanx of lesser toe of left foot 11/19/2017   GI bleed 10/23/2017   Hypokalemia 10/23/2017   Nausea vomiting and diarrhea 10/23/2017   Hematochezia 10/23/2017   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 Deward Curvin  REFERRING DIAG: R breast cancer  THERAPY DIAG:  Stiffness of right shoulder, not elsewhere classified  Aftercare following surgery for neoplasm  Localized edema  Pain in right upper arm  Flank pain, left side  Abnormal posture  Malignant neoplasm of lower-inner quadrant of right breast of female, estrogen receptor positive (HCC)  Rationale for  Evaluation and Treatment: Rehabilitation  ONSET DATE: 07/13/24  SUBJECTIVE:                                                                                                                                                                                           SUBJECTIVE STATEMENT: I think I am still having pain from my fall. I have pain in my R clavicle and across my chest.   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 It measures 1.1 cm and is located in the lower inner quadrant. It is ER positive, PR negative, and HER2 negative with a Ki67 of 25%.  08/12/24- R breast lumpectomy and SLNB 0/4  PATIENT GOALS:  Reassess how my recovery is going related to arm function, pain, and swelling.  PAIN:  Are you having pain? Yes: NPRS scale: 6/10 Pain location: R chest and clavicle Pain description: soreness Aggravating factors: moving the arm Relieving factors: resting   PRECAUTIONS: Recent Surgery, right UE Lymphedema risk,   RED FLAGS: None   ACTIVITY LEVEL / LEISURE: currently doing post op exercises   OBJECTIVE:   PATIENT SURVEYS:  QUICK DASH:     OBSERVATIONS: Fibrotic area palpable in area of SLNB - feels similar to a seroma, healing lumpectomy scar with glue still intact, post op swelling visible in inferior breast  POSTURE:  Forward head and rounded shoulders posture   UPPER EXTREMITY AROM/PROM:   A/PROM RIGHT   eval   RIGHT 09/01/24 RIGHT 09/16/24  Shoulder extension 43 62   Shoulder flexion 149 148 162  Shoulder abduction 155 130 155  Shoulder internal rotation 79 69 66  Shoulder external rotation 79 80                           (Blank rows = not tested)   A/PROM LEFT   eval  Shoulder extension 53  Shoulder flexion 143  Shoulder abduction 161  Shoulder internal rotation 80  Shoulder external rotation 90                          (Blank rows = not tested)   CERVICAL AROM: All within functional limits   UPPER EXTREMITY STRENGTH: WFL   LYMPHEDEMA ASSESSMENTS (in cm):    LANDMARK RIGHT   eval 09/01/24  10 cm proximal to olecranon process 30.9 30.5  Olecranon process 27.4 27.8  10 cm proximal to ulnar styloid process 24.1 22  Just proximal to ulnar styloid process 16.8 16.5  Across hand at thumb  web space 19.4 19.6  At base of 2nd digit 5.9 5.7  (Blank rows = not tested)   LANDMARK LEFT   eval  10 cm proximal to olecranon process 30  Olecranon process 26.9  10 cm proximal to ulnar styloid process 22.4  Just proximal to ulnar styloid process 16.1  Across hand at thumb web space 19  At base  of 2nd digit 6  (Blank rows = not tested)  Surgery type/Date: 08/12/24 R breast lumpectomy and SLNB Number of lymph nodes removed: 0/4 Current/past treatment (chemo, radiation, hormone therapy): will require radiation and hormone therapy  Other symptoms:  Heaviness/tightness Yes Pain Yes Pitting edema No Infections No Decreased scar mobility Yes Stemmer sign No  TREATMENT PERFORMED: 10/19/24: THERAPEUTIC EXERCISE: Pulleys x 2 min in direction of flexion and 2 min in direction of abduction Ball up wall x 10 reps in direction of flexion and 10 reps in direction of R shoulder abduction THERAPEUTIC ACTIVITY: In supine on foam roller: supine scapular series using red theraband x 10 reps each with pt returning therapist demo as follows: narrow and wide grip flexion, horizontal abduction, ER and diagonals with v/c for correct form and to engage scapular muscles, arms out in horizontal abduction with 60 sec hold, snow angels x 10 reps MANUAL THERAPY: STM in L sidelying to R upper traps, levator, posterior and lateral cervical muscles, suboccipitals and clavicular origin of pec using cocoa butter with numerous knots palpable and increased muscle tightness and sensitivity that eased by end of session 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  10/14/24: THERAPEUTIC EXERCISE: Pulleys x 2 min in direction of flexion and 2 min in direction of abduction Ball up wall x 10 reps in direction of flexion and 10 reps in direction of R shoulder abduction THERAPEUTIC ACTIVITY: In supine: supine scapular series using red theraband x 10 reps each with pt returning therapist demo as follows: narrow and wide grip flexion, horizontal abduction, ER and diagonals with v/c for correct form and to engage scapular muscles MANUAL THERAPY: MFR to cording in R axilla with very thick cord palpable in R axilla extending down upper arm PROM in supine in to flexion and abduction Gentle STM to seroma in inferior breast as  well as MLD moving fluid towards pathway aimed at right lateral trunk towards groin   10/12/24: THERAPEUTIC EXERCISE: Pulleys x 2 min in direction of flexion and 2 min in direction of abduction Ball up wall x 10 reps in direction of flexion and 10 reps in direction of R shoulder abduction THERAPEUTIC ACTIVITY: In supine: supine scapular series using red theraband x 10 reps each with pt returning therapist demo as follows: narrow and wide grip flexion, horizontal abduction, ER and diagonals with v/c for correct form and to engage scapular muscles MANUAL THERAPY: MFR to cording in R axilla with very thick cord palpable in R axilla extending down upper arm PROM in supine in to flexion and abduction Gentle STM to seroma in inferior breast as well as MLD moving fluid towards pathway aimed at right lateral trunk towards groin  09/30/24: THERAPEUTIC EXERCISE: Pulleys x 2 min in direction of flexion and 2 min in direction of abduction Ball up wall x 10 reps in direction of flexion and 10 reps in direction of R shoulder abduction THERAPEUTIC ACTIVITY: In supine: supine scapular series using red theraband x 10 reps each with pt returning therapist demo as follows: narrow and wide grip flexion, horizontal abduction, ER and diagonals with v/c  for correct form and to engage scapular muscles Snow angels x10 reps MANUAL THERAPY: MFR to cording in R axilla with cording more difficult to palpate  PROM in supine in to flexion and abduction Gentle STM to seroma in inferior breast as well as MLD moving fluid towards pathway aimed at right lateral trunk towards groin  09/22/24: THERAPEUTIC EXERCISE: Pulleys x 2 min in direction of flexion and 2 min in direction of abduction Ball up wall x 10 reps in direction of flexion and 10 reps in direction of R shoulder abduction THERAPEUTIC ACTIVITY: Supine on mat table: alternating flexion x 10, bilateral scaption x 10, snow angels x 10 MANUAL THERAPY: MFR to  cording in R axilla with cording more difficult to palpate  PROM in supine in to flexion and abduction Gentle STM to seroma in inferior breast as well as MLD moving fluid towards pathway aimed at right lateral trunk towards groin   09/20/24: THERAPEUTIC EXERCISE: Pulleys x 2 min in direction of flexion and 2 min in direction of abduction Ball up wall x 10 reps in direction of flexion and 10 reps in direction of R shoulder abduction MANUAL THERAPY: MFR to cording in R axilla and upper arm with numerous cords palpable with UE in 90 degrees of abduction propped on pillows PROM in supine in to flexion and abduction Gentle STM to seroma in inferior breast as well as MLD moving fluid towards pathway aimed at right lateral trunk towards groin  09/16/24: THERAPEUTIC EXERCISE: Pulleys x 2 min in direction of flexion and 2 min in direction of abduction Ball up wall x 10 reps in direction of flexion and 10 reps in direction of R shoulder abduction THERAPEUTIC ACTIVITY: Supine over 1/2 foam roll: alternating flexion x 10, bilateral scaption x 10, snow angels (very limited motion due to tightness) Supine over 1/2 foam roll: supine scapular series and had her return demo 10 reps of each as follows with yellow band while therapist provided appropriate v/c and t/c: narrow and wide grip flexion, horizontal abduction, ER, bilateral diagonals MANUAL THERAPY: MFR to cording in R axilla and upper arm with numerous cords palpable with UE in 90 degrees with cording extending past antecubital fossa PROM in supine in to flexion and abduction  09/14/24: THERAPEUTIC EXERCISE: Pulleys x 2 min in direction of flexion and 2 min in direction of abduction Ball up wall x 10 reps in direction of flexion and 10 reps in direction of R shoulder abduction THERAPEUTIC ACTIVITY: Supine over 1/2 foam roll: alternating flexion x 10, bilateral scaption x 10, snow angels (very limited motion due to tightness) Supine over 1/2 foam  roll: supine scapular series and had her return demo 10 reps of each as follows with yellow band while therapist provided appropriate v/c and t/c: narrow and wide grip flexion, horizontal abduction, ER, bilateral diagonals MANUAL THERAPY: MFR to cording in R axilla with numerous cords palpable with UE in 90 degrees of abduction PROM in supine in to flexion, abduction and ER to pt's tolerance with v/c and t/c to keep shoulder relaxed Gentle STM to seroma in inferior breast as well as MLD moving fluid towards pathway aimed at right lateral trunk towards groin  09/09/24: THERAPEUTIC EXERCISE: Pulleys x 2 min in direction of flexion and 2 min in direction of abduction Ball up wall x 10 reps in direction of flexion and 10 reps in direction of R shoulder abduction Instructed pt in supine scapular series and had her return demo 10 reps  of each as follows with yellow band while therapist provided appropriate v/c and t/c: narrow and wide grip flexion, horizontal abduction, ER, bilateral diagonals MANUAL THERAPY: MFR to cording in R axilla with numerous cords palpable with UE in 120 degrees of abduction support on pillow with elbow bent PROM in supine in to flexion, abduction and ER to pt's tolerance  09/07/24: THERAPEUTIC EXERCISE: Pulleys x 2 min in direction of flexion and 2 min in direction of abduction Ball up wall x 10 reps in direction of flexion and 10 reps in direction of R shoulder abduction MANUAL THERAPY: MFR to cording in R axilla with numerous cords palpable with UE in 120 degrees of abduction support on pillow with elbow bent Gentle STM to area of fibrosis/seroma in R breast - educated pt to let her radiation dr know about this to be sure it will not interfere with radation  PATIENT EDUCATION:  Education details: ABC class, need for compression bra, axillary cording, seroma vs scar tissue, need for stretching for cording, side bending QL stretch Person educated: Patient Education  method: Explanation, Demonstration, and Handouts Education comprehension: verbalized understanding  HOME EXERCISE PROGRAM: Reviewed previously given post op HEP. Wear compression bra Quadratus lumborum stretch 1-2x/daily holding for 30-60 seconds  ASSESSMENT:  CLINICAL IMPRESSION: Pt having increased soreness and pain in R neck, shoulder and chest along clavicle. After AAROM exercises and postural exercises - focused on manual therapy/STM to decrease tightness in these areas. Pt very tender and guarded initially but this did improve by end of session and therapy was able to use more pressure with STM. The worst of her soreness/tightness is in the R lateral neck, upper traps/levator and along pec origin at clavicle.   Pt will benefit from skilled therapeutic intervention to improve on the following deficits: Decreased knowledge of precautions, impaired UE functional use, pain, decreased ROM, postural dysfunction.   PT treatment/interventions: ADL/Self care home management, [phone]- PT Re-evaluation, 97110-Therapeutic exercises, 97530- Therapeutic activity, [id number]- Neuromuscular re-education, 97535- Self Care, [id number]- Manual therapy, [phone]- Orthotic Initial, [phone]- Orthotic/Prosthetic subsequent, and Patient/Family education   GOALS: Goals reviewed with patient? Yes  GOALS MET AT EVAL:  GOALS Name Target Date Goal status  1 Pt will be able to verbalize understanding of pertinent lymphedema risk reduction practices relevant to her dx specifically related to skin care.  Baseline:  No knowledge Eval Achieved at eval  2 Pt will be able to return demo and/or verbalize understanding of the post op HEP related to regaining shoulder ROM. Baseline:  No knowledge Eval Achieved at eval  3 Pt will be able to verbalize understanding of the importance of viewing the post op After Breast CA Class video for further lymphedema risk reduction education and therapeutic exercise.  Baseline:  No knowledge Eval  Achieved at eval   LONG TERM GOALS:  (STG=LTG)  GOALS Name Target Date  Goal status  1 Pt will demonstrate she has regained full shoulder ROM and function post operatively compared to baselines.  Baseline: 09/29/24 INITIAL  2 Pt will demonstrate 155 degrees of R shoulder abduction to allow her to reach out to the side. 09/29/24 INITIAL  3 Pt will report a 75% improvement in L flank pain to allow improved comfort and mobility. 09/29/24 INITIAL  4 Pt will report 75% improvement in pain from cording with R shoulder abduction to allow improved comfort.  09/29/24 INITIAL  5 Pt will be independent in a home exercise program for continued stretching and strengthening.  09/29/24 INITIAL  PLAN:  PT FREQUENCY/DURATION: 2x/wk for 4 weeks  PLAN FOR NEXT SESSION: MLD R breast, pulleys, ball, MFR to cording in R axilla, PROM to R shoulder, STM to area of seroma in inferiror breast   [facility]  [address]  [place] [address]  [phone]   Florina Sever Hatley, PT 10/19/2024, 4:57 PM

## 2024-10-19 NOTE — Assessment & Plan Note (Signed)
 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 Her family will also need to do genetic testing. I sent a message to Dr. Elicia regarding pancreatic cancer surveillance.   Return to clinic in 3 months for survivorship care plan visit

## 2024-10-20 ENCOUNTER — Other Ambulatory Visit: Payer: Self-pay

## 2024-10-20 ENCOUNTER — Ambulatory Visit
Admission: RE | Admit: 2024-10-20 | Discharge: 2024-10-20 | Disposition: A | Discharge status: Home / Self Care | Source: Ambulatory Visit | Attending: Radiation Oncology | Admitting: Radiation Oncology

## 2024-10-20 DIAGNOSIS — C50311 Malignant neoplasm of lower-inner quadrant of right female breast: Secondary | ICD-10-CM | POA: Diagnosis not present

## 2024-10-20 LAB — RAD ONC ARIA SESSION SUMMARY
Course Elapsed Days: 27
Plan Fractions Treated to Date: 4
Plan Prescribed Dose Per Fraction: 2 Gy
Plan Total Fractions Prescribed: 6
Plan Total Prescribed Dose: 12 Gy
Reference Point Dosage Given to Date: 8 Gy
Reference Point Session Dosage Given: 2 Gy
Session Number: 19

## 2024-10-21 ENCOUNTER — Ambulatory Visit
Admission: RE | Admit: 2024-10-21 | Discharge: 2024-10-21 | Disposition: A | Discharge status: Home / Self Care | Source: Ambulatory Visit | Attending: Radiation Oncology | Admitting: Radiation Oncology

## 2024-10-21 ENCOUNTER — Ambulatory Visit: Admitting: Physical Therapy

## 2024-10-21 ENCOUNTER — Encounter: Payer: Self-pay | Admitting: Physical Therapy

## 2024-10-21 ENCOUNTER — Other Ambulatory Visit: Payer: Self-pay

## 2024-10-21 DIAGNOSIS — R10A2 Flank pain, left side: Secondary | ICD-10-CM

## 2024-10-21 DIAGNOSIS — R293 Abnormal posture: Secondary | ICD-10-CM

## 2024-10-21 DIAGNOSIS — R600 Localized edema: Secondary | ICD-10-CM

## 2024-10-21 DIAGNOSIS — M25611 Stiffness of right shoulder, not elsewhere classified: Secondary | ICD-10-CM

## 2024-10-21 DIAGNOSIS — C50311 Malignant neoplasm of lower-inner quadrant of right female breast: Secondary | ICD-10-CM

## 2024-10-21 DIAGNOSIS — M79621 Pain in right upper arm: Secondary | ICD-10-CM

## 2024-10-21 DIAGNOSIS — Z483 Aftercare following surgery for neoplasm: Secondary | ICD-10-CM

## 2024-10-21 LAB — RAD ONC ARIA SESSION SUMMARY
Course Elapsed Days: 28
Plan Fractions Treated to Date: 5
Plan Prescribed Dose Per Fraction: 2 Gy
Plan Total Fractions Prescribed: 6
Plan Total Prescribed Dose: 12 Gy
Reference Point Dosage Given to Date: 10 Gy
Reference Point Session Dosage Given: 2 Gy
Session Number: 20

## 2024-10-21 NOTE — Therapy (Signed)
 OUTPATIENT PHYSICAL THERAPY BREAST CANCER POST OP FOLLOW UP   Patient Name: Margaret [name] MRN: [id number] DOB:Apr 08, 1951, 73 y.o., female Today's Date: 10/21/2024  END OF SESSION:  PT End of Session - 10/21/24 0905     Visit Number 13    Number of Visits 17    Date for Recertification  10/28/24    PT Start Time 0903    PT Stop Time 0956    PT Time Calculation (min) 53 min    Activity Tolerance Patient tolerated treatment well    Behavior During Therapy [facility] for tasks assessed/performed             Past Medical History:  Diagnosis Date   Allergy    Anemia    Anxiety    Arthritis    CAD in native artery 11/27/2023   Cancer (HCC)    right breast 2025   Colitis    Dyspareunia    Endometriosis    Family history of breast cancer    GERD (gastroesophageal reflux disease)    High cholesterol    Hx of abnormal Pap smear 1980's   Hypertension    Osteopenia    Primary hypertension 11/27/2013   Ulcer    Urinary incontinence    Past Surgical History:  Procedure Laterality Date   ABDOMINAL HYSTERECTOMY  09/11/2008   TVH/BSO--Dr. Nikki with TVT   BILATERAL SALPINGOOPHORECTOMY     2009   BLADDER SUSPENSION  09/11/2008   Dr. Nikki   BREAST BIOPSY Right 07/09/2024   US  RT BREAST BX W LOC DEV 1ST LESION IMG BX SPEC US  GUIDE 07/09/2024 [facility]   BREAST BIOPSY  08/10/2024   US  RT RADIOACTIVE SEED LOC 08/10/2024 [facility]   BREAST LUMPECTOMY WITH RADIOACTIVE SEED AND SENTINEL LYMPH NODE BIOPSY Right 08/12/2024   Procedure: BREAST LUMPECTOMY WITH RADIOACTIVE SEED AND SENTINEL LYMPH NODE BIOPSY W/MAGTRACE;  Surgeon: Curvin Deward MOULD, MD;  Location: [facility] OR;  Service: General;  Laterality: Right;  GEN w/PEC BLOCK RIGHT BREAST RADIOACTIVE SEED LUMPECTOMY SENTINEL NODE BIOPSY   CERVIX LESION DESTRUCTION  11/11/1978   hx abnormal pap--dysplasia   COLONOSCOPY WITH PROPOFOL  Left 10/25/2017   Procedure: COLONOSCOPY WITH PROPOFOL ;  Surgeon: Saintclair Jasper, MD;   Location: [facility];  Service: Gastroenterology;  Laterality: Left;   Patient Active Problem List   Diagnosis Date Noted   Genetic testing 08/03/2024   Family history of breast cancer    Malignant neoplasm of lower-inner quadrant of right breast of female, estrogen receptor positive (HCC) 07/16/2024   Hyperlipidemia LDL goal <70 03/09/2024   CAD in native artery 11/27/2023   Gastroesophageal reflux disease 06/06/2020   Atypical chest pain 04/21/2020   Closed nondisplaced fracture of proximal phalanx of lesser toe of left foot 11/19/2017   GI bleed 10/23/2017   Hypokalemia 10/23/2017   Nausea vomiting and diarrhea 10/23/2017   Hematochezia 10/23/2017   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 Deward Curvin  REFERRING DIAG: R breast cancer  THERAPY DIAG:  Stiffness of right shoulder, not elsewhere classified  Aftercare following surgery for neoplasm  Localized edema  Pain in right upper arm  Flank pain, left side  Abnormal posture  Malignant neoplasm of lower-inner quadrant of right breast of female, estrogen receptor positive (HCC)  Rationale for  Evaluation and Treatment: Rehabilitation  ONSET DATE: 07/13/24  SUBJECTIVE:                                                                                                                                                                                           SUBJECTIVE STATEMENT: The pain is doing better today. You helped me a lot last visit.   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 It measures 1.1 cm and is located in the lower inner quadrant. It is ER positive, PR negative, and HER2 negative with a Ki67 of 25%. 08/12/24- R breast lumpectomy  and SLNB 0/4  PATIENT GOALS:  Reassess how my recovery is going related to arm function, pain, and swelling.  PAIN:  Are you having pain? Yes: NPRS scale: 3/10 Pain location: R chest and clavicle Pain description: soreness Aggravating factors: moving the arm Relieving factors: resting   PRECAUTIONS: Recent Surgery, right UE Lymphedema risk,   RED FLAGS: None   ACTIVITY LEVEL / LEISURE: currently doing post op exercises   OBJECTIVE:   PATIENT SURVEYS:  QUICK DASH:     OBSERVATIONS: Fibrotic area palpable in area of SLNB - feels similar to a seroma, healing lumpectomy scar with glue still intact, post op swelling visible in inferior breast  POSTURE:  Forward head and rounded shoulders posture   UPPER EXTREMITY AROM/PROM:   A/PROM RIGHT   eval   RIGHT 09/01/24 RIGHT 09/16/24  Shoulder extension 43 62   Shoulder flexion 149 148 162  Shoulder abduction 155 130 155  Shoulder internal rotation 79 69 66  Shoulder external rotation 79 80                           (Blank rows = not tested)   A/PROM LEFT   eval  Shoulder extension 53  Shoulder flexion 143  Shoulder abduction 161  Shoulder internal rotation 80  Shoulder external rotation 90                          (Blank rows = not tested)   CERVICAL AROM: All within functional limits   UPPER EXTREMITY STRENGTH: WFL   LYMPHEDEMA ASSESSMENTS (in cm):    LANDMARK RIGHT   eval 09/01/24  10 cm proximal to olecranon process 30.9 30.5  Olecranon process 27.4 27.8  10 cm proximal to ulnar styloid process 24.1 22  Just proximal to ulnar styloid process 16.8 16.5  Across hand at thumb web space 19.4 19.6  At base of  2nd digit 5.9 5.7  (Blank rows = not tested)   LANDMARK LEFT   eval  10 cm proximal to olecranon process 30  Olecranon process 26.9  10 cm proximal to ulnar styloid process 22.4  Just proximal to ulnar styloid process 16.1  Across hand at thumb web space 19  At base of 2nd digit 6  (Blank rows =  not tested)  Surgery type/Date: 08/12/24 R breast lumpectomy and SLNB Number of lymph nodes removed: 0/4 Current/past treatment (chemo, radiation, hormone therapy): will require radiation and hormone therapy  Other symptoms:  Heaviness/tightness Yes Pain Yes Pitting edema No Infections No Decreased scar mobility Yes Stemmer sign No  TREATMENT PERFORMED: 10/21/24: THERAPEUTIC EXERCISE: Pulleys x 2 min in direction of flexion and 2 min in direction of abduction Ball up wall x 10 reps in direction of flexion and 10 reps in direction of R shoulder abduction THERAPEUTIC ACTIVITY: In supine on foam roller: supine scapular series using red theraband x 10 reps each with pt returning therapist demo as follows: narrow and wide grip flexion, horizontal abduction, ER and diagonals with v/c for correct form and to engage scapular muscles, arms out in horizontal abduction with 60 sec hold, snow angels x 10 reps MANUAL THERAPY: STM in L sidelying to R upper traps, levator, posterior and lateral cervical muscles, suboccipitals and clavicular origin of pec (no cocoa butter today as pt has radiation at 11), less tightness palpable today and decreased sensitivity  MFR to cording in R axilla with less cording palpable today  10/19/24: THERAPEUTIC EXERCISE: Pulleys x 2 min in direction of flexion and 2 min in direction of abduction Ball up wall x 10 reps in direction of flexion and 10 reps in direction of R shoulder abduction THERAPEUTIC ACTIVITY: In supine on foam roller: supine scapular series using red theraband x 10 reps each with pt returning therapist demo as follows: narrow and wide grip flexion, horizontal abduction, ER and diagonals with v/c for correct form and to engage scapular muscles, arms out in horizontal abduction with 60 sec hold, snow angels x 10 reps MANUAL THERAPY: STM in L sidelying to R upper traps, levator, posterior and lateral cervical muscles, suboccipitals and clavicular origin  of pec using cocoa butter with numerous knots palpable and increased muscle tightness and sensitivity that eased by end of session 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  10/14/24: THERAPEUTIC EXERCISE: Pulleys x 2 min in direction of flexion and 2 min in direction of abduction Ball up wall x 10 reps in direction of flexion and 10 reps in direction of R shoulder abduction THERAPEUTIC ACTIVITY: In supine: supine scapular series using red theraband x 10 reps each with pt returning therapist demo as follows: narrow and wide grip flexion, horizontal abduction, ER and diagonals with v/c for correct form and to engage scapular muscles MANUAL THERAPY: MFR to cording in R axilla with very thick cord palpable in R axilla extending down upper arm PROM in supine in to flexion and abduction Gentle STM to seroma in inferior breast as well as MLD moving fluid towards pathway aimed at right lateral trunk towards groin   10/12/24: THERAPEUTIC EXERCISE: Pulleys x 2 min in direction of flexion and 2 min in direction of abduction Ball up wall x 10 reps in direction of flexion and 10 reps in direction of R shoulder abduction THERAPEUTIC ACTIVITY: In supine: supine scapular series using red theraband x 10 reps each with pt returning therapist demo as follows: narrow and  wide grip flexion, horizontal abduction, ER and diagonals with v/c for correct form and to engage scapular muscles MANUAL THERAPY: MFR to cording in R axilla with very thick cord palpable in R axilla extending down upper arm PROM in supine in to flexion and abduction Gentle STM to seroma in inferior breast as well as MLD moving fluid towards pathway aimed at right lateral trunk towards groin  09/30/24: THERAPEUTIC EXERCISE: Pulleys x 2 min in direction of flexion and 2 min in direction of abduction Ball up wall x 10 reps in direction of flexion and 10 reps in direction of R shoulder abduction THERAPEUTIC ACTIVITY: In supine: supine scapular  series using red theraband x 10 reps each with pt returning therapist demo as follows: narrow and wide grip flexion, horizontal abduction, ER and diagonals with v/c for correct form and to engage scapular muscles Snow angels x10 reps MANUAL THERAPY: MFR to cording in R axilla with cording more difficult to palpate  PROM in supine in to flexion and abduction Gentle STM to seroma in inferior breast as well as MLD moving fluid towards pathway aimed at right lateral trunk towards groin  09/22/24: THERAPEUTIC EXERCISE: Pulleys x 2 min in direction of flexion and 2 min in direction of abduction Ball up wall x 10 reps in direction of flexion and 10 reps in direction of R shoulder abduction THERAPEUTIC ACTIVITY: Supine on mat table: alternating flexion x 10, bilateral scaption x 10, snow angels x 10 MANUAL THERAPY: MFR to cording in R axilla with cording more difficult to palpate  PROM in supine in to flexion and abduction Gentle STM to seroma in inferior breast as well as MLD moving fluid towards pathway aimed at right lateral trunk towards groin   09/20/24: THERAPEUTIC EXERCISE: Pulleys x 2 min in direction of flexion and 2 min in direction of abduction Ball up wall x 10 reps in direction of flexion and 10 reps in direction of R shoulder abduction MANUAL THERAPY: MFR to cording in R axilla and upper arm with numerous cords palpable with UE in 90 degrees of abduction propped on pillows PROM in supine in to flexion and abduction Gentle STM to seroma in inferior breast as well as MLD moving fluid towards pathway aimed at right lateral trunk towards groin  09/16/24: THERAPEUTIC EXERCISE: Pulleys x 2 min in direction of flexion and 2 min in direction of abduction Ball up wall x 10 reps in direction of flexion and 10 reps in direction of R shoulder abduction THERAPEUTIC ACTIVITY: Supine over 1/2 foam roll: alternating flexion x 10, bilateral scaption x 10, snow angels (very limited motion due to  tightness) Supine over 1/2 foam roll: supine scapular series and had her return demo 10 reps of each as follows with yellow band while therapist provided appropriate v/c and t/c: narrow and wide grip flexion, horizontal abduction, ER, bilateral diagonals MANUAL THERAPY: MFR to cording in R axilla and upper arm with numerous cords palpable with UE in 90 degrees with cording extending past antecubital fossa PROM in supine in to flexion and abduction  09/14/24: THERAPEUTIC EXERCISE: Pulleys x 2 min in direction of flexion and 2 min in direction of abduction Ball up wall x 10 reps in direction of flexion and 10 reps in direction of R shoulder abduction THERAPEUTIC ACTIVITY: Supine over 1/2 foam roll: alternating flexion x 10, bilateral scaption x 10, snow angels (very limited motion due to tightness) Supine over 1/2 foam roll: supine scapular series and had her return  demo 10 reps of each as follows with yellow band while therapist provided appropriate v/c and t/c: narrow and wide grip flexion, horizontal abduction, ER, bilateral diagonals MANUAL THERAPY: MFR to cording in R axilla with numerous cords palpable with UE in 90 degrees of abduction PROM in supine in to flexion, abduction and ER to pt's tolerance with v/c and t/c to keep shoulder relaxed Gentle STM to seroma in inferior breast as well as MLD moving fluid towards pathway aimed at right lateral trunk towards groin  09/09/24: THERAPEUTIC EXERCISE: Pulleys x 2 min in direction of flexion and 2 min in direction of abduction Ball up wall x 10 reps in direction of flexion and 10 reps in direction of R shoulder abduction Instructed pt in supine scapular series and had her return demo 10 reps of each as follows with yellow band while therapist provided appropriate v/c and t/c: narrow and wide grip flexion, horizontal abduction, ER, bilateral diagonals MANUAL THERAPY: MFR to cording in R axilla with numerous cords palpable with UE in 120 degrees  of abduction support on pillow with elbow bent PROM in supine in to flexion, abduction and ER to pt's tolerance  09/07/24: THERAPEUTIC EXERCISE: Pulleys x 2 min in direction of flexion and 2 min in direction of abduction Ball up wall x 10 reps in direction of flexion and 10 reps in direction of R shoulder abduction MANUAL THERAPY: MFR to cording in R axilla with numerous cords palpable with UE in 120 degrees of abduction support on pillow with elbow bent Gentle STM to area of fibrosis/seroma in R breast - educated pt to let her radiation dr know about this to be sure it will not interfere with radation  PATIENT EDUCATION:  Education details: ABC class, need for compression bra, axillary cording, seroma vs scar tissue, need for stretching for cording, side bending QL stretch Person educated: Patient Education method: Explanation, Demonstration, and Handouts Education comprehension: verbalized understanding  HOME EXERCISE PROGRAM: Reviewed previously given post op HEP. Wear compression bra Quadratus lumborum stretch 1-2x/daily holding for 30-60 seconds  ASSESSMENT:  CLINICAL IMPRESSION: Pt reports decreased pain and tightness after last session. She was able to tolerate more pressure with STM to R pecs and R neck with less tightness palpable. Pt will complete radiation on Friday. Encouraged pt to keep up with stretches and HEP at home to help with tightness and cording.   Pt will benefit from skilled therapeutic intervention to improve on the following deficits: Decreased knowledge of precautions, impaired UE functional use, pain, decreased ROM, postural dysfunction.   PT treatment/interventions: ADL/Self care home management, [phone]- PT Re-evaluation, 97110-Therapeutic exercises, 97530- Therapeutic activity, [id number]- Neuromuscular re-education, 97535- Self Care, [id number]- Manual therapy, [phone]- Orthotic Initial, [phone]- Orthotic/Prosthetic subsequent, and Patient/Family  education   GOALS: Goals reviewed with patient? Yes  GOALS MET AT EVAL:  GOALS Name Target Date Goal status  1 Pt will be able to verbalize understanding of pertinent lymphedema risk reduction practices relevant to her dx specifically related to skin care.  Baseline:  No knowledge Eval Achieved at eval  2 Pt will be able to return demo and/or verbalize understanding of the post op HEP related to regaining shoulder ROM. Baseline:  No knowledge Eval Achieved at eval  3 Pt will be able to verbalize understanding of the importance of viewing the post op After Breast CA Class video for further lymphedema risk reduction education and therapeutic exercise.  Baseline:  No knowledge Eval Achieved at eval   LONG TERM  GOALS:  (STG=LTG)  GOALS Name Target Date  Goal status  1 Pt will demonstrate she has regained full shoulder ROM and function post operatively compared to baselines.  Baseline: 09/29/24 INITIAL  2 Pt will demonstrate 155 degrees of R shoulder abduction to allow her to reach out to the side. 09/29/24 INITIAL  3 Pt will report a 75% improvement in L flank pain to allow improved comfort and mobility. 09/29/24 INITIAL  4 Pt will report 75% improvement in pain from cording with R shoulder abduction to allow improved comfort.  09/29/24 INITIAL  5 Pt will be independent in a home exercise program for continued stretching and strengthening.  09/29/24 INITIAL     PLAN:  PT FREQUENCY/DURATION: 2x/wk for 4 weeks  PLAN FOR NEXT SESSION: MLD R breast, pulleys, ball, MFR to cording in R axilla, PROM to R shoulder, STM to area of seroma in inferiror breast   [facility]  [address], [address]  [place] [address]  [phone]   Florina Sever [place], PT 10/21/2024, 10:00 AM

## 2024-10-22 ENCOUNTER — Other Ambulatory Visit: Payer: Self-pay

## 2024-10-22 ENCOUNTER — Ambulatory Visit
Admission: RE | Admit: 2024-10-22 | Discharge: 2024-10-22 | Disposition: A | Source: Ambulatory Visit | Attending: Radiation Oncology | Admitting: Radiation Oncology

## 2024-10-22 DIAGNOSIS — C50311 Malignant neoplasm of lower-inner quadrant of right female breast: Secondary | ICD-10-CM | POA: Diagnosis not present

## 2024-10-22 LAB — RAD ONC ARIA SESSION SUMMARY
Course Elapsed Days: 29
Plan Fractions Treated to Date: 6
Plan Prescribed Dose Per Fraction: 2 Gy
Plan Total Fractions Prescribed: 6
Plan Total Prescribed Dose: 12 Gy
Reference Point Dosage Given to Date: 12 Gy
Reference Point Session Dosage Given: 2 Gy
Session Number: 21

## 2024-10-25 ENCOUNTER — Ambulatory Visit

## 2024-10-25 NOTE — Radiation Completion Notes (Signed)
 Patient Name: Margaret [name], Margaret [name] MRN: [id number] Date of Birth: [birth year] Referring Physician: MACKEY CHAD, M.D. Date of Service: 2024-10-25 Radiation Oncologist: Signe Nasuti, M.D. [facility]                             RADIATION ONCOLOGY END OF TREATMENT NOTE     Diagnosis: C50.311 Malignant neoplasm of lower-inner quadrant of right female breast Staging on 2024-07-21: Malignant neoplasm of lower-inner quadrant of right breast of female, estrogen receptor positive (HCC) T=cT1b, N=cN0, M=cM0 Intent: Curative     ==========DELIVERED PLANS==========  First Treatment Date: 2024-09-23 Last Treatment Date: 2024-10-22   Plan Name: Breast_R Site: Breast, Right Technique: 3D Mode: Photon Dose Per Fraction: 2.67 Gy Prescribed Dose (Delivered / Prescribed): 40.05 Gy / 40.05 Gy Prescribed Fxs (Delivered / Prescribed): 15 / 15   Plan Name: Breast_R_Bst Site: Breast, Right Technique: 3D Mode: Photon Dose Per Fraction: 2 Gy Prescribed Dose (Delivered / Prescribed): 12 Gy / 12 Gy Prescribed Fxs (Delivered / Prescribed): 6 / 6     ==========ON TREATMENT VISIT DATES========== 2024-09-28, 2024-10-05, 2024-10-12, 2024-10-19     ==========UPCOMING VISITS========== 01/17/2025 [facility] PL[facility] VISIT [place] Morna Pickle, NP  12/14/2024 [facility] EST PT 15 Janit Mcardle [place], [place]  12/06/2024 [facility] AT [facility] [place] SCREEN Aden Berwyn LABOR, PTA  11/23/2024 [facility] ON[facility] FOLLOW UP 30 Wyatt Leeroy CHRISTELLA DEVONNA  10/26/2024 [facility] [place] [place], [place] L, PT        ==========APPENDIX - ON TREATMENT VISIT NOTES==========   See weekly On Treatment Notes in Epic for details in the Media tab (listed as Progress notes on the On Treatment Visit Dates listed above).

## 2024-10-26 ENCOUNTER — Ambulatory Visit: Admitting: Physical Therapy

## 2024-10-26 ENCOUNTER — Encounter: Payer: Self-pay | Admitting: Physical Therapy

## 2024-10-26 DIAGNOSIS — R293 Abnormal posture: Secondary | ICD-10-CM

## 2024-10-26 DIAGNOSIS — R10A2 Flank pain, left side: Secondary | ICD-10-CM

## 2024-10-26 DIAGNOSIS — M79621 Pain in right upper arm: Secondary | ICD-10-CM

## 2024-10-26 DIAGNOSIS — M25611 Stiffness of right shoulder, not elsewhere classified: Secondary | ICD-10-CM

## 2024-10-26 DIAGNOSIS — R600 Localized edema: Secondary | ICD-10-CM

## 2024-10-26 DIAGNOSIS — C50311 Malignant neoplasm of lower-inner quadrant of right female breast: Secondary | ICD-10-CM

## 2024-10-26 DIAGNOSIS — Z483 Aftercare following surgery for neoplasm: Secondary | ICD-10-CM

## 2024-10-26 NOTE — Therapy (Signed)
 OUTPATIENT PHYSICAL THERAPY BREAST CANCER POST OP FOLLOW UP   Patient Name: Margaret [name] MRN: [id number] DOB:Apr 14, 1951, 73 y.o., female Today's Date: 10/26/2024  END OF SESSION:  PT End of Session - 10/26/24 1107     Visit Number 14    Number of Visits 18    Date for Recertification  11/30/24    PT Start Time 1104    PT Stop Time 1159    PT Time Calculation (min) 55 min    Activity Tolerance Patient tolerated treatment well    Behavior During Therapy [facility] for tasks assessed/performed              Past Medical History:  Diagnosis Date   Allergy    Anemia    Anxiety    Arthritis    CAD in native artery 11/27/2023   Cancer (HCC)    right breast 2025   Colitis    Dyspareunia    Endometriosis    Family history of breast cancer    GERD (gastroesophageal reflux disease)    High cholesterol    Hx of abnormal Pap smear 1980's   Hypertension    Osteopenia    Primary hypertension 11/27/2013   Ulcer    Urinary incontinence    Past Surgical History:  Procedure Laterality Date   ABDOMINAL HYSTERECTOMY  09/11/2008   TVH/BSO--Dr. Nikki with TVT   BILATERAL SALPINGOOPHORECTOMY     2009   BLADDER SUSPENSION  09/11/2008   Dr. Nikki   BREAST BIOPSY Right 07/09/2024   US  RT BREAST BX W LOC DEV 1ST LESION IMG BX SPEC US  GUIDE 07/09/2024 [facility]   BREAST BIOPSY  08/10/2024   US  RT RADIOACTIVE SEED LOC 08/10/2024 [facility]   BREAST LUMPECTOMY WITH RADIOACTIVE SEED AND SENTINEL LYMPH NODE BIOPSY Right 08/12/2024   Procedure: BREAST LUMPECTOMY WITH RADIOACTIVE SEED AND SENTINEL LYMPH NODE BIOPSY W/MAGTRACE;  Surgeon: Curvin Deward MOULD, MD;  Location: [facility] OR;  Service: General;  Laterality: Right;  GEN w/PEC BLOCK RIGHT BREAST RADIOACTIVE SEED LUMPECTOMY SENTINEL NODE BIOPSY   CERVIX LESION DESTRUCTION  11/11/1978   hx abnormal pap--dysplasia   COLONOSCOPY WITH PROPOFOL  Left 10/25/2017   Procedure: COLONOSCOPY WITH PROPOFOL ;  Surgeon: Saintclair Jasper, MD;   Location: [facility];  Service: Gastroenterology;  Laterality: Left;   Patient Active Problem List   Diagnosis Date Noted   Genetic testing 08/03/2024   Family history of breast cancer    Malignant neoplasm of lower-inner quadrant of right breast of female, estrogen receptor positive (HCC) 07/16/2024   Hyperlipidemia LDL goal <70 03/09/2024   CAD in native artery 11/27/2023   Gastroesophageal reflux disease 06/06/2020   Atypical chest pain 04/21/2020   Closed nondisplaced fracture of proximal phalanx of lesser toe of left foot 11/19/2017   GI bleed 10/23/2017   Hypokalemia 10/23/2017   Nausea vomiting and diarrhea 10/23/2017   Hematochezia 10/23/2017   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 Deward Curvin  REFERRING DIAG: R breast cancer  THERAPY DIAG:  Stiffness of right shoulder, not elsewhere classified - Plan: PT plan of care cert/re-cert  Aftercare following surgery for neoplasm - Plan: PT plan of care cert/re-cert  Localized edema - Plan: PT plan of care cert/re-cert  Pain in right upper arm - Plan: PT plan  of care cert/re-cert  Flank pain, left side - Plan: PT plan of care cert/re-cert  Abnormal posture - Plan: PT plan of care cert/re-cert  Malignant neoplasm of lower-inner quadrant of right breast of female, estrogen receptor positive (HCC) - Plan: PT plan of care cert/re-cert  Rationale for Evaluation and Treatment: Rehabilitation  ONSET DATE: 07/13/24  SUBJECTIVE:                                                                                                                                                                                           SUBJECTIVE STATEMENT: The pain has been pretty good. I had a tiny bit of pain from the cording in the R arm. I  swept off the patio and cleaned up a few leaves.   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 It measures 1.1 cm and is located in the lower inner quadrant. It is ER positive, PR negative, and HER2 negative with a Ki67 of 25%. 08/12/24- R breast lumpectomy and SLNB 0/4  PATIENT GOALS:  Reassess how my recovery is going related to arm function, pain, and swelling.  PAIN:  Are you having pain? No  PRECAUTIONS: Recent Surgery, right UE Lymphedema risk,   RED FLAGS: None   ACTIVITY LEVEL / LEISURE: currently doing post op exercises   OBJECTIVE:   PATIENT SURVEYS:  QUICK DASH:     OBSERVATIONS: Fibrotic area palpable in area of SLNB - feels similar to a seroma, healing lumpectomy scar with glue still intact, post op swelling visible in inferior breast  POSTURE:  Forward head and rounded shoulders posture   UPPER EXTREMITY AROM/PROM:   A/PROM RIGHT   eval   RIGHT 09/01/24 RIGHT 09/16/24 RIGHT 10/26/24  Shoulder extension 43 62    Shoulder flexion 149 148 162 159  Shoulder abduction 155 130 155 175  Shoulder internal rotation 79 69 66   Shoulder external rotation 79 80                            (Blank rows = not tested)   A/PROM LEFT   eval  Shoulder extension 53  Shoulder flexion 143  Shoulder abduction 161  Shoulder internal rotation 80  Shoulder external rotation 90                          (Blank rows = not tested)   CERVICAL AROM: All within functional limits   UPPER EXTREMITY STRENGTH: WFL   LYMPHEDEMA ASSESSMENTS (in cm):  LANDMARK RIGHT   eval 09/01/24  10 cm proximal to olecranon process 30.9 30.5  Olecranon process 27.4 27.8  10 cm proximal to ulnar styloid process 24.1 22  Just proximal to ulnar styloid process 16.8 16.5  Across hand at thumb web space 19.4 19.6  At base of 2nd digit 5.9 5.7  (Blank rows = not tested)   LANDMARK LEFT   eval  10 cm proximal to  olecranon process 30  Olecranon process 26.9  10 cm proximal to ulnar styloid process 22.4  Just proximal to ulnar styloid process 16.1  Across hand at thumb web space 19  At base of 2nd digit 6  (Blank rows = not tested)  Surgery type/Date: 08/12/24 R breast lumpectomy and SLNB Number of lymph nodes removed: 0/4 Current/past treatment (chemo, radiation, hormone therapy): will require radiation and hormone therapy  Other symptoms:  Heaviness/tightness Yes Pain Yes Pitting edema No Infections No Decreased scar mobility Yes Stemmer sign No  TREATMENT PERFORMED: 10/26/24: THERAPEUTIC EXERCISE: Pulleys x 2 min in direction of flexion and 2 min in direction of abduction Ball up wall x 10 reps in direction of flexion and 10 reps in direction of R shoulder abduction THERAPEUTIC ACTIVITY: In supine on foam roller: supine scapular series using red theraband x 10 reps each with pt returning therapist demo as follows: narrow and wide grip flexion, horizontal abduction, ER and diagonals with v/c for correct form and to engage scapular muscles, snow angels x 10 reps MANUAL THERAPY: STM in L sidelying to R upper traps, levator, posterior and lateral cervical muscles, suboccipitals and clavicular origin of pec using cocoa butter and wave tool MFR to cording in R axilla with increased tightness noted in inferior axilla  10/21/24: THERAPEUTIC EXERCISE: Pulleys x 2 min in direction of flexion and 2 min in direction of abduction Ball up wall x 10 reps in direction of flexion and 10 reps in direction of R shoulder abduction THERAPEUTIC ACTIVITY: In supine on foam roller: supine scapular series using red theraband x 10 reps each with pt returning therapist demo as follows: narrow and wide grip flexion, horizontal abduction, ER and diagonals with v/c for correct form and to engage scapular muscles, arms out in horizontal abduction with 60 sec hold, snow angels x 10 reps MANUAL THERAPY: STM in L  sidelying to R upper traps, levator, posterior and lateral cervical muscles, suboccipitals and clavicular origin of pec (no cocoa butter today as pt has radiation at 11), less tightness palpable today and decreased sensitivity  MFR to cording in R axilla with less cording palpable today  10/19/24: THERAPEUTIC EXERCISE: Pulleys x 2 min in direction of flexion and 2 min in direction of abduction Ball up wall x 10 reps in direction of flexion and 10 reps in direction of R shoulder abduction THERAPEUTIC ACTIVITY: In supine on foam roller: supine scapular series using red theraband x 10 reps each with pt returning therapist demo as follows: narrow and wide grip flexion, horizontal abduction, ER and diagonals with v/c for correct form and to engage scapular muscles, arms out in horizontal abduction with 60 sec hold, snow angels x 10 reps MANUAL THERAPY: STM in L sidelying to R upper traps, levator, posterior and lateral cervical muscles, suboccipitals and clavicular origin of pec using cocoa butter with numerous knots palpable and increased muscle tightness and sensitivity that eased by end of session 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  10/14/24: THERAPEUTIC EXERCISE: Pulleys x 2 min in direction of  flexion and 2 min in direction of abduction Ball up wall x 10 reps in direction of flexion and 10 reps in direction of R shoulder abduction THERAPEUTIC ACTIVITY: In supine: supine scapular series using red theraband x 10 reps each with pt returning therapist demo as follows: narrow and wide grip flexion, horizontal abduction, ER and diagonals with v/c for correct form and to engage scapular muscles MANUAL THERAPY: MFR to cording in R axilla with very thick cord palpable in R axilla extending down upper arm PROM in supine in to flexion and abduction Gentle STM to seroma in inferior breast as well as MLD moving fluid towards pathway aimed at right lateral trunk towards groin   10/12/24: THERAPEUTIC  EXERCISE: Pulleys x 2 min in direction of flexion and 2 min in direction of abduction Ball up wall x 10 reps in direction of flexion and 10 reps in direction of R shoulder abduction THERAPEUTIC ACTIVITY: In supine: supine scapular series using red theraband x 10 reps each with pt returning therapist demo as follows: narrow and wide grip flexion, horizontal abduction, ER and diagonals with v/c for correct form and to engage scapular muscles MANUAL THERAPY: MFR to cording in R axilla with very thick cord palpable in R axilla extending down upper arm PROM in supine in to flexion and abduction Gentle STM to seroma in inferior breast as well as MLD moving fluid towards pathway aimed at right lateral trunk towards groin  09/30/24: THERAPEUTIC EXERCISE: Pulleys x 2 min in direction of flexion and 2 min in direction of abduction Ball up wall x 10 reps in direction of flexion and 10 reps in direction of R shoulder abduction THERAPEUTIC ACTIVITY: In supine: supine scapular series using red theraband x 10 reps each with pt returning therapist demo as follows: narrow and wide grip flexion, horizontal abduction, ER and diagonals with v/c for correct form and to engage scapular muscles Snow angels x10 reps MANUAL THERAPY: MFR to cording in R axilla with cording more difficult to palpate  PROM in supine in to flexion and abduction Gentle STM to seroma in inferior breast as well as MLD moving fluid towards pathway aimed at right lateral trunk towards groin  09/22/24: THERAPEUTIC EXERCISE: Pulleys x 2 min in direction of flexion and 2 min in direction of abduction Ball up wall x 10 reps in direction of flexion and 10 reps in direction of R shoulder abduction THERAPEUTIC ACTIVITY: Supine on mat table: alternating flexion x 10, bilateral scaption x 10, snow angels x 10 MANUAL THERAPY: MFR to cording in R axilla with cording more difficult to palpate  PROM in supine in to flexion and abduction Gentle STM  to seroma in inferior breast as well as MLD moving fluid towards pathway aimed at right lateral trunk towards groin   09/20/24: THERAPEUTIC EXERCISE: Pulleys x 2 min in direction of flexion and 2 min in direction of abduction Ball up wall x 10 reps in direction of flexion and 10 reps in direction of R shoulder abduction MANUAL THERAPY: MFR to cording in R axilla and upper arm with numerous cords palpable with UE in 90 degrees of abduction propped on pillows PROM in supine in to flexion and abduction Gentle STM to seroma in inferior breast as well as MLD moving fluid towards pathway aimed at right lateral trunk towards groin  09/16/24: THERAPEUTIC EXERCISE: Pulleys x 2 min in direction of flexion and 2 min in direction of abduction Ball up wall x 10 reps in direction  of flexion and 10 reps in direction of R shoulder abduction THERAPEUTIC ACTIVITY: Supine over 1/2 foam roll: alternating flexion x 10, bilateral scaption x 10, snow angels (very limited motion due to tightness) Supine over 1/2 foam roll: supine scapular series and had her return demo 10 reps of each as follows with yellow band while therapist provided appropriate v/c and t/c: narrow and wide grip flexion, horizontal abduction, ER, bilateral diagonals MANUAL THERAPY: MFR to cording in R axilla and upper arm with numerous cords palpable with UE in 90 degrees with cording extending past antecubital fossa PROM in supine in to flexion and abduction  09/14/24: THERAPEUTIC EXERCISE: Pulleys x 2 min in direction of flexion and 2 min in direction of abduction Ball up wall x 10 reps in direction of flexion and 10 reps in direction of R shoulder abduction THERAPEUTIC ACTIVITY: Supine over 1/2 foam roll: alternating flexion x 10, bilateral scaption x 10, snow angels (very limited motion due to tightness) Supine over 1/2 foam roll: supine scapular series and had her return demo 10 reps of each as follows with yellow band while therapist  provided appropriate v/c and t/c: narrow and wide grip flexion, horizontal abduction, ER, bilateral diagonals MANUAL THERAPY: MFR to cording in R axilla with numerous cords palpable with UE in 90 degrees of abduction PROM in supine in to flexion, abduction and ER to pt's tolerance with v/c and t/c to keep shoulder relaxed Gentle STM to seroma in inferior breast as well as MLD moving fluid towards pathway aimed at right lateral trunk towards groin  09/09/24: THERAPEUTIC EXERCISE: Pulleys x 2 min in direction of flexion and 2 min in direction of abduction Ball up wall x 10 reps in direction of flexion and 10 reps in direction of R shoulder abduction Instructed pt in supine scapular series and had her return demo 10 reps of each as follows with yellow band while therapist provided appropriate v/c and t/c: narrow and wide grip flexion, horizontal abduction, ER, bilateral diagonals MANUAL THERAPY: MFR to cording in R axilla with numerous cords palpable with UE in 120 degrees of abduction support on pillow with elbow bent PROM in supine in to flexion, abduction and ER to pt's tolerance  09/07/24: THERAPEUTIC EXERCISE: Pulleys x 2 min in direction of flexion and 2 min in direction of abduction Ball up wall x 10 reps in direction of flexion and 10 reps in direction of R shoulder abduction MANUAL THERAPY: MFR to cording in R axilla with numerous cords palpable with UE in 120 degrees of abduction support on pillow with elbow bent Gentle STM to area of fibrosis/seroma in R breast - educated pt to let her radiation dr know about this to be sure it will not interfere with radation  PATIENT EDUCATION:  Education details: ABC class, need for compression bra, axillary cording, seroma vs scar tissue, need for stretching for cording, side bending QL stretch Person educated: Patient Education method: Explanation, Demonstration, and Handouts Education comprehension: verbalized understanding  HOME EXERCISE  PROGRAM: Reviewed previously given post op HEP. Wear compression bra Quadratus lumborum stretch 1-2x/daily holding for 30-60 seconds  ASSESSMENT:  CLINICAL IMPRESSION: Assessed pt's progress towards goals in therapy. She has met all goals for therapy but is still having discomfort in R axilla when using her arm and at end range so added new goal to address this. Pt has some increase in fibrosis in inferior breast since last session. She would benefit from additional skilled PT servcies at 1x/wk to  continue to work on cording in R axilla to decrease discomfort at end range.   Pt will benefit from skilled therapeutic intervention to improve on the following deficits: Decreased knowledge of precautions, impaired UE functional use, pain, decreased ROM, postural dysfunction.   PT treatment/interventions: ADL/Self care home management, [phone]- PT Re-evaluation, 97110-Therapeutic exercises, 97530- Therapeutic activity, [id number]- Neuromuscular re-education, 97535- Self Care, [id number]- Manual therapy, [phone]- Orthotic Initial, [phone]- Orthotic/Prosthetic subsequent, and Patient/Family education   GOALS: Goals reviewed with patient? Yes  GOALS MET AT EVAL:  GOALS Name Target Date Goal status  1 Pt will be able to verbalize understanding of pertinent lymphedema risk reduction practices relevant to her dx specifically related to skin care.  Baseline:  No knowledge Eval Achieved at eval  2 Pt will be able to return demo and/or verbalize understanding of the post op HEP related to regaining shoulder ROM. Baseline:  No knowledge Eval Achieved at eval  3 Pt will be able to verbalize understanding of the importance of viewing the post op After Breast CA Class video for further lymphedema risk reduction education and therapeutic exercise.  Baseline:  No knowledge Eval Achieved at eval   LONG TERM GOALS:  (STG=LTG)  GOALS Name Target Date  Goal status  1 Pt will demonstrate she has regained full shoulder ROM and  function post operatively compared to baselines.  Baseline: 09/29/24 MET 10/26/24  2 Pt will demonstrate 155 degrees of R shoulder abduction to allow her to reach out to the side. 09/29/24 MET 10/26/24 175 degrees  3 Pt will report a 75% improvement in L flank pain to allow improved comfort and mobility. 09/29/24 MET 10/26/24 - no longer having pain  4 Pt will report 75% improvement in pain from cording with R shoulder abduction to allow improved comfort.  09/29/24 MET 10/26/24 80% improvement  5 Pt will be independent in a home exercise program for continued stretching and strengthening.  09/29/24 ONGOING  6 Pt will have no pain in R axilla at end range to allow improved comfort. 11/23/24 NEW     PLAN:  PT FREQUENCY/DURATION: 1x/wk for 4 weeks started week of Dec 29 due to holidays  PLAN FOR NEXT SESSION: MLD R breast, pulleys, ball, MFR to cording in R axilla, PROM to R shoulder, STM to area of seroma in inferiror breast   [facility]  [address]  [place] [address]  [phone]   Florina Sever [place], PT 10/26/2024, 12:07 PM

## 2024-11-09 ENCOUNTER — Ambulatory Visit: Admitting: Physical Therapy

## 2024-11-09 ENCOUNTER — Encounter: Payer: Self-pay | Admitting: Physical Therapy

## 2024-11-09 DIAGNOSIS — R600 Localized edema: Secondary | ICD-10-CM

## 2024-11-09 DIAGNOSIS — Z170 Estrogen receptor positive status [ER+]: Secondary | ICD-10-CM

## 2024-11-09 DIAGNOSIS — R10A2 Flank pain, left side: Secondary | ICD-10-CM

## 2024-11-09 DIAGNOSIS — Z483 Aftercare following surgery for neoplasm: Secondary | ICD-10-CM

## 2024-11-09 DIAGNOSIS — M79621 Pain in right upper arm: Secondary | ICD-10-CM

## 2024-11-09 DIAGNOSIS — M25611 Stiffness of right shoulder, not elsewhere classified: Secondary | ICD-10-CM | POA: Diagnosis not present

## 2024-11-09 DIAGNOSIS — R293 Abnormal posture: Secondary | ICD-10-CM

## 2024-11-09 NOTE — Therapy (Signed)
 " OUTPATIENT PHYSICAL THERAPY BREAST CANCER POST OP FOLLOW UP   Patient Name: Margaret [name] MRN: [id number] DOB:Jun 02, 1951, 73 y.o., female Today's Date: 11/09/2024  END OF SESSION:  PT End of Session - 11/09/24 1205     Visit Number 15    Number of Visits 18    Date for Recertification  11/30/24    PT Start Time 1204    PT Stop Time 1300    PT Time Calculation (min) 56 min    Activity Tolerance Patient tolerated treatment well    Behavior During Therapy [facility] for tasks assessed/performed              Past Medical History:  Diagnosis Date   Allergy    Anemia    Anxiety    Arthritis    CAD in native artery 11/27/2023   Cancer (HCC)    right breast 2025   Colitis    Dyspareunia    Endometriosis    Family history of breast cancer    GERD (gastroesophageal reflux disease)    High cholesterol    Hx of abnormal Pap smear 1980's   Hypertension    Osteopenia    Primary hypertension 11/27/2013   Ulcer    Urinary incontinence    Past Surgical History:  Procedure Laterality Date   ABDOMINAL HYSTERECTOMY  09/11/2008   TVH/BSO--Dr. Nikki with TVT   BILATERAL SALPINGOOPHORECTOMY     2009   BLADDER SUSPENSION  09/11/2008   Dr. Nikki   BREAST BIOPSY Right 07/09/2024   US  RT BREAST BX W LOC DEV 1ST LESION IMG BX SPEC US  GUIDE 07/09/2024 [facility]   BREAST BIOPSY  08/10/2024   US  RT RADIOACTIVE SEED LOC 08/10/2024 [facility]   BREAST LUMPECTOMY WITH RADIOACTIVE SEED AND SENTINEL LYMPH NODE BIOPSY Right 08/12/2024   Procedure: BREAST LUMPECTOMY WITH RADIOACTIVE SEED AND SENTINEL LYMPH NODE BIOPSY W/MAGTRACE;  Surgeon: Curvin Deward MOULD, MD;  Location: [facility] OR;  Service: General;  Laterality: Right;  GEN w/PEC BLOCK RIGHT BREAST RADIOACTIVE SEED LUMPECTOMY SENTINEL NODE BIOPSY   CERVIX LESION DESTRUCTION  11/11/1978   hx abnormal pap--dysplasia   COLONOSCOPY WITH PROPOFOL  Left 10/25/2017   Procedure: COLONOSCOPY WITH PROPOFOL ;  Surgeon: Saintclair Jasper, MD;   Location: [facility];  Service: Gastroenterology;  Laterality: Left;   Patient Active Problem List   Diagnosis Date Noted   Genetic testing 08/03/2024   Family history of breast cancer    Malignant neoplasm of lower-inner quadrant of right breast of female, estrogen receptor positive (HCC) 07/16/2024   Hyperlipidemia LDL goal <70 03/09/2024   CAD in native artery 11/27/2023   Gastroesophageal reflux disease 06/06/2020   Atypical chest pain 04/21/2020   Closed nondisplaced fracture of proximal phalanx of lesser toe of left foot 11/19/2017   GI bleed 10/23/2017   Hypokalemia 10/23/2017   Nausea vomiting and diarrhea 10/23/2017   Hematochezia 10/23/2017   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 Deward Curvin  REFERRING DIAG: R breast cancer  THERAPY DIAG:  Stiffness of right shoulder, not elsewhere classified  Aftercare following surgery for neoplasm  Localized edema  Pain in right upper arm  Flank pain, left side  Abnormal posture  Malignant neoplasm of lower-inner quadrant of right breast of female, estrogen receptor positive (HCC)  Rationale for Evaluation and Treatment: Rehabilitation  ONSET DATE: 07/13/24  SUBJECTIVE:                                                                                                                                                                                           SUBJECTIVE STATEMENT: I had some pain from the cording it might have been from cording. I also have had some tingling in my breast.   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 It measures 1.1 cm and is located in the lower inner quadrant. It is ER positive, PR negative, and HER2 negative with  a Ki67 of 25%. 08/12/24- R breast lumpectomy and SLNB 0/4  PATIENT GOALS:  Reassess how my recovery is going related to arm function, pain, and swelling.  PAIN:  Are you having pain? Yes: NPRS scale: 3-4/10 Pain location: R axilla Pain description: aching at lateral breast/axilla Aggravating factors: stretch too much Relieving factors: sleep incorrectly or too much lifting  PRECAUTIONS: Recent Surgery, right UE Lymphedema risk,   RED FLAGS: None   ACTIVITY LEVEL / LEISURE: currently doing post op exercises   OBJECTIVE:   PATIENT SURVEYS:  QUICK DASH:     OBSERVATIONS: Fibrotic area palpable in area of SLNB - feels similar to a seroma, healing lumpectomy scar with glue still intact, post op swelling visible in inferior breast  POSTURE:  Forward head and rounded shoulders posture   UPPER EXTREMITY AROM/PROM:   A/PROM RIGHT   eval   RIGHT 09/01/24 RIGHT 09/16/24 RIGHT 10/26/24  Shoulder extension 43 62    Shoulder flexion 149 148 162 159  Shoulder abduction 155 130 155 175  Shoulder internal rotation 79 69 66   Shoulder external rotation 79 80                            (Blank rows = not tested)   A/PROM LEFT   eval  Shoulder extension 53  Shoulder flexion 143  Shoulder abduction 161  Shoulder internal rotation 80  Shoulder external rotation 90                          (Blank rows = not tested)   CERVICAL AROM: All within functional limits   UPPER EXTREMITY STRENGTH: WFL   LYMPHEDEMA ASSESSMENTS (in cm):    LANDMARK RIGHT   eval 09/01/24  10 cm proximal to olecranon process 30.9 30.5  Olecranon process 27.4 27.8  10 cm proximal to ulnar styloid process 24.1  22  Just proximal to ulnar styloid process 16.8 16.5  Across hand at thumb web space 19.4 19.6  At base of 2nd digit 5.9 5.7  (Blank rows = not tested)   LANDMARK LEFT   eval  10 cm proximal to olecranon process 30  Olecranon process 26.9  10 cm proximal to ulnar styloid process 22.4  Just  proximal to ulnar styloid process 16.1  Across hand at thumb web space 19  At base of 2nd digit 6  (Blank rows = not tested)  Surgery type/Date: 08/12/24 R breast lumpectomy and SLNB Number of lymph nodes removed: 0/4 Current/past treatment (chemo, radiation, hormone therapy): will require radiation and hormone therapy  Other symptoms:  Heaviness/tightness Yes Pain Yes Pitting edema No Infections No Decreased scar mobility Yes Stemmer sign No  TREATMENT PERFORMED: 11/09/24: THERAPEUTIC EXERCISE: Pulleys x 2 min in direction of flexion and 2 min in direction of abduction Ball up wall x 10 reps in direction of flexion and 10 reps in direction of R shoulder abduction THERAPEUTIC ACTIVITY: In supine on foam roller: supine scapular series using green theraband x 10 reps each with pt returning therapist demo as follows: narrow and wide grip flexion, horizontal abduction, ER and diagonals with v/c for correct form and to engage scapular muscles, snow angels x 10 reps MANUAL THERAPY: MFR to cording in R axilla with increased tightness noted in inferior axilla and cording from medial axilla to medial upper arm  10/26/24: THERAPEUTIC EXERCISE: Pulleys x 2 min in direction of flexion and 2 min in direction of abduction Ball up wall x 10 reps in direction of flexion and 10 reps in direction of R shoulder abduction THERAPEUTIC ACTIVITY: In supine on foam roller: supine scapular series using red theraband x 10 reps each with pt returning therapist demo as follows: narrow and wide grip flexion, horizontal abduction, ER and diagonals with v/c for correct form and to engage scapular muscles, snow angels x 10 reps MANUAL THERAPY: STM in L sidelying to R upper traps, levator, posterior and lateral cervical muscles, suboccipitals and clavicular origin of pec using cocoa butter and wave tool MFR to cording in R axilla with increased tightness noted in inferior axilla  10/21/24: THERAPEUTIC  EXERCISE: Pulleys x 2 min in direction of flexion and 2 min in direction of abduction Ball up wall x 10 reps in direction of flexion and 10 reps in direction of R shoulder abduction THERAPEUTIC ACTIVITY: In supine on foam roller: supine scapular series using red theraband x 10 reps each with pt returning therapist demo as follows: narrow and wide grip flexion, horizontal abduction, ER and diagonals with v/c for correct form and to engage scapular muscles, arms out in horizontal abduction with 60 sec hold, snow angels x 10 reps MANUAL THERAPY: STM in L sidelying to R upper traps, levator, posterior and lateral cervical muscles, suboccipitals and clavicular origin of pec (no cocoa butter today as pt has radiation at 11), less tightness palpable today and decreased sensitivity  MFR to cording in R axilla with less cording palpable today  10/19/24: THERAPEUTIC EXERCISE: Pulleys x 2 min in direction of flexion and 2 min in direction of abduction Ball up wall x 10 reps in direction of flexion and 10 reps in direction of R shoulder abduction THERAPEUTIC ACTIVITY: In supine on foam roller: supine scapular series using red theraband x 10 reps each with pt returning therapist demo as follows: narrow and wide grip flexion, horizontal abduction, ER and diagonals with  v/c for correct form and to engage scapular muscles, arms out in horizontal abduction with 60 sec hold, snow angels x 10 reps MANUAL THERAPY: STM in L sidelying to R upper traps, levator, posterior and lateral cervical muscles, suboccipitals and clavicular origin of pec using cocoa butter with numerous knots palpable and increased muscle tightness and sensitivity that eased by end of session 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  10/14/24: THERAPEUTIC EXERCISE: Pulleys x 2 min in direction of flexion and 2 min in direction of abduction Ball up wall x 10 reps in direction of flexion and 10 reps in direction of R shoulder abduction THERAPEUTIC  ACTIVITY: In supine: supine scapular series using red theraband x 10 reps each with pt returning therapist demo as follows: narrow and wide grip flexion, horizontal abduction, ER and diagonals with v/c for correct form and to engage scapular muscles MANUAL THERAPY: MFR to cording in R axilla with very thick cord palpable in R axilla extending down upper arm PROM in supine in to flexion and abduction Gentle STM to seroma in inferior breast as well as MLD moving fluid towards pathway aimed at right lateral trunk towards groin   10/12/24: THERAPEUTIC EXERCISE: Pulleys x 2 min in direction of flexion and 2 min in direction of abduction Ball up wall x 10 reps in direction of flexion and 10 reps in direction of R shoulder abduction THERAPEUTIC ACTIVITY: In supine: supine scapular series using red theraband x 10 reps each with pt returning therapist demo as follows: narrow and wide grip flexion, horizontal abduction, ER and diagonals with v/c for correct form and to engage scapular muscles MANUAL THERAPY: MFR to cording in R axilla with very thick cord palpable in R axilla extending down upper arm PROM in supine in to flexion and abduction Gentle STM to seroma in inferior breast as well as MLD moving fluid towards pathway aimed at right lateral trunk towards groin  09/30/24: THERAPEUTIC EXERCISE: Pulleys x 2 min in direction of flexion and 2 min in direction of abduction Ball up wall x 10 reps in direction of flexion and 10 reps in direction of R shoulder abduction THERAPEUTIC ACTIVITY: In supine: supine scapular series using red theraband x 10 reps each with pt returning therapist demo as follows: narrow and wide grip flexion, horizontal abduction, ER and diagonals with v/c for correct form and to engage scapular muscles Snow angels x10 reps MANUAL THERAPY: MFR to cording in R axilla with cording more difficult to palpate  PROM in supine in to flexion and abduction Gentle STM to seroma in  inferior breast as well as MLD moving fluid towards pathway aimed at right lateral trunk towards groin  09/22/24: THERAPEUTIC EXERCISE: Pulleys x 2 min in direction of flexion and 2 min in direction of abduction Ball up wall x 10 reps in direction of flexion and 10 reps in direction of R shoulder abduction THERAPEUTIC ACTIVITY: Supine on mat table: alternating flexion x 10, bilateral scaption x 10, snow angels x 10 MANUAL THERAPY: MFR to cording in R axilla with cording more difficult to palpate  PROM in supine in to flexion and abduction Gentle STM to seroma in inferior breast as well as MLD moving fluid towards pathway aimed at right lateral trunk towards groin   09/20/24: THERAPEUTIC EXERCISE: Pulleys x 2 min in direction of flexion and 2 min in direction of abduction Ball up wall x 10 reps in direction of flexion and 10 reps in direction of R shoulder abduction  MANUAL THERAPY: MFR to cording in R axilla and upper arm with numerous cords palpable with UE in 90 degrees of abduction propped on pillows PROM in supine in to flexion and abduction Gentle STM to seroma in inferior breast as well as MLD moving fluid towards pathway aimed at right lateral trunk towards groin  09/16/24: THERAPEUTIC EXERCISE: Pulleys x 2 min in direction of flexion and 2 min in direction of abduction Ball up wall x 10 reps in direction of flexion and 10 reps in direction of R shoulder abduction THERAPEUTIC ACTIVITY: Supine over 1/2 foam roll: alternating flexion x 10, bilateral scaption x 10, snow angels (very limited motion due to tightness) Supine over 1/2 foam roll: supine scapular series and had her return demo 10 reps of each as follows with yellow band while therapist provided appropriate v/c and t/c: narrow and wide grip flexion, horizontal abduction, ER, bilateral diagonals MANUAL THERAPY: MFR to cording in R axilla and upper arm with numerous cords palpable with UE in 90 degrees with cording extending  past antecubital fossa PROM in supine in to flexion and abduction  09/14/24: THERAPEUTIC EXERCISE: Pulleys x 2 min in direction of flexion and 2 min in direction of abduction Ball up wall x 10 reps in direction of flexion and 10 reps in direction of R shoulder abduction THERAPEUTIC ACTIVITY: Supine over 1/2 foam roll: alternating flexion x 10, bilateral scaption x 10, snow angels (very limited motion due to tightness) Supine over 1/2 foam roll: supine scapular series and had her return demo 10 reps of each as follows with yellow band while therapist provided appropriate v/c and t/c: narrow and wide grip flexion, horizontal abduction, ER, bilateral diagonals MANUAL THERAPY: MFR to cording in R axilla with numerous cords palpable with UE in 90 degrees of abduction PROM in supine in to flexion, abduction and ER to pt's tolerance with v/c and t/c to keep shoulder relaxed Gentle STM to seroma in inferior breast as well as MLD moving fluid towards pathway aimed at right lateral trunk towards groin  09/09/24: THERAPEUTIC EXERCISE: Pulleys x 2 min in direction of flexion and 2 min in direction of abduction Ball up wall x 10 reps in direction of flexion and 10 reps in direction of R shoulder abduction Instructed pt in supine scapular series and had her return demo 10 reps of each as follows with yellow band while therapist provided appropriate v/c and t/c: narrow and wide grip flexion, horizontal abduction, ER, bilateral diagonals MANUAL THERAPY: MFR to cording in R axilla with numerous cords palpable with UE in 120 degrees of abduction support on pillow with elbow bent PROM in supine in to flexion, abduction and ER to pt's tolerance  09/07/24: THERAPEUTIC EXERCISE: Pulleys x 2 min in direction of flexion and 2 min in direction of abduction Ball up wall x 10 reps in direction of flexion and 10 reps in direction of R shoulder abduction MANUAL THERAPY: MFR to cording in R axilla with numerous cords  palpable with UE in 120 degrees of abduction support on pillow with elbow bent Gentle STM to area of fibrosis/seroma in R breast - educated pt to let her radiation dr know about this to be sure it will not interfere with radation  PATIENT EDUCATION:  Education details: ABC class, need for compression bra, axillary cording, seroma vs scar tissue, need for stretching for cording, side bending QL stretch Person educated: Patient Education method: Explanation, Demonstration, and Handouts Education comprehension: verbalized understanding  HOME EXERCISE PROGRAM:  Reviewed previously given post op HEP. Wear compression bra Quadratus lumborum stretch 1-2x/daily holding for 30-60 seconds  ASSESSMENT:  CLINICAL IMPRESSION: Increased resistance for supine scapular exercises to green theraband which pt found challenging. Cording was worse today compared to last session. Her breast is more swollen today so will instruct pt in breast MLD at next session.   Pt will benefit from skilled therapeutic intervention to improve on the following deficits: Decreased knowledge of precautions, impaired UE functional use, pain, decreased ROM, postural dysfunction.   PT treatment/interventions: ADL/Self care home management, [phone]- PT Re-evaluation, 97110-Therapeutic exercises, 97530- Therapeutic activity, [id number]- Neuromuscular re-education, 97535- Self Care, [id number]- Manual therapy, [phone]- Orthotic Initial, [phone]- Orthotic/Prosthetic subsequent, and Patient/Family education   GOALS: Goals reviewed with patient? Yes  GOALS MET AT EVAL:  GOALS Name Target Date Goal status  1 Pt will be able to verbalize understanding of pertinent lymphedema risk reduction practices relevant to her dx specifically related to skin care.  Baseline:  No knowledge Eval Achieved at eval  2 Pt will be able to return demo and/or verbalize understanding of the post op HEP related to regaining shoulder ROM. Baseline:  No knowledge Eval  Achieved at eval  3 Pt will be able to verbalize understanding of the importance of viewing the post op After Breast CA Class video for further lymphedema risk reduction education and therapeutic exercise.  Baseline:  No knowledge Eval Achieved at eval   LONG TERM GOALS:  (STG=LTG)  GOALS Name Target Date  Goal status  1 Pt will demonstrate she has regained full shoulder ROM and function post operatively compared to baselines.  Baseline: 09/29/24 MET 10/26/24  2 Pt will demonstrate 155 degrees of R shoulder abduction to allow her to reach out to the side. 09/29/24 MET 10/26/24 175 degrees  3 Pt will report a 75% improvement in L flank pain to allow improved comfort and mobility. 09/29/24 MET 10/26/24 - no longer having pain  4 Pt will report 75% improvement in pain from cording with R shoulder abduction to allow improved comfort.  09/29/24 MET 10/26/24 80% improvement  5 Pt will be independent in a home exercise program for continued stretching and strengthening.  09/29/24 ONGOING  6 Pt will have no pain in R axilla at end range to allow improved comfort. 11/23/24 NEW     PLAN:  PT FREQUENCY/DURATION: 1x/wk for 4 weeks started week of Dec 29 due to holidays  PLAN FOR NEXT SESSION: instruct in MLD R breast, pulleys, ball, MFR to cording in R axilla, PROM to R shoulder, STM to area of seroma in inferiror breast   [facility]  [address], [address]  [place] [address]  [phone]   Florina Sever [place], PT 11/09/2024, 1:02 PM "

## 2024-11-17 ENCOUNTER — Ambulatory Visit: Attending: General Surgery | Admitting: Physical Therapy

## 2024-11-17 ENCOUNTER — Encounter: Payer: Self-pay | Admitting: Physical Therapy

## 2024-11-17 DIAGNOSIS — R293 Abnormal posture: Secondary | ICD-10-CM | POA: Insufficient documentation

## 2024-11-17 DIAGNOSIS — R10A2 Flank pain, left side: Secondary | ICD-10-CM | POA: Diagnosis present

## 2024-11-17 DIAGNOSIS — M25611 Stiffness of right shoulder, not elsewhere classified: Secondary | ICD-10-CM | POA: Insufficient documentation

## 2024-11-17 DIAGNOSIS — C50311 Malignant neoplasm of lower-inner quadrant of right female breast: Secondary | ICD-10-CM | POA: Insufficient documentation

## 2024-11-17 DIAGNOSIS — Z483 Aftercare following surgery for neoplasm: Secondary | ICD-10-CM | POA: Insufficient documentation

## 2024-11-17 DIAGNOSIS — Z170 Estrogen receptor positive status [ER+]: Secondary | ICD-10-CM | POA: Diagnosis present

## 2024-11-17 DIAGNOSIS — R600 Localized edema: Secondary | ICD-10-CM | POA: Insufficient documentation

## 2024-11-17 DIAGNOSIS — M79621 Pain in right upper arm: Secondary | ICD-10-CM | POA: Insufficient documentation

## 2024-11-17 NOTE — Patient Instructions (Signed)
 Self manual lymph drainage: Perform this sequence once a day.  Only give enough pressure no your skin to make the skin move.  Diaphragmatic - Supine   Inhale through nose making navel move out toward hands. Exhale through puckered lips, hands follow navel in. Repeat _5__ times. Rest _10__ seconds between repeats.   Copyright  VHI. All rights reserved.  Hug yourself.  Do circles at your neck just above your collarbones.  Repeat this 10 times.  Axilla - One at a Time   Using full weight of flat hand and fingers at center of uninvolved (Left) armpit, make _10__ in-place circles.   Copyright  VHI. All rights reserved.  LEG: Inguinal Nodes Stimulation   With small finger side of hand against hip crease on involved (Right) side, gently perform circles at the crease. Repeat __10_ times.   Copyright  VHI. All rights reserved.  Axilla to Inguinal Nodes - Sweep   On involved side, stretch skin _4__ times from armpit along side of trunk to hip crease.  Now gently stretch skin from the involved side to the uninvolved side across the chest at the shoulder line.  Repeat that 4 times.  Draw an imaginary diagonal line from upper outer breast through the nipple area toward lower inner breast.  Direct fluid upward and inward from this line toward the pathway across your upper chest .  Do this in three rows to treat all of the upper inner breast tissue, and do each row 3-4x.      Direct fluid to treat all of lower outer breast tissue downward and outward toward pathway that is aimed at the right groin.  Finish by doing the pathways as described above going from your involved armpit to the same side groin and going across your upper chest from the involved shoulder to the uninvolved shoulder.  Repeat the steps above where you do circles in your right groin and left armpit. Copyright  VHI. All rights reserved.

## 2024-11-17 NOTE — Therapy (Signed)
 " OUTPATIENT PHYSICAL THERAPY BREAST CANCER POST OP FOLLOW UP   Patient Name: Margaret [name] MRN: [id number] DOB:[birth year], 74 y.o., female Today's Date: 11/17/2024  END OF SESSION:  PT End of Session - 11/17/24 1153     Visit Number 16    Number of Visits 18    Date for Recertification  11/30/24    PT Start Time 1103    PT Stop Time 1155    PT Time Calculation (min) 52 min    Activity Tolerance Patient tolerated treatment well    Behavior During Therapy [facility] for tasks assessed/performed              Past Medical History:  Diagnosis Date   Allergy    Anemia    Anxiety    Arthritis    CAD in native artery 11/27/2023   Cancer (HCC)    right breast 2025   Colitis    Dyspareunia    Endometriosis    Family history of breast cancer    GERD (gastroesophageal reflux disease)    High cholesterol    Hx of abnormal Pap smear 1980's   Hypertension    Osteopenia    Primary hypertension 11/27/2013   Ulcer    Urinary incontinence    Past Surgical History:  Procedure Laterality Date   ABDOMINAL HYSTERECTOMY  09/11/2008   TVH/BSO--Dr. Nikki with TVT   BILATERAL SALPINGOOPHORECTOMY     2009   BLADDER SUSPENSION  09/11/2008   Dr. Nikki   BREAST BIOPSY Right 07/09/2024   US  RT BREAST BX W LOC DEV 1ST LESION IMG BX SPEC US  GUIDE 07/09/2024 [facility]   BREAST BIOPSY  08/10/2024   US  RT RADIOACTIVE SEED LOC 08/10/2024 [facility]   BREAST LUMPECTOMY WITH RADIOACTIVE SEED AND SENTINEL LYMPH NODE BIOPSY Right 08/12/2024   Procedure: BREAST LUMPECTOMY WITH RADIOACTIVE SEED AND SENTINEL LYMPH NODE BIOPSY W/MAGTRACE;  Surgeon: Curvin Deward MOULD, MD;  Location: [facility] OR;  Service: General;  Laterality: Right;  GEN w/PEC BLOCK RIGHT BREAST RADIOACTIVE SEED LUMPECTOMY SENTINEL NODE BIOPSY   CERVIX LESION DESTRUCTION  11/11/1978   hx abnormal pap--dysplasia   COLONOSCOPY WITH PROPOFOL  Left 10/25/2017   Procedure: COLONOSCOPY WITH PROPOFOL ;  Surgeon: Saintclair Jasper, MD;   Location: [facility];  Service: Gastroenterology;  Laterality: Left;   Patient Active Problem List   Diagnosis Date Noted   Genetic testing 08/03/2024   Family history of breast cancer    Malignant neoplasm of lower-inner quadrant of right breast of female, estrogen receptor positive (HCC) 07/16/2024   Hyperlipidemia LDL goal <70 03/09/2024   CAD in native artery 11/27/2023   Gastroesophageal reflux disease 06/06/2020   Atypical chest pain 04/21/2020   Closed nondisplaced fracture of proximal phalanx of lesser toe of left foot 11/19/2017   GI bleed 10/23/2017   Hypokalemia 10/23/2017   Nausea vomiting and diarrhea 10/23/2017   Hematochezia 10/23/2017   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 Deward Curvin  REFERRING DIAG: R breast cancer  THERAPY DIAG:  Stiffness of right shoulder, not elsewhere classified  Aftercare following surgery for neoplasm  Localized edema  Pain in right upper arm  Flank pain, left side  Abnormal posture  Malignant neoplasm of lower-inner quadrant of right breast of female, estrogen receptor positive (HCC)  Rationale for Evaluation and Treatment: Rehabilitation  ONSET DATE: 07/13/24  SUBJECTIVE:                                                                                                                                                                                           SUBJECTIVE STATEMENT: My pain is doing better. I have just a little in my arm and my breast.   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 It measures 1.1 cm and is located in the lower inner quadrant. It is ER positive, PR negative, and HER2 negative with a Ki67 of 25%. 08/12/24- R breast  lumpectomy and SLNB 0/4  PATIENT GOALS:  Reassess how my recovery is going related to arm function, pain, and swelling.  PAIN:  Are you having pain? Yes: NPRS scale: 2/10 Pain location: R axilla Pain description: aching at lateral breast/axilla Aggravating factors: stretch too much Relieving factors: sleep incorrectly or too much lifting  PRECAUTIONS: Recent Surgery, right UE Lymphedema risk,   RED FLAGS: None   ACTIVITY LEVEL / LEISURE: currently doing post op exercises   OBJECTIVE:   PATIENT SURVEYS:  QUICK DASH:     OBSERVATIONS: Fibrotic area palpable in area of SLNB - feels similar to a seroma, healing lumpectomy scar with glue still intact, post op swelling visible in inferior breast  POSTURE:  Forward head and rounded shoulders posture   UPPER EXTREMITY AROM/PROM:   A/PROM RIGHT   eval   RIGHT 09/01/24 RIGHT 09/16/24 RIGHT 10/26/24  Shoulder extension 43 62    Shoulder flexion 149 148 162 159  Shoulder abduction 155 130 155 175  Shoulder internal rotation 79 69 66   Shoulder external rotation 79 80                            (Blank rows = not tested)   A/PROM LEFT   eval  Shoulder extension 53  Shoulder flexion 143  Shoulder abduction 161  Shoulder internal rotation 80  Shoulder external rotation 90                          (Blank rows = not tested)   CERVICAL AROM: All within functional limits   UPPER EXTREMITY STRENGTH: WFL   LYMPHEDEMA ASSESSMENTS (in cm):    LANDMARK RIGHT   eval 09/01/24  10 cm proximal to olecranon process 30.9 30.5  Olecranon process 27.4 27.8  10 cm proximal to ulnar styloid process 24.1 22  Just proximal to ulnar  styloid process 16.8 16.5  Across hand at thumb web space 19.4 19.6  At base of 2nd digit 5.9 5.7  (Blank rows = not tested)   LANDMARK LEFT   eval  10 cm proximal to olecranon process 30  Olecranon process 26.9  10 cm proximal to ulnar styloid process 22.4  Just proximal to ulnar styloid process  16.1  Across hand at thumb web space 19  At base of 2nd digit 6  (Blank rows = not tested)  Surgery type/Date: 08/12/24 R breast lumpectomy and SLNB Number of lymph nodes removed: 0/4 Current/past treatment (chemo, radiation, hormone therapy): will require radiation and hormone therapy  Other symptoms:  Heaviness/tightness Yes Pain Yes Pitting edema No Infections No Decreased scar mobility Yes Stemmer sign No  TREATMENT PERFORMED: 11/17/24: THERAPEUTIC EXERCISE: Pulleys x 2 min in direction of flexion and 2 min in direction of abduction Ball up wall x 10 reps in direction of flexion and 10 reps in direction of R shoulder abduction MANUAL THERAPY: Instructed pt in self MLD for R breast as follows: In supine: Short neck, 5 diaphragmatic breaths, L axillary nodes and establishment of interaxillary pathway, R inguinal nodes and establishment of axilloinguinal pathway, then R breast moving fluid towards pathways spending extra time in any areas of fibrosis then retracing all steps. Had pt return demonstrate each step and provided v/c and t/c for pressure, speed, and skin stretch technique with pt demonstrating improved technique by end of session.    11/09/24: THERAPEUTIC EXERCISE: Pulleys x 2 min in direction of flexion and 2 min in direction of abduction Ball up wall x 10 reps in direction of flexion and 10 reps in direction of R shoulder abduction THERAPEUTIC ACTIVITY: In supine on foam roller: supine scapular series using green theraband x 10 reps each with pt returning therapist demo as follows: narrow and wide grip flexion, horizontal abduction, ER and diagonals with v/c for correct form and to engage scapular muscles, snow angels x 10 reps MANUAL THERAPY: MFR to cording in R axilla with increased tightness noted in inferior axilla and cording from medial axilla to medial upper arm  10/26/24: THERAPEUTIC EXERCISE: Pulleys x 2 min in direction of flexion and 2 min in direction of  abduction Ball up wall x 10 reps in direction of flexion and 10 reps in direction of R shoulder abduction THERAPEUTIC ACTIVITY: In supine on foam roller: supine scapular series using red theraband x 10 reps each with pt returning therapist demo as follows: narrow and wide grip flexion, horizontal abduction, ER and diagonals with v/c for correct form and to engage scapular muscles, snow angels x 10 reps MANUAL THERAPY: STM in L sidelying to R upper traps, levator, posterior and lateral cervical muscles, suboccipitals and clavicular origin of pec using cocoa butter and wave tool MFR to cording in R axilla with increased tightness noted in inferior axilla  10/21/24: THERAPEUTIC EXERCISE: Pulleys x 2 min in direction of flexion and 2 min in direction of abduction Ball up wall x 10 reps in direction of flexion and 10 reps in direction of R shoulder abduction THERAPEUTIC ACTIVITY: In supine on foam roller: supine scapular series using red theraband x 10 reps each with pt returning therapist demo as follows: narrow and wide grip flexion, horizontal abduction, ER and diagonals with v/c for correct form and to engage scapular muscles, arms out in horizontal abduction with 60 sec hold, snow angels x 10 reps MANUAL THERAPY: STM in L sidelying to R upper  traps, levator, posterior and lateral cervical muscles, suboccipitals and clavicular origin of pec (no cocoa butter today as pt has radiation at 11), less tightness palpable today and decreased sensitivity  MFR to cording in R axilla with less cording palpable today  10/19/24: THERAPEUTIC EXERCISE: Pulleys x 2 min in direction of flexion and 2 min in direction of abduction Ball up wall x 10 reps in direction of flexion and 10 reps in direction of R shoulder abduction THERAPEUTIC ACTIVITY: In supine on foam roller: supine scapular series using red theraband x 10 reps each with pt returning therapist demo as follows: narrow and wide grip flexion, horizontal  abduction, ER and diagonals with v/c for correct form and to engage scapular muscles, arms out in horizontal abduction with 60 sec hold, snow angels x 10 reps MANUAL THERAPY: STM in L sidelying to R upper traps, levator, posterior and lateral cervical muscles, suboccipitals and clavicular origin of pec using cocoa butter with numerous knots palpable and increased muscle tightness and sensitivity that eased by end of session 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  10/14/24: THERAPEUTIC EXERCISE: Pulleys x 2 min in direction of flexion and 2 min in direction of abduction Ball up wall x 10 reps in direction of flexion and 10 reps in direction of R shoulder abduction THERAPEUTIC ACTIVITY: In supine: supine scapular series using red theraband x 10 reps each with pt returning therapist demo as follows: narrow and wide grip flexion, horizontal abduction, ER and diagonals with v/c for correct form and to engage scapular muscles MANUAL THERAPY: MFR to cording in R axilla with very thick cord palpable in R axilla extending down upper arm PROM in supine in to flexion and abduction Gentle STM to seroma in inferior breast as well as MLD moving fluid towards pathway aimed at right lateral trunk towards groin   10/12/24: THERAPEUTIC EXERCISE: Pulleys x 2 min in direction of flexion and 2 min in direction of abduction Ball up wall x 10 reps in direction of flexion and 10 reps in direction of R shoulder abduction THERAPEUTIC ACTIVITY: In supine: supine scapular series using red theraband x 10 reps each with pt returning therapist demo as follows: narrow and wide grip flexion, horizontal abduction, ER and diagonals with v/c for correct form and to engage scapular muscles MANUAL THERAPY: MFR to cording in R axilla with very thick cord palpable in R axilla extending down upper arm PROM in supine in to flexion and abduction Gentle STM to seroma in inferior breast as well as MLD moving fluid towards pathway  aimed at right lateral trunk towards groin  09/30/24: THERAPEUTIC EXERCISE: Pulleys x 2 min in direction of flexion and 2 min in direction of abduction Ball up wall x 10 reps in direction of flexion and 10 reps in direction of R shoulder abduction THERAPEUTIC ACTIVITY: In supine: supine scapular series using red theraband x 10 reps each with pt returning therapist demo as follows: narrow and wide grip flexion, horizontal abduction, ER and diagonals with v/c for correct form and to engage scapular muscles Snow angels x10 reps MANUAL THERAPY: MFR to cording in R axilla with cording more difficult to palpate  PROM in supine in to flexion and abduction Gentle STM to seroma in inferior breast as well as MLD moving fluid towards pathway aimed at right lateral trunk towards groin  09/22/24: THERAPEUTIC EXERCISE: Pulleys x 2 min in direction of flexion and 2 min in direction of abduction Ball up wall x 10 reps  in direction of flexion and 10 reps in direction of R shoulder abduction THERAPEUTIC ACTIVITY: Supine on mat table: alternating flexion x 10, bilateral scaption x 10, snow angels x 10 MANUAL THERAPY: MFR to cording in R axilla with cording more difficult to palpate  PROM in supine in to flexion and abduction Gentle STM to seroma in inferior breast as well as MLD moving fluid towards pathway aimed at right lateral trunk towards groin   09/20/24: THERAPEUTIC EXERCISE: Pulleys x 2 min in direction of flexion and 2 min in direction of abduction Ball up wall x 10 reps in direction of flexion and 10 reps in direction of R shoulder abduction MANUAL THERAPY: MFR to cording in R axilla and upper arm with numerous cords palpable with UE in 90 degrees of abduction propped on pillows PROM in supine in to flexion and abduction Gentle STM to seroma in inferior breast as well as MLD moving fluid towards pathway aimed at right lateral trunk towards groin  09/16/24: THERAPEUTIC EXERCISE: Pulleys x 2  min in direction of flexion and 2 min in direction of abduction Ball up wall x 10 reps in direction of flexion and 10 reps in direction of R shoulder abduction THERAPEUTIC ACTIVITY: Supine over 1/2 foam roll: alternating flexion x 10, bilateral scaption x 10, snow angels (very limited motion due to tightness) Supine over 1/2 foam roll: supine scapular series and had her return demo 10 reps of each as follows with yellow band while therapist provided appropriate v/c and t/c: narrow and wide grip flexion, horizontal abduction, ER, bilateral diagonals MANUAL THERAPY: MFR to cording in R axilla and upper arm with numerous cords palpable with UE in 90 degrees with cording extending past antecubital fossa PROM in supine in to flexion and abduction  09/14/24: THERAPEUTIC EXERCISE: Pulleys x 2 min in direction of flexion and 2 min in direction of abduction Ball up wall x 10 reps in direction of flexion and 10 reps in direction of R shoulder abduction THERAPEUTIC ACTIVITY: Supine over 1/2 foam roll: alternating flexion x 10, bilateral scaption x 10, snow angels (very limited motion due to tightness) Supine over 1/2 foam roll: supine scapular series and had her return demo 10 reps of each as follows with yellow band while therapist provided appropriate v/c and t/c: narrow and wide grip flexion, horizontal abduction, ER, bilateral diagonals MANUAL THERAPY: MFR to cording in R axilla with numerous cords palpable with UE in 90 degrees of abduction PROM in supine in to flexion, abduction and ER to pt's tolerance with v/c and t/c to keep shoulder relaxed Gentle STM to seroma in inferior breast as well as MLD moving fluid towards pathway aimed at right lateral trunk towards groin  09/09/24: THERAPEUTIC EXERCISE: Pulleys x 2 min in direction of flexion and 2 min in direction of abduction Ball up wall x 10 reps in direction of flexion and 10 reps in direction of R shoulder abduction Instructed pt in supine  scapular series and had her return demo 10 reps of each as follows with yellow band while therapist provided appropriate v/c and t/c: narrow and wide grip flexion, horizontal abduction, ER, bilateral diagonals MANUAL THERAPY: MFR to cording in R axilla with numerous cords palpable with UE in 120 degrees of abduction support on pillow with elbow bent PROM in supine in to flexion, abduction and ER to pt's tolerance  09/07/24: THERAPEUTIC EXERCISE: Pulleys x 2 min in direction of flexion and 2 min in direction of abduction Ball up  wall x 10 reps in direction of flexion and 10 reps in direction of R shoulder abduction MANUAL THERAPY: MFR to cording in R axilla with numerous cords palpable with UE in 120 degrees of abduction support on pillow with elbow bent Gentle STM to area of fibrosis/seroma in R breast - educated pt to let her radiation dr know about this to be sure it will not interfere with radation  PATIENT EDUCATION:  Education details: ABC class, need for compression bra, axillary cording, seroma vs scar tissue, need for stretching for cording, side bending QL stretch Person educated: Patient Education method: Explanation, Demonstration, and Handouts Education comprehension: verbalized understanding  HOME EXERCISE PROGRAM: Reviewed previously given post op HEP. Wear compression bra Quadratus lumborum stretch 1-2x/daily holding for 30-60 seconds  ASSESSMENT:  CLINICAL IMPRESSION: Spent session instructing pt in self MLD for R breast since she is still having swelling since her radiation ended. The area of fibrosis in her inferior breast has softened some from last session. Educated pt in each step and had her return demo. Issued instructions for pt to practice self MLD at home.    Pt will benefit from skilled therapeutic intervention to improve on the following deficits: Decreased knowledge of precautions, impaired UE functional use, pain, decreased ROM, postural dysfunction.   PT  treatment/interventions: ADL/Self care home management, [phone]- PT Re-evaluation, 97110-Therapeutic exercises, 97530- Therapeutic activity, [id number]- Neuromuscular re-education, 97535- Self Care, [id number]- Manual therapy, [phone]- Orthotic Initial, [phone]- Orthotic/Prosthetic subsequent, and Patient/Family education   GOALS: Goals reviewed with patient? Yes  GOALS MET AT EVAL:  GOALS Name Target Date Goal status  1 Pt will be able to verbalize understanding of pertinent lymphedema risk reduction practices relevant to her dx specifically related to skin care.  Baseline:  No knowledge Eval Achieved at eval  2 Pt will be able to return demo and/or verbalize understanding of the post op HEP related to regaining shoulder ROM. Baseline:  No knowledge Eval Achieved at eval  3 Pt will be able to verbalize understanding of the importance of viewing the post op After Breast CA Class video for further lymphedema risk reduction education and therapeutic exercise.  Baseline:  No knowledge Eval Achieved at eval   LONG TERM GOALS:  (STG=LTG)  GOALS Name Target Date  Goal status  1 Pt will demonstrate she has regained full shoulder ROM and function post operatively compared to baselines.  Baseline: 09/29/24 MET 10/26/24  2 Pt will demonstrate 155 degrees of R shoulder abduction to allow her to reach out to the side. 09/29/24 MET 10/26/24 175 degrees  3 Pt will report a 75% improvement in L flank pain to allow improved comfort and mobility. 09/29/24 MET 10/26/24 - no longer having pain  4 Pt will report 75% improvement in pain from cording with R shoulder abduction to allow improved comfort.  09/29/24 MET 10/26/24 80% improvement  5 Pt will be independent in a home exercise program for continued stretching and strengthening.  09/29/24 ONGOING  6 Pt will have no pain in R axilla at end range to allow improved comfort. 11/23/24 NEW     PLAN:  PT FREQUENCY/DURATION: 1x/wk for 4 weeks started week of Dec 29 due to  holidays  PLAN FOR NEXT SESSION: how is self MLD R breast, pulleys, ball, MFR to cording in R axilla, PROM to R shoulder, STM to area of seroma in inferiror breast   [facility]  [address]  [place] [address]  [phone]  [facility] [place], PT 11/17/2024, 12:02 PM "

## 2024-11-18 NOTE — Progress Notes (Signed)
 "  Radiation Oncology         [phone] ________________________________  Name: Margaret [name] MRN: [id number]  Date: 11/23/2024  DOB: 21-Apr-1951  Follow-Up Visit Note  CC: Margaret Round, MD  Margaret Round, MD  No diagnosis found. 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, ER+ / PR- / Her2-, Grade 2; s/p adjuvant radiation completed on 10/22/2024  Previous Treatment and Interval Since Last Radiation: 1 month  ==========DELIVERED PLANS==========  First Treatment Date: 2024-09-23 Last Treatment Date: 2024-10-22   Plan Name: Breast_R Site: Breast, Right Technique: 3D Mode: Photon Dose Per Fraction: 2.67 Gy Prescribed Dose (Delivered / Prescribed): 40.05 Gy / 40.05 Gy Prescribed Fxs (Delivered / Prescribed): 15 / 15   Plan Name: Breast_R_Bst Site: Breast, Right Technique: 3D Mode: Photon Dose Per Fraction: 2 Gy Prescribed Dose (Delivered / Prescribed): 12 Gy / 12 Gy Prescribed Fxs (Delivered / Prescribed): 6 / 6  Narrative:  The patient returns today for routine follow-up. She completed her treatment approximately one month ago.  In the interval since she was last seen, she continued with PT for stiffness of her right shoulder. She started taking anastrozole , as prescribed by Dr. Gudena, on 11/11/2024. ***  ***               ALLERGIES:  is allergic to amoxicillin -pot clavulanate, ciprofloxacin, macrobid [nitrofurantoin], pantoprazole  sodium, and sulfa antibiotics.  Meds: Current Outpatient Medications  Medication Sig Dispense Refill   acetaminophen  (TYLENOL ) 500 MG tablet Take 500-1,000 mg by mouth every 6 (six) hours as needed (pain.).     anastrozole  (ARIMIDEX ) 1 MG tablet Take 1 tablet (1 mg total) by mouth daily. 90 tablet 3   azelastine (ASTELIN) 0.1 % nasal spray Place 1-2 sprays into both nostrils 2 (two) times daily as needed for allergies. Use in each nostril as directed      Calcium -Vitamin D -Vitamin K (CHEWABLE CALCIUM  PO) Take 1 tablet by mouth in the morning.     ciclopirox  (PENLAC ) 8 % solution Apply topically at bedtime. Apply over nail and surrounding skin. Apply daily over previous coat. After seven (7) days, may remove with alcohol and continue cycle. 6.6 mL 3   diclofenac  Sodium (VOLTAREN ) 1 % GEL Apply 1 Application topically 4 (four) times daily as needed (hand/wrist pain.).     ezetimibe  (ZETIA ) 10 MG tablet Take 1 tablet (10 mg total) by mouth daily. 90 tablet 3   Ferrous Sulfate (IRON PO) Take 1 tablet by mouth in the morning.     fluticasone (FLONASE) 50 MCG/ACT nasal spray Place 1 spray into both nostrils daily as needed for allergies or rhinitis.     HAWTHORN PO Take 1 Capful by mouth 3 (three) times a week.     hydrochlorothiazide  (HYDRODIURIL ) 12.5 MG tablet Take 1 tablet (12.5 mg total) by mouth daily. 90 tablet 3   ibuprofen  (ADVIL ) 200 MG tablet Take 400 mg by mouth every 8 (eight) hours as needed (pain.).     nitroGLYCERIN  (NITROSTAT ) 0.4 MG SL tablet Place 1 tablet (0.4 mg total) under the tongue every 5 (five) minutes as needed for chest pain. 30 tablet 3   Omega-3 Fatty Acids (EPA PO) Take 1 capsule by mouth in the morning. Super EPA     omeprazole  (PRILOSEC) 40 MG capsule Take 40 mg by mouth daily before breakfast.     Polyethyl Glycol-Propyl Glycol (SYSTANE) 0.4-0.3 % SOLN Place 1-2  drops into both eyes 3 (three) times daily as needed (dry/irritated eyes.).     Povidone (IVIZIA DRY EYES OP) Place 1 drop into both eyes daily.     rosuvastatin  (CRESTOR ) 5 MG tablet Take 1 tablet by mouth 2 to 3 days per week 90 tablet 3   VITAMIN D  PO Take 1,000 Units by mouth in the morning.     No current facility-administered medications for this visit.    Physical Findings:   vitals were not taken for this visit. .    In general this is a well appearing *** in no acute distress. *** alert and oriented x4 and appropriate throughout the examination.  Cardiopulmonary assessment is negative for acute distress and *** exhibits normal effort.     *** breast: No palpable masses, dimpling, skin changes, or axillary adenopathy.  *** breast: ***  Lab Findings: Lab Results  Component Value Date   WBC 5.0 07/21/2024   HGB 11.7 (L) 07/21/2024   HCT 37.7 07/21/2024   MCV 82.9 07/21/2024   PLT 256 07/21/2024    Radiographic Findings: No results found.  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 has healed well from the effects of her radiation treatment. Continue skin care with topical Vitamin E Oil and/or lotion for at least 2 more months for further healing.  She will continue on *** under the care of Dr. FERNAND She is scheduled to see *** Margaret [name] for survivorship clinic on ***.  I encouraged her to continue with yearly mammography as appropriate. Radiation follow-up PRN. We appreciate the opportunity to take part in this patient's care. She has been advised to call back with any issues or concerns.  I personally spent *** minutes in this encounter including chart review, reviewing radiological studies, meeting face-to-face with the patient, entering orders and completing documentation.  ____________________________________    Leeroy Due, PA-C      "

## 2024-11-19 ENCOUNTER — Telehealth: Payer: Self-pay

## 2024-11-19 NOTE — Telephone Encounter (Signed)
 Pt called w/ concern about whether she could take Macrodantin for her UTI along w/ her anastrozole . Per MD okay to proceed w/ antibiotic. Pt also inquired about bone density scan not being scheduled, per chart her PCP placed the order and pt informed to follow up with PCP. Pt verbalized understanding.

## 2024-11-22 NOTE — Progress Notes (Incomplete)
 [name] returns today for a 1 month follow up to see Leeroy Due PA-C post radiation to the right breast. She completed their radiation on: 10-22-2024.     Does the patient complain of any of the following: Post radiation skin issues:  Breast Tenderness: Yes Breast Swelling: Yes Lymphadema: Yes Range of Motion limitations: Yes, she continued with PT for stiffness of her right shoulder. . Fatigue post radiation: Yes, moderate. Appetite good/fair/poor: Good  Additional comments if applicable: We appreciate the opportunity to take part in this patient's care. She has been advised to call back with any issues or concerns.    BP 120/75 (BP Location: Left Arm, Patient Position: Sitting, Cuff Size: Large)   Pulse 90   Temp (!) 97.5 F (36.4 C)   Resp 18   Ht 5' 7 (1.702 m)   Wt 173 lb 12.8 oz (78.8 kg)   SpO2 97%   BMI 27.22 kg/m

## 2024-11-23 ENCOUNTER — Ambulatory Visit
Admission: RE | Admit: 2024-11-23 | Discharge: 2024-11-23 | Disposition: A | Discharge status: Home / Self Care | Source: Ambulatory Visit | Attending: Radiology | Admitting: Radiology

## 2024-11-23 ENCOUNTER — Ambulatory Visit: Admitting: Physical Therapy

## 2024-11-23 ENCOUNTER — Encounter: Payer: Self-pay | Admitting: Radiology

## 2024-11-23 ENCOUNTER — Encounter: Payer: Self-pay | Admitting: Physical Therapy

## 2024-11-23 VITALS — BP 120/75 | HR 90 | Temp 97.5°F | Resp 18 | Ht 67.0 in | Wt 173.8 lb

## 2024-11-23 DIAGNOSIS — M79621 Pain in right upper arm: Secondary | ICD-10-CM

## 2024-11-23 DIAGNOSIS — Z79899 Other long term (current) drug therapy: Secondary | ICD-10-CM | POA: Insufficient documentation

## 2024-11-23 DIAGNOSIS — Z923 Personal history of irradiation: Secondary | ICD-10-CM | POA: Diagnosis not present

## 2024-11-23 DIAGNOSIS — Z1722 Progesterone receptor negative status: Secondary | ICD-10-CM | POA: Insufficient documentation

## 2024-11-23 DIAGNOSIS — Z170 Estrogen receptor positive status [ER+]: Secondary | ICD-10-CM | POA: Insufficient documentation

## 2024-11-23 DIAGNOSIS — Z1732 Human epidermal growth factor receptor 2 negative status: Secondary | ICD-10-CM | POA: Insufficient documentation

## 2024-11-23 DIAGNOSIS — R293 Abnormal posture: Secondary | ICD-10-CM

## 2024-11-23 DIAGNOSIS — C50311 Malignant neoplasm of lower-inner quadrant of right female breast: Secondary | ICD-10-CM | POA: Insufficient documentation

## 2024-11-23 DIAGNOSIS — M25611 Stiffness of right shoulder, not elsewhere classified: Secondary | ICD-10-CM

## 2024-11-23 DIAGNOSIS — N644 Mastodynia: Secondary | ICD-10-CM | POA: Insufficient documentation

## 2024-11-23 DIAGNOSIS — R10A2 Flank pain, left side: Secondary | ICD-10-CM

## 2024-11-23 DIAGNOSIS — Z79811 Long term (current) use of aromatase inhibitors: Secondary | ICD-10-CM | POA: Insufficient documentation

## 2024-11-23 DIAGNOSIS — Z483 Aftercare following surgery for neoplasm: Secondary | ICD-10-CM

## 2024-11-23 DIAGNOSIS — R600 Localized edema: Secondary | ICD-10-CM

## 2024-11-23 HISTORY — DX: Personal history of irradiation: Z92.3

## 2024-11-23 NOTE — Therapy (Signed)
 " OUTPATIENT PHYSICAL THERAPY BREAST CANCER POST OP FOLLOW UP   Patient Name: Margaret [name] MRN: [id number] DOB:1951/08/16, 74 y.o., female Today's Date: 11/23/2024  END OF SESSION:  PT End of Session - 11/23/24 1112     Visit Number 17    Number of Visits 18    Date for Recertification  11/30/24    PT Start Time 1104    PT Stop Time 1157    PT Time Calculation (min) 53 min    Activity Tolerance Patient tolerated treatment well    Behavior During Therapy [facility] for tasks assessed/performed              Past Medical History:  Diagnosis Date   Allergy    Anemia    Anxiety    Arthritis    CAD in native artery 11/27/2023   Cancer (HCC)    right breast 2025   Colitis    Dyspareunia    Endometriosis    Family history of breast cancer    GERD (gastroesophageal reflux disease)    High cholesterol    Hx of abnormal Pap smear 1980's   Hypertension    Osteopenia    Primary hypertension 11/27/2013   Ulcer    Urinary incontinence    Past Surgical History:  Procedure Laterality Date   ABDOMINAL HYSTERECTOMY  09/11/2008   TVH/BSO--Dr. Nikki with TVT   BILATERAL SALPINGOOPHORECTOMY     2009   BLADDER SUSPENSION  09/11/2008   Dr. Nikki   BREAST BIOPSY Right 07/09/2024   US  RT BREAST BX W LOC DEV 1ST LESION IMG BX SPEC US  GUIDE 07/09/2024 [facility]   BREAST BIOPSY  08/10/2024   US  RT RADIOACTIVE SEED LOC 08/10/2024 [facility]   BREAST LUMPECTOMY WITH RADIOACTIVE SEED AND SENTINEL LYMPH NODE BIOPSY Right 08/12/2024   Procedure: BREAST LUMPECTOMY WITH RADIOACTIVE SEED AND SENTINEL LYMPH NODE BIOPSY W/MAGTRACE;  Surgeon: Curvin Deward MOULD, MD;  Location: [facility] OR;  Service: General;  Laterality: Right;  GEN w/PEC BLOCK RIGHT BREAST RADIOACTIVE SEED LUMPECTOMY SENTINEL NODE BIOPSY   CERVIX LESION DESTRUCTION  11/11/1978   hx abnormal pap--dysplasia   COLONOSCOPY WITH PROPOFOL  Left 10/25/2017   Procedure: COLONOSCOPY WITH PROPOFOL ;  Surgeon: Saintclair Jasper, MD;   Location: [facility];  Service: Gastroenterology;  Laterality: Left;   Patient Active Problem List   Diagnosis Date Noted   Genetic testing 08/03/2024   Family history of breast cancer    Malignant neoplasm of lower-inner quadrant of right breast of female, estrogen receptor positive (HCC) 07/16/2024   Hyperlipidemia LDL goal <70 03/09/2024   CAD in native artery 11/27/2023   Gastroesophageal reflux disease 06/06/2020   Atypical chest pain 04/21/2020   Closed nondisplaced fracture of proximal phalanx of lesser toe of left foot 11/19/2017   GI bleed 10/23/2017   Hypokalemia 10/23/2017   Nausea vomiting and diarrhea 10/23/2017   Hematochezia 10/23/2017   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 Deward Curvin  REFERRING DIAG: R breast cancer  THERAPY DIAG:  Stiffness of right shoulder, not elsewhere classified  Aftercare following surgery for neoplasm  Localized edema  Pain in right upper arm  Flank pain, left side  Abnormal posture  Malignant neoplasm of lower-inner quadrant of right breast of female, estrogen receptor positive (HCC)  Rationale for Evaluation and Treatment: Rehabilitation  ONSET DATE: 07/13/24  SUBJECTIVE:                                                                                                                                                                                           SUBJECTIVE STATEMENT: My pain is doing better. I have just a little in my arm and my breast.   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 It measures 1.1 cm and is located in the lower inner quadrant. It is ER positive, PR negative, and HER2 negative with a Ki67 of 25%. 08/12/24- R breast  lumpectomy and SLNB 0/4  PATIENT GOALS:  Reassess how my recovery is going related to arm function, pain, and swelling.  PAIN:  Are you having pain? Yes: NPRS scale: 2/10 Pain location: R axilla Pain description: aching at lateral breast/axilla Aggravating factors: stretch too much Relieving factors: sleep incorrectly or too much lifting  PRECAUTIONS: Recent Surgery, right UE Lymphedema risk,   RED FLAGS: None   ACTIVITY LEVEL / LEISURE: currently doing post op exercises   OBJECTIVE:   PATIENT SURVEYS:  QUICK DASH:     OBSERVATIONS: Fibrotic area palpable in area of SLNB - feels similar to a seroma, healing lumpectomy scar with glue still intact, post op swelling visible in inferior breast  POSTURE:  Forward head and rounded shoulders posture   UPPER EXTREMITY AROM/PROM:   A/PROM RIGHT   eval   RIGHT 09/01/24 RIGHT 09/16/24 RIGHT 10/26/24  Shoulder extension 43 62    Shoulder flexion 149 148 162 159  Shoulder abduction 155 130 155 175  Shoulder internal rotation 79 69 66   Shoulder external rotation 79 80                            (Blank rows = not tested)   A/PROM LEFT   eval  Shoulder extension 53  Shoulder flexion 143  Shoulder abduction 161  Shoulder internal rotation 80  Shoulder external rotation 90                          (Blank rows = not tested)   CERVICAL AROM: All within functional limits   UPPER EXTREMITY STRENGTH: WFL   LYMPHEDEMA ASSESSMENTS (in cm):    LANDMARK RIGHT   eval 09/01/24  10 cm proximal to olecranon process 30.9 30.5  Olecranon process 27.4 27.8  10 cm proximal to ulnar styloid process 24.1 22  Just proximal to ulnar  styloid process 16.8 16.5  Across hand at thumb web space 19.4 19.6  At base of 2nd digit 5.9 5.7  (Blank rows = not tested)   LANDMARK LEFT   eval  10 cm proximal to olecranon process 30  Olecranon process 26.9  10 cm proximal to ulnar styloid process 22.4  Just proximal to ulnar styloid process  16.1  Across hand at thumb web space 19  At base of 2nd digit 6  (Blank rows = not tested)  Surgery type/Date: 08/12/24 R breast lumpectomy and SLNB Number of lymph nodes removed: 0/4 Current/past treatment (chemo, radiation, hormone therapy): will require radiation and hormone therapy  Other symptoms:  Heaviness/tightness Yes Pain Yes Pitting edema No Infections No Decreased scar mobility Yes Stemmer sign No  TREATMENT PERFORMED: 11/23/24: THERAPEUTIC EXERCISE: Pulleys x 2 min in direction of flexion and 2 min in direction of abduction Ball up wall x 10 reps in direction of flexion and 10 reps in direction of R shoulder abduction MANUAL THERAPY: Instructed pt in self MLD for R breast as follows: In supine: Short neck, 5 diaphragmatic breaths, L axillary nodes and establishment of interaxillary pathway, R inguinal nodes and establishment of axilloinguinal pathway, then R breast moving fluid towards pathways spending extra time in any areas of fibrosis then retracing all steps. Had pt initially demonstrate how she has been doing self MLD and she had forgotten to do the initial steps so instructed pt in each of these steps and had her return demonstrate.   11/17/24: THERAPEUTIC EXERCISE: Pulleys x 2 min in direction of flexion and 2 min in direction of abduction Ball up wall x 10 reps in direction of flexion and 10 reps in direction of R shoulder abduction MANUAL THERAPY: Instructed pt in self MLD for R breast as follows: In supine: Short neck, 5 diaphragmatic breaths, L axillary nodes and establishment of interaxillary pathway, R inguinal nodes and establishment of axilloinguinal pathway, then R breast moving fluid towards pathways spending extra time in any areas of fibrosis then retracing all steps. Had pt return demonstrate each step and provided v/c and t/c for pressure, speed, and skin stretch technique with pt demonstrating improved technique by end of session.     11/09/24: THERAPEUTIC EXERCISE: Pulleys x 2 min in direction of flexion and 2 min in direction of abduction Ball up wall x 10 reps in direction of flexion and 10 reps in direction of R shoulder abduction THERAPEUTIC ACTIVITY: In supine on foam roller: supine scapular series using green theraband x 10 reps each with pt returning therapist demo as follows: narrow and wide grip flexion, horizontal abduction, ER and diagonals with v/c for correct form and to engage scapular muscles, snow angels x 10 reps MANUAL THERAPY: MFR to cording in R axilla with increased tightness noted in inferior axilla and cording from medial axilla to medial upper arm  10/26/24: THERAPEUTIC EXERCISE: Pulleys x 2 min in direction of flexion and 2 min in direction of abduction Ball up wall x 10 reps in direction of flexion and 10 reps in direction of R shoulder abduction THERAPEUTIC ACTIVITY: In supine on foam roller: supine scapular series using red theraband x 10 reps each with pt returning therapist demo as follows: narrow and wide grip flexion, horizontal abduction, ER and diagonals with v/c for correct form and to engage scapular muscles, snow angels x 10 reps MANUAL THERAPY: STM in L sidelying to R upper traps, levator, posterior and lateral cervical muscles, suboccipitals and clavicular origin  of pec using cocoa butter and wave tool MFR to cording in R axilla with increased tightness noted in inferior axilla  10/21/24: THERAPEUTIC EXERCISE: Pulleys x 2 min in direction of flexion and 2 min in direction of abduction Ball up wall x 10 reps in direction of flexion and 10 reps in direction of R shoulder abduction THERAPEUTIC ACTIVITY: In supine on foam roller: supine scapular series using red theraband x 10 reps each with pt returning therapist demo as follows: narrow and wide grip flexion, horizontal abduction, ER and diagonals with v/c for correct form and to engage scapular muscles, arms out in horizontal  abduction with 60 sec hold, snow angels x 10 reps MANUAL THERAPY: STM in L sidelying to R upper traps, levator, posterior and lateral cervical muscles, suboccipitals and clavicular origin of pec (no cocoa butter today as pt has radiation at 11), less tightness palpable today and decreased sensitivity  MFR to cording in R axilla with less cording palpable today  10/19/24: THERAPEUTIC EXERCISE: Pulleys x 2 min in direction of flexion and 2 min in direction of abduction Ball up wall x 10 reps in direction of flexion and 10 reps in direction of R shoulder abduction THERAPEUTIC ACTIVITY: In supine on foam roller: supine scapular series using red theraband x 10 reps each with pt returning therapist demo as follows: narrow and wide grip flexion, horizontal abduction, ER and diagonals with v/c for correct form and to engage scapular muscles, arms out in horizontal abduction with 60 sec hold, snow angels x 10 reps MANUAL THERAPY: STM in L sidelying to R upper traps, levator, posterior and lateral cervical muscles, suboccipitals and clavicular origin of pec using cocoa butter with numerous knots palpable and increased muscle tightness and sensitivity that eased by end of session 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  10/14/24: THERAPEUTIC EXERCISE: Pulleys x 2 min in direction of flexion and 2 min in direction of abduction Ball up wall x 10 reps in direction of flexion and 10 reps in direction of R shoulder abduction THERAPEUTIC ACTIVITY: In supine: supine scapular series using red theraband x 10 reps each with pt returning therapist demo as follows: narrow and wide grip flexion, horizontal abduction, ER and diagonals with v/c for correct form and to engage scapular muscles MANUAL THERAPY: MFR to cording in R axilla with very thick cord palpable in R axilla extending down upper arm PROM in supine in to flexion and abduction Gentle STM to seroma in inferior breast as well as MLD moving fluid towards  pathway aimed at right lateral trunk towards groin   10/12/24: THERAPEUTIC EXERCISE: Pulleys x 2 min in direction of flexion and 2 min in direction of abduction Ball up wall x 10 reps in direction of flexion and 10 reps in direction of R shoulder abduction THERAPEUTIC ACTIVITY: In supine: supine scapular series using red theraband x 10 reps each with pt returning therapist demo as follows: narrow and wide grip flexion, horizontal abduction, ER and diagonals with v/c for correct form and to engage scapular muscles MANUAL THERAPY: MFR to cording in R axilla with very thick cord palpable in R axilla extending down upper arm PROM in supine in to flexion and abduction Gentle STM to seroma in inferior breast as well as MLD moving fluid towards pathway aimed at right lateral trunk towards groin  09/30/24: THERAPEUTIC EXERCISE: Pulleys x 2 min in direction of flexion and 2 min in direction of abduction Ball up wall x 10 reps in direction  of flexion and 10 reps in direction of R shoulder abduction THERAPEUTIC ACTIVITY: In supine: supine scapular series using red theraband x 10 reps each with pt returning therapist demo as follows: narrow and wide grip flexion, horizontal abduction, ER and diagonals with v/c for correct form and to engage scapular muscles Snow angels x10 reps MANUAL THERAPY: MFR to cording in R axilla with cording more difficult to palpate  PROM in supine in to flexion and abduction Gentle STM to seroma in inferior breast as well as MLD moving fluid towards pathway aimed at right lateral trunk towards groin  09/22/24: THERAPEUTIC EXERCISE: Pulleys x 2 min in direction of flexion and 2 min in direction of abduction Ball up wall x 10 reps in direction of flexion and 10 reps in direction of R shoulder abduction THERAPEUTIC ACTIVITY: Supine on mat table: alternating flexion x 10, bilateral scaption x 10, snow angels x 10 MANUAL THERAPY: MFR to cording in R axilla with cording more  difficult to palpate  PROM in supine in to flexion and abduction Gentle STM to seroma in inferior breast as well as MLD moving fluid towards pathway aimed at right lateral trunk towards groin   09/20/24: THERAPEUTIC EXERCISE: Pulleys x 2 min in direction of flexion and 2 min in direction of abduction Ball up wall x 10 reps in direction of flexion and 10 reps in direction of R shoulder abduction MANUAL THERAPY: MFR to cording in R axilla and upper arm with numerous cords palpable with UE in 90 degrees of abduction propped on pillows PROM in supine in to flexion and abduction Gentle STM to seroma in inferior breast as well as MLD moving fluid towards pathway aimed at right lateral trunk towards groin  09/16/24: THERAPEUTIC EXERCISE: Pulleys x 2 min in direction of flexion and 2 min in direction of abduction Ball up wall x 10 reps in direction of flexion and 10 reps in direction of R shoulder abduction THERAPEUTIC ACTIVITY: Supine over 1/2 foam roll: alternating flexion x 10, bilateral scaption x 10, snow angels (very limited motion due to tightness) Supine over 1/2 foam roll: supine scapular series and had her return demo 10 reps of each as follows with yellow band while therapist provided appropriate v/c and t/c: narrow and wide grip flexion, horizontal abduction, ER, bilateral diagonals MANUAL THERAPY: MFR to cording in R axilla and upper arm with numerous cords palpable with UE in 90 degrees with cording extending past antecubital fossa PROM in supine in to flexion and abduction  09/14/24: THERAPEUTIC EXERCISE: Pulleys x 2 min in direction of flexion and 2 min in direction of abduction Ball up wall x 10 reps in direction of flexion and 10 reps in direction of R shoulder abduction THERAPEUTIC ACTIVITY: Supine over 1/2 foam roll: alternating flexion x 10, bilateral scaption x 10, snow angels (very limited motion due to tightness) Supine over 1/2 foam roll: supine scapular series and had  her return demo 10 reps of each as follows with yellow band while therapist provided appropriate v/c and t/c: narrow and wide grip flexion, horizontal abduction, ER, bilateral diagonals MANUAL THERAPY: MFR to cording in R axilla with numerous cords palpable with UE in 90 degrees of abduction PROM in supine in to flexion, abduction and ER to pt's tolerance with v/c and t/c to keep shoulder relaxed Gentle STM to seroma in inferior breast as well as MLD moving fluid towards pathway aimed at right lateral trunk towards groin  09/09/24: THERAPEUTIC EXERCISE: Pulleys x 2  min in direction of flexion and 2 min in direction of abduction Ball up wall x 10 reps in direction of flexion and 10 reps in direction of R shoulder abduction Instructed pt in supine scapular series and had her return demo 10 reps of each as follows with yellow band while therapist provided appropriate v/c and t/c: narrow and wide grip flexion, horizontal abduction, ER, bilateral diagonals MANUAL THERAPY: MFR to cording in R axilla with numerous cords palpable with UE in 120 degrees of abduction support on pillow with elbow bent PROM in supine in to flexion, abduction and ER to pt's tolerance  09/07/24: THERAPEUTIC EXERCISE: Pulleys x 2 min in direction of flexion and 2 min in direction of abduction Ball up wall x 10 reps in direction of flexion and 10 reps in direction of R shoulder abduction MANUAL THERAPY: MFR to cording in R axilla with numerous cords palpable with UE in 120 degrees of abduction support on pillow with elbow bent Gentle STM to area of fibrosis/seroma in R breast - educated pt to let her radiation dr know about this to be sure it will not interfere with radation  PATIENT EDUCATION:  Education details: ABC class, need for compression bra, axillary cording, seroma vs scar tissue, need for stretching for cording, side bending QL stretch Person educated: Patient Education method: Explanation, Demonstration, and  Handouts Education comprehension: verbalized understanding  HOME EXERCISE PROGRAM: Reviewed previously given post op HEP. Wear compression bra Quadratus lumborum stretch 1-2x/daily holding for 30-60 seconds  ASSESSMENT:  CLINICAL IMPRESSION: Continued to instruct pt in proper MLD technique and sequence. Encouraged pt to follow handout to help remember each step. Had pt return demonstrate each step and provided appropriate v/c and t/c for sequence and form.   Pt will benefit from skilled therapeutic intervention to improve on the following deficits: Decreased knowledge of precautions, impaired UE functional use, pain, decreased ROM, postural dysfunction.   PT treatment/interventions: ADL/Self care home management, [phone]- PT Re-evaluation, 97110-Therapeutic exercises, 97530- Therapeutic activity, [id number]- Neuromuscular re-education, 97535- Self Care, [id number]- Manual therapy, [phone]- Orthotic Initial, [phone]- Orthotic/Prosthetic subsequent, and Patient/Family education   GOALS: Goals reviewed with patient? Yes  GOALS MET AT EVAL:  GOALS Name Target Date Goal status  1 Pt will be able to verbalize understanding of pertinent lymphedema risk reduction practices relevant to her dx specifically related to skin care.  Baseline:  No knowledge Eval Achieved at eval  2 Pt will be able to return demo and/or verbalize understanding of the post op HEP related to regaining shoulder ROM. Baseline:  No knowledge Eval Achieved at eval  3 Pt will be able to verbalize understanding of the importance of viewing the post op After Breast CA Class video for further lymphedema risk reduction education and therapeutic exercise.  Baseline:  No knowledge Eval Achieved at eval   LONG TERM GOALS:  (STG=LTG)  GOALS Name Target Date  Goal status  1 Pt will demonstrate she has regained full shoulder ROM and function post operatively compared to baselines.  Baseline: 09/29/24 MET 10/26/24  2 Pt will demonstrate 155  degrees of R shoulder abduction to allow her to reach out to the side. 09/29/24 MET 10/26/24 175 degrees  3 Pt will report a 75% improvement in L flank pain to allow improved comfort and mobility. 09/29/24 MET 10/26/24 - no longer having pain  4 Pt will report 75% improvement in pain from cording with R shoulder abduction to allow improved comfort.  09/29/24 MET 10/26/24 80% improvement  5 Pt will be independent in a home exercise program for continued stretching and strengthening.  09/29/24 ONGOING  6 Pt will have no pain in R axilla at end range to allow improved comfort. 11/23/24 NEW     PLAN:  PT FREQUENCY/DURATION: 1x/wk for 4 weeks started week of Dec 29 due to holidays  PLAN FOR NEXT SESSION: how is self MLD R breast, pulleys, ball, MFR to cording in R axilla, PROM to R shoulder, STM to area of seroma in inferiror breast   [facility]  [address]  [place] [address]  [phone]   Florina Sever [place], PT 11/23/2024, 11:59 AM "

## 2024-11-26 ENCOUNTER — Inpatient Hospital Stay: Attending: Internal Medicine

## 2024-11-26 NOTE — Progress Notes (Signed)
 I reviewed patient visit with social work Tax inspector. I concur with the treatment plan as documented in the SW intern's note.   Analis Distler E Siaosi Alter, LCSW Clinical Child psychotherapist

## 2024-11-26 NOTE — Progress Notes (Signed)
 [facility] Clinical Social Work  Dietitian made contact: Yes   Method of contact:  Phone  Has the connection been helpful? Yes  If yes, how so? feeling less alone  and feeling more hopeful   Other feedback about your mentor or the [employer] program? It was comforting to hear from a survivor who has been through it.     Other Psychosocial Needs: Patient reported stressors: Adjusting to life in survivorship- I'm still going through it.    Interventions: Discussed common feeling and emotions even into survivorship and the importance of support while processing the experience of cancer Informed patient of the support team roles and support services at [facility] Provided CSW contact information and encouraged patient to call with any questions or concerns Referred patient to [facility] beginning 01/18/25 Emailed patient Support Services program calendar and [employer] calendar upon request     Thersia KATHEE Daring Clinical Social Work Intern  [employer]

## 2024-12-02 ENCOUNTER — Encounter: Payer: Self-pay | Admitting: Physical Therapy

## 2024-12-02 ENCOUNTER — Ambulatory Visit: Admitting: Physical Therapy

## 2024-12-02 DIAGNOSIS — Z483 Aftercare following surgery for neoplasm: Secondary | ICD-10-CM

## 2024-12-02 DIAGNOSIS — M25611 Stiffness of right shoulder, not elsewhere classified: Secondary | ICD-10-CM | POA: Diagnosis not present

## 2024-12-02 DIAGNOSIS — C50311 Malignant neoplasm of lower-inner quadrant of right female breast: Secondary | ICD-10-CM

## 2024-12-02 DIAGNOSIS — R600 Localized edema: Secondary | ICD-10-CM

## 2024-12-02 DIAGNOSIS — R293 Abnormal posture: Secondary | ICD-10-CM

## 2024-12-02 DIAGNOSIS — M79621 Pain in right upper arm: Secondary | ICD-10-CM

## 2024-12-02 NOTE — Therapy (Signed)
 " OUTPATIENT PHYSICAL THERAPY BREAST CANCER POST OP FOLLOW UP   Patient Name: Margaret [name] MRN: [id number] DOB:[birth year], 74 y.o., female Today's Date: 12/02/2024  END OF SESSION:  PT End of Session - 12/02/24 1106     Visit Number 18    Number of Visits 18    Date for Recertification  11/30/24    PT Start Time 1105    PT Stop Time 1203    PT Time Calculation (min) 58 min    Activity Tolerance Patient tolerated treatment well    Behavior During Therapy [facility] for tasks assessed/performed              Past Medical History:  Diagnosis Date   Allergy    Anemia    Anxiety    Arthritis    CAD in native artery 11/27/2023   Cancer (HCC)    right breast 2025   Colitis    Dyspareunia    Endometriosis    Family history of breast cancer    GERD (gastroesophageal reflux disease)    High cholesterol    History of radiation therapy    Right Breast 09/23/24 - 10/22/24 Dr. Lynwood Nasuti, MD   Hx of abnormal Pap smear 1980's   Hypertension    Osteopenia    Primary hypertension 11/27/2013   Ulcer    Urinary incontinence    Past Surgical History:  Procedure Laterality Date   ABDOMINAL HYSTERECTOMY  09/11/2008   TVH/BSO--Dr. Nikki with TVT   BILATERAL SALPINGOOPHORECTOMY     2009   BLADDER SUSPENSION  09/11/2008   Dr. Nikki   BREAST BIOPSY Right 07/09/2024   US  RT BREAST BX W LOC DEV 1ST LESION IMG BX SPEC US  GUIDE 07/09/2024 [facility]   BREAST BIOPSY  08/10/2024   US  RT RADIOACTIVE SEED LOC 08/10/2024 [facility]   BREAST LUMPECTOMY WITH RADIOACTIVE SEED AND SENTINEL LYMPH NODE BIOPSY Right 08/12/2024   Procedure: BREAST LUMPECTOMY WITH RADIOACTIVE SEED AND SENTINEL LYMPH NODE BIOPSY W/MAGTRACE;  Surgeon: Curvin Deward MOULD, MD;  Location: [facility] OR;  Service: General;  Laterality: Right;  GEN w/PEC BLOCK RIGHT BREAST RADIOACTIVE SEED LUMPECTOMY SENTINEL NODE BIOPSY   CERVIX LESION DESTRUCTION  11/11/1978   hx abnormal pap--dysplasia   COLONOSCOPY WITH  PROPOFOL  Left 10/25/2017   Procedure: COLONOSCOPY WITH PROPOFOL ;  Surgeon: Saintclair Jasper, MD;  Location: [facility];  Service: Gastroenterology;  Laterality: Left;   Patient Active Problem List   Diagnosis Date Noted   Genetic testing 08/03/2024   Family history of breast cancer    Malignant neoplasm of lower-inner quadrant of right breast of female, estrogen receptor positive (HCC) 07/16/2024   Hyperlipidemia LDL goal <70 03/09/2024   CAD in native artery 11/27/2023   Gastroesophageal reflux disease 06/06/2020   Atypical chest pain 04/21/2020   Closed nondisplaced fracture of proximal phalanx of lesser toe of left foot 11/19/2017   GI bleed 10/23/2017   Hypokalemia 10/23/2017   Nausea vomiting and diarrhea 10/23/2017   Hematochezia 10/23/2017   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 Deward Curvin  REFERRING DIAG: R breast cancer  THERAPY DIAG:  Stiffness of right shoulder, not elsewhere classified  Aftercare following surgery for neoplasm  Localized edema  Pain in right upper arm  Abnormal posture  Malignant neoplasm  of lower-inner quadrant of right breast of female, estrogen receptor positive (HCC)  Rationale for Evaluation and Treatment: Rehabilitation  ONSET DATE: 07/13/24  SUBJECTIVE:                                                                                                                                                                                           SUBJECTIVE STATEMENT: I have been working on that hard area. It does not hurt per say but it just feels weird.   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 It measures 1.1 cm and is located in the lower inner quadrant. It is  ER positive, PR negative, and HER2 negative with a Ki67 of 25%. 08/12/24- R breast lumpectomy and SLNB 0/4  PATIENT GOALS:  Reassess how my recovery is going related to arm function, pain, and swelling.  PAIN:  Are you having pain? Yes: NPRS scale: 2/10 - intermittent Pain location: R axilla Pain description: aching at lateral breast/axilla Aggravating factors: stretch too much Relieving factors: sleep incorrectly or too much lifting  PRECAUTIONS: Recent Surgery, right UE Lymphedema risk,   RED FLAGS: None   ACTIVITY LEVEL / LEISURE: currently doing post op exercises   OBJECTIVE:   PATIENT SURVEYS:  QUICK DASH:     OBSERVATIONS: Fibrotic area palpable in area of SLNB - feels similar to a seroma, healing lumpectomy scar with glue still intact, post op swelling visible in inferior breast  POSTURE:  Forward head and rounded shoulders posture   UPPER EXTREMITY AROM/PROM:   A/PROM RIGHT   eval   RIGHT 09/01/24 RIGHT 09/16/24 RIGHT 10/26/24  Shoulder extension 43 62    Shoulder flexion 149 148 162 159  Shoulder abduction 155 130 155 175  Shoulder internal rotation 79 69 66   Shoulder external rotation 79 80                            (Blank rows = not tested)   A/PROM LEFT   eval  Shoulder extension 53  Shoulder flexion 143  Shoulder abduction 161  Shoulder internal rotation 80  Shoulder external rotation 90                          (Blank rows = not tested)   CERVICAL AROM: All within functional limits   UPPER EXTREMITY STRENGTH: WFL   LYMPHEDEMA ASSESSMENTS (in cm):    LANDMARK RIGHT   eval 09/01/24  10 cm proximal to olecranon process 30.9 30.5  Olecranon  process 27.4 27.8  10 cm proximal to ulnar styloid process 24.1 22  Just proximal to ulnar styloid process 16.8 16.5  Across hand at thumb web space 19.4 19.6  At base of 2nd digit 5.9 5.7  (Blank rows = not tested)   LANDMARK LEFT   eval  10 cm proximal to olecranon process 30  Olecranon  process 26.9  10 cm proximal to ulnar styloid process 22.4  Just proximal to ulnar styloid process 16.1  Across hand at thumb web space 19  At base of 2nd digit 6  (Blank rows = not tested)  Surgery type/Date: 08/12/24 R breast lumpectomy and SLNB Number of lymph nodes removed: 0/4 Current/past treatment (chemo, radiation, hormone therapy): will require radiation and hormone therapy  Other symptoms:  Heaviness/tightness Yes Pain Yes Pitting edema No Infections No Decreased scar mobility Yes Stemmer sign No  TREATMENT PERFORMED: 12/02/24: THERAPEUTIC EXERCISE: Pulleys x 2 min in direction of flexion and 2 min in direction of abduction Ball up wall x 10 reps in direction of flexion and 10 reps in direction of R shoulder abduction MANUAL THERAPY: MLD for R breast as follows: In supine: Short neck, 5 diaphragmatic breaths, L axillary nodes and establishment of interaxillary pathway, R inguinal nodes and establishment of axilloinguinal pathway, then R breast moving fluid towards pathways spending extra time in any areas of fibrosis then retracing all steps.STM to area of fibrosis in inferior breast with softening noted.  L-DEX FLOWSHEETS - 12/02/24 1200       L-DEX LYMPHEDEMA SCREENING   Measurement Type Unilateral    L-DEX MEASUREMENT EXTREMITY Upper Extremity    POSITION  Standing    DOMINANT SIDE Right    At Risk Side Right    BASELINE SCORE (UNILATERAL) -3    L-DEX SCORE (UNILATERAL) 0.8    VALUE CHANGE (UNILAT) 3.8          L-DEX FLOWSHEETS - 12/02/24 1200       L-DEX LYMPHEDEMA SCREENING   Measurement Type Unilateral    L-DEX MEASUREMENT EXTREMITY Upper Extremity    POSITION  Standing    DOMINANT SIDE Right    At Risk Side Right    BASELINE SCORE (UNILATERAL) -3    L-DEX SCORE (UNILATERAL) 0.8    VALUE CHANGE (UNILAT) 3.8         The patient was assessed using the L-Dex machine today to produce a lymphedema index baseline score. The patient will be reassessed  on a regular basis (typically every 3 months) to obtain new L-Dex scores. If the score is > 6.5 points away from his/her baseline score indicating onset of subclinical lymphedema, it will be recommended to wear a compression garment for 4 weeks, 12 hours per day and then be reassessed. If the score continues to be > 6.5 points from baseline at reassessment, we will initiate lymphedema treatment. Assessing in this manner has a 95% rate of preventing clinically significant lymphedema.   11/23/24: THERAPEUTIC EXERCISE: Pulleys x 2 min in direction of flexion and 2 min in direction of abduction Ball up wall x 10 reps in direction of flexion and 10 reps in direction of R shoulder abduction MANUAL THERAPY: Instructed pt in self MLD for R breast as follows: In supine: Short neck, 5 diaphragmatic breaths, L axillary nodes and establishment of interaxillary pathway, R inguinal nodes and establishment of axilloinguinal pathway, then R breast moving fluid towards pathways spending extra time in any areas of fibrosis then retracing all steps. Had pt  initially demonstrate how she has been doing self MLD and she had forgotten to do the initial steps so instructed pt in each of these steps and had her return demonstrate.   11/17/24: THERAPEUTIC EXERCISE: Pulleys x 2 min in direction of flexion and 2 min in direction of abduction Ball up wall x 10 reps in direction of flexion and 10 reps in direction of R shoulder abduction MANUAL THERAPY: Instructed pt in self MLD for R breast as follows: In supine: Short neck, 5 diaphragmatic breaths, L axillary nodes and establishment of interaxillary pathway, R inguinal nodes and establishment of axilloinguinal pathway, then R breast moving fluid towards pathways spending extra time in any areas of fibrosis then retracing all steps. Had pt return demonstrate each step and provided v/c and t/c for pressure, speed, and skin stretch technique with pt demonstrating improved technique by  end of session.    11/09/24: THERAPEUTIC EXERCISE: Pulleys x 2 min in direction of flexion and 2 min in direction of abduction Ball up wall x 10 reps in direction of flexion and 10 reps in direction of R shoulder abduction THERAPEUTIC ACTIVITY: In supine on foam roller: supine scapular series using green theraband x 10 reps each with pt returning therapist demo as follows: narrow and wide grip flexion, horizontal abduction, ER and diagonals with v/c for correct form and to engage scapular muscles, snow angels x 10 reps MANUAL THERAPY: MFR to cording in R axilla with increased tightness noted in inferior axilla and cording from medial axilla to medial upper arm  10/26/24: THERAPEUTIC EXERCISE: Pulleys x 2 min in direction of flexion and 2 min in direction of abduction Ball up wall x 10 reps in direction of flexion and 10 reps in direction of R shoulder abduction THERAPEUTIC ACTIVITY: In supine on foam roller: supine scapular series using red theraband x 10 reps each with pt returning therapist demo as follows: narrow and wide grip flexion, horizontal abduction, ER and diagonals with v/c for correct form and to engage scapular muscles, snow angels x 10 reps MANUAL THERAPY: STM in L sidelying to R upper traps, levator, posterior and lateral cervical muscles, suboccipitals and clavicular origin of pec using cocoa butter and wave tool MFR to cording in R axilla with increased tightness noted in inferior axilla  10/21/24: THERAPEUTIC EXERCISE: Pulleys x 2 min in direction of flexion and 2 min in direction of abduction Ball up wall x 10 reps in direction of flexion and 10 reps in direction of R shoulder abduction THERAPEUTIC ACTIVITY: In supine on foam roller: supine scapular series using red theraband x 10 reps each with pt returning therapist demo as follows: narrow and wide grip flexion, horizontal abduction, ER and diagonals with v/c for correct form and to engage scapular muscles, arms out  in horizontal abduction with 60 sec hold, snow angels x 10 reps MANUAL THERAPY: STM in L sidelying to R upper traps, levator, posterior and lateral cervical muscles, suboccipitals and clavicular origin of pec (no cocoa butter today as pt has radiation at 11), less tightness palpable today and decreased sensitivity  MFR to cording in R axilla with less cording palpable today  10/19/24: THERAPEUTIC EXERCISE: Pulleys x 2 min in direction of flexion and 2 min in direction of abduction Ball up wall x 10 reps in direction of flexion and 10 reps in direction of R shoulder abduction THERAPEUTIC ACTIVITY: In supine on foam roller: supine scapular series using red theraband x 10 reps each with pt returning therapist  demo as follows: narrow and wide grip flexion, horizontal abduction, ER and diagonals with v/c for correct form and to engage scapular muscles, arms out in horizontal abduction with 60 sec hold, snow angels x 10 reps MANUAL THERAPY: STM in L sidelying to R upper traps, levator, posterior and lateral cervical muscles, suboccipitals and clavicular origin of pec using cocoa butter with numerous knots palpable and increased muscle tightness and sensitivity that eased by end of session 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  10/14/24: THERAPEUTIC EXERCISE: Pulleys x 2 min in direction of flexion and 2 min in direction of abduction Ball up wall x 10 reps in direction of flexion and 10 reps in direction of R shoulder abduction THERAPEUTIC ACTIVITY: In supine: supine scapular series using red theraband x 10 reps each with pt returning therapist demo as follows: narrow and wide grip flexion, horizontal abduction, ER and diagonals with v/c for correct form and to engage scapular muscles MANUAL THERAPY: MFR to cording in R axilla with very thick cord palpable in R axilla extending down upper arm PROM in supine in to flexion and abduction Gentle STM to seroma in inferior breast as well as MLD moving  fluid towards pathway aimed at right lateral trunk towards groin   10/12/24: THERAPEUTIC EXERCISE: Pulleys x 2 min in direction of flexion and 2 min in direction of abduction Ball up wall x 10 reps in direction of flexion and 10 reps in direction of R shoulder abduction THERAPEUTIC ACTIVITY: In supine: supine scapular series using red theraband x 10 reps each with pt returning therapist demo as follows: narrow and wide grip flexion, horizontal abduction, ER and diagonals with v/c for correct form and to engage scapular muscles MANUAL THERAPY: MFR to cording in R axilla with very thick cord palpable in R axilla extending down upper arm PROM in supine in to flexion and abduction Gentle STM to seroma in inferior breast as well as MLD moving fluid towards pathway aimed at right lateral trunk towards groin  09/30/24: THERAPEUTIC EXERCISE: Pulleys x 2 min in direction of flexion and 2 min in direction of abduction Ball up wall x 10 reps in direction of flexion and 10 reps in direction of R shoulder abduction THERAPEUTIC ACTIVITY: In supine: supine scapular series using red theraband x 10 reps each with pt returning therapist demo as follows: narrow and wide grip flexion, horizontal abduction, ER and diagonals with v/c for correct form and to engage scapular muscles Snow angels x10 reps MANUAL THERAPY: MFR to cording in R axilla with cording more difficult to palpate  PROM in supine in to flexion and abduction Gentle STM to seroma in inferior breast as well as MLD moving fluid towards pathway aimed at right lateral trunk towards groin  09/22/24: THERAPEUTIC EXERCISE: Pulleys x 2 min in direction of flexion and 2 min in direction of abduction Ball up wall x 10 reps in direction of flexion and 10 reps in direction of R shoulder abduction THERAPEUTIC ACTIVITY: Supine on mat table: alternating flexion x 10, bilateral scaption x 10, snow angels x 10 MANUAL THERAPY: MFR to cording in R axilla with  cording more difficult to palpate  PROM in supine in to flexion and abduction Gentle STM to seroma in inferior breast as well as MLD moving fluid towards pathway aimed at right lateral trunk towards groin   09/20/24: THERAPEUTIC EXERCISE: Pulleys x 2 min in direction of flexion and 2 min in direction of abduction Ball up wall x 10  reps in direction of flexion and 10 reps in direction of R shoulder abduction MANUAL THERAPY: MFR to cording in R axilla and upper arm with numerous cords palpable with UE in 90 degrees of abduction propped on pillows PROM in supine in to flexion and abduction Gentle STM to seroma in inferior breast as well as MLD moving fluid towards pathway aimed at right lateral trunk towards groin  09/16/24: THERAPEUTIC EXERCISE: Pulleys x 2 min in direction of flexion and 2 min in direction of abduction Ball up wall x 10 reps in direction of flexion and 10 reps in direction of R shoulder abduction THERAPEUTIC ACTIVITY: Supine over 1/2 foam roll: alternating flexion x 10, bilateral scaption x 10, snow angels (very limited motion due to tightness) Supine over 1/2 foam roll: supine scapular series and had her return demo 10 reps of each as follows with yellow band while therapist provided appropriate v/c and t/c: narrow and wide grip flexion, horizontal abduction, ER, bilateral diagonals MANUAL THERAPY: MFR to cording in R axilla and upper arm with numerous cords palpable with UE in 90 degrees with cording extending past antecubital fossa PROM in supine in to flexion and abduction  09/14/24: THERAPEUTIC EXERCISE: Pulleys x 2 min in direction of flexion and 2 min in direction of abduction Ball up wall x 10 reps in direction of flexion and 10 reps in direction of R shoulder abduction THERAPEUTIC ACTIVITY: Supine over 1/2 foam roll: alternating flexion x 10, bilateral scaption x 10, snow angels (very limited motion due to tightness) Supine over 1/2 foam roll: supine scapular  series and had her return demo 10 reps of each as follows with yellow band while therapist provided appropriate v/c and t/c: narrow and wide grip flexion, horizontal abduction, ER, bilateral diagonals MANUAL THERAPY: MFR to cording in R axilla with numerous cords palpable with UE in 90 degrees of abduction PROM in supine in to flexion, abduction and ER to pt's tolerance with v/c and t/c to keep shoulder relaxed Gentle STM to seroma in inferior breast as well as MLD moving fluid towards pathway aimed at right lateral trunk towards groin  09/09/24: THERAPEUTIC EXERCISE: Pulleys x 2 min in direction of flexion and 2 min in direction of abduction Ball up wall x 10 reps in direction of flexion and 10 reps in direction of R shoulder abduction Instructed pt in supine scapular series and had her return demo 10 reps of each as follows with yellow band while therapist provided appropriate v/c and t/c: narrow and wide grip flexion, horizontal abduction, ER, bilateral diagonals MANUAL THERAPY: MFR to cording in R axilla with numerous cords palpable with UE in 120 degrees of abduction support on pillow with elbow bent PROM in supine in to flexion, abduction and ER to pt's tolerance  09/07/24: THERAPEUTIC EXERCISE: Pulleys x 2 min in direction of flexion and 2 min in direction of abduction Ball up wall x 10 reps in direction of flexion and 10 reps in direction of R shoulder abduction MANUAL THERAPY: MFR to cording in R axilla with numerous cords palpable with UE in 120 degrees of abduction support on pillow with elbow bent Gentle STM to area of fibrosis/seroma in R breast - educated pt to let her radiation dr know about this to be sure it will not interfere with radation  PATIENT EDUCATION:  Education details: ABC class, need for compression bra, axillary cording, seroma vs scar tissue, need for stretching for cording, side bending QL stretch Person educated: Patient Education  method: Explanation,  Demonstration, and Handouts Education comprehension: verbalized understanding  HOME EXERCISE PROGRAM: Reviewed previously given post op HEP. Wear compression bra Quadratus lumborum stretch 1-2x/daily holding for 30-60 seconds  ASSESSMENT:  CLINICAL IMPRESSION: Pt has now met all goals for therapy. Continued with MLD and instructed pt to continue this at home as well as soft tissue mobilization for area of fibrosis in inferior breast. Her pain has improved greatly from eval and now she only has intermittent tightness at end range in her RUE. She is planning on going to exercises classes 4 days a week. Her ldex screening was normal. She will be discharged from skilled PT services but will continue ldex screenings every 3 months for 2 years post op.   Pt will benefit from skilled therapeutic intervention to improve on the following deficits: Decreased knowledge of precautions, impaired UE functional use, pain, decreased ROM, postural dysfunction.   PT treatment/interventions: ADL/Self care home management, [phone]- PT Re-evaluation, 97110-Therapeutic exercises, 97530- Therapeutic activity, [id number]- Neuromuscular re-education, 97535- Self Care, [id number]- Manual therapy, [phone]- Orthotic Initial, [phone]- Orthotic/Prosthetic subsequent, and Patient/Family education   GOALS: Goals reviewed with patient? Yes  GOALS MET AT EVAL:  GOALS Name Target Date Goal status  1 Pt will be able to verbalize understanding of pertinent lymphedema risk reduction practices relevant to her dx specifically related to skin care.  Baseline:  No knowledge Eval Achieved at eval  2 Pt will be able to return demo and/or verbalize understanding of the post op HEP related to regaining shoulder ROM. Baseline:  No knowledge Eval Achieved at eval  3 Pt will be able to verbalize understanding of the importance of viewing the post op After Breast CA Class video for further lymphedema risk reduction education and therapeutic exercise.   Baseline:  No knowledge Eval Achieved at eval   LONG TERM GOALS:  (STG=LTG)  GOALS Name Target Date  Goal status  1 Pt will demonstrate she has regained full shoulder ROM and function post operatively compared to baselines.  Baseline: 09/29/24 MET 10/26/24  2 Pt will demonstrate 155 degrees of R shoulder abduction to allow her to reach out to the side. 09/29/24 MET 10/26/24 175 degrees  3 Pt will report a 75% improvement in L flank pain to allow improved comfort and mobility. 09/29/24 MET 10/26/24 - no longer having pain  4 Pt will report 75% improvement in pain from cording with R shoulder abduction to allow improved comfort.  09/29/24 MET 10/26/24 80% improvement  5 Pt will be independent in a home exercise program for continued stretching and strengthening.  09/29/24 MET 12/02/24  6 Pt will have no pain in R axilla at end range to allow improved comfort. 11/23/24 MET 12/02/24- has some tightness/pulling intermittently at end range but no pain     PLAN:  PT FREQUENCY/DURATION: 1x/wk for 4 weeks started week of Dec 29 due to holidays  PLAN FOR NEXT SESSION: d/c this session, continue l dex screenings every 3 months for 2 years post op   [facility]  [address], [address]  [place] [address]  [phone]   Florina Sever [place], [place] 12/02/2024, 12:07 PM  PHYSICAL THERAPY DISCHARGE SUMMARY  Visits from Start of Care: 18  Current functional level related to goals / functional outcomes: All goals met   Remaining deficits: None   Education / Equipment: HEP, lymphedema education, self MLD, posture   Patient agrees to discharge. Patient goals were met. Patient is being discharged due to  meeting the stated rehab goals.  Florina Sever [place], [place] 12/02/24 12:07 PM   "

## 2024-12-06 ENCOUNTER — Ambulatory Visit

## 2024-12-14 ENCOUNTER — Ambulatory Visit: Admitting: Podiatry

## 2024-12-17 ENCOUNTER — Encounter: Payer: Self-pay | Admitting: *Deleted

## 2024-12-17 LAB — GLUCOSE, POCT (MANUAL RESULT ENTRY): Glucose Fasting, POC: 127 mg/dL — AB (ref 70–99)

## 2024-12-17 NOTE — Progress Notes (Signed)
 Pt. Attended the women's heart event on 12/17/2024. Pt BP is 134/80 second reading,  fasting blood glucose is 127, Pt declined having any SDOH insecurity. Pt documented no for tobacco use. Pt document pcp as Dr. Annabella Scarce and pt document private and Medicare for insurance.

## 2024-12-21 ENCOUNTER — Ambulatory Visit: Admitting: Podiatry

## 2025-01-17 ENCOUNTER — Inpatient Hospital Stay: Attending: Internal Medicine | Admitting: Adult Health

## 2025-01-17 ENCOUNTER — Inpatient Hospital Stay

## 2025-02-10 ENCOUNTER — Ambulatory Visit: Admitting: Dermatology

## 2025-02-21 ENCOUNTER — Ambulatory Visit: Attending: General Surgery

## 2025-09-22 ENCOUNTER — Encounter: Admitting: Obstetrics and Gynecology
